# Patient Record
Sex: Female | Born: 1935 | Race: Black or African American | Hispanic: No | Smoking: Former smoker
Health system: Southern US, Community
[De-identification: ages and names within clinical notes are randomized; demographics above are authoritative.]

## PROBLEM LIST (undated history)

## (undated) DIAGNOSIS — I10 Essential (primary) hypertension: Secondary | ICD-10-CM

## (undated) DIAGNOSIS — J449 Chronic obstructive pulmonary disease, unspecified: Secondary | ICD-10-CM

## (undated) DIAGNOSIS — I4891 Unspecified atrial fibrillation: Secondary | ICD-10-CM

## (undated) DIAGNOSIS — I509 Heart failure, unspecified: Secondary | ICD-10-CM

## (undated) DIAGNOSIS — I639 Cerebral infarction, unspecified: Secondary | ICD-10-CM

## (undated) DIAGNOSIS — I219 Acute myocardial infarction, unspecified: Secondary | ICD-10-CM

## (undated) DIAGNOSIS — K219 Gastro-esophageal reflux disease without esophagitis: Secondary | ICD-10-CM

## (undated) DIAGNOSIS — N3090 Cystitis, unspecified without hematuria: Secondary | ICD-10-CM

## (undated) DIAGNOSIS — I999 Unspecified disorder of circulatory system: Secondary | ICD-10-CM

## (undated) DIAGNOSIS — K5792 Diverticulitis of intestine, part unspecified, without perforation or abscess without bleeding: Secondary | ICD-10-CM

## (undated) DIAGNOSIS — R0602 Shortness of breath: Secondary | ICD-10-CM

## (undated) DIAGNOSIS — I82409 Acute embolism and thrombosis of unspecified deep veins of unspecified lower extremity: Secondary | ICD-10-CM

## (undated) HISTORY — PX: OTHER SURGICAL HISTORY: SHX169

## (undated) HISTORY — PX: ABDOMINAL HYSTERECTOMY: SHX81

## (undated) HISTORY — PX: FEMORAL ARTERY STENT: SHX1583

## (undated) HISTORY — PX: ABDOMINAL SURGERY: SHX537

## (undated) HISTORY — PX: EYE SURGERY: SHX253

## (undated) HISTORY — PX: COLOSTOMY: SHX63

## (undated) HISTORY — PX: CATARACT EXTRACTION, BILATERAL: SHX1313

---

## 1997-09-06 ENCOUNTER — Other Ambulatory Visit: Admission: RE | Admit: 1997-09-06 | Discharge: 1997-09-06 | Payer: Self-pay | Admitting: Nephrology

## 1997-09-13 ENCOUNTER — Other Ambulatory Visit: Admission: RE | Admit: 1997-09-13 | Discharge: 1997-09-13 | Payer: Self-pay | Admitting: Nephrology

## 1997-10-02 ENCOUNTER — Other Ambulatory Visit: Admission: RE | Admit: 1997-10-02 | Discharge: 1997-10-02 | Payer: Self-pay | Admitting: Nephrology

## 1997-11-08 ENCOUNTER — Other Ambulatory Visit: Admission: RE | Admit: 1997-11-08 | Discharge: 1997-11-08 | Payer: Self-pay | Admitting: Internal Medicine

## 1998-11-26 ENCOUNTER — Other Ambulatory Visit: Admission: RE | Admit: 1998-11-26 | Discharge: 1998-11-26 | Payer: Self-pay | Admitting: Internal Medicine

## 1999-11-28 ENCOUNTER — Emergency Department (HOSPITAL_COMMUNITY): Admission: EM | Admit: 1999-11-28 | Discharge: 1999-11-28 | Payer: Self-pay | Admitting: Emergency Medicine

## 1999-11-28 ENCOUNTER — Encounter: Payer: Self-pay | Admitting: Emergency Medicine

## 1999-12-05 ENCOUNTER — Emergency Department (HOSPITAL_COMMUNITY): Admission: EM | Admit: 1999-12-05 | Discharge: 1999-12-05 | Payer: Self-pay | Admitting: Emergency Medicine

## 1999-12-05 ENCOUNTER — Encounter: Payer: Self-pay | Admitting: Emergency Medicine

## 1999-12-15 ENCOUNTER — Encounter: Payer: Self-pay | Admitting: Cardiovascular Disease

## 1999-12-15 ENCOUNTER — Ambulatory Visit (HOSPITAL_COMMUNITY): Admission: RE | Admit: 1999-12-15 | Discharge: 1999-12-15 | Payer: Self-pay | Admitting: Cardiovascular Disease

## 2000-02-25 ENCOUNTER — Encounter: Payer: Self-pay | Admitting: Internal Medicine

## 2000-02-25 ENCOUNTER — Encounter: Admission: RE | Admit: 2000-02-25 | Discharge: 2000-02-25 | Payer: Self-pay | Admitting: Internal Medicine

## 2000-03-31 ENCOUNTER — Other Ambulatory Visit: Admission: RE | Admit: 2000-03-31 | Discharge: 2000-03-31 | Payer: Self-pay | Admitting: Internal Medicine

## 2000-04-24 ENCOUNTER — Encounter: Payer: Self-pay | Admitting: Emergency Medicine

## 2000-04-24 ENCOUNTER — Inpatient Hospital Stay (HOSPITAL_COMMUNITY): Admission: EM | Admit: 2000-04-24 | Discharge: 2000-04-29 | Payer: Self-pay | Admitting: Emergency Medicine

## 2000-04-26 ENCOUNTER — Encounter: Payer: Self-pay | Admitting: Nephrology

## 2000-04-27 ENCOUNTER — Encounter: Payer: Self-pay | Admitting: Internal Medicine

## 2000-12-07 ENCOUNTER — Ambulatory Visit (HOSPITAL_COMMUNITY): Admission: RE | Admit: 2000-12-07 | Discharge: 2000-12-07 | Payer: Self-pay | Admitting: Ophthalmology

## 2001-03-01 ENCOUNTER — Encounter: Payer: Self-pay | Admitting: Internal Medicine

## 2001-03-01 ENCOUNTER — Encounter: Admission: RE | Admit: 2001-03-01 | Discharge: 2001-03-01 | Payer: Self-pay | Admitting: Internal Medicine

## 2001-05-03 ENCOUNTER — Other Ambulatory Visit: Admission: RE | Admit: 2001-05-03 | Discharge: 2001-05-03 | Payer: Self-pay | Admitting: Internal Medicine

## 2001-10-26 ENCOUNTER — Ambulatory Visit (HOSPITAL_COMMUNITY): Admission: RE | Admit: 2001-10-26 | Discharge: 2001-10-26 | Payer: Self-pay | Admitting: Cardiovascular Disease

## 2002-02-14 ENCOUNTER — Other Ambulatory Visit: Admission: RE | Admit: 2002-02-14 | Discharge: 2002-02-14 | Payer: Self-pay | Admitting: Internal Medicine

## 2002-03-06 ENCOUNTER — Encounter: Admission: RE | Admit: 2002-03-06 | Discharge: 2002-03-06 | Payer: Self-pay | Admitting: Internal Medicine

## 2002-03-06 ENCOUNTER — Encounter: Payer: Self-pay | Admitting: Internal Medicine

## 2003-03-21 ENCOUNTER — Other Ambulatory Visit: Admission: RE | Admit: 2003-03-21 | Discharge: 2003-03-21 | Payer: Self-pay | Admitting: Internal Medicine

## 2003-03-26 ENCOUNTER — Encounter: Admission: RE | Admit: 2003-03-26 | Discharge: 2003-03-26 | Payer: Self-pay | Admitting: Internal Medicine

## 2003-07-08 ENCOUNTER — Emergency Department (HOSPITAL_COMMUNITY): Admission: EM | Admit: 2003-07-08 | Discharge: 2003-07-08 | Payer: Self-pay | Admitting: *Deleted

## 2004-03-05 ENCOUNTER — Emergency Department (HOSPITAL_COMMUNITY): Admission: EM | Admit: 2004-03-05 | Discharge: 2004-03-05 | Payer: Self-pay | Admitting: Emergency Medicine

## 2004-03-26 ENCOUNTER — Encounter: Admission: RE | Admit: 2004-03-26 | Discharge: 2004-03-26 | Payer: Self-pay | Admitting: Internal Medicine

## 2004-04-04 ENCOUNTER — Encounter: Admission: RE | Admit: 2004-04-04 | Discharge: 2004-04-04 | Payer: Self-pay | Admitting: Internal Medicine

## 2004-08-13 ENCOUNTER — Inpatient Hospital Stay (HOSPITAL_COMMUNITY): Admission: EM | Admit: 2004-08-13 | Discharge: 2004-08-15 | Payer: Self-pay | Admitting: *Deleted

## 2004-09-26 ENCOUNTER — Encounter: Admission: RE | Admit: 2004-09-26 | Discharge: 2004-09-26 | Payer: Self-pay | Admitting: Internal Medicine

## 2005-04-10 ENCOUNTER — Encounter: Admission: RE | Admit: 2005-04-10 | Discharge: 2005-04-10 | Payer: Self-pay | Admitting: Internal Medicine

## 2005-06-15 ENCOUNTER — Encounter: Admission: RE | Admit: 2005-06-15 | Discharge: 2005-06-15 | Payer: Self-pay | Admitting: Internal Medicine

## 2006-04-16 ENCOUNTER — Encounter: Admission: RE | Admit: 2006-04-16 | Discharge: 2006-04-16 | Payer: Self-pay | Admitting: Internal Medicine

## 2006-11-22 ENCOUNTER — Ambulatory Visit (HOSPITAL_COMMUNITY): Admission: RE | Admit: 2006-11-22 | Discharge: 2006-11-22 | Payer: Self-pay | Admitting: Internal Medicine

## 2007-05-31 ENCOUNTER — Encounter: Admission: RE | Admit: 2007-05-31 | Discharge: 2007-05-31 | Payer: Self-pay | Admitting: Internal Medicine

## 2007-06-09 ENCOUNTER — Inpatient Hospital Stay (HOSPITAL_COMMUNITY): Admission: EM | Admit: 2007-06-09 | Discharge: 2007-06-20 | Payer: Self-pay | Admitting: Emergency Medicine

## 2007-06-10 ENCOUNTER — Ambulatory Visit: Payer: Self-pay | Admitting: Vascular Surgery

## 2007-06-10 ENCOUNTER — Encounter: Payer: Self-pay | Admitting: Gynecology

## 2007-06-10 ENCOUNTER — Encounter: Payer: Self-pay | Admitting: Internal Medicine

## 2007-06-30 ENCOUNTER — Inpatient Hospital Stay (HOSPITAL_COMMUNITY): Admission: EM | Admit: 2007-06-30 | Discharge: 2007-07-11 | Payer: Self-pay | Admitting: Emergency Medicine

## 2007-06-30 ENCOUNTER — Ambulatory Visit: Payer: Self-pay | Admitting: Oncology

## 2007-07-05 ENCOUNTER — Encounter: Payer: Self-pay | Admitting: Internal Medicine

## 2007-07-22 ENCOUNTER — Ambulatory Visit: Payer: Self-pay | Admitting: Oncology

## 2007-08-19 LAB — CBC WITH DIFFERENTIAL/PLATELET
Basophils Absolute: 0 10*3/uL (ref 0.0–0.1)
Eosinophils Absolute: 0.1 10*3/uL (ref 0.0–0.5)
HCT: 36.2 % (ref 34.8–46.6)
HGB: 12.1 g/dL (ref 11.6–15.9)
LYMPH%: 24.5 % (ref 14.0–48.0)
MCH: 28.5 pg (ref 26.0–34.0)
MCV: 85.7 fL (ref 81.0–101.0)
MONO%: 6.8 % (ref 0.0–13.0)
NEUT#: 4.5 10*3/uL (ref 1.5–6.5)
NEUT%: 66.7 % (ref 39.6–76.8)
Platelets: 222 10*3/uL (ref 145–400)
RDW: 15.3 % — ABNORMAL HIGH (ref 11.3–14.5)

## 2007-08-19 LAB — PROTHROMBIN TIME: INR: 0.9 (ref 0.0–1.5)

## 2007-08-26 LAB — PROTIME-INR: INR: 1 — ABNORMAL LOW (ref 2.00–3.50)

## 2007-09-02 ENCOUNTER — Ambulatory Visit: Payer: Self-pay | Admitting: Oncology

## 2007-09-02 LAB — CBC WITH DIFFERENTIAL/PLATELET
BASO%: 1 % (ref 0.0–2.0)
Basophils Absolute: 0.1 10*3/uL (ref 0.0–0.1)
EOS%: 3 % (ref 0.0–7.0)
HGB: 12.9 g/dL (ref 11.6–15.9)
MCH: 29.1 pg (ref 26.0–34.0)
MONO#: 0.7 10*3/uL (ref 0.1–0.9)
RDW: 14.5 % (ref 11.3–14.5)
WBC: 8.2 10*3/uL (ref 3.9–10.0)
lymph#: 2.4 10*3/uL (ref 0.9–3.3)

## 2007-09-02 LAB — PROTIME-INR

## 2007-09-12 LAB — PROTIME-INR: Protime: 19.2 Seconds — ABNORMAL HIGH (ref 10.6–13.4)

## 2007-09-16 LAB — PROTIME-INR
INR: 2.3 (ref 2.00–3.50)
Protime: 27.6 Seconds — ABNORMAL HIGH (ref 10.6–13.4)

## 2007-09-20 LAB — PROTIME-INR
INR: 3.2 (ref 2.00–3.50)
Protime: 38.4 Seconds — ABNORMAL HIGH (ref 10.6–13.4)

## 2007-10-14 ENCOUNTER — Ambulatory Visit: Payer: Self-pay | Admitting: Oncology

## 2007-11-21 LAB — CBC WITH DIFFERENTIAL/PLATELET
BASO%: 1.4 % (ref 0.0–2.0)
EOS%: 2.6 % (ref 0.0–7.0)
HCT: 38.1 % (ref 34.8–46.6)
LYMPH%: 28 % (ref 14.0–48.0)
MCH: 27.8 pg (ref 26.0–34.0)
MCHC: 33.4 g/dL (ref 32.0–36.0)
MCV: 83.2 fL (ref 81.0–101.0)
MONO%: 7.2 % (ref 0.0–13.0)
NEUT%: 60.8 % (ref 39.6–76.8)
Platelets: 184 10*3/uL (ref 145–400)

## 2007-12-20 ENCOUNTER — Ambulatory Visit: Payer: Self-pay | Admitting: Oncology

## 2007-12-22 LAB — PROTIME-INR: INR: 2.1 (ref 2.00–3.50)

## 2008-01-24 LAB — PROTIME-INR: Protime: 16.8 Seconds — ABNORMAL HIGH (ref 10.6–13.4)

## 2008-02-07 ENCOUNTER — Ambulatory Visit: Payer: Self-pay | Admitting: Oncology

## 2008-02-07 LAB — PROTIME-INR
INR: 1.6 — ABNORMAL LOW (ref 2.00–3.50)
Protime: 19.2 Seconds — ABNORMAL HIGH (ref 10.6–13.4)

## 2008-02-14 LAB — PROTIME-INR
INR: 1.7 — ABNORMAL LOW (ref 2.00–3.50)
Protime: 20.4 s — ABNORMAL HIGH (ref 10.6–13.4)

## 2008-02-28 LAB — CBC WITH DIFFERENTIAL/PLATELET
Basophils Absolute: 0.1 10*3/uL (ref 0.0–0.1)
Eosinophils Absolute: 0.2 10*3/uL (ref 0.0–0.5)
HCT: 36.5 % (ref 34.8–46.6)
HGB: 12.1 g/dL (ref 11.6–15.9)
LYMPH%: 29 % (ref 14.0–48.0)
MCH: 26.9 pg (ref 26.0–34.0)
MCV: 80.8 fL — ABNORMAL LOW (ref 81.0–101.0)
MONO%: 8.6 % (ref 0.0–13.0)
NEUT#: 4.3 10*3/uL (ref 1.5–6.5)
NEUT%: 58.4 % (ref 39.6–76.8)
Platelets: 167 10*3/uL (ref 145–400)
RDW: 14.1 % (ref 11.3–14.5)

## 2008-02-28 LAB — PROTIME-INR: INR: 2.8 (ref 2.00–3.50)

## 2008-03-13 LAB — PROTIME-INR: INR: 3.4 (ref 2.00–3.50)

## 2008-04-06 ENCOUNTER — Ambulatory Visit: Payer: Self-pay | Admitting: Oncology

## 2008-04-10 LAB — CBC WITH DIFFERENTIAL/PLATELET
Basophils Absolute: 0.1 10*3/uL (ref 0.0–0.1)
EOS%: 2.6 % (ref 0.0–7.0)
Eosinophils Absolute: 0.2 10*3/uL (ref 0.0–0.5)
HGB: 12 g/dL (ref 11.6–15.9)
LYMPH%: 26.1 % (ref 14.0–48.0)
MCH: 26.8 pg (ref 26.0–34.0)
MCV: 80.8 fL — ABNORMAL LOW (ref 81.0–101.0)
MONO%: 7.3 % (ref 0.0–13.0)
NEUT#: 4.8 10*3/uL (ref 1.5–6.5)
Platelets: 189 10*3/uL (ref 145–400)

## 2008-05-17 ENCOUNTER — Ambulatory Visit: Payer: Self-pay | Admitting: Oncology

## 2008-05-22 LAB — CBC WITH DIFFERENTIAL/PLATELET
Basophils Absolute: 0 10*3/uL (ref 0.0–0.1)
EOS%: 2.7 % (ref 0.0–7.0)
Eosinophils Absolute: 0.2 10*3/uL (ref 0.0–0.5)
HCT: 35 % (ref 34.8–46.6)
MCH: 27.3 pg (ref 26.0–34.0)
MCV: 83.3 fL (ref 81.0–101.0)
MONO%: 7.2 % (ref 0.0–13.0)
NEUT%: 59.1 % (ref 39.6–76.8)
RDW: 16.4 % — ABNORMAL HIGH (ref 11.3–14.5)
WBC: 6.7 10*3/uL (ref 3.9–10.0)
lymph#: 2 10*3/uL (ref 0.9–3.3)

## 2008-05-22 LAB — PROTIME-INR: Protime: 44.4 Seconds — ABNORMAL HIGH (ref 10.6–13.4)

## 2008-06-28 ENCOUNTER — Encounter: Admission: RE | Admit: 2008-06-28 | Discharge: 2008-06-28 | Payer: Self-pay | Admitting: Internal Medicine

## 2008-07-06 ENCOUNTER — Ambulatory Visit: Payer: Self-pay | Admitting: Oncology

## 2008-07-10 LAB — CBC WITH DIFFERENTIAL/PLATELET
BASO%: 0.6 % (ref 0.0–2.0)
LYMPH%: 27.7 % (ref 14.0–49.7)
MCHC: 33.1 g/dL (ref 31.5–36.0)
MONO#: 0.4 10*3/uL (ref 0.1–0.9)
RBC: 4.49 10*6/uL (ref 3.70–5.45)
RDW: 16.1 % — ABNORMAL HIGH (ref 11.2–14.5)
WBC: 6.8 10*3/uL (ref 3.9–10.3)
lymph#: 1.9 10*3/uL (ref 0.9–3.3)

## 2008-07-10 LAB — PROTIME-INR
INR: 2 (ref 2.00–3.50)
Protime: 24 Seconds — ABNORMAL HIGH (ref 10.6–13.4)

## 2008-08-13 ENCOUNTER — Ambulatory Visit (HOSPITAL_COMMUNITY): Admission: RE | Admit: 2008-08-13 | Discharge: 2008-08-13 | Payer: Self-pay | Admitting: Internal Medicine

## 2008-08-15 LAB — PROTIME-INR

## 2008-08-31 ENCOUNTER — Ambulatory Visit: Payer: Self-pay | Admitting: Oncology

## 2008-09-04 LAB — CBC WITH DIFFERENTIAL/PLATELET
EOS%: 2.3 % (ref 0.0–7.0)
MCH: 27.5 pg (ref 25.1–34.0)
MCV: 84.1 fL (ref 79.5–101.0)
MONO%: 7.6 % (ref 0.0–14.0)
NEUT#: 4.7 10*3/uL (ref 1.5–6.5)
RBC: 4.66 10*6/uL (ref 3.70–5.45)
RDW: 15.3 % — ABNORMAL HIGH (ref 11.2–14.5)

## 2008-09-04 LAB — PROTIME-INR
INR: 2.5 (ref 2.00–3.50)
Protime: 30 Seconds — ABNORMAL HIGH (ref 10.6–13.4)

## 2008-09-12 IMAGING — CR DG CHEST 2V
1 series · 1 of 1 positions shown · non-contrast
Comparison: 06/09/07.

CLINICAL DATA: Dyspnea.  Weakness.  
 CHEST - 2 VIEW:

[view not recorded]
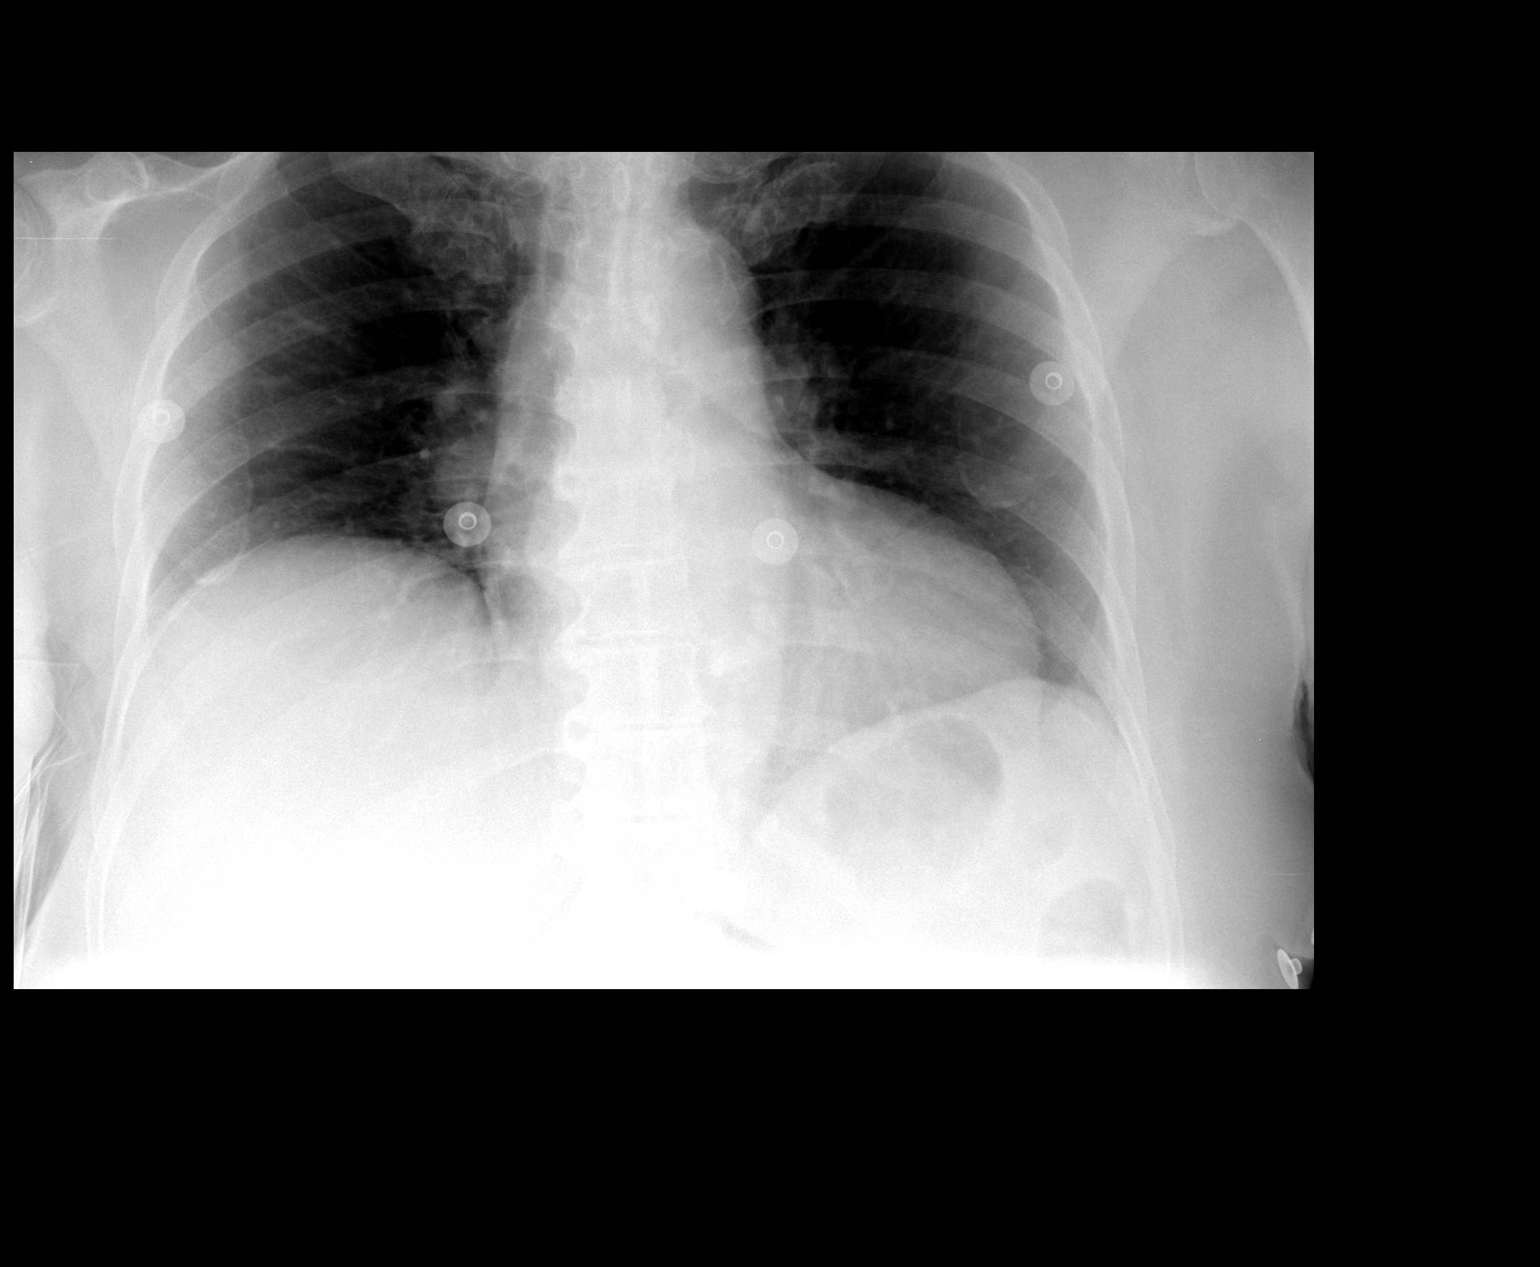

[1 of 1 positions shown; findings below may reference images not displayed]

FINDINGS: Mild cardiomegaly is again identified.  Elevation of the right hemidiaphragm and mild peribronchial thickening are stable.  There is no evidence of focal air space disease, pleural effusions, or pneumothorax.  Mild thoracic spondylosis is again identified.
IMPRESSION: No evidence of acute cardiopulmonary disease.

## 2008-11-07 ENCOUNTER — Other Ambulatory Visit: Admission: RE | Admit: 2008-11-07 | Discharge: 2008-11-07 | Payer: Self-pay | Admitting: Internal Medicine

## 2009-01-31 ENCOUNTER — Inpatient Hospital Stay (HOSPITAL_COMMUNITY): Admission: EM | Admit: 2009-01-31 | Discharge: 2009-02-01 | Payer: Self-pay | Admitting: Cardiovascular Disease

## 2009-04-12 ENCOUNTER — Ambulatory Visit: Payer: Self-pay | Admitting: Cardiovascular Disease

## 2009-04-12 ENCOUNTER — Inpatient Hospital Stay (HOSPITAL_COMMUNITY): Admission: EM | Admit: 2009-04-12 | Discharge: 2009-04-19 | Payer: Self-pay | Admitting: Emergency Medicine

## 2009-04-12 ENCOUNTER — Encounter (INDEPENDENT_AMBULATORY_CARE_PROVIDER_SITE_OTHER): Payer: Self-pay | Admitting: Internal Medicine

## 2009-04-30 ENCOUNTER — Inpatient Hospital Stay (HOSPITAL_COMMUNITY): Admission: EM | Admit: 2009-04-30 | Discharge: 2009-05-08 | Payer: Self-pay | Admitting: Emergency Medicine

## 2009-05-03 ENCOUNTER — Ambulatory Visit: Payer: Self-pay | Admitting: Thoracic Surgery (Cardiothoracic Vascular Surgery)

## 2009-06-04 ENCOUNTER — Encounter: Admission: RE | Admit: 2009-06-04 | Discharge: 2009-06-04 | Payer: Self-pay | Admitting: Cardiovascular Disease

## 2009-07-02 ENCOUNTER — Encounter (INDEPENDENT_AMBULATORY_CARE_PROVIDER_SITE_OTHER): Payer: Self-pay | Admitting: Cardiovascular Disease

## 2009-07-02 ENCOUNTER — Inpatient Hospital Stay (HOSPITAL_COMMUNITY): Admission: RE | Admit: 2009-07-02 | Discharge: 2009-07-10 | Payer: Self-pay | Admitting: Cardiovascular Disease

## 2009-07-02 ENCOUNTER — Ambulatory Visit: Payer: Self-pay | Admitting: Vascular Surgery

## 2009-07-13 ENCOUNTER — Inpatient Hospital Stay (HOSPITAL_COMMUNITY): Admission: EM | Admit: 2009-07-13 | Discharge: 2009-07-20 | Payer: Self-pay | Admitting: Emergency Medicine

## 2009-07-16 ENCOUNTER — Ambulatory Visit: Payer: Self-pay | Admitting: Surgery

## 2009-08-09 ENCOUNTER — Ambulatory Visit: Payer: Self-pay | Admitting: Oncology

## 2009-08-23 LAB — LACTATE DEHYDROGENASE: LDH: 160 U/L (ref 94–250)

## 2009-08-23 LAB — CBC WITH DIFFERENTIAL/PLATELET
BASO%: 0.4 % (ref 0.0–2.0)
EOS%: 3.5 % (ref 0.0–7.0)
HCT: 38.4 % (ref 34.8–46.6)
MCH: 28.4 pg (ref 25.1–34.0)
MCHC: 32.7 g/dL (ref 31.5–36.0)
MONO#: 0.7 10*3/uL (ref 0.1–0.9)
NEUT%: 73.1 % (ref 38.4–76.8)
RBC: 4.42 10*6/uL (ref 3.70–5.45)
WBC: 9.1 10*3/uL (ref 3.9–10.3)
lymph#: 1.4 10*3/uL (ref 0.9–3.3)

## 2009-08-23 LAB — COMPREHENSIVE METABOLIC PANEL
ALT: 22 U/L (ref 0–35)
Alkaline Phosphatase: 57 U/L (ref 39–117)
CO2: 24 mEq/L (ref 19–32)
Creatinine, Ser: 1.7 mg/dL — ABNORMAL HIGH (ref 0.40–1.20)
Glucose, Bld: 338 mg/dL — ABNORMAL HIGH (ref 70–99)
Total Bilirubin: 0.5 mg/dL (ref 0.3–1.2)

## 2009-08-23 LAB — PROTIME-INR: Protime: 12 Seconds (ref 10.6–13.4)

## 2009-10-06 ENCOUNTER — Inpatient Hospital Stay (HOSPITAL_COMMUNITY): Admission: EM | Admit: 2009-10-06 | Discharge: 2009-10-10 | Payer: Self-pay | Admitting: Emergency Medicine

## 2010-01-16 ENCOUNTER — Ambulatory Visit (HOSPITAL_COMMUNITY): Admission: RE | Admit: 2010-01-16 | Discharge: 2010-01-16 | Payer: Self-pay | Admitting: Internal Medicine

## 2010-02-03 ENCOUNTER — Inpatient Hospital Stay (HOSPITAL_COMMUNITY): Admission: EM | Admit: 2010-02-03 | Discharge: 2010-02-07 | Payer: Self-pay | Admitting: Emergency Medicine

## 2010-02-20 ENCOUNTER — Ambulatory Visit: Payer: Self-pay | Admitting: Oncology

## 2010-05-06 ENCOUNTER — Inpatient Hospital Stay (HOSPITAL_COMMUNITY)
Admission: EM | Admit: 2010-05-06 | Discharge: 2010-05-10 | Payer: Self-pay | Source: Home / Self Care | Attending: Internal Medicine | Admitting: Internal Medicine

## 2010-05-07 ENCOUNTER — Encounter (INDEPENDENT_AMBULATORY_CARE_PROVIDER_SITE_OTHER): Payer: Self-pay | Admitting: Internal Medicine

## 2010-06-10 ENCOUNTER — Emergency Department (HOSPITAL_COMMUNITY)
Admission: EM | Admit: 2010-06-10 | Discharge: 2010-06-10 | Payer: Self-pay | Source: Home / Self Care | Admitting: Emergency Medicine

## 2010-06-11 LAB — CBC
HCT: 26.7 % — ABNORMAL LOW (ref 36.0–46.0)
MCH: 26 pg (ref 26.0–34.0)
MCV: 85.9 fL (ref 78.0–100.0)
RBC: 3.11 MIL/uL — ABNORMAL LOW (ref 3.87–5.11)
RDW: 17.8 % — ABNORMAL HIGH (ref 11.5–15.5)
WBC: 8 10*3/uL (ref 4.0–10.5)

## 2010-06-11 LAB — URINALYSIS, ROUTINE W REFLEX MICROSCOPIC
Bilirubin Urine: NEGATIVE
Hgb urine dipstick: NEGATIVE
Ketones, ur: NEGATIVE mg/dL
Protein, ur: NEGATIVE mg/dL
Urine Glucose, Fasting: NEGATIVE mg/dL
Urobilinogen, UA: 0.2 mg/dL (ref 0.0–1.0)

## 2010-06-11 LAB — POCT CARDIAC MARKERS: Troponin i, poc: <0.05 ng/mL (ref 0.00–0.09)

## 2010-06-11 LAB — POCT I-STAT, CHEM 8
BUN: 43 mg/dL — ABNORMAL HIGH (ref 6–23)
Calcium, Ion: 1.06 mmol/L — ABNORMAL LOW (ref 1.12–1.32)
Chloride: 105 mEq/L (ref 96–112)
Glucose, Bld: 225 mg/dL — ABNORMAL HIGH (ref 70–99)
HCT: 27 % — ABNORMAL LOW (ref 36.0–46.0)
Potassium: 3.8 mEq/L (ref 3.5–5.1)

## 2010-06-11 LAB — URINE MICROSCOPIC-ADD ON

## 2010-06-11 LAB — DIFFERENTIAL
Eosinophils Relative: 6 % — ABNORMAL HIGH (ref 0–5)
Lymphocytes Relative: 10 % — ABNORMAL LOW (ref 12–46)
Lymphs Abs: 0.8 10*3/uL (ref 0.7–4.0)
Monocytes Relative: 7 % (ref 3–12)

## 2010-06-14 NOTE — H&P (Signed)
[name], [name]               ACCOUNT NO.:  [id number]  MEDICAL RECORD NO.:  [id number]          PATIENT TYPE:  INP  LOCATION:  1823                         FACILITY:  [facility]  PHYSICIAN:  Michiel Cowboy, MDDATE OF BIRTH:  12/08/1925  DATE OF ADMISSION:  05/06/2010 DATE OF DISCHARGE:                             HISTORY & PHYSICAL   PRIMARY CARE PHYSICIAN:  Dr. Vonita Moss.  CHIEF COMPLAINT:  Shortness of breath.  The patient is an 77 year old female with past medical history significant for recent hospitalization for heart failure and community- acquired pneumonia.  In September, she was discharged to home and was doing fairly well up until a couple of weeks ago when she started to have progressive worsening shortness of breath.  Her family admits that since Thanksgiving, she has been having a lot of dietary indiscretions and has been basically eating salt fairly freely.  She denies any chest pain.  She did have an occasional episode of neck pain, but she attributed to holding her head crooked to watch TV.  Her shortness of breath has become progressively worse to the point where she had a hard time communicating and finishing sentences, at which point she was brought into the emergency department, put on BiPAP, Nitro drip, and given IV Lasix, after which her breathing has improved.  She is currently more comfortable when she is resting; but when she is speaking, she is still somewhat short of breath.  REVIEW OF SYSTEMS:  No fevers, no chills.  No nausea, no vomiting.  No diarrhea.  No constipation.  Her lower extremity has been swelling a little bit but not severely.  Otherwise review of systems is negative.  PAST MEDICAL HISTORY: 1. Significant for poorly controlled diabetes. 2. Hypertension. 3. Heart failure with ejection fraction of 35% to 40%. 4. History of coronary artery disease. 5. History of DVT, now status post IVC filter. 6. History of gout. 7. Chronic  kidney insufficiency, baseline creatinine around 0.3.  She     had not been taking ACE inhibitor because of this.  SOCIAL HISTORY:  The patient does not smoke or drink and does not abuse drugs.  Quit smoking when she was 77 years old.  FAMILY HISTORY:  Noncontributory.  ALLERGIES:  PROTON PUMP INHIBITORS.  MEDICATIONS: 1. Allopurinol 100 mg daily. 2. Calcium, magnesium, zinc over-the-counter daily. 3. __________  mg daily. 4. Co-enzyme Q10 100 mg daily. 5. Combivent inhaler 2 puffs every 6 hours as needed. 6. Digoxin 0.125 mg every other day. 7. Famotidine 20 mg twice a day. 8. Geritol, which is iron and vitamin B complex 1 tablet daily. 9. Humulin 50/50.  She takes about 50 units at night and sometimes a     high dose in the morning, depending on what her sugars are like.     She takes anywhere between 50 to 70 units in the morning. 10.I caps over-the-counter 1 tablet twice daily. 11.Iron over-the-counter. 12.Lasix 40 mg twice a day.  She had been taking it regularly. 13.Lipitor 20 mg a day. 14.Metoprolol 25 mg daily. 15.__________ Over-the-counter at bedtime. 16.Omega-3 acid 1 capsule daily. 17.Tylenol as needed for  pain. 18.Aspirin 81 mg daily.  PHYSICAL EXAMINATION:  VITALS:  Temperature 98.6, blood pressure initially 165/70, now down to 135/80.  Pulse initially 114, now down to 87, respirations 18, saturating 89% on room air, and now 100% on 40% FIO2 by BiPAP. GENERAL:  The patient appears to be in no acute distress, resting comfortably. HEENT:  Head is nontraumatic.  Moist mucous membranes. LUNGS:  Crackles but mild at the bases bilaterally. HEART:  Regular rate and rhythm.  No murmurs appreciated. ABDOMEN:  Slightly distended but nontender, slightly obese. LOWER EXTREMITIES:  1+ edema bilaterally. NEUROLOGICALLY:  Grossly intact but not fully cooperative to exam. SKIN:  Clean, dry, intact.  LABS:  White blood cell count 9.1, hemoglobin 8.7, sodium 133,  potassium 4.4, creatinine 1.42, albumin 2.6.  Cardiac enzymes, myoglobin slightly low at [id number] but troponin within normal limits.  BNP elevated 793. Digoxin level 0.5.  UA unremarkable.  Chest x-ray showing early edema. ABG 7.446/37.3/48.  EKG showing sinus tachycardia, left axis deviation, but otherwise no significant changes from prior EKGs.  ASSESSMENT/PLAN:  This is an 77 year old female with likely congestive heart failure exacerbation progressive, secondary to dietary indiscretion. 1. Congestive heart failure.  Will admit, give IV Lasix.  Her chest x-     ray does not seem to show any severe congestive heart failure at     this point.  Will follow her clinically.  Since she did not have an     echocardiogram for a couple of years, will repeat to see if there     is worsening heart function or worsening mitral valve disease, as     she has a history of mitral valve stenosis.  Will cycle cardiac     enzymes and repeat serial EKGs.  For right now, rest on the BiPAP     until morning when can wean her off but will titrate off     nitroglycerin as her blood pressure has been going down. 2. Chronic kidney disease, stable.  In the past, the patient was not     taking ACE inhibitor because of chronic kidney disease. 3. Anemia.  Will follow.  At the time of discharge in September, her     hemoglobin was at the same level of 8.6; but back at the beginning     of her admission, it was 10.2, so she is probably somewhere around     her baseline currently. 4. Diabetes.  Will put her on Lantus while in house.  Will write for     diabetic coordinator for sliding scale insulin. 5. Prophylaxis, famotidine and Lovenox. 6. Shortness of breath likely explained by congestive heart failure.     The patient has a history of IVC filter, although this does not     completely exclude a pulmonary embolus, but she does not have a one-     sided swelling.  Her symptoms also were not sudden in onset but      rather gradual which makes pulmonary embolus somewhat less likely     as the cause of her shortness of breath.  We will also evaluate for     unstable angina.  CODE STATUS:  Do not resuscitate, do not intubate, according to patient's wishes, confirmed with her family.     Michiel Cowboy, MD     AVD/MEDQ  D:  05/06/2010  T:  05/07/2010  Job:  [id number]  cc:   Dr. Vonita Moss  Electronically Signed by Therisa Doyne MD on  06/14/2010 06:11:35 AM

## 2010-07-03 ENCOUNTER — Inpatient Hospital Stay (HOSPITAL_COMMUNITY): Payer: Medicare Other

## 2010-07-03 ENCOUNTER — Emergency Department (HOSPITAL_COMMUNITY): Payer: Medicare Other

## 2010-07-03 ENCOUNTER — Inpatient Hospital Stay (HOSPITAL_COMMUNITY)
Admission: EM | Admit: 2010-07-03 | Discharge: 2010-07-11 | DRG: 280 | Disposition: A | Discharge status: Home Health | Payer: Medicare Other | Source: Ambulatory Visit | Attending: Cardiovascular Disease | Admitting: Cardiovascular Disease

## 2010-07-03 DIAGNOSIS — Z87891 Personal history of nicotine dependence: Secondary | ICD-10-CM

## 2010-07-03 DIAGNOSIS — K5900 Constipation, unspecified: Secondary | ICD-10-CM | POA: Diagnosis not present

## 2010-07-03 DIAGNOSIS — Z8673 Personal history of transient ischemic attack (TIA), and cerebral infarction without residual deficits: Secondary | ICD-10-CM

## 2010-07-03 DIAGNOSIS — Z66 Do not resuscitate: Secondary | ICD-10-CM | POA: Diagnosis present

## 2010-07-03 DIAGNOSIS — N183 Chronic kidney disease, stage 3 unspecified: Secondary | ICD-10-CM | POA: Diagnosis present

## 2010-07-03 DIAGNOSIS — I059 Rheumatic mitral valve disease, unspecified: Secondary | ICD-10-CM | POA: Diagnosis present

## 2010-07-03 DIAGNOSIS — J069 Acute upper respiratory infection, unspecified: Secondary | ICD-10-CM | POA: Diagnosis present

## 2010-07-03 DIAGNOSIS — E875 Hyperkalemia: Secondary | ICD-10-CM | POA: Diagnosis present

## 2010-07-03 DIAGNOSIS — J4489 Other specified chronic obstructive pulmonary disease: Secondary | ICD-10-CM | POA: Diagnosis present

## 2010-07-03 DIAGNOSIS — J9600 Acute respiratory failure, unspecified whether with hypoxia or hypercapnia: Secondary | ICD-10-CM | POA: Diagnosis present

## 2010-07-03 DIAGNOSIS — J449 Chronic obstructive pulmonary disease, unspecified: Secondary | ICD-10-CM | POA: Diagnosis present

## 2010-07-03 DIAGNOSIS — D638 Anemia in other chronic diseases classified elsewhere: Secondary | ICD-10-CM | POA: Diagnosis present

## 2010-07-03 DIAGNOSIS — I509 Heart failure, unspecified: Secondary | ICD-10-CM | POA: Diagnosis present

## 2010-07-03 DIAGNOSIS — I129 Hypertensive chronic kidney disease with stage 1 through stage 4 chronic kidney disease, or unspecified chronic kidney disease: Secondary | ICD-10-CM | POA: Diagnosis present

## 2010-07-03 DIAGNOSIS — I5023 Acute on chronic systolic (congestive) heart failure: Principal | ICD-10-CM | POA: Diagnosis present

## 2010-07-03 DIAGNOSIS — E119 Type 2 diabetes mellitus without complications: Secondary | ICD-10-CM | POA: Diagnosis present

## 2010-07-03 DIAGNOSIS — I214 Non-ST elevation (NSTEMI) myocardial infarction: Secondary | ICD-10-CM | POA: Diagnosis present

## 2010-07-03 LAB — DIFFERENTIAL
Basophils Absolute: 0.1 10*3/uL (ref 0.0–0.1)
Basophils Absolute: 0 10*3/uL (ref 0.0–0.1)
Basophils Relative: 0 % (ref 0–1)
Lymphocytes Relative: 11 % — ABNORMAL LOW (ref 12–46)
Lymphocytes Relative: 8 % — ABNORMAL LOW (ref 12–46)
Monocytes Absolute: 0.5 10*3/uL (ref 0.1–1.0)
Monocytes Absolute: 0.9 10*3/uL (ref 0.1–1.0)
Neutro Abs: 8.3 10*3/uL — ABNORMAL HIGH (ref 1.7–7.7)
Neutro Abs: 9 10*3/uL — ABNORMAL HIGH (ref 1.7–7.7)
Neutrophils Relative %: 83 % — ABNORMAL HIGH (ref 43–77)
Neutrophils Relative %: 83 % — ABNORMAL HIGH (ref 43–77)

## 2010-07-03 LAB — BRAIN NATRIURETIC PEPTIDE: Pro B Natriuretic peptide (BNP): 131 pg/mL — ABNORMAL HIGH (ref 0.0–100.0)

## 2010-07-03 LAB — DIGOXIN LEVEL: Digoxin Level: <0.2 ng/mL — ABNORMAL LOW (ref 0.8–2.0)

## 2010-07-03 LAB — URINALYSIS, ROUTINE W REFLEX MICROSCOPIC
Bilirubin Urine: NEGATIVE
Bilirubin Urine: NEGATIVE
Hgb urine dipstick: NEGATIVE
Ketones, ur: NEGATIVE mg/dL
Nitrite: NEGATIVE
Specific Gravity, Urine: 1.011 (ref 1.005–1.030)
Specific Gravity, Urine: 1.011 (ref 1.005–1.030)
Urine Glucose, Fasting: NEGATIVE mg/dL
Urobilinogen, UA: 0.2 mg/dL (ref 0.0–1.0)
pH: 5 (ref 5.0–8.0)
pH: 5 (ref 5.0–8.0)

## 2010-07-03 LAB — CBC
HCT: 28.3 % — ABNORMAL LOW (ref 36.0–46.0)
HCT: 26.2 % — ABNORMAL LOW (ref 36.0–46.0)
Hemoglobin: 8.7 g/dL — ABNORMAL LOW (ref 12.0–15.0)
Hemoglobin: 8 g/dL — ABNORMAL LOW (ref 12.0–15.0)
MCHC: 30.7 g/dL (ref 30.0–36.0)
RBC: 3.4 MIL/uL — ABNORMAL LOW (ref 3.87–5.11)
RBC: 3.14 MIL/uL — ABNORMAL LOW (ref 3.87–5.11)
WBC: 9.9 10*3/uL (ref 4.0–10.5)
WBC: 10.8 10*3/uL — ABNORMAL HIGH (ref 4.0–10.5)

## 2010-07-03 LAB — BASIC METABOLIC PANEL
CO2: 19 mEq/L (ref 19–32)
Calcium: 8.7 mg/dL (ref 8.4–10.5)
Chloride: 94 mEq/L — ABNORMAL LOW (ref 96–112)
GFR calc Af Amer: 30 mL/min — ABNORMAL LOW (ref >60)
Glucose, Bld: 103 mg/dL — ABNORMAL HIGH (ref 70–99)
Potassium: 5 mEq/L (ref 3.5–5.1)
Sodium: 130 mEq/L — ABNORMAL LOW (ref 135–145)

## 2010-07-03 LAB — COMPREHENSIVE METABOLIC PANEL
ALT: 42 U/L — ABNORMAL HIGH (ref 0–35)
AST: 46 U/L — ABNORMAL HIGH (ref 0–37)
Alkaline Phosphatase: 86 U/L (ref 39–117)
CO2: 21 mEq/L (ref 19–32)
Chloride: 90 mEq/L — ABNORMAL LOW (ref 96–112)
GFR calc non Af Amer: 38 mL/min — ABNORMAL LOW (ref >60)
Glucose, Bld: 118 mg/dL — ABNORMAL HIGH (ref 70–99)
Potassium: 3.8 mEq/L (ref 3.5–5.1)
Sodium: 130 mEq/L — ABNORMAL LOW (ref 135–145)
Total Bilirubin: 0.8 mg/dL (ref 0.3–1.2)

## 2010-07-03 LAB — MRSA PCR SCREENING: MRSA by PCR: NEGATIVE

## 2010-07-03 LAB — CK TOTAL AND CKMB (NOT AT ARMC)
CK, MB: 4.7 ng/mL — ABNORMAL HIGH (ref 0.3–4.0)
Relative Index: 3.9 — ABNORMAL HIGH (ref 0.0–2.5)
Total CK: 119 U/L (ref 7–177)

## 2010-07-03 LAB — POCT CARDIAC MARKERS: CKMB, poc: 1.9 ng/mL (ref 1.0–8.0)

## 2010-07-03 LAB — CARDIAC PANEL(CRET KIN+CKTOT+MB+TROPI)
CK, MB: 4.9 ng/mL — ABNORMAL HIGH (ref 0.3–4.0)
Total CK: 113 U/L (ref 7–177)

## 2010-07-04 ENCOUNTER — Inpatient Hospital Stay (HOSPITAL_COMMUNITY): Payer: Medicare Other

## 2010-07-04 LAB — GLUCOSE, CAPILLARY
Glucose-Capillary: 112 mg/dL — ABNORMAL HIGH (ref 70–99)
Glucose-Capillary: 151 mg/dL — ABNORMAL HIGH (ref 70–99)
Glucose-Capillary: 284 mg/dL — ABNORMAL HIGH (ref 70–99)

## 2010-07-04 LAB — BASIC METABOLIC PANEL
BUN: 46 mg/dL — ABNORMAL HIGH (ref 6–23)
Calcium: 8.5 mg/dL (ref 8.4–10.5)
Creatinine, Ser: 1.89 mg/dL — ABNORMAL HIGH (ref 0.4–1.2)
GFR calc non Af Amer: 25 mL/min — ABNORMAL LOW (ref >60)
Glucose, Bld: 130 mg/dL — ABNORMAL HIGH (ref 70–99)

## 2010-07-04 LAB — CARDIAC PANEL(CRET KIN+CKTOT+MB+TROPI)
CK, MB: 5.1 ng/mL — ABNORMAL HIGH (ref 0.3–4.0)
CK, MB: 6.4 ng/mL (ref 0.3–4.0)
Relative Index: INVALID (ref 0.0–2.5)
Relative Index: 5.4 — ABNORMAL HIGH (ref 0.0–2.5)
Relative Index: 5.2 — ABNORMAL HIGH (ref 0.0–2.5)
Total CK: 48 U/L (ref 7–177)
Troponin I: 0.13 ng/mL — ABNORMAL HIGH (ref 0.00–0.06)
Troponin I: 0.31 ng/mL — ABNORMAL HIGH (ref 0.00–0.06)

## 2010-07-04 LAB — CBC
MCHC: 31.5 g/dL (ref 30.0–36.0)
Platelets: 209 10*3/uL (ref 150–400)
RDW: 19.2 % — ABNORMAL HIGH (ref 11.5–15.5)
WBC: 9.4 10*3/uL (ref 4.0–10.5)

## 2010-07-04 LAB — BRAIN NATRIURETIC PEPTIDE: Pro B Natriuretic peptide (BNP): 1372 pg/mL — ABNORMAL HIGH (ref 0.0–100.0)

## 2010-07-05 LAB — GLUCOSE, CAPILLARY
Glucose-Capillary: 279 mg/dL — ABNORMAL HIGH (ref 70–99)
Glucose-Capillary: 338 mg/dL — ABNORMAL HIGH (ref 70–99)
Glucose-Capillary: 360 mg/dL — ABNORMAL HIGH (ref 70–99)

## 2010-07-05 LAB — BASIC METABOLIC PANEL
CO2: 24 mEq/L (ref 19–32)
Calcium: 8.4 mg/dL (ref 8.4–10.5)
Creatinine, Ser: 1.9 mg/dL — ABNORMAL HIGH (ref 0.4–1.2)
GFR calc Af Amer: 31 mL/min — ABNORMAL LOW (ref >60)
GFR calc non Af Amer: 25 mL/min — ABNORMAL LOW (ref >60)

## 2010-07-06 LAB — GLUCOSE, CAPILLARY
Glucose-Capillary: 313 mg/dL — ABNORMAL HIGH (ref 70–99)
Glucose-Capillary: 309 mg/dL — ABNORMAL HIGH (ref 70–99)
Glucose-Capillary: 352 mg/dL — ABNORMAL HIGH (ref 70–99)
Glucose-Capillary: 376 mg/dL — ABNORMAL HIGH (ref 70–99)

## 2010-07-07 LAB — GLUCOSE, CAPILLARY: Glucose-Capillary: 252 mg/dL — ABNORMAL HIGH (ref 70–99)

## 2010-07-07 LAB — BASIC METABOLIC PANEL
BUN: 51 mg/dL — ABNORMAL HIGH (ref 6–23)
CO2: 28 mEq/L (ref 19–32)
Calcium: 8.2 mg/dL — ABNORMAL LOW (ref 8.4–10.5)
Creatinine, Ser: 1.51 mg/dL — ABNORMAL HIGH (ref 0.4–1.2)
GFR calc Af Amer: 40 mL/min — ABNORMAL LOW (ref >60)
Glucose, Bld: 218 mg/dL — ABNORMAL HIGH (ref 70–99)

## 2010-07-07 NOTE — H&P (Signed)
[name], [name] NO.:  [id number]  MEDICAL RECORD NO.:  [id number]           PATIENT TYPE:  E  LOCATION:  [facility]                         FACILITY:  [facility]  PHYSICIAN:  Jeoffrey Massed, MD    DATE OF BIRTH:  04/02/1926  DATE OF ADMISSION:  07/03/2010 DATE OF DISCHARGE:                             HISTORY & PHYSICAL   PRIMARY CARE PRACTITIONER:  Margaretmary Bayley, MD  CARDIOLOGIST:  Ricki Rodriguez, MD  CHIEF COMPLAINT:  Shortness of breath and ankle swelling.  HISTORY OF PRESENT ILLNESS:  The patient is an 77 year old black female with a past medical history of chronic systolic heart failure, COPD, chronic kidney disease stage III, diabetes, hypertension brought into the ED earlier this morning for shortness of breath.  Please note that this patient currently is on BiPAP and most of the history is obtained from her daughter who is currently at bedside.  The patient is able to say a few words and she shake the head yes and no as well.  Per daughter, the patient was having bilateral ankle swelling and lower leg edema over the past few days and had gone to her primary care practitioner yesterday and was told to double the dose of Lasix.  She usually takes 40 mg twice daily.  The daughter gave her 80 mg twice daily yesterday and also 80 mg dose earlier this morning before coming to the hospital.  Her primary care practitioner also did tell the daughter that if the oral Lasix does not work, she might need to be hospitalized for further diuresis with intravenous Lasix as well.  In any event early this morning, the patient woke up short of breath.  Per daughter, she had wheezing.  She was also feeling feverish and a cough with mostly whitish yellowish phlegm.  The daughter also felt that the patient did have some mild shortness of breath yesterday and was mostly exertional.  Last night she was mostly sitting in the chair and did require around 2-3 pillows to sleep  and lie down.  Since she developed shortness of breath this morning, she was brought here to the emergency room for further evaluation and during the course of evaluating in the emergency room, she has been placed on BiPAP.  Per her nurse, a Foley catheter was inserted less than an hour ago and already has 1 liter of output already.  The patient denies any headaches, blurry vision, slurry speech.  The patient denies any chest pain or palpitations.  The patient denies any abdominal pain or diarrhea.  She is nauseous but has not vomited.  There is no history of rash.  There is no history of any urinary incontinence or dysuria.  ALLERGIES:  The patient is apparently allergic to PROTON PUMP INHIBITORS.  PAST MEDICAL HISTORY: 1. Significant for CHF with systolic heart failure with an EF of     around 35-40% per echo done on May 07, 2010. 2. The patient has moderate mitral valve stenosis. 3. Chronic kidney disease stage III. 4. Hypertension. 5. Diabetes. 6. Anemia of chronic disease. 7. History of coronary artery disease. 8. History  of DVT status post IVC filter. 9. History of GI bleed in February 2011. 10.COPD. 11.Dyslipidemia. 12.Prior history of CVA.  MEDICATIONS AT HOME:  Currently unknown but the patient apparently is taking insulin and beta-blocker.  The daughter will bring [place] the list of medication as soon as she is able to leave the hospital.  PAST SURGICAL HISTORY:  None.  FAMILY HISTORY:  Noncontributory.  SOCIAL HISTORY:  The patient lives with her daughter.  She quit smoking when she was in her early 30s.  There is no history of any toxic habits.  REVIEW OF SYSTEMS:  A detailed review of 12 systems were done and these are negative except for the ones mentioned in the HPI.  PHYSICAL EXAMINATION:  VITAL SIGNS:  Last set of vital signs shows a heart rate of 83, blood pressure 146/79, a pulse ox of 100%. GENERAL:  The patient lying in bed, looks comfortable,  currently has BiPAP on. HEENT:  Atraumatic, normocephalic.  Pupils are equally react to light and accommodation. NECK:  Supple.  No JVD was seen. CHEST:  Bibasilar rales with good air entry.  There is some scattered wheezing as well. CARDIOVASCULAR:  Heart sounds are regular around 2/6 systolic murmur, best heard in the apical area. ABDOMEN:  Soft, nontender, nondistended.  There is good bowel sounds. EXTREMITIES:  There is around 2 to 3+ pitting edema in the bilateral lower extremities.  There is no evidence of clubbing or cyanosis. NEUROLOGIC:  The patient is alert and awake, however, is on the BiPAP. Speech to me looks clear.  She has a nonfocal exam.  LABORATORY DATA: 1. CBC shows a WBC of 9.9, hemoglobin of 8.7, hematocrit of 28.3, and     a platelet count of 268,000. 2. Fecal occult blood is negative. 3. Point-of-care cardiac markers are negative. 4. Chemistry shows a sodium of 130, potassium of 3.8, chloride of 90,     bicarb of 21, glucose of 118, BUN of 29, creatinine of 1.33. 5. Liver function test shows a total bilirubin of 0.8, alkaline     phosphatase of 86, AST of 46 ALT of 42, total protein of 7.6,     albumin of 3.3, and a calcium of 8.9. 6. BNP is 131. 7. INR is 1.15.  RADIOLOGICAL STUDIES:  A portable chest x-ray shows cardiomegaly and pulmonary edema.  ASSESSMENT: 1. Acute respiratory failure with requirement of BiPAP. 2. Congestive heart failure, systolic decompensation. 3. Bilateral leg swelling likely secondary to congestive heart     failure. 4. Questionable upper respiratory tract infection.  PLAN: 1. Since the patient still is on BiPAP, she will be admitted to the     step-down unit for now.  Revaluation will be done since the patient     has already put out 1 liter of urine approximately and if no     further requirements of BiPAP are needed, then she will probably be     downgraded to telemetry unit. 2. She will be put on IV Lasix, Lopressor.  We  will get a digoxin     level before resuming that. 3. She will also be getting empiric Avelox for now. 4. She will also be started on scheduled bronchodilators for now. 5. Given the history of chronic kidney disease, we will avoid using     ACE inhibitor. 6. We will resume her home medications once we have verified that with     the patient's daughter and when she brings [place] the list of  medications and also once it has been verified by the pharmacy. 7. In regards to her diabetes, we will put on a more sensitive sliding     scale for now and once her insulin regimen is verified, we will     resume that. 8. Strict Is and Os and daily weights will be checked. 9. Further plan will depend as the patient's clinical course evolves. 10.A detailed discussion was done with the patient's daughter at     bedside.  She wishes the patient to be a do not resuscitate.  We     will honor that request as well. 11.DVT prophylaxis with Lovenox.  Total time spent equals 60 minutes.     Jeoffrey Massed, MD     SG/MEDQ  D:  07/03/2010  T:  07/03/2010  Job:  [id number]  cc:   Margaretmary Bayley, M.D. Ricki Rodriguez, M.D.  Electronically Signed by Jeoffrey Massed  on 07/07/2010 04:34:33 PM

## 2010-07-08 LAB — GLUCOSE, CAPILLARY
Glucose-Capillary: 172 mg/dL — ABNORMAL HIGH (ref 70–99)
Glucose-Capillary: 221 mg/dL — ABNORMAL HIGH (ref 70–99)
Glucose-Capillary: 123 mg/dL — ABNORMAL HIGH (ref 70–99)

## 2010-07-09 ENCOUNTER — Inpatient Hospital Stay (HOSPITAL_COMMUNITY): Payer: Medicare Other

## 2010-07-09 LAB — BASIC METABOLIC PANEL
Chloride: 99 mEq/L (ref 96–112)
GFR calc non Af Amer: 34 mL/min — ABNORMAL LOW (ref >60)
Potassium: 4 mEq/L (ref 3.5–5.1)
Sodium: 137 mEq/L (ref 135–145)

## 2010-07-09 LAB — GLUCOSE, CAPILLARY
Glucose-Capillary: 86 mg/dL (ref 70–99)
Glucose-Capillary: 233 mg/dL — ABNORMAL HIGH (ref 70–99)
Glucose-Capillary: 254 mg/dL — ABNORMAL HIGH (ref 70–99)
Glucose-Capillary: 164 mg/dL — ABNORMAL HIGH (ref 70–99)

## 2010-07-10 LAB — GLUCOSE, CAPILLARY: Glucose-Capillary: 154 mg/dL — ABNORMAL HIGH (ref 70–99)

## 2010-07-11 LAB — GLUCOSE, CAPILLARY
Glucose-Capillary: 135 mg/dL — ABNORMAL HIGH (ref 70–99)
Glucose-Capillary: 202 mg/dL — ABNORMAL HIGH (ref 70–99)
Glucose-Capillary: 276 mg/dL — ABNORMAL HIGH (ref 70–99)

## 2010-07-13 ENCOUNTER — Emergency Department (HOSPITAL_COMMUNITY)
Admission: EM | Admit: 2010-07-13 | Discharge: 2010-07-13 | Disposition: A | Discharge status: Home / Self Care | Payer: Medicare Other | Attending: Emergency Medicine | Admitting: Emergency Medicine

## 2010-07-13 DIAGNOSIS — Z794 Long term (current) use of insulin: Secondary | ICD-10-CM | POA: Insufficient documentation

## 2010-07-13 DIAGNOSIS — E119 Type 2 diabetes mellitus without complications: Secondary | ICD-10-CM | POA: Insufficient documentation

## 2010-07-13 DIAGNOSIS — Z86718 Personal history of other venous thrombosis and embolism: Secondary | ICD-10-CM | POA: Insufficient documentation

## 2010-07-13 DIAGNOSIS — I10 Essential (primary) hypertension: Secondary | ICD-10-CM | POA: Insufficient documentation

## 2010-07-13 DIAGNOSIS — M549 Dorsalgia, unspecified: Secondary | ICD-10-CM | POA: Insufficient documentation

## 2010-07-13 DIAGNOSIS — I2510 Atherosclerotic heart disease of native coronary artery without angina pectoris: Secondary | ICD-10-CM | POA: Insufficient documentation

## 2010-07-13 LAB — GLUCOSE, CAPILLARY: Glucose-Capillary: 224 mg/dL — ABNORMAL HIGH (ref 70–99)

## 2010-07-15 ENCOUNTER — Emergency Department (HOSPITAL_COMMUNITY)
Admission: EM | Admit: 2010-07-15 | Discharge: 2010-07-15 | Disposition: A | Discharge status: Home / Self Care | Payer: Medicare Other | Attending: Emergency Medicine | Admitting: Emergency Medicine

## 2010-07-15 DIAGNOSIS — Z86718 Personal history of other venous thrombosis and embolism: Secondary | ICD-10-CM | POA: Insufficient documentation

## 2010-07-15 DIAGNOSIS — R040 Epistaxis: Secondary | ICD-10-CM | POA: Insufficient documentation

## 2010-07-15 DIAGNOSIS — I2510 Atherosclerotic heart disease of native coronary artery without angina pectoris: Secondary | ICD-10-CM | POA: Insufficient documentation

## 2010-07-15 DIAGNOSIS — E119 Type 2 diabetes mellitus without complications: Secondary | ICD-10-CM | POA: Insufficient documentation

## 2010-07-15 DIAGNOSIS — I10 Essential (primary) hypertension: Secondary | ICD-10-CM | POA: Insufficient documentation

## 2010-07-15 DIAGNOSIS — N390 Urinary tract infection, site not specified: Secondary | ICD-10-CM | POA: Insufficient documentation

## 2010-07-15 DIAGNOSIS — Z794 Long term (current) use of insulin: Secondary | ICD-10-CM | POA: Insufficient documentation

## 2010-07-15 LAB — URINALYSIS, ROUTINE W REFLEX MICROSCOPIC
Bilirubin Urine: NEGATIVE
Hgb urine dipstick: NEGATIVE
Ketones, ur: NEGATIVE mg/dL
Protein, ur: 30 mg/dL — AB
Urine Glucose, Fasting: NEGATIVE mg/dL
pH: 7 (ref 5.0–8.0)

## 2010-07-15 LAB — DIFFERENTIAL
Basophils Absolute: 0.1 10*3/uL (ref 0.0–0.1)
Eosinophils Relative: 5 % (ref 0–5)
Lymphs Abs: 1.2 10*3/uL (ref 0.7–4.0)
Monocytes Relative: 8 % (ref 3–12)
Neutrophils Relative %: 70 % (ref 43–77)

## 2010-07-15 LAB — CBC
HCT: 34.4 % — ABNORMAL LOW (ref 36.0–46.0)
MCV: 83.5 fL (ref 78.0–100.0)
Platelets: ADEQUATE 10*3/uL (ref 150–400)
RBC: 4.12 MIL/uL (ref 3.87–5.11)
RDW: 18.6 % — ABNORMAL HIGH (ref 11.5–15.5)
WBC: 7.7 10*3/uL (ref 4.0–10.5)

## 2010-07-15 LAB — COMPREHENSIVE METABOLIC PANEL
ALT: 184 U/L — ABNORMAL HIGH (ref 0–35)
AST: 69 U/L — ABNORMAL HIGH (ref 0–37)
Alkaline Phosphatase: 103 U/L (ref 39–117)
CO2: 22 mEq/L (ref 19–32)
Calcium: 9.4 mg/dL (ref 8.4–10.5)
GFR calc Af Amer: 50 mL/min — ABNORMAL LOW (ref >60)
Potassium: 4.9 mEq/L (ref 3.5–5.1)
Sodium: 137 mEq/L (ref 135–145)
Total Protein: 7.9 g/dL (ref 6.0–8.3)

## 2010-07-15 LAB — URINE MICROSCOPIC-ADD ON

## 2010-07-17 ENCOUNTER — Emergency Department (HOSPITAL_COMMUNITY)
Admission: EM | Admit: 2010-07-17 | Discharge: 2010-07-17 | Disposition: A | Discharge status: Home / Self Care | Payer: Medicare Other | Attending: Emergency Medicine | Admitting: Emergency Medicine

## 2010-07-17 DIAGNOSIS — Z8673 Personal history of transient ischemic attack (TIA), and cerebral infarction without residual deficits: Secondary | ICD-10-CM | POA: Insufficient documentation

## 2010-07-17 DIAGNOSIS — E119 Type 2 diabetes mellitus without complications: Secondary | ICD-10-CM | POA: Insufficient documentation

## 2010-07-17 DIAGNOSIS — I10 Essential (primary) hypertension: Secondary | ICD-10-CM | POA: Insufficient documentation

## 2010-07-17 DIAGNOSIS — I2510 Atherosclerotic heart disease of native coronary artery without angina pectoris: Secondary | ICD-10-CM | POA: Insufficient documentation

## 2010-07-17 DIAGNOSIS — H548 Legal blindness, as defined in USA: Secondary | ICD-10-CM | POA: Insufficient documentation

## 2010-07-17 DIAGNOSIS — M545 Low back pain, unspecified: Secondary | ICD-10-CM | POA: Insufficient documentation

## 2010-07-17 DIAGNOSIS — Z794 Long term (current) use of insulin: Secondary | ICD-10-CM | POA: Insufficient documentation

## 2010-07-17 DIAGNOSIS — Z7982 Long term (current) use of aspirin: Secondary | ICD-10-CM | POA: Insufficient documentation

## 2010-07-17 DIAGNOSIS — G8929 Other chronic pain: Secondary | ICD-10-CM | POA: Insufficient documentation

## 2010-07-17 DIAGNOSIS — Z86718 Personal history of other venous thrombosis and embolism: Secondary | ICD-10-CM | POA: Insufficient documentation

## 2010-07-17 DIAGNOSIS — J4489 Other specified chronic obstructive pulmonary disease: Secondary | ICD-10-CM | POA: Insufficient documentation

## 2010-07-17 DIAGNOSIS — J449 Chronic obstructive pulmonary disease, unspecified: Secondary | ICD-10-CM | POA: Insufficient documentation

## 2010-07-17 DIAGNOSIS — I509 Heart failure, unspecified: Secondary | ICD-10-CM | POA: Insufficient documentation

## 2010-07-17 DIAGNOSIS — Z79899 Other long term (current) drug therapy: Secondary | ICD-10-CM | POA: Insufficient documentation

## 2010-07-17 LAB — DIFFERENTIAL
Basophils Relative: 1 % (ref 0–1)
Eosinophils Absolute: 0.3 10*3/uL (ref 0.0–0.7)
Eosinophils Relative: 4 % (ref 0–5)
Monocytes Absolute: 0.4 10*3/uL (ref 0.1–1.0)
Monocytes Relative: 6 % (ref 3–12)

## 2010-07-17 LAB — URINALYSIS, ROUTINE W REFLEX MICROSCOPIC
Ketones, ur: NEGATIVE mg/dL
Protein, ur: 30 mg/dL — AB
Urine Glucose, Fasting: 250 mg/dL — AB
Urobilinogen, UA: 0.2 mg/dL (ref 0.0–1.0)

## 2010-07-17 LAB — CBC
Hemoglobin: 9.8 g/dL — ABNORMAL LOW (ref 12.0–15.0)
MCH: 24.9 pg — ABNORMAL LOW (ref 26.0–34.0)
MCHC: 30 g/dL (ref 30.0–36.0)
Platelets: 286 10*3/uL (ref 150–400)
RDW: 18.3 % — ABNORMAL HIGH (ref 11.5–15.5)

## 2010-07-17 LAB — URINE MICROSCOPIC-ADD ON

## 2010-07-17 LAB — POCT I-STAT, CHEM 8
BUN: 30 mg/dL — ABNORMAL HIGH (ref 6–23)
Calcium, Ion: 1.14 mmol/L (ref 1.12–1.32)
Creatinine, Ser: 1.4 mg/dL — ABNORMAL HIGH (ref 0.4–1.2)
Hemoglobin: 11.6 g/dL — ABNORMAL LOW (ref 12.0–15.0)
TCO2: 27 mmol/L (ref 0–100)

## 2010-07-18 LAB — URINE CULTURE
Colony Count: NO GROWTH
Culture  Setup Time: 201203012009

## 2010-07-23 ENCOUNTER — Emergency Department (HOSPITAL_COMMUNITY): Payer: Medicare Other

## 2010-07-23 ENCOUNTER — Emergency Department (HOSPITAL_COMMUNITY)
Admission: EM | Admit: 2010-07-23 | Discharge: 2010-07-23 | Disposition: A | Discharge status: Home / Self Care | Payer: Medicare Other | Attending: Emergency Medicine | Admitting: Emergency Medicine

## 2010-07-23 DIAGNOSIS — R071 Chest pain on breathing: Secondary | ICD-10-CM | POA: Insufficient documentation

## 2010-07-23 DIAGNOSIS — E119 Type 2 diabetes mellitus without complications: Secondary | ICD-10-CM | POA: Insufficient documentation

## 2010-07-23 DIAGNOSIS — R079 Chest pain, unspecified: Secondary | ICD-10-CM | POA: Insufficient documentation

## 2010-07-23 DIAGNOSIS — M549 Dorsalgia, unspecified: Secondary | ICD-10-CM | POA: Insufficient documentation

## 2010-07-23 DIAGNOSIS — I10 Essential (primary) hypertension: Secondary | ICD-10-CM | POA: Insufficient documentation

## 2010-07-23 DIAGNOSIS — Z8673 Personal history of transient ischemic attack (TIA), and cerebral infarction without residual deficits: Secondary | ICD-10-CM | POA: Insufficient documentation

## 2010-07-23 DIAGNOSIS — R0602 Shortness of breath: Secondary | ICD-10-CM | POA: Insufficient documentation

## 2010-07-23 DIAGNOSIS — I2510 Atherosclerotic heart disease of native coronary artery without angina pectoris: Secondary | ICD-10-CM | POA: Insufficient documentation

## 2010-07-23 LAB — POCT I-STAT, CHEM 8
Creatinine, Ser: 1.8 mg/dL — ABNORMAL HIGH (ref 0.4–1.2)
Glucose, Bld: 133 mg/dL — ABNORMAL HIGH (ref 70–99)
Hemoglobin: 10.9 g/dL — ABNORMAL LOW (ref 12.0–15.0)
Sodium: 142 mEq/L (ref 135–145)
TCO2: 27 mmol/L (ref 0–100)

## 2010-07-28 LAB — DIGOXIN LEVEL: Digoxin Level: 0.5 ng/mL — ABNORMAL LOW (ref 0.8–2.0)

## 2010-07-28 LAB — CBC
HCT: 28 % — ABNORMAL LOW (ref 36.0–46.0)
HCT: 29 % — ABNORMAL LOW (ref 36.0–46.0)
HCT: 29.9 % — ABNORMAL LOW (ref 36.0–46.0)
Hemoglobin: 8.7 g/dL — ABNORMAL LOW (ref 12.0–15.0)
Hemoglobin: 8.9 g/dL — ABNORMAL LOW (ref 12.0–15.0)
MCH: 26.3 pg (ref 26.0–34.0)
MCH: 26.4 pg (ref 26.0–34.0)
MCHC: 31.1 g/dL (ref 30.0–36.0)
MCHC: 31.2 g/dL (ref 30.0–36.0)
MCV: 84.8 fL (ref 78.0–100.0)
MCV: 84.8 fL (ref 78.0–100.0)
Platelets: 221 K/uL (ref 150–400)
Platelets: 281 10*3/uL (ref 150–400)
RBC: 3.3 MIL/uL — ABNORMAL LOW (ref 3.87–5.11)
RBC: 3.42 MIL/uL — ABNORMAL LOW (ref 3.87–5.11)
RDW: 17 % — ABNORMAL HIGH (ref 11.5–15.5)
RDW: 17.5 % — ABNORMAL HIGH (ref 11.5–15.5)
RDW: 17.6 % — ABNORMAL HIGH (ref 11.5–15.5)
WBC: 7.2 10*3/uL (ref 4.0–10.5)
WBC: 9.1 10*3/uL (ref 4.0–10.5)
WBC: 9.4 10*3/uL (ref 4.0–10.5)

## 2010-07-28 LAB — DIFFERENTIAL
Basophils Absolute: 0 10*3/uL (ref 0.0–0.1)
Basophils Relative: 0 % (ref 0–1)
Eosinophils Absolute: 0.1 K/uL (ref 0.0–0.7)
Eosinophils Relative: 1 % (ref 0–5)
Lymphocytes Relative: 7 % — ABNORMAL LOW (ref 12–46)
Lymphs Abs: 0.7 K/uL (ref 0.7–4.0)
Monocytes Absolute: 0.5 10*3/uL (ref 0.1–1.0)
Monocytes Relative: 6 % (ref 3–12)
Neutro Abs: 7.8 10*3/uL — ABNORMAL HIGH (ref 1.7–7.7)
Neutrophils Relative %: 85 % — ABNORMAL HIGH (ref 43–77)

## 2010-07-28 LAB — URINALYSIS, ROUTINE W REFLEX MICROSCOPIC
Bilirubin Urine: NEGATIVE
Glucose, UA: 250 mg/dL — AB
Hgb urine dipstick: NEGATIVE
Ketones, ur: NEGATIVE mg/dL
Leukocytes, UA: NEGATIVE
Nitrite: NEGATIVE
Protein, ur: 100 mg/dL — AB
Protein, ur: 30 mg/dL — AB
Specific Gravity, Urine: 1.018 (ref 1.005–1.030)
Urobilinogen, UA: 0.2 mg/dL (ref 0.0–1.0)
Urobilinogen, UA: 0.2 mg/dL (ref 0.0–1.0)
pH: 5.5 (ref 5.0–8.0)

## 2010-07-28 LAB — COMPREHENSIVE METABOLIC PANEL WITH GFR
ALT: 24 U/L (ref 0–35)
AST: 35 U/L (ref 0–37)
Albumin: 2.6 g/dL — ABNORMAL LOW (ref 3.5–5.2)
CO2: 22 meq/L (ref 19–32)
Calcium: 8.4 mg/dL (ref 8.4–10.5)
Creatinine, Ser: 1.42 mg/dL — ABNORMAL HIGH (ref 0.4–1.2)
GFR calc Af Amer: 43 mL/min — ABNORMAL LOW (ref >60)
GFR calc non Af Amer: 35 mL/min — ABNORMAL LOW (ref >60)
Sodium: 133 meq/L — ABNORMAL LOW (ref 135–145)
Total Protein: 6.9 g/dL (ref 6.0–8.3)

## 2010-07-28 LAB — BASIC METABOLIC PANEL
BUN: 28 mg/dL — ABNORMAL HIGH (ref 6–23)
CO2: 30 mEq/L (ref 19–32)
Calcium: 9.1 mg/dL (ref 8.4–10.5)
Creatinine, Ser: 1.32 mg/dL — ABNORMAL HIGH (ref 0.4–1.2)
GFR calc Af Amer: 47 mL/min — ABNORMAL LOW (ref >60)
GFR calc non Af Amer: 41 mL/min — ABNORMAL LOW (ref >60)
Glucose, Bld: 347 mg/dL — ABNORMAL HIGH (ref 70–99)
Potassium: 3.4 mEq/L — ABNORMAL LOW (ref 3.5–5.1)
Sodium: 142 mEq/L (ref 135–145)

## 2010-07-28 LAB — CARDIAC PANEL(CRET KIN+CKTOT+MB+TROPI)
CK, MB: 3.4 ng/mL (ref 0.3–4.0)
Relative Index: 2.3 (ref 0.0–2.5)
Total CK: 150 U/L (ref 7–177)
Total CK: 166 U/L (ref 7–177)
Troponin I: 0.45 ng/mL — ABNORMAL HIGH (ref 0.00–0.06)

## 2010-07-28 LAB — GLUCOSE, CAPILLARY
Glucose-Capillary: 121 mg/dL — ABNORMAL HIGH (ref 70–99)
Glucose-Capillary: 152 mg/dL — ABNORMAL HIGH (ref 70–99)
Glucose-Capillary: 185 mg/dL — ABNORMAL HIGH (ref 70–99)
Glucose-Capillary: 198 mg/dL — ABNORMAL HIGH (ref 70–99)
Glucose-Capillary: 204 mg/dL — ABNORMAL HIGH (ref 70–99)
Glucose-Capillary: 256 mg/dL — ABNORMAL HIGH (ref 70–99)
Glucose-Capillary: 265 mg/dL — ABNORMAL HIGH (ref 70–99)
Glucose-Capillary: 266 mg/dL — ABNORMAL HIGH (ref 70–99)
Glucose-Capillary: 268 mg/dL — ABNORMAL HIGH (ref 70–99)
Glucose-Capillary: 269 mg/dL — ABNORMAL HIGH (ref 70–99)
Glucose-Capillary: 278 mg/dL — ABNORMAL HIGH (ref 70–99)
Glucose-Capillary: 282 mg/dL — ABNORMAL HIGH (ref 70–99)
Glucose-Capillary: 283 mg/dL — ABNORMAL HIGH (ref 70–99)
Glucose-Capillary: 293 mg/dL — ABNORMAL HIGH (ref 70–99)
Glucose-Capillary: 314 mg/dL — ABNORMAL HIGH (ref 70–99)
Glucose-Capillary: 338 mg/dL — ABNORMAL HIGH (ref 70–99)
Glucose-Capillary: 348 mg/dL — ABNORMAL HIGH (ref 70–99)

## 2010-07-28 LAB — COMPREHENSIVE METABOLIC PANEL
ALT: 24 U/L (ref 0–35)
Alkaline Phosphatase: 57 U/L (ref 39–117)
Alkaline Phosphatase: 63 U/L (ref 39–117)
BUN: 38 mg/dL — ABNORMAL HIGH (ref 6–23)
CO2: 27 mEq/L (ref 19–32)
Chloride: 97 mEq/L (ref 96–112)
Chloride: 99 mEq/L (ref 96–112)
Glucose, Bld: 269 mg/dL — ABNORMAL HIGH (ref 70–99)
Glucose, Bld: 309 mg/dL — ABNORMAL HIGH (ref 70–99)
Potassium: 4.4 mEq/L (ref 3.5–5.1)
Potassium: 4.5 mEq/L (ref 3.5–5.1)
Sodium: 134 mEq/L — ABNORMAL LOW (ref 135–145)
Total Bilirubin: 0.5 mg/dL (ref 0.3–1.2)
Total Bilirubin: 0.6 mg/dL (ref 0.3–1.2)
Total Protein: 6.5 g/dL (ref 6.0–8.3)

## 2010-07-28 LAB — LIPID PANEL: VLDL: 17 mg/dL (ref 0–40)

## 2010-07-28 LAB — POCT CARDIAC MARKERS
CKMB, poc: 3.8 ng/mL (ref 1.0–8.0)
Myoglobin, poc: 203 ng/mL (ref 12–200)
Troponin i, poc: <0.05 ng/mL (ref 0.00–0.09)

## 2010-07-28 LAB — CK TOTAL AND CKMB (NOT AT ARMC)
CK, MB: 3.3 ng/mL (ref 0.3–4.0)
Relative Index: 2 (ref 0.0–2.5)
Total CK: 164 U/L (ref 7–177)

## 2010-07-28 LAB — POCT I-STAT 3, VENOUS BLOOD GAS (G3P V)
Acid-Base Excess: 2 mmol/L (ref 0.0–2.0)
Bicarbonate: 25.7 meq/L — ABNORMAL HIGH (ref 20.0–24.0)
O2 Saturation: 85 %
TCO2: 27 mmol/L (ref 0–100)
pCO2, Ven: 37.3 mmHg — ABNORMAL LOW (ref 45.0–50.0)
pH, Ven: 7.446 — ABNORMAL HIGH (ref 7.250–7.300)
pO2, Ven: 48 mmHg — ABNORMAL HIGH (ref 30.0–45.0)

## 2010-07-28 LAB — URINE CULTURE
Colony Count: NO GROWTH
Culture: NO GROWTH

## 2010-07-28 LAB — HEMOGLOBIN A1C
Hgb A1c MFr Bld: 6.7 % — ABNORMAL HIGH (ref <5.7)
Mean Plasma Glucose: 146 mg/dL — ABNORMAL HIGH (ref <117)

## 2010-07-28 LAB — URINE MICROSCOPIC-ADD ON

## 2010-07-28 LAB — MAGNESIUM: Magnesium: 2.2 mg/dL (ref 1.5–2.5)

## 2010-07-28 LAB — TROPONIN I: Troponin I: 0.06 ng/mL (ref 0.00–0.06)

## 2010-07-28 LAB — BRAIN NATRIURETIC PEPTIDE: Pro B Natriuretic peptide (BNP): 793 pg/mL — ABNORMAL HIGH (ref 0.0–100.0)

## 2010-07-28 LAB — PHOSPHORUS: Phosphorus: 2.9 mg/dL (ref 2.3–4.6)

## 2010-07-28 LAB — IRON AND TIBC
Iron: 27 ug/dL — ABNORMAL LOW (ref 42–135)
Saturation Ratios: 8 % — ABNORMAL LOW (ref 20–55)
TIBC: 319 ug/dL (ref 250–470)

## 2010-07-28 LAB — VITAMIN B12: Vitamin B-12: 708 pg/mL (ref 211–911)

## 2010-07-28 LAB — TSH: TSH: 2.553 u[IU]/mL (ref 0.350–4.500)

## 2010-07-28 LAB — PROTIME-INR: Prothrombin Time: 13.9 seconds (ref 11.6–15.2)

## 2010-07-28 LAB — FOLATE: Folate: >20 ng/mL

## 2010-07-31 LAB — GLUCOSE, CAPILLARY
Glucose-Capillary: 170 mg/dL — ABNORMAL HIGH (ref 70–99)
Glucose-Capillary: 190 mg/dL — ABNORMAL HIGH (ref 70–99)
Glucose-Capillary: 210 mg/dL — ABNORMAL HIGH (ref 70–99)
Glucose-Capillary: 210 mg/dL — ABNORMAL HIGH (ref 70–99)
Glucose-Capillary: 211 mg/dL — ABNORMAL HIGH (ref 70–99)
Glucose-Capillary: 215 mg/dL — ABNORMAL HIGH (ref 70–99)
Glucose-Capillary: 241 mg/dL — ABNORMAL HIGH (ref 70–99)
Glucose-Capillary: 273 mg/dL — ABNORMAL HIGH (ref 70–99)
Glucose-Capillary: 273 mg/dL — ABNORMAL HIGH (ref 70–99)
Glucose-Capillary: 274 mg/dL — ABNORMAL HIGH (ref 70–99)
Glucose-Capillary: 274 mg/dL — ABNORMAL HIGH (ref 70–99)
Glucose-Capillary: 282 mg/dL — ABNORMAL HIGH (ref 70–99)

## 2010-07-31 LAB — BASIC METABOLIC PANEL
BUN: 37 mg/dL — ABNORMAL HIGH (ref 6–23)
BUN: 44 mg/dL — ABNORMAL HIGH (ref 6–23)
CO2: 25 mEq/L (ref 19–32)
CO2: 26 mEq/L (ref 19–32)
Calcium: 8.4 mg/dL (ref 8.4–10.5)
Calcium: 8.7 mg/dL (ref 8.4–10.5)
Chloride: 101 mEq/L (ref 96–112)
Chloride: 104 mEq/L (ref 96–112)
Creatinine, Ser: 1.26 mg/dL — ABNORMAL HIGH (ref 0.4–1.2)
Creatinine, Ser: 1.43 mg/dL — ABNORMAL HIGH (ref 0.4–1.2)
GFR calc Af Amer: 43 mL/min — ABNORMAL LOW (ref >60)
GFR calc Af Amer: 49 mL/min — ABNORMAL LOW (ref >60)
Glucose, Bld: 250 mg/dL — ABNORMAL HIGH (ref 70–99)

## 2010-07-31 LAB — CBC
HCT: 29.6 % — ABNORMAL LOW (ref 36.0–46.0)
MCH: 25.7 pg — ABNORMAL LOW (ref 26.0–34.0)
MCH: 26.4 pg (ref 26.0–34.0)
MCHC: 31.6 g/dL (ref 30.0–36.0)
MCV: 83.1 fL (ref 78.0–100.0)
MCV: 83.6 fL (ref 78.0–100.0)
MCV: 83.9 fL (ref 78.0–100.0)
Platelets: 260 10*3/uL (ref 150–400)
Platelets: 267 10*3/uL (ref 150–400)
RBC: 3.35 MIL/uL — ABNORMAL LOW (ref 3.87–5.11)
RBC: 3.56 MIL/uL — ABNORMAL LOW (ref 3.87–5.11)
RDW: 15.7 % — ABNORMAL HIGH (ref 11.5–15.5)
RDW: 15.7 % — ABNORMAL HIGH (ref 11.5–15.5)
WBC: 7 10*3/uL (ref 4.0–10.5)
WBC: 9.8 10*3/uL (ref 4.0–10.5)

## 2010-07-31 LAB — POCT I-STAT, CHEM 8
Calcium, Ion: 0.97 mmol/L — ABNORMAL LOW (ref 1.12–1.32)
Chloride: 111 mEq/L (ref 96–112)
HCT: 30 % — ABNORMAL LOW (ref 36.0–46.0)
Hemoglobin: 10.2 g/dL — ABNORMAL LOW (ref 12.0–15.0)
Potassium: 3.5 mEq/L (ref 3.5–5.1)

## 2010-07-31 LAB — DIFFERENTIAL
Basophils Relative: 1 % (ref 0–1)
Eosinophils Absolute: 0.1 10*3/uL (ref 0.0–0.7)
Eosinophils Relative: 1 % (ref 0–5)
Eosinophils Relative: 2 % (ref 0–5)
Lymphocytes Relative: 7 % — ABNORMAL LOW (ref 12–46)
Lymphs Abs: 0.7 10*3/uL (ref 0.7–4.0)
Monocytes Absolute: 0.6 10*3/uL (ref 0.1–1.0)
Neutro Abs: 8.4 10*3/uL — ABNORMAL HIGH (ref 1.7–7.7)
Neutrophils Relative %: 72 % (ref 43–77)

## 2010-07-31 LAB — CULTURE, BLOOD (ROUTINE X 2)
Culture  Setup Time: 201109190827
Culture  Setup Time: 201109190828
Culture: NO GROWTH

## 2010-07-31 LAB — URINALYSIS, ROUTINE W REFLEX MICROSCOPIC
Bilirubin Urine: NEGATIVE
Hgb urine dipstick: NEGATIVE
Ketones, ur: NEGATIVE mg/dL
Protein, ur: 30 mg/dL — AB
Urobilinogen, UA: 0.2 mg/dL (ref 0.0–1.0)

## 2010-07-31 LAB — POCT CARDIAC MARKERS
CKMB, poc: 3.4 ng/mL (ref 1.0–8.0)
Troponin i, poc: 0.17 ng/mL — ABNORMAL HIGH (ref 0.00–0.09)

## 2010-07-31 LAB — DIGOXIN LEVEL: Digoxin Level: 0.3 ng/mL — ABNORMAL LOW (ref 0.8–2.0)

## 2010-07-31 LAB — BRAIN NATRIURETIC PEPTIDE
Pro B Natriuretic peptide (BNP): 526 pg/mL — ABNORMAL HIGH (ref 0.0–100.0)
Pro B Natriuretic peptide (BNP): 719 pg/mL — ABNORMAL HIGH (ref 0.0–100.0)

## 2010-08-04 LAB — BASIC METABOLIC PANEL
BUN: 37 mg/dL — ABNORMAL HIGH (ref 6–23)
BUN: 44 mg/dL — ABNORMAL HIGH (ref 6–23)
BUN: 55 mg/dL — ABNORMAL HIGH (ref 6–23)
BUN: 58 mg/dL — ABNORMAL HIGH (ref 6–23)
BUN: 66 mg/dL — ABNORMAL HIGH (ref 6–23)
BUN: 81 mg/dL — ABNORMAL HIGH (ref 6–23)
CO2: 21 mEq/L (ref 19–32)
CO2: 23 mEq/L (ref 19–32)
Calcium: 8.5 mg/dL (ref 8.4–10.5)
Calcium: 8.9 mg/dL (ref 8.4–10.5)
Calcium: 9.1 mg/dL (ref 8.4–10.5)
Calcium: 9.3 mg/dL (ref 8.4–10.5)
Chloride: 104 mEq/L (ref 96–112)
Chloride: 105 mEq/L (ref 96–112)
Chloride: 105 mEq/L (ref 96–112)
Chloride: 106 mEq/L (ref 96–112)
Creatinine, Ser: 1.8 mg/dL — ABNORMAL HIGH (ref 0.4–1.2)
Creatinine, Ser: 1.89 mg/dL — ABNORMAL HIGH (ref 0.4–1.2)
GFR calc Af Amer: 30 mL/min — ABNORMAL LOW (ref >60)
GFR calc Af Amer: 33 mL/min — ABNORMAL LOW (ref >60)
GFR calc Af Amer: 35 mL/min — ABNORMAL LOW (ref >60)
GFR calc non Af Amer: 24 mL/min — ABNORMAL LOW (ref >60)
GFR calc non Af Amer: 26 mL/min — ABNORMAL LOW (ref >60)
GFR calc non Af Amer: 28 mL/min — ABNORMAL LOW (ref >60)
GFR calc non Af Amer: 29 mL/min — ABNORMAL LOW (ref >60)
Glucose, Bld: 229 mg/dL — ABNORMAL HIGH (ref 70–99)
Glucose, Bld: 263 mg/dL — ABNORMAL HIGH (ref 70–99)
Glucose, Bld: 317 mg/dL — ABNORMAL HIGH (ref 70–99)
Glucose, Bld: 85 mg/dL (ref 70–99)
Potassium: 4 mEq/L (ref 3.5–5.1)
Potassium: 4.3 mEq/L (ref 3.5–5.1)
Potassium: 4.4 mEq/L (ref 3.5–5.1)
Potassium: 4.4 mEq/L (ref 3.5–5.1)
Potassium: 5.2 mEq/L — ABNORMAL HIGH (ref 3.5–5.1)
Sodium: 133 mEq/L — ABNORMAL LOW (ref 135–145)

## 2010-08-04 LAB — CBC
HCT: 29 % — ABNORMAL LOW (ref 36.0–46.0)
HCT: 29.9 % — ABNORMAL LOW (ref 36.0–46.0)
HCT: 31.9 % — ABNORMAL LOW (ref 36.0–46.0)
HCT: 40.2 % (ref 36.0–46.0)
Hemoglobin: 9.7 g/dL — ABNORMAL LOW (ref 12.0–15.0)
MCHC: 33.6 g/dL (ref 30.0–36.0)
MCHC: 33.9 g/dL (ref 30.0–36.0)
MCV: 86.7 fL (ref 78.0–100.0)
MCV: 87.5 fL (ref 78.0–100.0)
Platelets: 155 10*3/uL (ref 150–400)
Platelets: 180 10*3/uL (ref 150–400)
Platelets: 187 10*3/uL (ref 150–400)
Platelets: 206 10*3/uL (ref 150–400)
RBC: 3.79 MIL/uL — ABNORMAL LOW (ref 3.87–5.11)
RBC: 4.62 MIL/uL (ref 3.87–5.11)
RDW: 16 % — ABNORMAL HIGH (ref 11.5–15.5)
RDW: 16.6 % — ABNORMAL HIGH (ref 11.5–15.5)
WBC: 11.7 10*3/uL — ABNORMAL HIGH (ref 4.0–10.5)
WBC: 13.8 10*3/uL — ABNORMAL HIGH (ref 4.0–10.5)
WBC: 8 10*3/uL (ref 4.0–10.5)

## 2010-08-04 LAB — GLUCOSE, CAPILLARY
Glucose-Capillary: 108 mg/dL — ABNORMAL HIGH (ref 70–99)
Glucose-Capillary: 132 mg/dL — ABNORMAL HIGH (ref 70–99)
Glucose-Capillary: 152 mg/dL — ABNORMAL HIGH (ref 70–99)
Glucose-Capillary: 166 mg/dL — ABNORMAL HIGH (ref 70–99)
Glucose-Capillary: 169 mg/dL — ABNORMAL HIGH (ref 70–99)
Glucose-Capillary: 188 mg/dL — ABNORMAL HIGH (ref 70–99)
Glucose-Capillary: 213 mg/dL — ABNORMAL HIGH (ref 70–99)
Glucose-Capillary: 234 mg/dL — ABNORMAL HIGH (ref 70–99)
Glucose-Capillary: 281 mg/dL — ABNORMAL HIGH (ref 70–99)
Glucose-Capillary: 333 mg/dL — ABNORMAL HIGH (ref 70–99)
Glucose-Capillary: 339 mg/dL — ABNORMAL HIGH (ref 70–99)
Glucose-Capillary: 472 mg/dL — ABNORMAL HIGH (ref 70–99)
Glucose-Capillary: 88 mg/dL (ref 70–99)
Glucose-Capillary: 99 mg/dL (ref 70–99)

## 2010-08-04 LAB — WOUND CULTURE

## 2010-08-04 LAB — URINE MICROSCOPIC-ADD ON

## 2010-08-04 LAB — URINALYSIS, ROUTINE W REFLEX MICROSCOPIC
Bilirubin Urine: NEGATIVE
Glucose, UA: >1000 mg/dL — AB
Hgb urine dipstick: NEGATIVE
Ketones, ur: NEGATIVE mg/dL
Leukocytes, UA: NEGATIVE
pH: 5 (ref 5.0–8.0)

## 2010-08-04 LAB — DIFFERENTIAL
Eosinophils Relative: 0 % (ref 0–5)
Lymphocytes Relative: 6 % — ABNORMAL LOW (ref 12–46)
Lymphs Abs: 0.8 10*3/uL (ref 0.7–4.0)
Neutrophils Relative %: 86 % — ABNORMAL HIGH (ref 43–77)

## 2010-08-04 LAB — HEMOGLOBIN A1C: Hgb A1c MFr Bld: 10.8 % — ABNORMAL HIGH (ref <5.7)

## 2010-08-04 LAB — DIGOXIN LEVEL: Digoxin Level: 0.2 ng/mL — ABNORMAL LOW (ref 0.8–2.0)

## 2010-08-04 LAB — TROPONIN I: Troponin I: 0.03 ng/mL (ref 0.00–0.06)

## 2010-08-06 LAB — CBC
HCT: 29.4 % — ABNORMAL LOW (ref 36.0–46.0)
HCT: 30 % — ABNORMAL LOW (ref 36.0–46.0)
HCT: 30.3 % — ABNORMAL LOW (ref 36.0–46.0)
HCT: 31 % — ABNORMAL LOW (ref 36.0–46.0)
HCT: 31.7 % — ABNORMAL LOW (ref 36.0–46.0)
HCT: 34.7 % — ABNORMAL LOW (ref 36.0–46.0)
Hemoglobin: 10.1 g/dL — ABNORMAL LOW (ref 12.0–15.0)
Hemoglobin: 10.5 g/dL — ABNORMAL LOW (ref 12.0–15.0)
Hemoglobin: 10.5 g/dL — ABNORMAL LOW (ref 12.0–15.0)
Hemoglobin: 10.8 g/dL — ABNORMAL LOW (ref 12.0–15.0)
Hemoglobin: 11.8 g/dL — ABNORMAL LOW (ref 12.0–15.0)
Hemoglobin: 9.9 g/dL — ABNORMAL LOW (ref 12.0–15.0)
MCHC: 33.4 g/dL (ref 30.0–36.0)
MCHC: 33.4 g/dL (ref 30.0–36.0)
MCHC: 33.5 g/dL (ref 30.0–36.0)
MCHC: 33.5 g/dL (ref 30.0–36.0)
MCHC: 34 g/dL (ref 30.0–36.0)
MCHC: 34 g/dL (ref 30.0–36.0)
MCV: 86.3 fL (ref 78.0–100.0)
MCV: 86.6 fL (ref 78.0–100.0)
MCV: 87 fL (ref 78.0–100.0)
MCV: 87.1 fL (ref 78.0–100.0)
MCV: 87.7 fL (ref 78.0–100.0)
Platelets: 138 10*3/uL — ABNORMAL LOW (ref 150–400)
Platelets: 141 10*3/uL — ABNORMAL LOW (ref 150–400)
Platelets: 143 10*3/uL — ABNORMAL LOW (ref 150–400)
Platelets: 154 10*3/uL (ref 150–400)
Platelets: 196 10*3/uL (ref 150–400)
Platelets: 224 10*3/uL (ref 150–400)
RBC: 3.38 MIL/uL — ABNORMAL LOW (ref 3.87–5.11)
RBC: 3.41 MIL/uL — ABNORMAL LOW (ref 3.87–5.11)
RBC: 3.57 MIL/uL — ABNORMAL LOW (ref 3.87–5.11)
RBC: 3.65 MIL/uL — ABNORMAL LOW (ref 3.87–5.11)
RBC: 4.03 MIL/uL (ref 3.87–5.11)
RDW: 15.2 % (ref 11.5–15.5)
RDW: 15.2 % (ref 11.5–15.5)
RDW: 15.3 % (ref 11.5–15.5)
RDW: 15.3 % (ref 11.5–15.5)
RDW: 15.4 % (ref 11.5–15.5)
RDW: 15.5 % (ref 11.5–15.5)
RDW: 15.6 % — ABNORMAL HIGH (ref 11.5–15.5)
WBC: 5.8 10*3/uL (ref 4.0–10.5)
WBC: 7.7 10*3/uL (ref 4.0–10.5)
WBC: 9.2 10*3/uL (ref 4.0–10.5)

## 2010-08-06 LAB — TYPE AND SCREEN
ABO/RH(D): A POS
Antibody Screen: NEGATIVE

## 2010-08-06 LAB — PROTIME-INR
INR: 1.07 (ref 0.00–1.49)
INR: 1.25 (ref 0.00–1.49)
INR: 1.61 — ABNORMAL HIGH (ref 0.00–1.49)
INR: 1.72 — ABNORMAL HIGH (ref 0.00–1.49)
INR: 2.22 — ABNORMAL HIGH (ref 0.00–1.49)
INR: 3.04 — ABNORMAL HIGH (ref 0.00–1.49)
Prothrombin Time: 15.6 seconds — ABNORMAL HIGH (ref 11.6–15.2)
Prothrombin Time: 20 seconds — ABNORMAL HIGH (ref 11.6–15.2)
Prothrombin Time: 24.4 seconds — ABNORMAL HIGH (ref 11.6–15.2)
Prothrombin Time: 26.9 seconds — ABNORMAL HIGH (ref 11.6–15.2)
Prothrombin Time: 31.2 seconds — ABNORMAL HIGH (ref 11.6–15.2)

## 2010-08-06 LAB — DIFFERENTIAL
Lymphs Abs: 0.6 10*3/uL — ABNORMAL LOW (ref 0.7–4.0)
Monocytes Absolute: 0.7 10*3/uL (ref 0.1–1.0)
Monocytes Relative: 9 % (ref 3–12)
Neutro Abs: 6.1 10*3/uL (ref 1.7–7.7)
Neutrophils Relative %: 82 % — ABNORMAL HIGH (ref 43–77)

## 2010-08-06 LAB — GLUCOSE, CAPILLARY
Glucose-Capillary: 107 mg/dL — ABNORMAL HIGH (ref 70–99)
Glucose-Capillary: 157 mg/dL — ABNORMAL HIGH (ref 70–99)
Glucose-Capillary: 160 mg/dL — ABNORMAL HIGH (ref 70–99)
Glucose-Capillary: 161 mg/dL — ABNORMAL HIGH (ref 70–99)
Glucose-Capillary: 167 mg/dL — ABNORMAL HIGH (ref 70–99)
Glucose-Capillary: 189 mg/dL — ABNORMAL HIGH (ref 70–99)
Glucose-Capillary: 196 mg/dL — ABNORMAL HIGH (ref 70–99)
Glucose-Capillary: 200 mg/dL — ABNORMAL HIGH (ref 70–99)
Glucose-Capillary: 207 mg/dL — ABNORMAL HIGH (ref 70–99)
Glucose-Capillary: 218 mg/dL — ABNORMAL HIGH (ref 70–99)
Glucose-Capillary: 226 mg/dL — ABNORMAL HIGH (ref 70–99)
Glucose-Capillary: 227 mg/dL — ABNORMAL HIGH (ref 70–99)
Glucose-Capillary: 230 mg/dL — ABNORMAL HIGH (ref 70–99)
Glucose-Capillary: 232 mg/dL — ABNORMAL HIGH (ref 70–99)
Glucose-Capillary: 232 mg/dL — ABNORMAL HIGH (ref 70–99)
Glucose-Capillary: 247 mg/dL — ABNORMAL HIGH (ref 70–99)
Glucose-Capillary: 247 mg/dL — ABNORMAL HIGH (ref 70–99)
Glucose-Capillary: 254 mg/dL — ABNORMAL HIGH (ref 70–99)
Glucose-Capillary: 259 mg/dL — ABNORMAL HIGH (ref 70–99)
Glucose-Capillary: 295 mg/dL — ABNORMAL HIGH (ref 70–99)
Glucose-Capillary: 296 mg/dL — ABNORMAL HIGH (ref 70–99)
Glucose-Capillary: 336 mg/dL — ABNORMAL HIGH (ref 70–99)

## 2010-08-06 LAB — COMPREHENSIVE METABOLIC PANEL
ALT: 17 U/L (ref 0–35)
ALT: 24 U/L (ref 0–35)
Albumin: 3 g/dL — ABNORMAL LOW (ref 3.5–5.2)
Alkaline Phosphatase: 51 U/L (ref 39–117)
BUN: 46 mg/dL — ABNORMAL HIGH (ref 6–23)
BUN: 50 mg/dL — ABNORMAL HIGH (ref 6–23)
CO2: 29 mEq/L (ref 19–32)
Calcium: 9.3 mg/dL (ref 8.4–10.5)
Chloride: 98 mEq/L (ref 96–112)
GFR calc non Af Amer: 31 mL/min — ABNORMAL LOW (ref >60)
Glucose, Bld: 122 mg/dL — ABNORMAL HIGH (ref 70–99)
Glucose, Bld: 230 mg/dL — ABNORMAL HIGH (ref 70–99)
Potassium: 4.5 mEq/L (ref 3.5–5.1)
Potassium: 5.8 mEq/L — ABNORMAL HIGH (ref 3.5–5.1)
Sodium: 135 mEq/L (ref 135–145)
Sodium: 136 mEq/L (ref 135–145)
Total Bilirubin: 0.5 mg/dL (ref 0.3–1.2)
Total Protein: 6.8 g/dL (ref 6.0–8.3)

## 2010-08-06 LAB — HEMOGLOBIN A1C
Hgb A1c MFr Bld: 9.4 % — ABNORMAL HIGH (ref 4.6–6.1)
Hgb A1c MFr Bld: 9.6 % — ABNORMAL HIGH (ref 4.6–6.1)
Mean Plasma Glucose: 223 mg/dL

## 2010-08-06 LAB — HEPARIN LEVEL (UNFRACTIONATED)
Heparin Unfractionated: 0.2 IU/mL — ABNORMAL LOW (ref 0.30–0.70)
Heparin Unfractionated: 0.38 IU/mL (ref 0.30–0.70)

## 2010-08-06 LAB — DIGOXIN LEVEL: Digoxin Level: <0.2 ng/mL — ABNORMAL LOW (ref 0.8–2.0)

## 2010-08-06 LAB — HEMOGLOBIN AND HEMATOCRIT, BLOOD
HCT: 28 % — ABNORMAL LOW (ref 36.0–46.0)
Hemoglobin: 9.4 g/dL — ABNORMAL LOW (ref 12.0–15.0)

## 2010-08-06 LAB — LIPID PANEL
Cholesterol: 81 mg/dL (ref 0–200)
Total CHOL/HDL Ratio: 2.2 RATIO

## 2010-08-06 LAB — HEMOCCULT GUIAC POC 1CARD (OFFICE): Fecal Occult Bld: POSITIVE

## 2010-08-08 LAB — BASIC METABOLIC PANEL
BUN: 13 mg/dL (ref 6–23)
CO2: 23 mEq/L (ref 19–32)
CO2: 24 mEq/L (ref 19–32)
Calcium: 7.7 mg/dL — ABNORMAL LOW (ref 8.4–10.5)
Chloride: 102 mEq/L (ref 96–112)
Creatinine, Ser: 1.14 mg/dL (ref 0.4–1.2)
GFR calc Af Amer: 48 mL/min — ABNORMAL LOW (ref >60)
GFR calc non Af Amer: 46 mL/min — ABNORMAL LOW (ref >60)
Glucose, Bld: 324 mg/dL — ABNORMAL HIGH (ref 70–99)
Sodium: 131 mEq/L — ABNORMAL LOW (ref 135–145)

## 2010-08-08 LAB — CBC
HCT: 31.5 % — ABNORMAL LOW (ref 36.0–46.0)
Hemoglobin: 11 g/dL — ABNORMAL LOW (ref 12.0–15.0)
MCHC: 33.6 g/dL (ref 30.0–36.0)
MCHC: 34 g/dL (ref 30.0–36.0)
MCV: 85.8 fL (ref 78.0–100.0)
MCV: 86.5 fL (ref 78.0–100.0)
Platelets: 261 10*3/uL (ref 150–400)
RBC: 3.65 MIL/uL — ABNORMAL LOW (ref 3.87–5.11)
RBC: 3.81 MIL/uL — ABNORMAL LOW (ref 3.87–5.11)
RDW: 15.1 % (ref 11.5–15.5)
WBC: 9.1 10*3/uL (ref 4.0–10.5)

## 2010-08-08 LAB — GLUCOSE, CAPILLARY
Glucose-Capillary: 164 mg/dL — ABNORMAL HIGH (ref 70–99)
Glucose-Capillary: 174 mg/dL — ABNORMAL HIGH (ref 70–99)
Glucose-Capillary: 203 mg/dL — ABNORMAL HIGH (ref 70–99)
Glucose-Capillary: 248 mg/dL — ABNORMAL HIGH (ref 70–99)
Glucose-Capillary: 280 mg/dL — ABNORMAL HIGH (ref 70–99)
Glucose-Capillary: 287 mg/dL — ABNORMAL HIGH (ref 70–99)
Glucose-Capillary: 288 mg/dL — ABNORMAL HIGH (ref 70–99)
Glucose-Capillary: 328 mg/dL — ABNORMAL HIGH (ref 70–99)

## 2010-08-08 LAB — PROTIME-INR
INR: 1.51 — ABNORMAL HIGH (ref 0.00–1.49)
INR: 2.43 — ABNORMAL HIGH (ref 0.00–1.49)
Prothrombin Time: 24.2 seconds — ABNORMAL HIGH (ref 11.6–15.2)
Prothrombin Time: 26.2 seconds — ABNORMAL HIGH (ref 11.6–15.2)

## 2010-08-09 NOTE — Discharge Summary (Signed)
[name], [name]               ACCOUNT NO.:  [id number]  MEDICAL RECORD NO.:  [id number]           PATIENT TYPE:  I  LOCATION:  3711                         FACILITY:  [facility]  PHYSICIAN:  Ricki Rodriguez, M.D.  DATE OF BIRTH:  [birth year]  DATE OF ADMISSION:  07/03/2010 DATE OF DISCHARGE:  07/11/2010                              DISCHARGE SUMMARY   The patient was admitted by Dr. Orpah Cobb.  FINAL DIAGNOSES: 1. Acute non-Q-wave myocardial infarction. 2. Acute on chronic left heart systolic failure. 3. Chronic constipation. 4. Diabetes mellitus type 2.  DISCHARGE MEDICATIONS: 1. Xanax 0.25 mg one daily at bedtime as needed. 2. Isosorbide mononitrate 15 mg daily. 3. Humulin 50/50 30 units every evening and sliding scale in the     morning. 4. Metoprolol tartrate 25 mg half a tablet twice daily. 5. Aspirin 81 mg at bedtime. 6. Allopurinol 100 mg at bedtime. 7. Calcium, magnesium, zinc over-the-counter one daily. 8. Cinnamon 1000 mg daily. 9. Coenzyme Q10 100 mg daily. 10.Combivent inhaler two puffs twice daily. 11.[name] 0.125 mg one every other day. 12.Famotidine 20 mg one at bedtime. 13.Geritol over-the-counter one daily. 14.I-Cap eye vitamin over-the-counter twice daily. 15.Liquid iron over-the-counter one daily. 16.Lasix 40 mg twice daily. 17.Lipitor 20 mg at bedtime. 18.Mineral oil over-the-counter two tablespoons daily at bedtime. 19.Omega-3 1 g two capsules daily. 20.The patient to discontinue taking metformin 500 mg twice daily.  DISCHARGE DIET:  Low-sodium, heart-healthy diet and 1200 mL fluid restriction per day.  DISCHARGE ACTIVITY:  The patient to increase activity slowly as tolerated. The patient to discontinue any activity that causes chest pain, shortness of breath, dizziness, sweating, or excessive weakness. Follow up by Dr. Orpah Cobb in 1 week.  The patient or family to call [phone] for appointment.  HISTORY:  This 77 year old black female  presented with shortness of breath of 1 week duration in spite of taking Lasix and increasing the dose.  The patient has a past medical history of diabetes, left heart systolic failure, chronic obstructive lung disease, deep venous thrombosis, pneumonia and chronic kidney disease.  PHYSICAL EXAMINATION:  VITAL SIGNS:  Temperature 98, pulse 83, respirations 18, blood pressure 146/79. GENERAL:  The patient is averagely built and nourished female in mild respiratory distress. HEENT:  The patient is normocephalic, atraumatic with brown eyes. Conjunctivae pink.  Sclerae nonicteric. NECK:  No JVD. LUNGS:  Reveal positive bibasilar crackles. HEART:  Normal S1-S2 with grade 2/6 systolic murmur. ABDOMEN:  Soft and nontender. EXTREMITIES:  Reveal 1+ edema of lower leg with some discoloration of toenail. SKIN:  Warm and dry. NEUROLOGIC:  The patient moves all four extremities.  Cranial nerves grossly intact.  LABORATORY DATA:  Hemoglobin low at 8.7, hematocrit 20.3, normal WBC count and platelet count.  INR was 1.15.  Sodium low at 120, potassium 3.0, chloride low at 90, CO2 21, BUN 29, creatinine 1.32.  Subsequent sodium was 137, potassium 4.0, BUN 50, creatinine 1.47, albumin low at 3.2, AST and ALT slightly elevated at 46 and 42 respectively.  CK 119, but MB elevated at 4.7 and 5.1, troponin-I elevated at 0.08 and 0.13.  B- natriuretic peptide was markedly elevated at 1848.  CK-MB peaked at 6.6 and troponin-I peaked at 0.42.  Urinalysis was unremarkable.  Dig level was low normal.   X-ray of the chest revealed stable cardiac enlargement and pulmonary edema,  low lung volume.   EKG showed junctional rhythm with anterolateral ischemia.  Subsequent EKG  showed sinus rhythm with nonspecific intraventricular conduction block and  lateral ischemia.  Echocardiogram showed severe LV systolic dysfunction with a mild aortic insufficiency, moderately calcified mitral valve annulus with mild    regurgitation, moderate tricuspid regurgitation, and moderately elevated  pulmonary artery systolic pressure.  HOSPITAL COURSE:  The patient was admitted to telemetry unit.  She ruled in for non-Q-wave myocardial infarction.  She received IV Lasix for her acute on chronic congestive heart failure.  Her condition gradually improved. Due to inactivity and Lasix use, the patient developed severe constipation, requiring several laxative modalities.  Her condition gradually improved with increase in ambulation over the next 3 days and on July 11, 2010, she was discharged home in satisfactory condition with a follow up by me in 1 week.     Ricki Rodriguez, M.D.     ASK/MEDQ  D:  08/06/2010  T:  08/07/2010  Job:  [id number]  Electronically Signed by Orpah Cobb M.D. on 08/09/2010 09:12:20 PM

## 2010-08-15 ENCOUNTER — Other Ambulatory Visit: Payer: Self-pay | Admitting: Internal Medicine

## 2010-08-15 DIAGNOSIS — Z1231 Encounter for screening mammogram for malignant neoplasm of breast: Secondary | ICD-10-CM

## 2010-08-18 LAB — GLUCOSE, CAPILLARY
Glucose-Capillary: 223 mg/dL — ABNORMAL HIGH (ref 70–99)
Glucose-Capillary: 237 mg/dL — ABNORMAL HIGH (ref 70–99)
Glucose-Capillary: 238 mg/dL — ABNORMAL HIGH (ref 70–99)
Glucose-Capillary: 255 mg/dL — ABNORMAL HIGH (ref 70–99)
Glucose-Capillary: 273 mg/dL — ABNORMAL HIGH (ref 70–99)
Glucose-Capillary: 280 mg/dL — ABNORMAL HIGH (ref 70–99)
Glucose-Capillary: 292 mg/dL — ABNORMAL HIGH (ref 70–99)
Glucose-Capillary: 292 mg/dL — ABNORMAL HIGH (ref 70–99)
Glucose-Capillary: 309 mg/dL — ABNORMAL HIGH (ref 70–99)
Glucose-Capillary: 329 mg/dL — ABNORMAL HIGH (ref 70–99)
Glucose-Capillary: 330 mg/dL — ABNORMAL HIGH (ref 70–99)
Glucose-Capillary: 339 mg/dL — ABNORMAL HIGH (ref 70–99)
Glucose-Capillary: 362 mg/dL — ABNORMAL HIGH (ref 70–99)
Glucose-Capillary: 400 mg/dL — ABNORMAL HIGH (ref 70–99)
Glucose-Capillary: 416 mg/dL — ABNORMAL HIGH (ref 70–99)

## 2010-08-18 LAB — CBC
Hemoglobin: 10.9 g/dL — ABNORMAL LOW (ref 12.0–15.0)
Hemoglobin: 12.8 g/dL (ref 12.0–15.0)
MCHC: 33.5 g/dL (ref 30.0–36.0)
MCHC: 33.6 g/dL (ref 30.0–36.0)
RBC: 3.76 MIL/uL — ABNORMAL LOW (ref 3.87–5.11)
RBC: 4.42 MIL/uL (ref 3.87–5.11)
WBC: 8.1 10*3/uL (ref 4.0–10.5)

## 2010-08-18 LAB — BASIC METABOLIC PANEL
BUN: 63 mg/dL — ABNORMAL HIGH (ref 6–23)
BUN: 64 mg/dL — ABNORMAL HIGH (ref 6–23)
CO2: 31 mEq/L (ref 19–32)
CO2: 32 mEq/L (ref 19–32)
CO2: 33 mEq/L — ABNORMAL HIGH (ref 19–32)
Calcium: 10 mg/dL (ref 8.4–10.5)
Calcium: 10.4 mg/dL (ref 8.4–10.5)
Chloride: 100 mEq/L (ref 96–112)
Chloride: 94 mEq/L — ABNORMAL LOW (ref 96–112)
Chloride: 97 mEq/L (ref 96–112)
Creatinine, Ser: 1.96 mg/dL — ABNORMAL HIGH (ref 0.4–1.2)
Creatinine, Ser: 2.22 mg/dL — ABNORMAL HIGH (ref 0.4–1.2)
GFR calc Af Amer: 26 mL/min — ABNORMAL LOW (ref >60)
GFR calc Af Amer: 29 mL/min — ABNORMAL LOW (ref >60)
GFR calc Af Amer: 30 mL/min — ABNORMAL LOW (ref >60)
GFR calc non Af Amer: 24 mL/min — ABNORMAL LOW (ref >60)
Glucose, Bld: 212 mg/dL — ABNORMAL HIGH (ref 70–99)
Glucose, Bld: 219 mg/dL — ABNORMAL HIGH (ref 70–99)
Potassium: 4.6 mEq/L (ref 3.5–5.1)
Sodium: 133 mEq/L — ABNORMAL LOW (ref 135–145)
Sodium: 139 mEq/L (ref 135–145)

## 2010-08-18 LAB — BRAIN NATRIURETIC PEPTIDE: Pro B Natriuretic peptide (BNP): 1918 pg/mL — ABNORMAL HIGH (ref 0.0–100.0)

## 2010-08-19 LAB — URINALYSIS, ROUTINE W REFLEX MICROSCOPIC
Bilirubin Urine: NEGATIVE
Glucose, UA: 100 mg/dL — AB
Hgb urine dipstick: NEGATIVE
Protein, ur: NEGATIVE mg/dL
Urobilinogen, UA: 0.2 mg/dL (ref 0.0–1.0)

## 2010-08-19 LAB — POCT I-STAT 3, ART BLOOD GAS (G3+)
O2 Saturation: 97 %
pCO2 arterial: 30.1 mmHg — ABNORMAL LOW (ref 35.0–45.0)
pH, Arterial: 7.447 — ABNORMAL HIGH (ref 7.350–7.400)
pO2, Arterial: 78 mmHg — ABNORMAL LOW (ref 80.0–100.0)

## 2010-08-19 LAB — COMPREHENSIVE METABOLIC PANEL
ALT: 28 U/L (ref 0–35)
BUN: 67 mg/dL — ABNORMAL HIGH (ref 6–23)
CO2: 22 mEq/L (ref 19–32)
Calcium: 10.1 mg/dL (ref 8.4–10.5)
Creatinine, Ser: 2.3 mg/dL — ABNORMAL HIGH (ref 0.4–1.2)
GFR calc non Af Amer: 20 mL/min — ABNORMAL LOW (ref >60)
Glucose, Bld: 344 mg/dL — ABNORMAL HIGH (ref 70–99)
Sodium: 136 mEq/L (ref 135–145)
Total Protein: 7.3 g/dL (ref 6.0–8.3)

## 2010-08-19 LAB — POCT CARDIAC MARKERS
CKMB, poc: 3.1 ng/mL (ref 1.0–8.0)
CKMB, poc: 3.7 ng/mL (ref 1.0–8.0)
Myoglobin, poc: 138 ng/mL (ref 12–200)
Myoglobin, poc: 172 ng/mL (ref 12–200)
Troponin i, poc: <0.05 ng/mL (ref 0.00–0.09)
Troponin i, poc: <0.05 ng/mL (ref 0.00–0.09)

## 2010-08-19 LAB — GLUCOSE, CAPILLARY
Glucose-Capillary: 109 mg/dL — ABNORMAL HIGH (ref 70–99)
Glucose-Capillary: 123 mg/dL — ABNORMAL HIGH (ref 70–99)
Glucose-Capillary: 145 mg/dL — ABNORMAL HIGH (ref 70–99)
Glucose-Capillary: 164 mg/dL — ABNORMAL HIGH (ref 70–99)
Glucose-Capillary: 167 mg/dL — ABNORMAL HIGH (ref 70–99)
Glucose-Capillary: 205 mg/dL — ABNORMAL HIGH (ref 70–99)
Glucose-Capillary: 214 mg/dL — ABNORMAL HIGH (ref 70–99)
Glucose-Capillary: 218 mg/dL — ABNORMAL HIGH (ref 70–99)
Glucose-Capillary: 243 mg/dL — ABNORMAL HIGH (ref 70–99)
Glucose-Capillary: 264 mg/dL — ABNORMAL HIGH (ref 70–99)
Glucose-Capillary: 268 mg/dL — ABNORMAL HIGH (ref 70–99)
Glucose-Capillary: 494 mg/dL — ABNORMAL HIGH (ref 70–99)

## 2010-08-19 LAB — BASIC METABOLIC PANEL
BUN: 32 mg/dL — ABNORMAL HIGH (ref 6–23)
BUN: 62 mg/dL — ABNORMAL HIGH (ref 6–23)
BUN: 65 mg/dL — ABNORMAL HIGH (ref 6–23)
CO2: 22 mEq/L (ref 19–32)
CO2: 23 mEq/L (ref 19–32)
CO2: 28 mEq/L (ref 19–32)
Calcium: 8.9 mg/dL (ref 8.4–10.5)
Calcium: 9 mg/dL (ref 8.4–10.5)
Chloride: 104 mEq/L (ref 96–112)
Chloride: 104 mEq/L (ref 96–112)
Chloride: 109 mEq/L (ref 96–112)
Chloride: 96 mEq/L (ref 96–112)
Chloride: 99 mEq/L (ref 96–112)
Creatinine, Ser: 1.39 mg/dL — ABNORMAL HIGH (ref 0.4–1.2)
Creatinine, Ser: 2.29 mg/dL — ABNORMAL HIGH (ref 0.4–1.2)
Creatinine, Ser: 2.35 mg/dL — ABNORMAL HIGH (ref 0.4–1.2)
GFR calc Af Amer: 25 mL/min — ABNORMAL LOW (ref >60)
GFR calc Af Amer: 38 mL/min — ABNORMAL LOW (ref >60)
GFR calc Af Amer: 44 mL/min — ABNORMAL LOW (ref >60)
GFR calc non Af Amer: 23 mL/min — ABNORMAL LOW (ref >60)
GFR calc non Af Amer: 32 mL/min — ABNORMAL LOW (ref >60)
GFR calc non Af Amer: 36 mL/min — ABNORMAL LOW (ref >60)
Glucose, Bld: 158 mg/dL — ABNORMAL HIGH (ref 70–99)
Glucose, Bld: 235 mg/dL — ABNORMAL HIGH (ref 70–99)
Glucose, Bld: 238 mg/dL — ABNORMAL HIGH (ref 70–99)
Glucose, Bld: 296 mg/dL — ABNORMAL HIGH (ref 70–99)
Potassium: 4 mEq/L (ref 3.5–5.1)
Potassium: 4 mEq/L (ref 3.5–5.1)
Potassium: 4.3 mEq/L (ref 3.5–5.1)
Potassium: 4.5 mEq/L (ref 3.5–5.1)
Potassium: 5 mEq/L (ref 3.5–5.1)
Sodium: 130 mEq/L — ABNORMAL LOW (ref 135–145)
Sodium: 138 mEq/L (ref 135–145)
Sodium: 140 mEq/L (ref 135–145)
Sodium: 144 mEq/L (ref 135–145)

## 2010-08-19 LAB — PROTIME-INR
INR: 1.19 (ref 0.00–1.49)
Prothrombin Time: 15.1 seconds (ref 11.6–15.2)

## 2010-08-19 LAB — BRAIN NATRIURETIC PEPTIDE
Pro B Natriuretic peptide (BNP): 1346 pg/mL — ABNORMAL HIGH (ref 0.0–100.0)
Pro B Natriuretic peptide (BNP): 149 pg/mL — ABNORMAL HIGH (ref 0.0–100.0)

## 2010-08-19 LAB — URINE CULTURE
Colony Count: NO GROWTH
Culture: NO GROWTH

## 2010-08-19 LAB — DIFFERENTIAL
Lymphs Abs: 0.8 10*3/uL (ref 0.7–4.0)
Monocytes Absolute: 0.4 10*3/uL (ref 0.1–1.0)
Monocytes Relative: 4 % (ref 3–12)
Neutro Abs: 8.4 10*3/uL — ABNORMAL HIGH (ref 1.7–7.7)
Neutrophils Relative %: 87 % — ABNORMAL HIGH (ref 43–77)

## 2010-08-19 LAB — APTT: aPTT: 26 seconds (ref 24–37)

## 2010-08-19 LAB — CBC
HCT: 33.3 % — ABNORMAL LOW (ref 36.0–46.0)
Hemoglobin: 12.1 g/dL (ref 12.0–15.0)
MCHC: 33.4 g/dL (ref 30.0–36.0)
MCHC: 33.9 g/dL (ref 30.0–36.0)
MCV: 86.3 fL (ref 78.0–100.0)
Platelets: 153 10*3/uL (ref 150–400)
RBC: 4.21 MIL/uL (ref 3.87–5.11)
RDW: 16.5 % — ABNORMAL HIGH (ref 11.5–15.5)
WBC: 9.6 10*3/uL (ref 4.0–10.5)

## 2010-08-19 LAB — URINE MICROSCOPIC-ADD ON

## 2010-08-19 LAB — CK TOTAL AND CKMB (NOT AT ARMC): CK, MB: 4 ng/mL (ref 0.3–4.0)

## 2010-08-19 LAB — CARDIAC PANEL(CRET KIN+CKTOT+MB+TROPI)
CK, MB: 3.8 ng/mL (ref 0.3–4.0)
Relative Index: INVALID (ref 0.0–2.5)
Total CK: 77 U/L (ref 7–177)
Troponin I: 0.64 ng/mL (ref 0.00–0.06)

## 2010-08-19 LAB — TROPONIN I: Troponin I: 0.33 ng/mL — ABNORMAL HIGH (ref 0.00–0.06)

## 2010-08-20 LAB — CK TOTAL AND CKMB (NOT AT ARMC): CK, MB: 3.8 ng/mL (ref 0.3–4.0)

## 2010-08-20 LAB — BASIC METABOLIC PANEL
BUN: 28 mg/dL — ABNORMAL HIGH (ref 6–23)
CO2: 26 mEq/L (ref 19–32)
Calcium: 8.5 mg/dL (ref 8.4–10.5)
Chloride: 105 mEq/L (ref 96–112)
Chloride: 99 mEq/L (ref 96–112)
Creatinine, Ser: 1.44 mg/dL — ABNORMAL HIGH (ref 0.4–1.2)
Creatinine, Ser: 1.54 mg/dL — ABNORMAL HIGH (ref 0.4–1.2)
GFR calc Af Amer: 39 mL/min — ABNORMAL LOW (ref >60)
GFR calc Af Amer: 42 mL/min — ABNORMAL LOW (ref >60)
GFR calc Af Amer: 42 mL/min — ABNORMAL LOW (ref >60)
GFR calc non Af Amer: 35 mL/min — ABNORMAL LOW (ref >60)
Potassium: 4.1 mEq/L (ref 3.5–5.1)
Potassium: 4.4 mEq/L (ref 3.5–5.1)
Sodium: 137 mEq/L (ref 135–145)

## 2010-08-20 LAB — GLUCOSE, CAPILLARY
Glucose-Capillary: 153 mg/dL — ABNORMAL HIGH (ref 70–99)
Glucose-Capillary: 193 mg/dL — ABNORMAL HIGH (ref 70–99)
Glucose-Capillary: 208 mg/dL — ABNORMAL HIGH (ref 70–99)
Glucose-Capillary: 212 mg/dL — ABNORMAL HIGH (ref 70–99)
Glucose-Capillary: 215 mg/dL — ABNORMAL HIGH (ref 70–99)
Glucose-Capillary: 244 mg/dL — ABNORMAL HIGH (ref 70–99)
Glucose-Capillary: 353 mg/dL — ABNORMAL HIGH (ref 70–99)
Glucose-Capillary: 402 mg/dL — ABNORMAL HIGH (ref 70–99)
Glucose-Capillary: 465 mg/dL — ABNORMAL HIGH (ref 70–99)

## 2010-08-20 LAB — CBC
HCT: 31.4 % — ABNORMAL LOW (ref 36.0–46.0)
HCT: 37.4 % (ref 36.0–46.0)
Hemoglobin: 10 g/dL — ABNORMAL LOW (ref 12.0–15.0)
Hemoglobin: 12.5 g/dL (ref 12.0–15.0)
Hemoglobin: 9.6 g/dL — ABNORMAL LOW (ref 12.0–15.0)
MCHC: 33.7 g/dL (ref 30.0–36.0)
MCV: 86.9 fL (ref 78.0–100.0)
Platelets: 157 10*3/uL (ref 150–400)
Platelets: 165 10*3/uL (ref 150–400)
Platelets: 198 10*3/uL (ref 150–400)
RBC: 3.45 MIL/uL — ABNORMAL LOW (ref 3.87–5.11)
RBC: 3.46 MIL/uL — ABNORMAL LOW (ref 3.87–5.11)
RBC: 3.61 MIL/uL — ABNORMAL LOW (ref 3.87–5.11)
RBC: 4.35 MIL/uL (ref 3.87–5.11)
RDW: 16.4 % — ABNORMAL HIGH (ref 11.5–15.5)
RDW: 16.7 % — ABNORMAL HIGH (ref 11.5–15.5)
WBC: 10.1 10*3/uL (ref 4.0–10.5)
WBC: 6.6 10*3/uL (ref 4.0–10.5)
WBC: 8.9 10*3/uL (ref 4.0–10.5)
WBC: 9.9 10*3/uL (ref 4.0–10.5)

## 2010-08-20 LAB — CULTURE, BLOOD (ROUTINE X 2)
Culture: NO GROWTH
Culture: NO GROWTH
Culture: NO GROWTH

## 2010-08-20 LAB — CARDIAC PANEL(CRET KIN+CKTOT+MB+TROPI)
Relative Index: 1.8 (ref 0.0–2.5)
Total CK: 148 U/L (ref 7–177)
Troponin I: 0.02 ng/mL (ref 0.00–0.06)
Troponin I: 0.02 ng/mL (ref 0.00–0.06)

## 2010-08-20 LAB — HEPARIN LEVEL (UNFRACTIONATED)
Heparin Unfractionated: 0.15 IU/mL — ABNORMAL LOW (ref 0.30–0.70)
Heparin Unfractionated: 1.12 IU/mL — ABNORMAL HIGH (ref 0.30–0.70)

## 2010-08-20 LAB — APTT: aPTT: 28 seconds (ref 24–37)

## 2010-08-20 LAB — URINALYSIS, ROUTINE W REFLEX MICROSCOPIC
Bilirubin Urine: NEGATIVE
Glucose, UA: >1000 mg/dL — AB
Ketones, ur: NEGATIVE mg/dL
Protein, ur: NEGATIVE mg/dL

## 2010-08-20 LAB — POCT CARDIAC MARKERS
CKMB, poc: 2.5 ng/mL (ref 1.0–8.0)
Myoglobin, poc: 114 ng/mL (ref 12–200)
Troponin i, poc: <0.05 ng/mL (ref 0.00–0.09)

## 2010-08-20 LAB — DIFFERENTIAL
Eosinophils Relative: 2 % (ref 0–5)
Lymphocytes Relative: 18 % (ref 12–46)
Lymphs Abs: 1.8 10*3/uL (ref 0.7–4.0)
Monocytes Relative: 6 % (ref 3–12)

## 2010-08-20 LAB — URINE MICROSCOPIC-ADD ON

## 2010-08-20 LAB — BRAIN NATRIURETIC PEPTIDE: Pro B Natriuretic peptide (BNP): 211 pg/mL — ABNORMAL HIGH (ref 0.0–100.0)

## 2010-08-20 LAB — TROPONIN I: Troponin I: 0.03 ng/mL (ref 0.00–0.06)

## 2010-08-20 LAB — D-DIMER, QUANTITATIVE: D-Dimer, Quant: 0.72 ug/mL-FEU — ABNORMAL HIGH (ref 0.00–0.48)

## 2010-08-22 LAB — COMPREHENSIVE METABOLIC PANEL
ALT: 20 U/L (ref 0–35)
AST: 24 U/L (ref 0–37)
Albumin: 3.5 g/dL (ref 3.5–5.2)
Alkaline Phosphatase: 44 U/L (ref 39–117)
CO2: 29 mEq/L (ref 19–32)
Chloride: 102 mEq/L (ref 96–112)
GFR calc Af Amer: 41 mL/min — ABNORMAL LOW (ref >60)
GFR calc non Af Amer: 34 mL/min — ABNORMAL LOW (ref >60)
Potassium: 4.3 mEq/L (ref 3.5–5.1)
Sodium: 142 mEq/L (ref 135–145)
Total Bilirubin: 0.4 mg/dL (ref 0.3–1.2)

## 2010-08-22 LAB — CBC
MCV: 86 fL (ref 78.0–100.0)
RBC: 4.02 MIL/uL (ref 3.87–5.11)
WBC: 7.2 10*3/uL (ref 4.0–10.5)

## 2010-08-22 LAB — LIPID PANEL
Cholesterol: 122 mg/dL (ref 0–200)
LDL Cholesterol: 50 mg/dL (ref 0–99)
Total CHOL/HDL Ratio: 2.3 RATIO
Triglycerides: 97 mg/dL (ref <150)
VLDL: 19 mg/dL (ref 0–40)

## 2010-08-22 LAB — GLUCOSE, CAPILLARY: Glucose-Capillary: 275 mg/dL — ABNORMAL HIGH (ref 70–99)

## 2010-08-29 ENCOUNTER — Ambulatory Visit
Admission: RE | Admit: 2010-08-29 | Discharge: 2010-08-29 | Disposition: A | Discharge status: Home / Self Care | Payer: Medicare Other | Source: Ambulatory Visit | Attending: Internal Medicine | Admitting: Internal Medicine

## 2010-08-29 DIAGNOSIS — Z1231 Encounter for screening mammogram for malignant neoplasm of breast: Secondary | ICD-10-CM

## 2010-09-30 NOTE — Consult Note (Signed)
NAMEPIERRA, SKORA               ACCOUNT NO.:  [id number]   MEDICAL RECORD NO.:  [id number]          PATIENT TYPE:  INP   LOCATION:  4729                         FACILITY:  [facility]   PHYSICIAN:  John C. Madilyn Fireman, M.D.    DATE OF BIRTH:  06/22/1925   DATE OF CONSULTATION:  07/01/2007  DATE OF DISCHARGE:                                 CONSULTATION   REASON FOR CONSULTATION:  We were asked to see Ms. Ellerman today in  consultation for GI bleed by Dr. Margaretmary Bayley.   HISTORY OF PRESENT ILLNESS:  This is a 77 year old, African American  female on Coumadin for recurrent pulmonary embolism.  She has had black  stools for the past 2-3 days.  She reports that she had 4 black stools  on Wednesday and 4 black stools on Thursday.  She went to her primary  care physician's office for a followup appointment yesterday on February  12, and had multiple black stools and also vomited red blood twice in  his office.  The patient denies NSAID use or any upper tract symptoms.  She tells me that she has had diffuse abdominal pain, but no particular  epigastric pain, no postprandial bloating, nausea or pain.  Her appetite  has been good.  She has been eating well, even a large solid lunch  today.  She has had no vomiting or black stools since yesterday.  Her  INR yesterday was 10.8.   PAST MEDICAL HISTORY:  1. Pulmonary embolus on June 10, 2007.  2. History of cerebrovascular accident.  3. Insulin-dependent diabetic.  4. Myocardial infarction.  5. Coronary artery disease.  6. Lower GI bleed in March 2006, that Dr. Evette Cristal believed was secondary      to diverticulosis.  7. The patient tells me she is blind.  8. She had a colonoscopy done in 1993, that showed internal      hemorrhoids and diverticulosis.  She had another colonoscopy done      in 2001, that showed internal and external hemorrhoids with      significant left diverticulosis with fresh blood in the descending      colon.   ALLERGIES:  No  known drug allergies.   MEDICATIONS:  1. Aspirin 81 per day.  2. Coumadin.  3. Humulin.  4. Caduet.  5. Geritol.  6. Coreg.  7. Diovan HCT.  8. Ferrous sulfate.   FAMILY HISTORY:  Negative for ulcer disease, negative for colon cancer.   SOCIAL HISTORY:  The patient has two daughters that her at bedside.  She  does not smoke tobacco or drink alcohol.   PHYSICAL EXAMINATION:  GENERAL:  She is alert and oriented in no  apparent distress.  Her two daughters are present.  VITAL SIGNS:  Temperature is 97.6, pulse is 103, respirations 20, blood  pressure is 126/54,  HEART:  Regular rate and rhythm with no appreciable murmurs, rubs or  gallops.  LUNGS:  Clear to auscultation.  ABDOMEN:  Soft, nontender, nondistended with good bowel sounds.   LABORATORY DATA AND X-RAY FINDINGS:  Hemoglobin on June 20, 2007, was  10.2.  On June 30, 2007, was 7.5.  Today, it was 6.7.  She has since  received 4 units of packed red blood cells.  Hematocrit is 19.9, white  count 12.4, platelets 129,000.  INR today is 3.  It was 10.8 yesterday.  PT today is 31.8.  BMET significant for a BUN of 78, creatinine 1.31,  troponin 0.71.   ASSESSMENT:  1. Dr. Dorena Cookey has seen and examined the patient and collected a      history.  His impression is that this is a 77 year old female with      coagulopathy and supratherapeutic INR.  2. Acute blood loss anemia secondary to gastrointestinal bleed.  3. Elevated cardiac enzymes.  4. Multiple chronic medical problems.  The patient's INR is now      reversed.   RECOMMENDATIONS:  Agree with transfusions.  The patient receiving fourth  unit of packed red blood cells now.  We will monitor stool as well as  hemoglobin for signs of continued bleeding.  The patient is currently on  Protonix IV q. 12.  Will consider an upper endoscopy pending the  patient's progress.   Thanks very much for this consultation.  We will follow this patient  with you.       Stephani Police, PA    ______________________________  Everardo All Madilyn Fireman, M.D.    MLY/MEDQ  D:  07/01/2007  T:  07/04/2007  Job:  [id number]

## 2010-09-30 NOTE — Op Note (Signed)
[name], [name] NO.:  [id number]   MEDICAL RECORD NO.:  [id number]          PATIENT TYPE:  INP   LOCATION:  4729                         FACILITY:  [facility]   PHYSICIAN:  Graylin Shiver, M.D.   DATE OF BIRTH:  [birth year]   DATE OF PROCEDURE:  07/05/2007  DATE OF DISCHARGE:                               OPERATIVE REPORT   PROCEDURE:  Esophagogastroduodenoscopy.   INDICATIONS FOR PROCEDURE:  Melena in the face of Coumadin toxicity,  rule out upper gastrointestinal lesion.   Informed consent was obtained after explanation of the risks of  bleeding, infection and perforation.   PREMEDICATION:  Fentanyl 20 mcg IV, Versed 0.5 mg IV.   PROCEDURE IN DETAIL:  With the patient in the left lateral decubitus  position, the Pentax pediatric gastroscope was inserted into the  oropharynx and passed into the esophagus.  It was advanced down the  esophagus then into the stomach and into the duodenum.  The second  portion and bulb of the duodenum looked normal.  The stomach looked  normal.  The esophagus looked normal.  She tolerated the procedure well  without complications.   IMPRESSION:  Normal esophagogastroduodenoscopy.   There is no evidence of active bleeding seen on this examination and no  lesions seen.  The patient had a colonoscopy in 2001 which showed  diverticulosis.  Given her overall general poor medical condition I do  not recommend another colonoscopy.           ______________________________  Graylin Shiver, M.D.     SFG/MEDQ  D:  07/05/2007  T:  07/06/2007  Job:  [id number]   cc:   Margaretmary Bayley, M.D.  Ricki Rodriguez, M.D.

## 2010-09-30 NOTE — Consult Note (Signed)
NAMEBERTICE, RISSE               ACCOUNT NO.:  [id number]   MEDICAL RECORD NO.:  [id number]          PATIENT TYPE:  INP   LOCATION:  4729                         FACILITY:  [facility]   PHYSICIAN:  Genene Churn. Granfortuna, M.D.DATE OF BIRTH:  May 04, 1926   DATE OF CONSULTATION:  07/04/2007  DATE OF DISCHARGE:                                 CONSULTATION   REASON FOR CONSULTATION:  Coagulopathy.   HISTORY OF PRESENT ILLNESS:  [name] is a pleasant 77 year old  African American female we are asked to see for evaluation of  coagulopathy.  The patient has other significant medical history  including CAD, insulin-dependent diabetes and hyperlipidemia.  She also  has a history of diverticulosis with diverticula-related rectal bleeding  in 1993, 2001 and 2006.  She had a recent acute pulmonary embolism on  June 09, 2007, requiring heparin and then Coumadin with a total of 28  mg before discharge on June 17, 2007.  Baseline PT and PTT prior to  starting any anticoagulation were normal with PT 13.6 and PTT 28 seconds  on June 09, 2007.  PT at discharge was 32.7 with an INR of 3.1 and  H&H of 10 and 31, respectively on June 19, 2007.  She is now  readmitted on June 30, 2007, with coffee-ground emesis, melena, and  a PT of greater than 90 with an INR of greater than 10.  To date, she  received a total of 3 units of fresh frozen plasma, 17 mg of vitamin K,  as well as 6 units of red blood cells.  Platelet count  noted to be  normal on June 20, 2007, at a value of 276,000.  Now these are  falling as of July 01, 2007, with a value of 126,000, and today  July 04, 2007, at 97,000.  The PT and INR this morning before  addition of 5 mg of vitamin K was 43 seconds, with an INR of 4.6 and H&H  is 7.1 and 20.6, respectively.  She is not having any overt bleeding at this time.  An upper endoscopy  is planned.   CURRENT MEDICATIONS:  Include Protonix, insulin, Lasix, Lopressor  and  nitroglycerin.   PAST MEDICAL HISTORY:  1. Status post PE in June 09, 2007, requiring heparinization, and      then discharged on Coumadin.  2. Prior history of CVA x3 with no residual.  3. IDDM.  4. History of MI, CAD.  5. History of lower GI bleed in March 2006 secondary to      diverticulosis, followed by Dr. Evette Cristal.  6. Diabetic retinopathy.   PAST SURGICAL HISTORY:  1. Status post bladder suspension.  2. Status post bilateral cataract surgery.   ALLERGIES:  NKDA.   MEDICATIONS:  1. Ventolin nebulizer as directed.  2. Fentanyl as directed.  3. Lasix 20 mg daily.  4. NovoLog as directed.  5. Lantus as directed.  6. Lopressor 12.5 mg b.i.d.  7. Versed p.r.n.  8. Nitroglycerin drip.  9. Protonix 40 mg q.12h.  10.Vitamin K 5 mg IV daily or as directed.  11.Altace 1.25 mg  p.o. daily.  12.Senokot 1 tablet nightly.  13.[name] 40 mg nightly.  14.Tylenol p.r.n.  15.Xanax p.r.n.  16.Zofran p.r.n.  17.Nitroglycerin p.r.n.   REVIEW OF SYSTEMS:  Remarkable for no acute bleeding.  No confusion or  headaches.  She has diabetic retinopathy, worse on the right.  No  dysphagia or GERD symptoms.  She has dyspnea on exertion and shortness  of breath, worse on admission.  She had chest pain on admission, now  resolved.  She had no nausea, but she vomited blood twice before  admission.  No diarrhea.  Chronic constipation.  Last bowel movement on  July 03, 2007, showing no microscopic blood.  No NSAIDs.  She was  taking aspirin and Coumadin as an outpatient prior to admission.  No  abdominal pain.  No hematuria or dysuria.  No back pain, numbness or  edema.  No fever, chills or night sweats.   FAMILY HISTORY:  Mother died when she was 28 years old, father is  unknown.  She has four sisters, all deceased, and she had nine brothers,  eight died, one alive and well.  No bleeding disorders in the family.  No colon cancer history.   SOCIAL HISTORY:  The patient is widowed, she  has three sons and two  daughters in good health.  No tobacco.  No alcohol.   HEALTH MAINTENANCE:  She had two colonoscopies in 1993 and 2001,  remarkable for left diverticulosis.  A planned colonoscopy is in the  works in the next few days.  Last mammogram May 31, 2007, was  negative.   PHYSICAL EXAMINATION:  GENERAL APPEARANCE:  This is an 77 year old  African American female in no acute distress, alert and oriented x3.  VITAL SIGNS:  Blood pressure 86/45, the maximum today was 95/55, pulse  74, respirations 20, temperature 97.4, pulse oximetry 100% on 2 liters,  weight 75.3 kg, height 63 inches.  HEENT:  Normocephalic, atraumatic.  PERRLA.  Oral mucosa without thrush  or lesions.  NECK:  Supple.  No cervical or supraclavicular masses.  LUNGS:  Remarkable for bibasilar rales, more pronounced on the left, no  wheezing or rhonchi.  No axillary masses.  CARDIOVASCULAR:  Regular rate and rhythm without murmurs, rubs or  gallops.  ABDOMEN:  Soft, nontender.  Bowel sounds x4.  No hepatosplenomegaly.  EXTREMITIES:  No clubbing or cyanosis.  No edema.  No inguinal masses.  SKIN:  Without bruising or petechial rash.  She is pale.  The nail beds  are pale as well.  BREASTS:  Not examined.  MUSCULOSKELETAL:  Without spinal tenderness.  NEUROLOGIC:  No nonfocal except for decreased reflexes bilaterally.   LABORATORY DATA:  Hemoglobin 7.1, hematocrit 20.6, white count 7.3,  platelets 97, MCV 90.2, ANC 9.9, monocytes 0.5, lymphocytes 1.4, retic  count 70.9.  Folic acid greater than 20, B12 1440, iron 27, TIBC 319,  percentage saturation 8, ferritin 52.  PTT 61.  PT 43.2, INR of 4.3, sed  rate 32, hemoccult positive.  Sodium 138, potassium 3.9, BUN 41,  creatinine 1.15, glucose 134.  CRP June 14, 2007, was 4.1, troponin I  0.8, CK-MB 2.8.   RADIOLOGY:  Acute abdominal series on July 01, 2007, shows moderate  stool, no free intraperitoneal air or pulmonary edema.  Cardiomegaly.   Chest x-ray on June 30, 2007, shows no acute pulmonary disease.  MRI  of the brain on July 2008, shows subacute ischemic changes in the  bilateral corona radiata.   ASSESSMENT/PLAN:  Dr.  Granfortuna has seen and evaluated the patient and  the chart has been reviewed. Impression:  1. Hypersensitivity to Coumadin:  Likely slow metabolizer on genetic      basis.  Since normal PT and PTT at baseline before any      anticoagulation started at time of her recent pulmonary embolism,      there is no suspicion that she has an underlying coagulopathy.  2. Fall in platelet count:  Reasons unclear.  She is not infected, and      the only possible offending medication is Protonix.  Will rule out      disseminated intravascular coagulation and HAT. Although recent      heparin exposure, she was not rechallenged with heparin during      current admission.  3. Recent pulmonary embolism, with ongoing need for anticoagulation      now complicated by acute bleeding and fall in platelet count.   RECOMMENDATIONS:  1. Agree with reversal of Coumadin.  2. Will check genetic studies for Coumadin sensitivity, but clinically      we know this already.  3. If the hemoglobin is stable for 3-4 days after Coumadin is      reversed, would start her on low molecular weight heparin in      therapeutic doses and continue these for at least a month before      trying to resume Coumadin, assuming her platelet count normalizes.      Would then start coumadin at 1 mg daily and allow 1 week in between      dose changes to slowly titrate to therapeutic range. If recurrent      bleeding despite reversing Coumadin, I would  have radiology place      a vena cava filter.  4. Stop Protonix on general principle as possibly causing      thrombocytopenia.  5. Continue to monitor counts.  There is a low clinical suspicion for heparin associated  thrombocytopenia at this point but would check HAT screen if persistent   unexplained thrombocytopenia.   Thank you very much for allowing [place] the opportunity to participate in  the care of this nice patient.  Will follow with you.      [name], P.A.      Genene Churn. Cyndie Chime, M.D.  Electronically Signed    SW/MEDQ  D:  07/05/2007  T:  07/05/2007  Job:  [id number]   cc:   Graylin Shiver, M.D.

## 2010-09-30 NOTE — Discharge Summary (Signed)
NAMENEVAYAH, FAUST NO.:  [id number]   MEDICAL RECORD NO.:  [id number]          PATIENT TYPE:  INP   LOCATION:  4729                         FACILITY:  [facility]   PHYSICIAN:  Margaretmary Bayley, M.D.    DATE OF BIRTH:  [birth year]   DATE OF ADMISSION:  06/30/2007  DATE OF DISCHARGE:  07/11/2007                               DISCHARGE SUMMARY   DISCHARGE DIAGNOSES:  1. Diverticulosis of the colon with hemorrhage.  2. Acute subendocardial myocardial infarction.  3. Anemia secondary to gastrointestinal bleeding.  4. Acute systolic heart failure.  5. Insulin-requiring diabetes mellitus.  6. Arteriosclerotic cardiovascular disease.  7. Previous cerebrovascular accident.  8. History of a transient ischemic attack.  9. Mitral valve insufficiency.  10.History of systemic hypertension.  11.History of pulmonary embolization.   REASON FOR ADMISSION:  Mrs. Weider is a 77 year old African-American  woman with a previous history of arteriosclerotic cardiovascular  disease, which included previous CVA, and suspected coronary artery  disease, who comes into the emergency room with a complaint of shortness  of breath and progressive weakness.  The patient had been recently  discharged from [facility] after a spiral CT scan confirmed a  diagnosis of acute pulmonary embolization.  The patient had been started  on continuous infusion of heparin along with coumadinization and was  subsequently discharged to home on Coumadin.  Two days prior to her  readmission to [facility], the patient had noted several stools with  dark, coffee ground-looking material, but because she has been  discharged home on iron, she anticipated and thought that it was her  iron that was causing her stools to become black.  She was brought into  the emergency room, where she was evaluated, and found to have a  significant anemia with stools that were grossly positive for blood and  subsequently  admitted into the hospital.   PERTINENT PHYSICAL FINDINGS:  GENERAL:  She is an elderly, African-  American woman appearing younger than stated age of 26.  She did appear  quite anxious and somewhat dyspneic, but there was no evidence of acute  air hunger.  VITAL SIGNS:  Her blood pressure was 105/68 in a lying position, pulse  rate of 88, respiratory rate 20, and temperature of 97.8.  On sitting,  her blood pressure dropped to 100/56 with a pulse rate of 106.  HEENT:  There was no scleral icterus with positive conjunctival pallor.  EOMs full.  No nystagmus.  LUNGS:  Clear to percussion and auscultation.  There was no focal  dullness, no focal decreased breath sounds, and no consolidation.  CARDIAC:  Her precordium was 3+ hyperdynamic.  There was a grade 1-2/6  systolic murmur, heard best at the apex.  No gallops or rubs noted.  ABDOMEN:  Protuberant, but soft without any focal tenderness.  EXTREMITIES:  No clubbing, no cyanosis, and no edema.   PERTINENT LAB AND X-RAY DATA:  Included an EKG that revealed a sinus  tachycardia with changes consistent with left ventricle hypertrophy.  She had nonspecific repolarization abnormalities, but no acute ST-T wave  changes.  Her chest x-ray revealed mild cardiomegaly.  She had elevation  of the right hemidiaphragm and mild peribronchial thickening.  There was  no evidence of any focal parenchymal or airspace disease.  There was  mild thoracic spondylosis.  Her abdominal series revealed a moderate  amount of stool throughout the colon, but no free intraperitoneal air.  Her admission CBC, white count is 11,900 with a hemoglobin of 6.2 and a  hematocrit of 18.4.  Her admission INR was elevated at greater than  10.8.  Her protime was greater than 90 and PTT was 661.  Comprehensive  metabolic panel was normal with the exception of a glucose elevated at  249, her BUN was 77, estimated GFR was 59, CPK 85 with a CPK-MB of 2.3,  and troponin level of  0.04, both of which were normal.  On the second  hospital day, a repeat of her enzymes revealed a CK of 119 with a CK-MB  elevated at 12.2, a relative index of 10.3, and a troponin of 0.71,  markedly elevated over her admission numbers.   HOSPITAL COURSE:  The patient was admitted with a working diagnosis of  marked blood loss anemia and hyperanticoagulopathy secondary to Coumadin  therapy.  The patient was without chest pain; however, as noted above,  she did have cardiac enzyme changes consistent with an acute myocardial  infarction.  She was aggressively transfused to maintain a hematocrit of  25 and above.  She was given multiple injections of IV vitamin K as well  as at least a minimum 5 units of fresh frozen plasma in an attempt to  correct her anticoagulated state.  However, despite very prompt  transfusions and injections of vitamin K, her INR reached at level 3 and  persisted and did not appear to be responding to vitamin K therapy or  fresh frozen plasma.  Fortunately, she spontaneously stopped bleeding  despite the persistent anticoagulated state and her hemoglobin and  hematocrit were maintained on average of 23 and 8 after 6 units of red  blood cells were transfused.   The patient was seen in consultation by [facility], and the patient have  been evaluated previously with known diverticulosis.  A subsequent upper  endoscopy did not reveal any obvious bleeding site in the upper GI  tract.  During her hospitalization, it was noted that the patient's  platelet count did fall to an unexpectedly low range of about 50,000.  She was also seen by Dr. Cyndie Chime, who suggested that we continue to  attempt to reverse the Coumadin effect and that the patient be  considered for Lovenox therapy or a vena cava filter to treat her recent  pulmonary embolus.  Fortunately, the patient, despite her very slow  normalization of her INR, did stabilize and with transfusions.  Her  hematocrit was  maintained at the level of 31 after her 8 units of blood  was transfused with the increased volume with her myocardial infarction.  The patient did a bout of congestive heart failure, which was managed by  Dr. Algie Coffer using vigorous diuresis along with pre and afterload  reduction.  The patient's family was educated and taught to give her  subcu Lovenox, and it was decided not to move her out to insertion of a  vena cava filter.  With stabilization of her GI bleeding along with a  healing subendocardial myocardial infarction, the patient's strength  returned, and she was started on a program of rehabilitation directed by  the  physical therapy division here in the hospital.  Without any further  acute problems being identified, she was subsequently discharged home on  Lovenox therapy at a dose of 100 mg subcu once a day.  She was scheduled  to be seen back in Dr. Patsy Lager office in 1 week, scheduled to be  seen in my office in 2 weeks, and Dr. Algie Coffer in 3 weeks.   DISCHARGE CONDITION:  Significantly improved.  Her prognosis is  considered to be fair.  She is discharged home on a 3-g sodium, 15,000-  calorie, carb-modified, and fat-restricted diet.      Margaretmary Bayley, M.D.  Electronically Signed     PC/MEDQ  D:  08/18/2007  T:  08/18/2007  Job:  [id number]

## 2010-10-03 NOTE — Consult Note (Signed)
[name], SABEL NO.:  [id number]   MEDICAL RECORD NO.:  [id number]          PATIENT TYPE:  INP   LOCATION:  0380                         FACILITY[facility]   PHYSICIAN:  Graylin Shiver, M.D.   DATE OF BIRTH:  1926/05/18   DATE OF CONSULTATION:  08/14/2004  DATE OF DISCHARGE:                                   CONSULTATION   REASON FOR CONSULTATION:  This patient is a 77 year old African-American  female with a history of diverticulosis and prior history of diverticular  bleeding in the past.  She was admitted to the hospital yesterday with  rectal bleeding.  The bleeding was red, maroonish, and had clots in it.  The  patient states that 6 days ago she had an episode of bleeding which was  similar.  It stopped spontaneously, but then yesterday she had another  episode of bleeding and came for admission.  She has not had any further  bleeding today.   The patient underwent a colonoscopy in 1993 with findings of internal  hemorrhoids and diverticulosis.  She had another colonoscopy in 2001 with  findings of internal and external hemorrhoids and significant left-sided  diverticula again, with fresh blood in the descending colon.  The patient  has no complaints of hematemesis.  She has been on Plavix.   MEDICAL PROBLEMS:  1.  History of a CVA.  2.  Insulin-dependent diabetes mellitus.  3.  History of myocardial infarction.   ALLERGIES:  None known.   MEDICATIONS:  1.  Diovan.  2.  Plavix.  3.  Insulin.   SOCIAL HISTORY:  She does not smoke or drink alcohol.   SYSTEMS REVIEW:  She is not complaining of any anginal chest pains,  shortness of breath, cough, sputum production.   Hemoglobin and hematocrit on admission were 8 and 24.  Today, they are 8.9  and 26.8.   PHYSICAL EXAMINATION:  VITAL SIGNS:  Stable.  GENERAL:  She does not appear in any acute distress.  She is nonicteric.  HEART:  Regular rhythm.  No murmurs are heard.  LUNGS:  Clear.  ABDOMEN:  Soft, nontender.  No hepatosplenomegaly.   IMPRESSION:  Lower gastrointestinal bleeding which is most probably of  diverticular origin.   PLAN:  The patient appears stable at this time.  I would recommend continued  medical treatment.  I would not proceed with another colonoscopy at this  time, unless marked rectal bleeding occurred and therapeutic intervention  was felt necessary.      SFG/MEDQ  D:  08/14/2004  T:  08/14/2004  Job:  [id number]   cc:   Margaretmary Bayley, M.D.  [address], [address]  [place]  [place] [address]  Fax: [phone]   Llana Aliment. Malon Kindle., M.D.  [address]  [place]  [place] [address]  Fax: [phone]

## 2010-10-03 NOTE — Consult Note (Signed)
[facility]  Patient:    [name], [name]                      MRN: [id number] Adm. Date:  [id number] Attending:  Virgia Land CC:         Lindell Spar. Chestine Spore, M.D.   Consultation Report  HISTORY OF PRESENT ILLNESS:  Patient with a black bowel movement at home and persistent guaiac-positive here.  She was admitted for atypical chest pain. She has a known hiatal hernia, and an upper GI confirmed that but had no other sources of bleeding.  Currently she denies any GI complaints.  Since her mild CVA she has had some minimal swallowing problems but denies any abdominal pain, significant constipation other than occasionally taking prune juice, and has not seen any blood since her last bleed in 1995.  She had two episodes of diverticular bleeding, based on our old office chart, in 1995 and 1993, with two colonoscopies and previous endoscopies at that juncture, only revealing a hiatal hernia and diverticula.  We are consulted for further workup and plans.  PAST MEDICAL HISTORY: 1. Coronary artery disease. 2. High blood pressure. 3. Diabetes. 4. Some CVAs.  PAST SURGICAL HISTORY:  She says her only surgery has been a bladder tack.  CURRENT MEDICATIONS:  Protonix, Lasix, Procardia, [name], vitamins, digoxin, clonidine, nitroglycerin, Toprol, Lotensin, and insulin.  ALLERGIES:  No known allergies.  FAMILY HISTORY:  Pertinent for a daughter with ulcers, but no colon cancer or colon polyps.  SOCIAL HISTORY:  Other than her one aspirin a day she was on at home, denies any other aspirin or nonsteroidals.  Does take Tylenol.  Does not smoke or drink.  REVIEW OF SYSTEMS:  Pertinent for her not seeing any bleed here in the hospital.  She has not lost any weight.  PHYSICAL EXAMINATION:  No acute distress.  Lying comfortably in the chair. Not able to examine her since she was in radiology.  Will proceed prior to the procedure.  LABORATORY DATA:  Pertinent  for normal BUN and creatinine.  Stable hemoglobin at 9.7 with an MCV of 82, white count 9.5, platelet count 256.  Her initial hemoglobin on April 24, 2000, was 7, requiring two units of blood. Coagulations were normal on admission.  Albumin 3.2.  Normal liver tests on admission.  Again, BUN normal on admission.  CPK, MBs very minimally positive, as was her troponin.  GI negative except for hiatal hernia.  ASSESSMENT:  GI bleeding in a patient on one aspirin a day and history of diverticular bleeding in the past.  PLAN:  The risks and benefits of colonoscopy and, if negative and will tolerate, endoscopy, were discussed.  Will proceed at 3:30 tomorrow as per Dr. Chestine Spore.  Further workup and plans pending those findings.DD:  04/27/00 TD:  04/27/00 Job: [id number]

## 2010-10-03 NOTE — Op Note (Signed)
[place]  Patient:    [name], [name]                     MRN: [id number] Proc. Date: 12/07/00 Attending:  Guadelupe Sabin, M.D.                           Operative Report  Same job number as H&P.  PREOPERATIVE DIAGNOSES: 1. Retained cataract fragments, right eye, following cataract surgery and    posterior capsulotomy surgery. 2. Diabetes mellitus, insulin dependent type.  OPERATION:  Posterior vitrectomy through pars plana using vitreous infusion suction cutter with membrane peeling and excision.  SURGEON:  Guadelupe Sabin, M.D.  ASSISTANT:  Nurse.  ANESTHESIA:  Local, 4% Xylocaine, 0.7 Marcaine, retrobulbar block, approximately 4 cc in the retrobulbar space, topical Tetracaine.  Anesthesia standby required in this diabetic patient.  The patient was given various medications during the procedure for pain and comfort.  DESCRIPTION OF PROCEDURE:  After the patient was prepped and draped, a lid speculum was inserted in the right eye.  A peritomy was performed adjacent to the limbus from the 4 to 8 oclock position superiorly.  Three sclerotomy sites were prepared 3 mm from the limbus, using the MVR blade.  The 6 mm vitreous infusion terminal was secured in place at the 8 oclock position with a 5-0 green Mersilene suture.  The tip of the infusion terminal could be seen projecting into the vitreous cavity.  The fiberoptic light pipe was inserted at the 2 oclock position and the hand piece of the vitreous infusion suction cutter at the 10 oclock position.  Slow vitreous infusion and suction cutting were begun from an anterior location, posterior to the implant toward the retina.  The posterior capsule was extremely hazy with a small aperture.  The capsular opening was then gradually enlarged to approximately 5 mm from the probable 2.5 mm to 3 mm to 5 or 6 mm.  Attention was then paid to the extremely hazy vitreous.  The vitreous appeared  to be extremely fibrinous and hazy and posterior visualization was extremely poor.  Continued vitreous and infusion suction cutting were begun from the anterior to posterior direction toward the retina.  As the vitreous partially cleared, there appeared to be extensive proliferation by fibrovascular membranes on the surface of the retina, covering the optic nerve and extending along the vascular arcades. This was felt to represent proliferative changes which had not previously been seen and related to her diabetes.  It was then elected to try to remove and peel and cut of these membranes.  The Barth Kirks pick was introduced and the membrane approached from a location where a small hole was noted adjacent to the optic nerve.  The tissue was gradually peeled from the retinal surface and came forward into the mid vitreous cavity.  The vitreous infusion suction cutter was then reintroduced and the membrane continued to be cut toward the equator or anterior to the equator which it seemed to be attached to.  The membrane was then severed, but there appeared to be several residual attachments inferiorly.  The vitreous instruments were then reintroduced after indirect ophthalmoscopy and attempt made to sever the band which was noted at the 6 oclock position from the more thickened inferior membrane.  This was felt to be successful, although there appeared to be residual thickened membrane and/or possible retinal tear inferiorly.  Due to some surface  bleeding, it was difficult to control the visibility.  The bottle infusion pressure was elevated to approximately 60 temporarily, but again the vitreous still remained turbid with blood.  It was then felt that further manipulation would be extremely difficult due to the poor visualization and it was elected to withdraw the vitreous instruments.  The sclerotomy sites were then closed with interrupted 7-0 Vicryl sutures.  The conjunctiva was pulled forward  and closed with a running 7-0 Vicryl suture.  Depogaramycin and Celestone were injected in the sub Tenons space inferiorly.  Maxitrol and Atropine ointment were instilled in the conjunctival cul-de-sac.  A light patch and protective shield were applied to the operated right eye.  Duration of procedure was 1 to 1-1/2 hours.  The patient tolerated the procedure well in general, left the operating room for the main recovery room, and subsequently home discharge. DD:  12/07/00 TD:  12/07/00 Job: [id number]

## 2010-10-03 NOTE — H&P (Signed)
[place]  Patient:    [name], [name]                     MRN: [id number] Adm. Date:  12/07/00 Attending:  Guadelupe Sabin, M.D.                         History and Physical  This was a planned outpatient surgical admission of this patient, birthdate January 11, 1926, admitted for vitreous surgery for diabetic retinopathy and retained cataract fragments of the right eye.  HISTORY OF PRESENT ILLNESS:  This patient has a history of insulin dependent diabetes mellitus with secondary complications of diabetic retinopathy and cataract formation.  The patient underwent cataract implant surgery by Dr. Jethro Bolus in February 2002.  The patient stated that her vision has remained poor following this surgery with variable vision floaters.  The patient was referred to my office on Oct 14, 2000 by Dr. Margaretmary Bayley, her endocrinologist, for further evaluation.  Initial examination revealed a visual acuity without correction of 20/200, right eye, 20/60, left eye.  With correction 20/400, right eye, 20/30, left eye.  Applanation tonometry 10 mm right eye, 14 left eye.  Detailed fundus examination with indirect ophthalmoscopy and dilation revealed in the right eye a very hazy posterior capsule.  Details of the fundus examination were extremely difficult to see, but it was felt that the vitreous was relatively clear behind the hazy posterior capsule and that the retina was attached with only limited viewing of the optic nerve.  The left eye revealed a clear vitreous attached retina. It was felt that the patient had an after cataract or hazy capsule in the right eye, pseudophakia, right eye, nuclear cataract, left eye, and insulin dependent diabetes mellitus with limited or no diabetic retinopathy.  The patient was sent back to Dr. Nile Riggs who did a YAG laser capsulotomy on the patient.  The patient was referred back to the office by Dr. Nile Riggs and  was seen again on November 26, 2000, at which time her vision remained at less than 20/400, right eye.  A small posterior capsule opening was seen, but there was hazy debris and cataract capsule remnants in the hazy vitreous behind the implant.  Once again detailed fundus examination was impossible due to the hazy vitreous.  It was felt that since YAG laser capsulotomy had been unsuccessful in clearing the posterior capsule and small opening achieved, that posterior vitrectomy would be necessary to clear the inflamed vitreous and cataract remnant debris.  The patient signed an informed consent and arrangements were made for outpatient admission at this time.  PAST MEDICAL HISTORY:  The patient continues under the care of Dr. Margaretmary Bayley for her diabetes.  She is taking multiple medications under his direction, including Catapres, Lipitor, furosemide, _________ , Plavix, insulin.  REVIEW OF SYSTEMS:  No cardiorespiratory complaints.  PHYSICAL EXAMINATION:  GENERAL:  The patient is a pleasant 77 year old black female complaining of decreased vision in the right eye.  HEENT:  Ocular exam as noted above.  CHEST:  Lungs clear to percussion and auscultation.  HEART:  Normal sinus rhythm.  No cardiomegaly, no murmurs.  ABDOMEN:  Negative.  EXTREMITIES:  Negative.  ADMISSION DIAGNOSES: 1. Retained cataract fragments, right eye. 2. Pseudophakia, right eye. 3. Posterior capsulotomy, right eye. 4. Diabetes mellitus, insulin dependent type.  SURGICAL PLAN:  Posterior vitrectomy with removal of retained cataract capsule fragments, and clearing of  the vitreous for further detailed fundus evaluation. DD:  12/07/00 TD:  12/07/00 Job: [id number]

## 2010-10-03 NOTE — Discharge Summary (Signed)
[name], [name] NO.:  [id number]   MEDICAL RECORD NO.:  [id number]          PATIENT TYPE:  INP   LOCATION:  1437                         FACILITY:  [facility]   PHYSICIAN:  Margaretmary Bayley, M.D.    DATE OF BIRTH:  25-Dec-1925   DATE OF ADMISSION:  06/09/2007  DATE OF DISCHARGE:  06/20/2007                               DISCHARGE SUMMARY   DISCHARGE DIAGNOSES:  1. Acute pulmonary embolization.  2. Systemic anticoagulation.  3. Insulin-requiring type 2 diabetes mellitus.  4. Systemic hypertension.  5. Arteriosclerotic cerebrovascular disease and previous CVA.   REASON FOR ADMISSION:  This is one of several [place]  hospitalizations for [name], a 77 year old [place] American woman  who presented to the emergency room with chief complaint of shortness of  breath which actually started several days prior to admission.  The  patient states it was of gradual onset, but quite progressive over the  last day, and on the day of admission.  The patient states that she had  noted in addition some increasing swelling of her lower extremities, but  denies any significant fever, chills.  There was some anterior chest  discomfort, but she stated that this was minor problems for her compared  with a shortness of breath.  She denied any cough, no hemoptysis.   PERTINENT PHYSICAL FINDINGS:  GENERAL:  She is a well-developed, well-  nourished woman, appearing younger than stated age of 77.  HEENT:  Unremarkable.  She had no scleral icterus.  No conjunctival  pallor.  No head trauma.  NECK: Supple.  No JVD.  No Thyroid enlargement or tenderness.  CHEST:  No splinting, tenderness.  LUNGS:  Clear.  No focal dullness or decreasing breath sounds.  CARDIAC:  Precordium was 2+ hyperdynamic.  There was a grade 2/6  systolic murmur heard best at the apex.  No gallops or rubs noted.  ABDOMEN:  Soft and nontender.  EXTREMITIES:  No clubbing, cyanosis or edema.  NEUROLOGIC:  The  patient WAS alert, oriented and there were no focal  sensory, motor or reflex deficit.   STUDIES:  EKG revealed a sinus tachycardia, left axis deviation.  She  had QS complexes in V1-V2 consistent with a possible old anteroseptal  MI.  No acute ST-T wave changes noted.   PERTINENT LAB AND X-RAY DATA:  Her sodium 137, potassium 3.8, chloride  105, CO2 23, glucose 271, BUN of 32, creatinine 1.56, calcium 9.5, PTT  28, INR 1, white count of 11,100, hematocrit of 35, hemoglobin of 11.4.  Chest x-ray revealed cardiomegaly with an increase in bronchovascular  markings.  No interstitial edema, and no other acute process.   PERTINENT HOSPITAL CHANGES:  The patient, because of her shortness of  breath, had a spiral CT scan of the chest which was impressively  positive for bilateral pulmonary embolization.  She was started on a  continuous infusion of heparin based on pharmacy protocol the patient's  shortness of breath was felt to be related to her pulmonary embolization  and not pulmonary vascular congestion. She was given  IV Lasix in the  ER, however and did achieve a very prompt diuresis.  She was also  started on Xopenex for bronchodilatation and an attempt to obtain a  source for embolus and pulmonary emboli.  She underwent a 2-D  echocardiogram which revealed a decrease in her ejection fraction to 40%  for several areas of ventricular wall hypokinesis.  She had marked  mitral valve calcification with a decrease in mitral excursions.  The  patient was seen in consultation by Dr. Algie Coffer who felt that a  transesophageal echo was indicated for further delineation of her  pathology.   The patient was continued on heparin and started on Coumadin on hospital  day #3.  Her diabetes was controlled in the usual fashion using a  combination of a basal insulin in the form of Lantus and a fractional  schedule of NovoLog to cover her preprandial blood sugars.  She was  noted to have some  hypokalemia and this was treated with parenteral  n.p.o. potassium chloride.  Because of her mitral valve disease, blood  cultures were obtained and subsequently returned negative.  The patient  had a three day overlap period between her heparin and Coumadin and once  her INR was therapeutic, the heparin was discontinued.  The patient did  have a slight diminution in her hematocrit/hemoglobin during her  hospitalization with the hematocrit dropping to 27.  No documentation of  any GI bleeding was noted and she was continued on systemic  anticoagulation.  Because of a low iron saturation of 7%, the patient  was started on ferrous gluconate supplements with the intentions to  continue to monitor her for occult GI bleeding on discharge.  No other  significant complications or problems occurred during her  hospitalization, and she was subsequently discharged on a condition that  was significantly improved.  The patient was subsequently discharged  home on warfarin for Coumadin 3 mg once a day.  She is to stop her  Caduet replaced by Lipitor 20 mg once a day. Other medications include  Diovan 160/12.5 once a day, ferrous gluconate 325 mg twice a day with  meals, 50 units of Novolin 70/30 every night, and she is to use a  sliding scale regimen of 70/30 each morning based on her fasting blood  sugars.  She is to continue Metformin at 500 mg twice a day along with  her calcium and vitamin D.  The patient was scheduled to be seen back in  my office in 3 days for a follow-up protime, and she is scheduled to be  seen by Dr. Algie Coffer in 10-14 days.  She is discharged home on 3 gram  sodium, 1500 calories carb-modified, fat-restricted diet.   DISCHARGE CONDITION:  Significantly improved.      Margaretmary Bayley, M.D.  Electronically Signed     PC/MEDQ  D:  07/21/2007  T:  07/21/2007  Job:  [id number]

## 2010-10-03 NOTE — Discharge Summary (Signed)
[facility]  Patient:    [name], [name]                      MRN: [id number] Adm. Date:  [id number] Disc. Date: [id number] Attending:  Virgia Land                           Discharge Summary  DISCHARGE DIAGNOSES:  1. Chronic diverticulosis with gastrointestinal bleeding.  2. Arteriosclerotic cerebrovascular disease and Sara previous history of Sara     cerebrovascular accident.  3. Insulin requiring diabetes mellitus.  4. Dilated cardiomyopathy with Sara previous history of Sara non-Q wave myocardial     infarction.  5. Hypercholesterolemia.  6. Systemic hypertension.  REASON FOR ADMISSION:  This is one of several [place] hospitalizations for [name], Sara 77 year old African-American Mata, who is brought into the hospital with Sara chief complaint of injury to chest and epigastric pain of less than 24 hours duration. The patient states that subsequent to the development of her pain, she noted the passing of several black tarry stools but noted at no time any orthostatic faintness, syncope, or loss of consciousness of any type. She states that she did feel weak and was somewhat cool and clammy but had not nausea and vomiting. She was brought into the emergency room where she was noted to have stools which were dark brown in color and positive for blood.  PERTINENT PHYSICAL FINDINGS:  GENERAL:  [name] well-developed, well-nourished Mata appearing quite anxious but without any acute respiratory distress.  VITAL SIGNS:  Her temperature was 98.2 with Sara pulse rate of 120 and blood pressure of 180/90, respiratory rate of 24.  HEENT:  She had no scleral icterus and conjunctival pallor. Funduscopic exam revealed changes of background or diabetic retinopathy.  NECK:  Supply, no adenopathy or thyromegaly.  CHEST:  There was no splinting or tenderness.  LUNGS:  Clear to auscultation and percussion.  CARDIAC:  [name] dynamic without any  visible pulsations. [name] grade 2/6 systolic murmur at the left sternal border and no gallops or rubs.  ABDOMEN:  There was mild to moderate epigastric as well as lower abdominal tenderness without any rebound, no obvious masses or organomegaly appreciated.  RECTAL:  She had good sphincter tone, stool was dark brown and grossly positive for blood.  NEUROLOGIC:  She was alert, oriented and cooperative There was no gross focal sensory, motor or reflex deficit.  LAB AND X-RAY DATA:  Arterial blood gas on room air, her pH was 7.47 with Sara pCO2 of 36, PO2 was 45. CBC, hematocrit is 21 with Sara white blood count of 8800. Her stools are positive for blood on four checks. Her protime and PTT were unremarkable. Her complete metabolic panel was normal with the exception of an elevation and Sara glucose to 213, albumin is slightly low at 3.2. Her CK-MB is normal at 1.9, relative index of 1.1, troponin level of 0.04. Sara subsequent CK-MB is elevated at 5.5 with Sara relative index of 3.4 and troponin level of 0.70. Her lipid panel was normal with Sara cholesterol of 134, triglycerides of 130 and her total cholesterol to HDL ratio was 3.8. TSH is 4.13, CEA of 1.2. He Helicobacter pylori antibody is grossly positive at 3.42.  Her admission EKG revealed Sara sinus tachycardia with occasional supraventricular complexes. She had an incomplete left bundle branch block, nonspecific repolarization abnormalities but  no acute changes. Sara follow-up EKG revealed similar findings except Sara slightly prolonged Q/T. [name] normal sinus rhythm with Sara rate now at 96.  Her admission chest x-ray revealed cardiomegaly with mild pulmonary vascular congestion. Sara follow-up chest x-ray revealed similarly borderline increase in heart size with improvement in resolution of her vascular congestion. Sara gastric emptying study revealed Sara delay at 60 minutes 54% of the original activity it was still remaining in the stomach. At 120 minutes  34% of the activity was still remaining.  HOSPITAL COURSE:  The patient was admitted to Sara telemetry bed where cardiac enzymes as noted above were obtained and she did show some slight elevations in her CPK-MB and troponin levels after six hours. There were no significant changes on her EKG. However, Dr. Algie Coffer felt that the patient probably did have Sara mild non Q-wave MI possibly related to the sudden drop in her hematocrit down to 21. She was transfused two units of red blood cells with the hematocrit increasing to 28.5. She had no further episodes of chest pain. She subsequently underwent colonoscopy and upper endoscopy by Dr. Ewing Schlein of the GI service. She did have evidence of diverticula of the colon in the left colon but there was no evidence of any active or fresh bleeding noted. There was no evidence of Sara malignancy. In addition, she did have evidence of internal and external hemorrhoids.  The remainder of her hospital course was uneventful. Ace inhibitor was added to her antihypertensive regimen and she was continued on [name], [name], Imdur, Toprol and Lasix. Her diabetic management in the hospital was quite good with no significant elevation. She did have slight reduction in potassium and this was treated with Kay-Ciel supplements. [name] [name], [name], [name], Imdur, Toprol and Lasix. She is scheduled to be seen by Dr. Algie Coffer in 2-4 weeks. She is to be seen back in my office in 1 week when we will recheck her stools for blood and repeat her CBC. She is discharged home on Sara no salt added 1500 calorie ADA modified fat restricted diet. DD:  05/26/00 TD:  05/27/00 Job: [id number]

## 2010-10-03 NOTE — Cardiovascular Report (Signed)
[place]. [facility]  Patient:    [name], [name] Visit Number: [id number] MRN: [id number]          Service Type: CAT Location: [facility] 2853 [id number] Attending Physician:  Ricki Rodriguez Dictated by:   Ricki Rodriguez, M.D. Proc. Date: 10/26/01 Admit Date:  10/26/2001 Discharge Date: 10/26/2001                          Cardiac Catheterization  HOSPITAL LOCATION: Outpatient.  PROCEDURES: Left heart catheterization, selective coronary angiography and left ventricular function study, and left internal mammary artery injection.  INDICATIONS: This 77 year old black female had angina with risk factors of diabetes, hypertension, and had abnormal stress test.  APPROACH: Right femoral artery using 6 French diagnostic catheters.  Duett AngioSeal device was used successfully postprocedure.  COMPLICATIONS: None.  HEMODYNAMIC DATA: The left ventricular pressure was 165/27 and aortic pressure was 165/76.  LEFT VENTRICULOGRAM: The left ventriculogram showed inferior and apical akinesia and anterior hypokinesis with ejection fraction of 25-30% plus moderate MR.  CORONARY ANATOMY: Left main: The left main coronary artery was unremarkable.  Left left anterior descending: Left anterior descending coronary artery had a mild proximal narrowing and additional 30-40% eccentric stenosis.  Left circumflex: The left circumflex coronary artery had a mild proximal disease, a small ramus and obtuse marginal branch #1 artery. The obtuse marginal branch #2 had a total occlusion at its origin and had a faint filling of the rest of the vessel, which appeared diffusely narrowed.  Right coronary artery: The right coronary artery was smaller vessel with a total occlusion at the mid vessel area with a long segment missing in between. The distal right coronary artery filled by collaterals from LAD and septal perforators, and was about 1.5 mm in diameter.  IMPRESSION: 1. Multivessel  coronary artery disease. 2. Marked left ventricular systolic dysfunction with moderate mitral    regurgitation. 3. Patent left internal mammary artery.  RECOMMENDATIONS: This patient is high risk for PTCA and may benefit partially with a coronary artery bypass graft surgery. Will discuss medical options and interventions with the patient and the family. Dictated by:   Ricki Rodriguez, M.D. Attending Physician:  Ricki Rodriguez DD:  10/26/01 TD:  10/28/01 Job: [id number]

## 2010-10-03 NOTE — Procedures (Signed)
[facility]  Patient:    [name], [name]                      MRN: [id number] Proc. Date: 04/28/00 Adm. Date:  [id number] Attending:  Virgia Land CC:         Lindell Spar. Chestine Spore, M.D.   Procedure Report  PROCEDURE:  Colonoscopy.  INDICATIONS FOR PROCEDURE:  A patient with guaiac positivity, history of black stools on 1 aspirin a day, nondiagnostic upper GI.  Consent was signed after risks, benefits, methods, and options were thoroughly discussed yesterday.  MEDICINES USED:  Demerol 50, versed 5.  DESCRIPTION OF PROCEDURE:  Rectal inspection was pertinent for small external hemorrhoids. Digital exam was negative. The pediatric video colonoscope was inserted and with moderate difficulty due to a left side that filled with diverticula and some looping, we were able to advance to the cecum. This did require some abdominal pressure and rolling her on her back. The cecum was identified by the appendiceal orifice and the ileocecal valve. There was some barium throughout the colon mostly on the right side. On insertion in the left side around a few diverticula was some more fresh blood, but it was not seen in the transverse or the right side. It was not seen in the sigmoid or rectum. It was more in the descending but no active bleeding was seen. On slow withdrawal through the colon lots of washing and suctioning was done. We could not get all the barium from the cecum but adequate visualization was had at most areas although some barium was adhered to the wall. No polypoid lesions, masses or other abnormalities were seen. With washing and suctioning, we did not see any blood on slow withdrawal, and again no other abnormalities but left sided diverticula were seen. Once back in the rectum, the scope was retroflexed revealing some tiny internal hemorrhoids. The scope was reinserted a short ways up the left side of the colon, air was suctioned, scope  removed. The patient tolerated the procedure adequately. There was no obvious or immediate complication.  ENDOSCOPIC DIAGNOSIS: 1. Internal/external hemorrhoids. 2. Significant left sided diverticula with a little fresh blood in the    descending without any active bleeding seen after washing and suctioning. 3. Otherwise within normal limits to the cecum except for increased barium,    right greater than left.  PLAN:  Care with aspirin, possibly consider using Plavix which might be safer for her colon or stomach. Hold endoscopy for now secondary to the fairly fresh blood being seen on the left side without any on the right side. Happy to see back p.r.n. Consider endoscopy if signs of active bleeding or continued guaiac positive. Would see back in 6 weeks to recheck guaiacs, CBC and make sure no further workup plans are needed and could leave that to Dr. Chestine Spore. DD:  04/28/00 TD:  04/29/00 Job: [id number]

## 2010-12-01 ENCOUNTER — Inpatient Hospital Stay (HOSPITAL_COMMUNITY): Payer: Medicare Other

## 2010-12-01 ENCOUNTER — Inpatient Hospital Stay (HOSPITAL_COMMUNITY)
Admission: AD | Admit: 2010-12-01 | Discharge: 2010-12-04 | DRG: 292 | Disposition: A | Discharge status: Home Health | Payer: Medicare Other | Source: Ambulatory Visit | Attending: Cardiovascular Disease | Admitting: Cardiovascular Disease

## 2010-12-01 DIAGNOSIS — I501 Left ventricular failure: Secondary | ICD-10-CM | POA: Diagnosis present

## 2010-12-01 DIAGNOSIS — I129 Hypertensive chronic kidney disease with stage 1 through stage 4 chronic kidney disease, or unspecified chronic kidney disease: Secondary | ICD-10-CM | POA: Diagnosis present

## 2010-12-01 DIAGNOSIS — D509 Iron deficiency anemia, unspecified: Secondary | ICD-10-CM | POA: Diagnosis present

## 2010-12-01 DIAGNOSIS — Z79899 Other long term (current) drug therapy: Secondary | ICD-10-CM

## 2010-12-01 DIAGNOSIS — K5909 Other constipation: Secondary | ICD-10-CM | POA: Diagnosis present

## 2010-12-01 DIAGNOSIS — I2510 Atherosclerotic heart disease of native coronary artery without angina pectoris: Secondary | ICD-10-CM | POA: Diagnosis present

## 2010-12-01 DIAGNOSIS — R29898 Other symptoms and signs involving the musculoskeletal system: Secondary | ICD-10-CM | POA: Diagnosis present

## 2010-12-01 DIAGNOSIS — I5022 Chronic systolic (congestive) heart failure: Principal | ICD-10-CM | POA: Diagnosis present

## 2010-12-01 DIAGNOSIS — M129 Arthropathy, unspecified: Secondary | ICD-10-CM | POA: Diagnosis present

## 2010-12-01 DIAGNOSIS — I69998 Other sequelae following unspecified cerebrovascular disease: Secondary | ICD-10-CM

## 2010-12-01 DIAGNOSIS — N390 Urinary tract infection, site not specified: Secondary | ICD-10-CM | POA: Diagnosis not present

## 2010-12-01 DIAGNOSIS — Z7982 Long term (current) use of aspirin: Secondary | ICD-10-CM

## 2010-12-01 DIAGNOSIS — Z794 Long term (current) use of insulin: Secondary | ICD-10-CM

## 2010-12-01 DIAGNOSIS — N182 Chronic kidney disease, stage 2 (mild): Secondary | ICD-10-CM | POA: Diagnosis present

## 2010-12-01 DIAGNOSIS — E119 Type 2 diabetes mellitus without complications: Secondary | ICD-10-CM | POA: Diagnosis present

## 2010-12-01 LAB — COMPREHENSIVE METABOLIC PANEL
Alkaline Phosphatase: 97 U/L (ref 39–117)
BUN: 43 mg/dL — ABNORMAL HIGH (ref 6–23)
CO2: 26 mEq/L (ref 19–32)
Chloride: 97 mEq/L (ref 96–112)
Creatinine, Ser: 1.68 mg/dL — ABNORMAL HIGH (ref 0.50–1.10)
GFR calc non Af Amer: 29 mL/min — ABNORMAL LOW (ref >60)
Glucose, Bld: 127 mg/dL — ABNORMAL HIGH (ref 70–99)
Potassium: 4.6 mEq/L (ref 3.5–5.1)
Total Bilirubin: 0.6 mg/dL (ref 0.3–1.2)

## 2010-12-01 LAB — CBC
HCT: 33 % — ABNORMAL LOW (ref 36.0–46.0)
Hemoglobin: 10.6 g/dL — ABNORMAL LOW (ref 12.0–15.0)
MCHC: 32.1 g/dL (ref 30.0–36.0)
MCV: 84.8 fL (ref 78.0–100.0)
RDW: 16 % — ABNORMAL HIGH (ref 11.5–15.5)
WBC: 7.3 10*3/uL (ref 4.0–10.5)

## 2010-12-01 LAB — CARDIAC PANEL(CRET KIN+CKTOT+MB+TROPI)
CK, MB: 3.8 ng/mL (ref 0.3–4.0)
Relative Index: 3.5 — ABNORMAL HIGH (ref 0.0–2.5)
Total CK: 127 U/L (ref 7–177)
Troponin I: <0.3 ng/mL (ref <0.30)

## 2010-12-01 LAB — DIFFERENTIAL
Basophils Absolute: 0.1 10*3/uL (ref 0.0–0.1)
Eosinophils Relative: 2 % (ref 0–5)
Lymphocytes Relative: 14 % (ref 12–46)
Lymphs Abs: 1 10*3/uL (ref 0.7–4.0)
Monocytes Absolute: 0.6 10*3/uL (ref 0.1–1.0)
Neutro Abs: 5.5 10*3/uL (ref 1.7–7.7)

## 2010-12-01 LAB — GLUCOSE, CAPILLARY: Glucose-Capillary: 172 mg/dL — ABNORMAL HIGH (ref 70–99)

## 2010-12-01 LAB — MAGNESIUM: Magnesium: 2.2 mg/dL (ref 1.5–2.5)

## 2010-12-02 LAB — BASIC METABOLIC PANEL
BUN: 44 mg/dL — ABNORMAL HIGH (ref 6–23)
Chloride: 98 mEq/L (ref 96–112)
Creatinine, Ser: 1.5 mg/dL — ABNORMAL HIGH (ref 0.50–1.10)
GFR calc Af Amer: 40 mL/min — ABNORMAL LOW (ref >60)
Glucose, Bld: 233 mg/dL — ABNORMAL HIGH (ref 70–99)
Potassium: 4.5 mEq/L (ref 3.5–5.1)

## 2010-12-02 LAB — GLUCOSE, CAPILLARY
Glucose-Capillary: 356 mg/dL — ABNORMAL HIGH (ref 70–99)
Glucose-Capillary: 443 mg/dL — ABNORMAL HIGH (ref 70–99)
Glucose-Capillary: 427 mg/dL — ABNORMAL HIGH (ref 70–99)

## 2010-12-02 LAB — CBC
MCHC: 32 g/dL (ref 30.0–36.0)
MCV: 84.3 fL (ref 78.0–100.0)
Platelets: 192 10*3/uL (ref 150–400)
RDW: 16 % — ABNORMAL HIGH (ref 11.5–15.5)
WBC: 7.3 10*3/uL (ref 4.0–10.5)

## 2010-12-02 LAB — CARDIAC PANEL(CRET KIN+CKTOT+MB+TROPI): Troponin I: <0.3 ng/mL (ref <0.30)

## 2010-12-03 LAB — CBC
HCT: 29.3 % — ABNORMAL LOW (ref 36.0–46.0)
MCHC: 32.4 g/dL (ref 30.0–36.0)
Platelets: 186 10*3/uL (ref 150–400)
RDW: 16.1 % — ABNORMAL HIGH (ref 11.5–15.5)
WBC: 6.5 10*3/uL (ref 4.0–10.5)

## 2010-12-03 LAB — GLUCOSE, CAPILLARY
Glucose-Capillary: 237 mg/dL — ABNORMAL HIGH (ref 70–99)
Glucose-Capillary: 335 mg/dL — ABNORMAL HIGH (ref 70–99)
Glucose-Capillary: 377 mg/dL — ABNORMAL HIGH (ref 70–99)
Glucose-Capillary: 326 mg/dL — ABNORMAL HIGH (ref 70–99)
Glucose-Capillary: 390 mg/dL — ABNORMAL HIGH (ref 70–99)
Glucose-Capillary: 232 mg/dL — ABNORMAL HIGH (ref 70–99)

## 2010-12-03 LAB — HEMOGLOBIN A1C
Hgb A1c MFr Bld: 8.4 % — ABNORMAL HIGH (ref <5.7)
Mean Plasma Glucose: 194 mg/dL — ABNORMAL HIGH (ref <117)

## 2010-12-03 LAB — URINALYSIS, MICROSCOPIC ONLY
Ketones, ur: NEGATIVE mg/dL
Nitrite: NEGATIVE
Specific Gravity, Urine: 1.019 (ref 1.005–1.030)
pH: 5 (ref 5.0–8.0)

## 2010-12-03 LAB — BASIC METABOLIC PANEL
BUN: 43 mg/dL — ABNORMAL HIGH (ref 6–23)
Chloride: 99 mEq/L (ref 96–112)
GFR calc Af Amer: 39 mL/min — ABNORMAL LOW (ref >60)
GFR calc non Af Amer: 32 mL/min — ABNORMAL LOW (ref >60)
Potassium: 4.2 mEq/L (ref 3.5–5.1)
Sodium: 135 mEq/L (ref 135–145)

## 2010-12-04 LAB — GLUCOSE, CAPILLARY
Glucose-Capillary: 147 mg/dL — ABNORMAL HIGH (ref 70–99)
Glucose-Capillary: 182 mg/dL — ABNORMAL HIGH (ref 70–99)

## 2010-12-04 NOTE — Discharge Summary (Signed)
[name], RENZULLI NO.:  [id number]  MEDICAL RECORD NO.:  [id number]  LOCATION:  4735                         FACILITY:  [facility]  PHYSICIAN:  Ricki Rodriguez, M.D.  DATE OF BIRTH:  04/23/1926  DATE OF ADMISSION:  12/01/2010 DATE OF DISCHARGE:  12/04/2010                              DISCHARGE SUMMARY   FINAL DIAGNOSES: 1. Acute on chronic left heart systolic failure. 2. Urinary tract infection. 3. Diabetes mellitus type 2. 4. Chronic constipation. 5. Chronic iron-deficiency anemia. 6. Chronic arthritis of multiple joints.  DISCHARGE MEDICATIONS: 1. Cipro 250 mg 1 twice daily for 5 days. 2. Potassium chloride 10 mEq 1 daily. 3. Humulin 50/50 35 units in the evening and 70 units in the morning.     Family member to adjust dose as directed for the patient. 4. Aspirin 81 mg 1 at bedtime. 5. Allopurinol 100 mg 1 at bedtime. 6. Xanax 0.25 mg 1 at bedtime. 7. Celebrex 50 mg 1 daily as needed. 8. Calcium, magnesium, zinc over-the-counter 1 daily. 9. Cinnamon 1000 mg 1 daily. 10.Combivent inhaler 2 puffs twice daily. 11.Digoxin 0.125 mg 1 every other day. 12.Famotidine 20 mg 1 at bedtime. 13.Geritol 1 tablet daily. 14.I-caps eye vitamin 1 twice daily. 15.Imdur 30 mg half a tablet daily. 16.Lasix 40 mg twice daily. 17.Lipitor 20 mg at bedtime. 18.Mineral oil 2 tablespoons at bedtime. 19.Metoprolol tartrate 25 mg half a tablet twice daily. 20.Nasonex nasal spray 2 sprays nasally at bedtime. 21.Omega-3 1 g daily. 22.Vitamin B complex with C 1 daily.  DISCHARGE DIET:  Low-sodium, heart-healthy diet with 1500 mL fluid restriction per day and carbohydrate-modified, medium-calorie diet.  DISCHARGE ACTIVITY:  The patient to increase activity as tolerated and use walker as needed.  FOLLOWUP:  By Ricki Rodriguez, MD in 1 week.  CONDITION ON DISCHARGE:  Improved.  HISTORY:  This 77 year old black female had shortness of breath for 2 days.  The patient was  off Lasix for 2 days followed by increasing shortness of breath.  The patient denied chest pain, fever, or cough.  PHYSICAL EXAMINATION:  VITAL SIGNS:  Temperature 97, pulse 80, respirations 18, blood pressure 146/74. GENERAL:  The patient is averagely-built and nourished black female in mild respiratory distress. HEENT:  The patient is normocephalic, atraumatic with brown eyes. Conjunctivae pale pink.  Sclerae nonicteric.  Wears glasses. NECK:  No JVD.  No carotid bruits. LUNGS:  Revealed crackles bilaterally half way up the lungs. HEART:  Normal S1 and S2 with grade 3/6 systolic murmur. ABDOMEN:  Soft and nontender. EXTREMITIES:  No edema, cyanosis, or clubbing. SKIN:  Warm and dry. NEUROLOGIC:  The patient moves all 4 extremities and with some right- sided weakness.  LABORATORY DATA:  Normal WBC count, platelet count, hemoglobin low at 10.6, hematocrit 33.  Pro BNP elevated at 7313.  Electrolytes normal. BUN 43, creatinine 1.68, magnesium 2.2.  CK-MB, troponin I negative x3. Hemoglobin A1c of 8.4.  Urinalysis showed moderate leukocytosis with 21- 50 wbc's, rare bacteria.  Chest x-ray revealed a low lung volume with diffuse bilateral airspace disease and suspect bilateral pleural effusion.  EKG revealed sinus rhythm with possible left atrial enlargement and left bundle-branch block.  HOSPITAL COURSE:  The patient was admitted to telemetry unit. Myocardial infarction was ruled out.  She was given IV Lasix with good diuresis.  Her blood sugars control initially was not as good.  Her insulin dosage was adjusted.  She was started on Cipro 250 mg 1 twice daily for her urinary tract infection.  The patient and family desired early discharge with good support at home.  The patient was discharged home in satisfactory condition with followup by me in 1 week with Dr. Orpah Cobb.     Ricki Rodriguez, M.D.     ASK/MEDQ  D:  12/04/2010  T:  12/04/2010  Job:   [id number]  Electronically Signed by Orpah Cobb M.D. on 12/04/2010 08:30:40 PM

## 2011-02-05 LAB — HEPARIN LEVEL (UNFRACTIONATED)
Heparin Unfractionated: 0.33
Heparin Unfractionated: 0.45
Heparin Unfractionated: 0.56
Heparin Unfractionated: 0.57
Heparin Unfractionated: 0.99 — ABNORMAL HIGH
Heparin Unfractionated: <0.1 — ABNORMAL LOW

## 2011-02-05 LAB — CBC
HCT: 28.4 — ABNORMAL LOW
HCT: 28.9 — ABNORMAL LOW
HCT: 29.4 — ABNORMAL LOW
HCT: 29.7 — ABNORMAL LOW
HCT: 30.1 — ABNORMAL LOW
HCT: 30.7 — ABNORMAL LOW
HCT: 34.5 — ABNORMAL LOW
Hemoglobin: 10 — ABNORMAL LOW
Hemoglobin: 10.1 — ABNORMAL LOW
Hemoglobin: 10.2 — ABNORMAL LOW
Hemoglobin: 10.5 — ABNORMAL LOW
Hemoglobin: 9.2 — ABNORMAL LOW
Hemoglobin: 9.5 — ABNORMAL LOW
Hemoglobin: 9.6 — ABNORMAL LOW
Hemoglobin: 9.8 — ABNORMAL LOW
MCHC: 33.3
MCHC: 33.4
MCHC: 33.4
MCHC: 34
MCHC: 34.1
MCHC: 34.2
MCV: 79.5
MCV: 80.1
MCV: 80.3
MCV: 80.4
MCV: 80.8
MCV: 81.2
Platelets: 199
Platelets: 210
Platelets: 226
Platelets: 234
Platelets: 241
RBC: 3.37 — ABNORMAL LOW
RBC: 3.53 — ABNORMAL LOW
RBC: 3.58 — ABNORMAL LOW
RBC: 3.7 — ABNORMAL LOW
RBC: 3.71 — ABNORMAL LOW
RBC: 3.73 — ABNORMAL LOW
RBC: 3.87
RDW: 15.9 — ABNORMAL HIGH
RDW: 16 — ABNORMAL HIGH
RDW: 16.1 — ABNORMAL HIGH
RDW: 16.3 — ABNORMAL HIGH
RDW: 16.4 — ABNORMAL HIGH
RDW: 16.5 — ABNORMAL HIGH
WBC: 11.1 — ABNORMAL HIGH
WBC: 7.2
WBC: 7.6
WBC: 8.8

## 2011-02-05 LAB — URINALYSIS, ROUTINE W REFLEX MICROSCOPIC
Glucose, UA: NEGATIVE
Hgb urine dipstick: NEGATIVE
Protein, ur: NEGATIVE

## 2011-02-05 LAB — PROTIME-INR
INR: 1
INR: 1.4
INR: 1.9 — ABNORMAL HIGH
INR: 2.8 — ABNORMAL HIGH
Prothrombin Time: 13.6
Prothrombin Time: 14.5
Prothrombin Time: 14.5
Prothrombin Time: 17.2 — ABNORMAL HIGH
Prothrombin Time: 22.6 — ABNORMAL HIGH
Prothrombin Time: 30.2 — ABNORMAL HIGH

## 2011-02-05 LAB — CULTURE, BLOOD (ROUTINE X 2)
Culture: NO GROWTH
Culture: NO GROWTH
Culture: NO GROWTH

## 2011-02-05 LAB — B-NATRIURETIC PEPTIDE (CONVERTED LAB): Pro B Natriuretic peptide (BNP): 293 — ABNORMAL HIGH

## 2011-02-05 LAB — CARDIAC PANEL(CRET KIN+CKTOT+MB+TROPI)
CK, MB: 1.9
Relative Index: 1.7
Relative Index: 1.7
Total CK: 112

## 2011-02-05 LAB — BASIC METABOLIC PANEL
BUN: 27 — ABNORMAL HIGH
BUN: 32 — ABNORMAL HIGH
CO2: 22
CO2: 26
Calcium: 9.5
Chloride: 103
Chloride: 106
Creatinine, Ser: 1.56 — ABNORMAL HIGH
GFR calc Af Amer: 49 — ABNORMAL LOW
GFR calc non Af Amer: 32 — ABNORMAL LOW
GFR calc non Af Amer: 42 — ABNORMAL LOW
Glucose, Bld: 222 — ABNORMAL HIGH
Glucose, Bld: 271 — ABNORMAL HIGH
Potassium: 3.4 — ABNORMAL LOW
Sodium: 138
Sodium: 138

## 2011-02-05 LAB — BLOOD GAS, ARTERIAL
Acid-base deficit: 0.1
Drawn by: 229971
O2 Content: 2.5
O2 Saturation: 91.8
TCO2: 21.8

## 2011-02-05 LAB — D-DIMER, QUANTITATIVE: D-Dimer, Quant: 1.9 — ABNORMAL HIGH

## 2011-02-05 LAB — FERRITIN: Ferritin: 31 (ref 10–291)

## 2011-02-05 LAB — IRON AND TIBC
Iron: 21 — ABNORMAL LOW
Saturation Ratios: 7 — ABNORMAL LOW
TIBC: 292
UIBC: 271

## 2011-02-05 LAB — DIFFERENTIAL
Basophils Absolute: 0
Lymphocytes Relative: 18
Lymphs Abs: 2
Neutro Abs: 8.1 — ABNORMAL HIGH
Neutrophils Relative %: 73

## 2011-02-05 LAB — CK TOTAL AND CKMB (NOT AT ARMC)
CK, MB: 2.7
Relative Index: 2.2
Relative Index: 2.3
Total CK: 124
Total CK: 140

## 2011-02-05 LAB — LUPUS ANTICOAGULANT PANEL
DRVVT: 45.6 (ref 36.1–47.0)
PTT Lupus Anticoagulant: 135.5 — ABNORMAL HIGH (ref 36.3–48.8)
PTTLA 4:1 Mix: 110.4 — ABNORMAL HIGH (ref 36.3–48.8)

## 2011-02-05 LAB — LIPID PANEL
Cholesterol: 99
LDL Cholesterol: 45
Total CHOL/HDL Ratio: 2.7
Triglycerides: 84

## 2011-02-05 LAB — URINE CULTURE: Colony Count: NO GROWTH

## 2011-02-05 LAB — TROPONIN I
Troponin I: 0.08 — ABNORMAL HIGH
Troponin I: 0.14 — ABNORMAL HIGH

## 2011-02-05 LAB — SEDIMENTATION RATE: Sed Rate: 32 — ABNORMAL HIGH

## 2011-02-05 LAB — HEMOGLOBIN A1C
Hgb A1c MFr Bld: 9.1 — ABNORMAL HIGH
Mean Plasma Glucose: 247

## 2011-02-05 LAB — VITAMIN B12: Vitamin B-12: 1440 — ABNORMAL HIGH (ref 211–911)

## 2011-02-05 LAB — RETICULOCYTES
RBC.: 3.94
Retic Count, Absolute: 70.9
Retic Ct Pct: 1.8

## 2011-02-05 LAB — OCCULT BLOOD X 1 CARD TO LAB, STOOL: Fecal Occult Bld: NEGATIVE

## 2011-02-05 LAB — FOLATE: Folate: >20

## 2011-02-05 LAB — APTT: aPTT: 28

## 2011-02-06 LAB — BASIC METABOLIC PANEL
BUN: 18
BUN: 18
BUN: 53 — ABNORMAL HIGH
BUN: 68 — ABNORMAL HIGH
CO2: 20
CO2: 23
CO2: 24
CO2: 27
CO2: 30
CO2: 31
Calcium: 8 — ABNORMAL LOW
Calcium: 7.5 — ABNORMAL LOW
Calcium: 7.6 — ABNORMAL LOW
Calcium: 8.3 — ABNORMAL LOW
Calcium: 8.4
Chloride: 103
Chloride: 104
Chloride: 106
Chloride: 109
Chloride: 111
Creatinine, Ser: 1.01
Creatinine, Ser: 1.06
Creatinine, Ser: 1.08
Creatinine, Ser: 1.17
Creatinine, Ser: 1.19
Creatinine, Ser: 1.3 — ABNORMAL HIGH
GFR calc Af Amer: 47 — ABNORMAL LOW
GFR calc Af Amer: 48 — ABNORMAL LOW
GFR calc Af Amer: 52 — ABNORMAL LOW
GFR calc Af Amer: 54 — ABNORMAL LOW
GFR calc Af Amer: 54 — ABNORMAL LOW
GFR calc Af Amer: >60
GFR calc Af Amer: >60
GFR calc non Af Amer: 39 — ABNORMAL LOW
GFR calc non Af Amer: 43 — ABNORMAL LOW
GFR calc non Af Amer: 43 — ABNORMAL LOW
GFR calc non Af Amer: 46 — ABNORMAL LOW
GFR calc non Af Amer: 49 — ABNORMAL LOW
Glucose, Bld: 134 — ABNORMAL HIGH
Glucose, Bld: 181 — ABNORMAL HIGH
Glucose, Bld: 182 — ABNORMAL HIGH
Glucose, Bld: 192 — ABNORMAL HIGH
Glucose, Bld: 193 — ABNORMAL HIGH
Glucose, Bld: 205 — ABNORMAL HIGH
Glucose, Bld: 219 — ABNORMAL HIGH
Potassium: 3.5
Potassium: 3.8
Potassium: 3.9
Potassium: 4.2
Potassium: 4.3
Sodium: 138
Sodium: 135
Sodium: 138
Sodium: 139
Sodium: 140
Sodium: 140
Sodium: 142

## 2011-02-06 LAB — TROPONIN I
Troponin I: 0.8
Troponin I: 0.94

## 2011-02-06 LAB — PREPARE FRESH FROZEN PLASMA

## 2011-02-06 LAB — CROSSMATCH
ABO/RH(D): A POS
ABO/RH(D): A POS
Antibody Screen: NEGATIVE

## 2011-02-06 LAB — CARDIAC PANEL(CRET KIN+CKTOT+MB+TROPI)
CK, MB: 12.2 — ABNORMAL HIGH
CK, MB: 2
Relative Index: 10.3 — ABNORMAL HIGH
Relative Index: 4.1 — ABNORMAL HIGH
Total CK: 119
Total CK: 183 — ABNORMAL HIGH
Total CK: 146
Total CK: 71
Troponin I: 0.71
Troponin I: 2.36
Troponin I: 1.38

## 2011-02-06 LAB — HEMOGLOBIN AND HEMATOCRIT, BLOOD
HCT: 27.9 — ABNORMAL LOW
Hemoglobin: 9.4 — ABNORMAL LOW

## 2011-02-06 LAB — CBC
HCT: 18.4 — ABNORMAL LOW
HCT: 19.9 — ABNORMAL LOW
HCT: 19.9 — ABNORMAL LOW
HCT: 23.8 — ABNORMAL LOW
HCT: 30.6 — ABNORMAL LOW
HCT: 31.3 — ABNORMAL LOW
HCT: 23.3 — ABNORMAL LOW
HCT: 28.5 — ABNORMAL LOW
HCT: 30.6 — ABNORMAL LOW
HCT: 31.9 — ABNORMAL LOW
Hemoglobin: 6.7 — CL
Hemoglobin: 6.8 — CL
Hemoglobin: 10.2 — ABNORMAL LOW
Hemoglobin: 10.2 — ABNORMAL LOW
Hemoglobin: 10.8 — ABNORMAL LOW
Hemoglobin: 7.1 — CL
Hemoglobin: 8.1 — ABNORMAL LOW
Hemoglobin: 9.7 — ABNORMAL LOW
Hemoglobin: 9.9 — ABNORMAL LOW
MCHC: 33.5
MCHC: 33.8
MCHC: 34.3
MCHC: 34.3
MCHC: 33.3
MCHC: 33.8
MCHC: 33.9
MCHC: 34
MCHC: 34
MCHC: 34.1
MCV: 80
MCV: 80.4
MCV: 81.1
MCV: 87.3
MCV: 88.1
MCV: 88.8
MCV: 87.9
MCV: 88.2
MCV: 88.3
MCV: 88.3
MCV: 88.4
MCV: 89.3
Platelets: 108 — ABNORMAL LOW
Platelets: 126 — ABNORMAL LOW
Platelets: 129 — ABNORMAL LOW
Platelets: 266
Platelets: 276
Platelets: 103 — ABNORMAL LOW
Platelets: 212
Platelets: 60 — ABNORMAL LOW
RBC: 2.25 — ABNORMAL LOW
RBC: 2.26 — ABNORMAL LOW
RBC: 2.27 — ABNORMAL LOW
RBC: 2.73 — ABNORMAL LOW
RBC: 3.13 — ABNORMAL LOW
RBC: 3.23 — ABNORMAL LOW
RBC: 3.32 — ABNORMAL LOW
RBC: 3.42 — ABNORMAL LOW
RBC: 3.61 — ABNORMAL LOW
RDW: 16 — ABNORMAL HIGH
RDW: 16.2 — ABNORMAL HIGH
RDW: 18.3 — ABNORMAL HIGH
RDW: 18.9 — ABNORMAL HIGH
RDW: 16 — ABNORMAL HIGH
RDW: 16.4 — ABNORMAL HIGH
RDW: 16.5 — ABNORMAL HIGH
RDW: 16.5 — ABNORMAL HIGH
RDW: 16.7 — ABNORMAL HIGH
RDW: 17.3 — ABNORMAL HIGH
RDW: 18.3 — ABNORMAL HIGH
WBC: 12.1 — ABNORMAL HIGH
WBC: 12.4 — ABNORMAL HIGH
WBC: 6.3
WBC: 7.2
WBC: 11 — ABNORMAL HIGH
WBC: 6.8
WBC: 7.3
WBC: 7.9
WBC: 7.9

## 2011-02-06 LAB — CK TOTAL AND CKMB (NOT AT ARMC)
CK, MB: 2.3
CK, MB: 2.8
Relative Index: INVALID
Total CK: 85
Total CK: 90

## 2011-02-06 LAB — URINALYSIS, ROUTINE W REFLEX MICROSCOPIC
Nitrite: NEGATIVE
Protein, ur: NEGATIVE
Specific Gravity, Urine: 1.009
Urobilinogen, UA: 0.2

## 2011-02-06 LAB — BASIC METABOLIC PANEL WITH GFR
BUN: 78 — ABNORMAL HIGH
CO2: 21
Chloride: 110
Creatinine, Ser: 1.31 — ABNORMAL HIGH
Glucose, Bld: 151 — ABNORMAL HIGH
Potassium: 4

## 2011-02-06 LAB — MISCELLANEOUS TEST

## 2011-02-06 LAB — DIFFERENTIAL
Basophils Relative: 0
Eosinophils Absolute: 0
Eosinophils Relative: 0
Lymphs Abs: 1.4
Monocytes Relative: 4
Neutrophils Relative %: 84 — ABNORMAL HIGH

## 2011-02-06 LAB — DIC (DISSEMINATED INTRAVASCULAR COAGULATION)PANEL
INR: 1.4
Prothrombin Time: 17.9 — ABNORMAL HIGH
Smear Review: NONE SEEN

## 2011-02-06 LAB — PROTIME-INR
INR: 3 — ABNORMAL HIGH
INR: 3 — ABNORMAL HIGH
INR: >10.8
INR: 1.4
INR: 3.2 — ABNORMAL HIGH
INR: 4.3 — ABNORMAL HIGH
Prothrombin Time: 31.8 — ABNORMAL HIGH
Prothrombin Time: 32.3 — ABNORMAL HIGH
Prothrombin Time: 17.6 — ABNORMAL HIGH
Prothrombin Time: 18.6 — ABNORMAL HIGH
Prothrombin Time: 21.1 — ABNORMAL HIGH
Prothrombin Time: 34.4 — ABNORMAL HIGH
Prothrombin Time: 43.2 — ABNORMAL HIGH

## 2011-02-06 LAB — URINE MICROSCOPIC-ADD ON

## 2011-02-06 LAB — I-STAT 8, (EC8 V) (CONVERTED LAB)
Acid-base deficit: 7 — ABNORMAL HIGH
BUN: 77 — ABNORMAL HIGH
Bicarbonate: 18.9 — ABNORMAL LOW
Chloride: 106
Glucose, Bld: 249 — ABNORMAL HIGH
HCT: 22 — ABNORMAL LOW
Hemoglobin: 7.5 — CL
Operator id: 222501
Potassium: 4.6
Sodium: 137
TCO2: 20
pCO2, Ven: 41.2 — ABNORMAL LOW
pH, Ven: 7.27

## 2011-02-06 LAB — ABO/RH: ABO/RH(D): A POS

## 2011-02-06 LAB — POCT I-STAT CREATININE
Creatinine, Ser: 1.4 — ABNORMAL HIGH
Operator id: 222501

## 2011-02-06 LAB — HEPARIN LEVEL (UNFRACTIONATED)
Heparin Unfractionated: <0.1 — ABNORMAL LOW
Heparin Unfractionated: <0.1 — ABNORMAL LOW

## 2011-02-06 LAB — PREPARE RBC (CROSSMATCH)

## 2011-02-06 LAB — OCCULT BLOOD X 1 CARD TO LAB, STOOL: Fecal Occult Bld: POSITIVE

## 2011-02-06 LAB — URINE CULTURE: Colony Count: NO GROWTH

## 2011-06-20 ENCOUNTER — Other Ambulatory Visit: Payer: Self-pay

## 2011-06-20 ENCOUNTER — Encounter (HOSPITAL_COMMUNITY): Payer: Self-pay | Admitting: *Deleted

## 2011-06-20 ENCOUNTER — Emergency Department (HOSPITAL_COMMUNITY): Payer: 59

## 2011-06-20 ENCOUNTER — Inpatient Hospital Stay (HOSPITAL_COMMUNITY)
Admission: EM | Admit: 2011-06-20 | Discharge: 2011-06-30 | DRG: 689 | Disposition: A | Discharge status: Home / Self Care | Payer: 59 | Attending: Internal Medicine | Admitting: Internal Medicine

## 2011-06-20 DIAGNOSIS — Z9981 Dependence on supplemental oxygen: Secondary | ICD-10-CM

## 2011-06-20 DIAGNOSIS — R799 Abnormal finding of blood chemistry, unspecified: Secondary | ICD-10-CM | POA: Diagnosis present

## 2011-06-20 DIAGNOSIS — B3749 Other urogenital candidiasis: Secondary | ICD-10-CM | POA: Diagnosis present

## 2011-06-20 DIAGNOSIS — E236 Other disorders of pituitary gland: Secondary | ICD-10-CM | POA: Diagnosis present

## 2011-06-20 DIAGNOSIS — D638 Anemia in other chronic diseases classified elsewhere: Secondary | ICD-10-CM | POA: Diagnosis present

## 2011-06-20 DIAGNOSIS — I2510 Atherosclerotic heart disease of native coronary artery without angina pectoris: Secondary | ICD-10-CM | POA: Diagnosis present

## 2011-06-20 DIAGNOSIS — J9620 Acute and chronic respiratory failure, unspecified whether with hypoxia or hypercapnia: Secondary | ICD-10-CM | POA: Diagnosis present

## 2011-06-20 DIAGNOSIS — B373 Candidiasis of vulva and vagina: Secondary | ICD-10-CM

## 2011-06-20 DIAGNOSIS — J441 Chronic obstructive pulmonary disease with (acute) exacerbation: Secondary | ICD-10-CM | POA: Diagnosis present

## 2011-06-20 DIAGNOSIS — I10 Essential (primary) hypertension: Secondary | ICD-10-CM | POA: Diagnosis present

## 2011-06-20 DIAGNOSIS — N183 Chronic kidney disease, stage 3 unspecified: Secondary | ICD-10-CM | POA: Diagnosis present

## 2011-06-20 DIAGNOSIS — E875 Hyperkalemia: Secondary | ICD-10-CM | POA: Diagnosis not present

## 2011-06-20 DIAGNOSIS — D509 Iron deficiency anemia, unspecified: Secondary | ICD-10-CM | POA: Diagnosis present

## 2011-06-20 DIAGNOSIS — R197 Diarrhea, unspecified: Secondary | ICD-10-CM | POA: Diagnosis not present

## 2011-06-20 DIAGNOSIS — I129 Hypertensive chronic kidney disease with stage 1 through stage 4 chronic kidney disease, or unspecified chronic kidney disease: Secondary | ICD-10-CM | POA: Diagnosis present

## 2011-06-20 DIAGNOSIS — I498 Other specified cardiac arrhythmias: Secondary | ICD-10-CM | POA: Diagnosis present

## 2011-06-20 DIAGNOSIS — I38 Endocarditis, valve unspecified: Secondary | ICD-10-CM | POA: Diagnosis present

## 2011-06-20 DIAGNOSIS — I959 Hypotension, unspecified: Secondary | ICD-10-CM | POA: Diagnosis not present

## 2011-06-20 DIAGNOSIS — N12 Tubulo-interstitial nephritis, not specified as acute or chronic: Principal | ICD-10-CM | POA: Diagnosis present

## 2011-06-20 DIAGNOSIS — R5381 Other malaise: Secondary | ICD-10-CM | POA: Diagnosis not present

## 2011-06-20 DIAGNOSIS — I5041 Acute combined systolic (congestive) and diastolic (congestive) heart failure: Secondary | ICD-10-CM

## 2011-06-20 DIAGNOSIS — F068 Other specified mental disorders due to known physiological condition: Secondary | ICD-10-CM | POA: Diagnosis present

## 2011-06-20 DIAGNOSIS — I509 Heart failure, unspecified: Secondary | ICD-10-CM | POA: Diagnosis present

## 2011-06-20 DIAGNOSIS — R739 Hyperglycemia, unspecified: Secondary | ICD-10-CM

## 2011-06-20 DIAGNOSIS — J811 Chronic pulmonary edema: Secondary | ICD-10-CM | POA: Diagnosis present

## 2011-06-20 DIAGNOSIS — I5043 Acute on chronic combined systolic (congestive) and diastolic (congestive) heart failure: Secondary | ICD-10-CM | POA: Diagnosis present

## 2011-06-20 DIAGNOSIS — N179 Acute kidney failure, unspecified: Secondary | ICD-10-CM | POA: Diagnosis present

## 2011-06-20 DIAGNOSIS — Z794 Long term (current) use of insulin: Secondary | ICD-10-CM

## 2011-06-20 DIAGNOSIS — Z8673 Personal history of transient ischemic attack (TIA), and cerebral infarction without residual deficits: Secondary | ICD-10-CM

## 2011-06-20 DIAGNOSIS — D696 Thrombocytopenia, unspecified: Secondary | ICD-10-CM | POA: Diagnosis present

## 2011-06-20 DIAGNOSIS — E119 Type 2 diabetes mellitus without complications: Secondary | ICD-10-CM | POA: Diagnosis present

## 2011-06-20 DIAGNOSIS — N170 Acute kidney failure with tubular necrosis: Secondary | ICD-10-CM | POA: Diagnosis present

## 2011-06-20 DIAGNOSIS — I252 Old myocardial infarction: Secondary | ICD-10-CM

## 2011-06-20 DIAGNOSIS — E871 Hypo-osmolality and hyponatremia: Secondary | ICD-10-CM | POA: Diagnosis present

## 2011-06-20 DIAGNOSIS — Z86711 Personal history of pulmonary embolism: Secondary | ICD-10-CM

## 2011-06-20 DIAGNOSIS — J189 Pneumonia, unspecified organism: Secondary | ICD-10-CM | POA: Diagnosis present

## 2011-06-20 HISTORY — DX: Essential (primary) hypertension: I10

## 2011-06-20 HISTORY — DX: Unspecified disorder of circulatory system: I99.9

## 2011-06-20 HISTORY — DX: Shortness of breath: R06.02

## 2011-06-20 LAB — PRO B NATRIURETIC PEPTIDE: Pro B Natriuretic peptide (BNP): 6684 pg/mL — ABNORMAL HIGH (ref 0–450)

## 2011-06-20 LAB — WET PREP, GENITAL

## 2011-06-20 LAB — CBC
MCH: 26.9 pg (ref 26.0–34.0)
MCV: 83.2 fL (ref 78.0–100.0)
Platelets: ADEQUATE 10*3/uL (ref 150–400)
RDW: 16.4 % — ABNORMAL HIGH (ref 11.5–15.5)
WBC: 13.1 10*3/uL — ABNORMAL HIGH (ref 4.0–10.5)

## 2011-06-20 LAB — CARDIAC PANEL(CRET KIN+CKTOT+MB+TROPI)
Relative Index: INVALID (ref 0.0–2.5)
Relative Index: INVALID (ref 0.0–2.5)
Total CK: 77 U/L (ref 7–177)
Total CK: 88 U/L (ref 7–177)

## 2011-06-20 LAB — COMPREHENSIVE METABOLIC PANEL
ALT: 19 U/L (ref 0–35)
ALT: 22 U/L (ref 0–35)
AST: 19 U/L (ref 0–37)
AST: 20 U/L (ref 0–37)
Albumin: 2.9 g/dL — ABNORMAL LOW (ref 3.5–5.2)
Alkaline Phosphatase: 73 U/L (ref 39–117)
CO2: 20 mEq/L (ref 19–32)
Calcium: 9.1 mg/dL (ref 8.4–10.5)
Chloride: 92 mEq/L — ABNORMAL LOW (ref 96–112)
GFR calc non Af Amer: 25 mL/min — ABNORMAL LOW (ref >90)
Sodium: 130 mEq/L — ABNORMAL LOW (ref 135–145)
Sodium: 125 mEq/L — ABNORMAL LOW (ref 135–145)
Total Bilirubin: 0.6 mg/dL (ref 0.3–1.2)
Total Protein: 7.2 g/dL (ref 6.0–8.3)

## 2011-06-20 LAB — BLOOD GAS, ARTERIAL
Acid-base deficit: 0.2 mmol/L (ref 0.0–2.0)
Bicarbonate: 23.2 mEq/L (ref 20.0–24.0)
O2 Saturation: 88.7 %
Patient temperature: 98.6
TCO2: 21.5 mmol/L (ref 0–100)

## 2011-06-20 LAB — DIFFERENTIAL
Basophils Absolute: 0 10*3/uL (ref 0.0–0.1)
Eosinophils Absolute: 0.3 10*3/uL (ref 0.0–0.7)
Lymphs Abs: 0.8 10*3/uL (ref 0.7–4.0)
Monocytes Absolute: 1 10*3/uL (ref 0.1–1.0)
Monocytes Relative: 8 % (ref 3–12)

## 2011-06-20 LAB — GLUCOSE, CAPILLARY

## 2011-06-20 LAB — URINALYSIS, MICROSCOPIC ONLY
Specific Gravity, Urine: 1.023 (ref 1.005–1.030)
Urobilinogen, UA: 0.2 mg/dL (ref 0.0–1.0)

## 2011-06-20 LAB — LIPASE, BLOOD: Lipase: 67 U/L — ABNORMAL HIGH (ref 11–59)

## 2011-06-20 MED ORDER — DEXTROSE 5 % IV SOLN: 1.0000 g | INTRAVENOUS | Status: DC

## 2011-06-20 MED ORDER — FLUCONAZOLE 150 MG PO TABS
150.0000 mg | ORAL_TABLET | Freq: Once | ORAL | Status: AC
  Administered 2011-06-20: 150 mg via ORAL
  Filled 2011-06-20: qty 1

## 2011-06-20 MED ORDER — XENON XE 133 GAS: 8.6000 | GAS_FOR_INHALATION | Freq: Once | RESPIRATORY_TRACT | Status: AC | PRN

## 2011-06-20 MED ORDER — METOPROLOL TARTRATE 12.5 MG HALF TABLET
12.5000 mg | ORAL_TABLET | Freq: Two times a day (BID) | ORAL | Status: DC
  Administered 2011-06-20 – 2011-06-26 (×12): 12.5 mg via ORAL
  Filled 2011-06-20 (×13): qty 1

## 2011-06-20 MED ORDER — ALBUTEROL SULFATE (5 MG/ML) 0.5% IN NEBU
2.5000 mg | INHALATION_SOLUTION | RESPIRATORY_TRACT | Status: DC | PRN
  Administered 2011-06-20 – 2011-06-28 (×4): 2.5 mg via RESPIRATORY_TRACT
  Filled 2011-06-20 (×3): qty 0.5

## 2011-06-20 MED ORDER — ASPIRIN EC 81 MG PO TBEC
81.0000 mg | DELAYED_RELEASE_TABLET | Freq: Every evening | ORAL | Status: DC
  Administered 2011-06-20 – 2011-06-29 (×10): 81 mg via ORAL
  Filled 2011-06-20 (×12): qty 1

## 2011-06-20 MED ORDER — FUROSEMIDE 10 MG/ML IJ SOLN
60.0000 mg | Freq: Once | INTRAMUSCULAR | Status: AC
  Administered 2011-06-20: 60 mg via INTRAVENOUS
  Filled 2011-06-20: qty 6

## 2011-06-20 MED ORDER — TECHNETIUM TO 99M ALBUMIN AGGREGATED: 4.3000 | Freq: Once | INTRAVENOUS | Status: AC | PRN

## 2011-06-20 MED ORDER — VANCOMYCIN HCL 1000 MG IV SOLR
750.0000 mg | INTRAVENOUS | Status: DC
  Administered 2011-06-20: 750 mg via INTRAVENOUS
  Filled 2011-06-20 (×2): qty 750

## 2011-06-20 MED ORDER — IPRATROPIUM-ALBUTEROL 18-103 MCG/ACT IN AERO
2.0000 | INHALATION_SPRAY | RESPIRATORY_TRACT | Status: DC | PRN
  Filled 2011-06-20: qty 14.7

## 2011-06-20 MED ORDER — ONDANSETRON HCL 4 MG/2ML IJ SOLN
4.0000 mg | Freq: Four times a day (QID) | INTRAMUSCULAR | Status: DC | PRN
  Administered 2011-06-22 – 2011-06-28 (×9): 4 mg via INTRAVENOUS
  Filled 2011-06-20 (×9): qty 2

## 2011-06-20 MED ORDER — ACETAMINOPHEN 650 MG RE SUPP: 650.0000 mg | Freq: Four times a day (QID) | RECTAL | Status: DC | PRN

## 2011-06-20 MED ORDER — IOHEXOL 300 MG/ML  SOLN
100.0000 mL | Freq: Once | INTRAMUSCULAR | Status: AC | PRN
  Administered 2011-06-20: 100 mL via INTRAVENOUS

## 2011-06-20 MED ORDER — ALPRAZOLAM 0.25 MG PO TABS
0.2500 mg | ORAL_TABLET | Freq: Every day | ORAL | Status: DC
  Administered 2011-06-20 – 2011-06-28 (×8): 0.25 mg via ORAL
  Filled 2011-06-20 (×8): qty 1

## 2011-06-20 MED ORDER — PIPERACILLIN-TAZOBACTAM 3.375 G IVPB
3.3750 g | Freq: Three times a day (TID) | INTRAVENOUS | Status: DC
  Administered 2011-06-20 – 2011-06-22 (×5): 3.375 g via INTRAVENOUS
  Filled 2011-06-20 (×6): qty 50

## 2011-06-20 MED ORDER — TRAMADOL HCL 50 MG PO TABS
50.0000 mg | ORAL_TABLET | Freq: Three times a day (TID) | ORAL | Status: DC | PRN
  Administered 2011-06-20 – 2011-06-30 (×3): 50 mg via ORAL
  Filled 2011-06-20 (×4): qty 1

## 2011-06-20 MED ORDER — ENOXAPARIN SODIUM 40 MG/0.4ML ~~LOC~~ SOLN
40.0000 mg | SUBCUTANEOUS | Status: DC
  Administered 2011-06-20: 40 mg via SUBCUTANEOUS
  Filled 2011-06-20 (×2): qty 0.4

## 2011-06-20 MED ORDER — DEXTROSE 5 % IV SOLN
1.0000 g | Freq: Once | INTRAVENOUS | Status: AC
  Administered 2011-06-20: 1 g via INTRAVENOUS
  Filled 2011-06-20: qty 10

## 2011-06-20 MED ORDER — DIGOXIN 125 MCG PO TABS
125.0000 ug | ORAL_TABLET | ORAL | Status: DC
  Administered 2011-06-20: 125 ug via ORAL
  Filled 2011-06-20 (×2): qty 1

## 2011-06-20 MED ORDER — ACETAMINOPHEN 325 MG PO TABS
650.0000 mg | ORAL_TABLET | Freq: Four times a day (QID) | ORAL | Status: DC | PRN
  Administered 2011-06-20 – 2011-06-28 (×9): 650 mg via ORAL
  Filled 2011-06-20 (×10): qty 2

## 2011-06-20 MED ORDER — ONDANSETRON HCL 4 MG PO TABS
4.0000 mg | ORAL_TABLET | Freq: Four times a day (QID) | ORAL | Status: DC | PRN
  Filled 2011-06-20: qty 1

## 2011-06-20 MED ORDER — TAMSULOSIN HCL 0.4 MG PO CAPS
0.4000 mg | ORAL_CAPSULE | Freq: Every day | ORAL | Status: DC
  Administered 2011-06-21 – 2011-06-24 (×4): 0.4 mg via ORAL
  Filled 2011-06-20 (×4): qty 1

## 2011-06-20 MED ORDER — INSULIN GLARGINE 100 UNIT/ML ~~LOC~~ SOLN
15.0000 [IU] | Freq: Once | SUBCUTANEOUS | Status: AC
  Administered 2011-06-20: 15 [IU] via SUBCUTANEOUS
  Filled 2011-06-20: qty 3

## 2011-06-20 MED ORDER — FLUTICASONE PROPIONATE 50 MCG/ACT NA SUSP
1.0000 | Freq: Every day | NASAL | Status: DC
  Administered 2011-06-21 – 2011-06-29 (×9): 1 via NASAL
  Filled 2011-06-20: qty 16

## 2011-06-20 MED ORDER — INSULIN ASPART 100 UNIT/ML ~~LOC~~ SOLN
20.0000 [IU] | Freq: Once | SUBCUTANEOUS | Status: AC
  Administered 2011-06-20: 20 [IU] via SUBCUTANEOUS

## 2011-06-20 MED ORDER — SODIUM CHLORIDE 0.9 % IV SOLN
Freq: Once | INTRAVENOUS | Status: AC
Start: 2011-06-20 — End: 2011-06-20
  Administered 2011-06-20: 11:00:00 via INTRAVENOUS

## 2011-06-20 MED ORDER — INSULIN ASPART 100 UNIT/ML ~~LOC~~ SOLN
0.0000 [IU] | SUBCUTANEOUS | Status: DC
  Administered 2011-06-21: 15 [IU] via SUBCUTANEOUS
  Filled 2011-06-20: qty 3

## 2011-06-20 MED ORDER — PIPERACILLIN-TAZOBACTAM 3.375 G IVPB 30 MIN
3.3750 g | Freq: Once | INTRAVENOUS | Status: AC
  Administered 2011-06-20: 3.375 g via INTRAVENOUS
  Filled 2011-06-20: qty 50

## 2011-06-20 MED ORDER — INSULIN ISOPHANE & REGULAR (50-50) 100 UNIT/ML ~~LOC~~ SUSP: 50.0000 [IU] | Freq: Two times a day (BID) | SUBCUTANEOUS | Status: DC

## 2011-06-20 MED ORDER — ALUM & MAG HYDROXIDE-SIMETH 200-200-20 MG/5ML PO SUSP: 30.0000 mL | Freq: Four times a day (QID) | ORAL | Status: DC | PRN

## 2011-06-20 MED ORDER — FLUCONAZOLE 100 MG PO TABS
100.0000 mg | ORAL_TABLET | Freq: Every day | ORAL | Status: DC
  Administered 2011-06-21: 100 mg via ORAL
  Filled 2011-06-20 (×2): qty 1

## 2011-06-20 NOTE — ED Provider Notes (Signed)
10:30 AM Pelvic exam performed. Atrophic labia. Cystocele visible, non-tender. Vaginal walls pink and moist without lesion, small amt yellow discharge lining vaginal walls. Cervix absent (pt is s/p hysterectomy). No adnexal mass or tenderness palpated. EDP notified of examination findings.  Shaaron Adler, [place] 06/20/11 1135

## 2011-06-20 NOTE — H&P (Signed)
[name] MRN: [id number] DOB/AGE: 76-Mar-1927 76 y.o. Primary Care Physician:CLARK,PRESTON S, MD, MD Admit date: 06/20/2011 Chief Complaint: suprapubic pain  HPI: 78 YR  Old with  Hx of  CHF on home oxygen presenting with 2 weeks of lower abdominal pain and pressure with associated vaginal pain and itching. Seen by PCP and treated for UTI and improved with pyridium. Vaginal symptoms improved then returned 3 days ago. Was told that her symtoms were due to vaginal dryness . +Dysuria, no hematuria. +suprapubic pain and flank pain. No nausea, vomiting, chest pain, she has had some SOB at rest releived by receiving iv lasix in the ER , she has a hx of copd . No cough or fever. Generalized weakness with decreased PO intake for the past two days and inability to ambulate .  Past Medical History  Diagnosis Date  . Diabetes mellitus   . Hypertension   . Vascular disease   CHF EF OF 35-40%,LAST ECHO IN 2011 1. Pulmonary embolus on June 10, 2007.now has an IVC filter in place   2. History of cerebrovascular accident.  3. Insulin-dependent diabetic.  4. Myocardial infarction.  5. Coronary artery disease.  6. Lower GI bleed in March 2006, that Dr. Evette Cristal believed was secondary  to diverticulosis.  7. The patient tells me she is blind.  8. She had a colonoscopy done in 1993, that showed internal  hemorrhoids and diverticulosis. She had another colonoscopy done  in 2001, that showed internal and external hemorrhoids with  significant left diverticulosis with fresh blood in the descending  colon.  Past Surgical History  Procedure Date  . Bladder tack   . Femoral artery stent   hysterectomy  Prior to Admission medications   Medication Sig Start Date End Date Taking? Authorizing Provider  albuterol-ipratropium (COMBIVENT) 18-103 MCG/ACT inhaler Inhale 2 puffs into the lungs every 6 (six) hours as needed. For shortness of breath   Yes Historical Provider, MD  allopurinol (ZYLOPRIM) 100 MG  tablet Take 100 mg by mouth at bedtime.   Yes Historical Provider, MD  ALPRAZolam (XANAX) 0.25 MG tablet Take 0.25 mg by mouth at bedtime.   Yes Historical Provider, MD  Ascorbic Acid (VITAMIN C) 100 MG tablet Take 100 mg by mouth daily.   Yes Historical Provider, MD  aspirin EC 81 MG tablet Take 81 mg by mouth every evening.   Yes Historical Provider, MD  atorvastatin (LIPITOR) 20 MG tablet Take 20 mg by mouth at bedtime.   Yes Historical Provider, MD  B Complex-C (B-COMPLEX WITH VITAMIN C) tablet Take 1 tablet by mouth daily.   Yes Historical Provider, MD  Calcium-Magnesium-Zinc 1000-400-15 MG TABS Take 1 tablet by mouth daily.   Yes Historical Provider, MD  Chlorphen-Pseudoephed-APAP (CORICIDIN D) 2-30-325 MG TABS Take 1 tablet by mouth daily as needed. For cough and chest congestion   Yes Historical Provider, MD  Cinnamon 500 MG capsule Take 1,000 mg by mouth daily.   Yes Historical Provider, MD  digoxin ([name]) 0.125 MG tablet Take 125 mcg by mouth every other day.   Yes Historical Provider, MD  famotidine (PEPCID) 20 MG tablet Take 20 mg by mouth at bedtime.   Yes Historical Provider, MD  furosemide (LASIX) 40 MG tablet Take 40 mg by mouth 2 (two) times daily.   Yes Historical Provider, MD  insulin NPH-insulin regular (NOVOLIN 50/50) (50-50) 100 UNIT/ML injection Inject 55-80 Units into the skin 2 (two) times daily before a meal. 80 units in the am and  55 units in the pm   Yes Historical Provider, MD  Iron-Vitamins (GERITOL COMPLETE PO) Take 2 tablets by mouth daily.   Yes Historical Provider, MD  isosorbide mononitrate (IMDUR) 30 MG 24 hr tablet Take 15 mg by mouth daily.   Yes Historical Provider, MD  metoprolol tartrate (LOPRESSOR) 25 MG tablet Take 12.5 mg by mouth 2 (two) times daily.   Yes Historical Provider, MD  mineral oil liquid Take 5 mLs by mouth daily as needed. For constipation   Yes Historical Provider, MD  mometasone (NASONEX) 50 MCG/ACT nasal spray Place 2 sprays into the  nose at bedtime as needed. For congestion   Yes Historical Provider, MD  Multiple Vitamins-Minerals (ICAPS) CAPS Take 1 capsule by mouth 2 (two) times daily.   Yes Historical Provider, MD  omega-3 acid ethyl esters (LOVAZA) 1 G capsule Take 2 g by mouth daily.   Yes Historical Provider, MD  phenazopyridine (PYRIDIUM) 200 MG tablet Take 200 mg by mouth every 12 (twelve) hours.   Yes Historical Provider, MD  potassium chloride (K-DUR) 10 MEQ tablet Take 10 mEq by mouth daily.   Yes Historical Provider, MD  sodium chloride (OCEAN) 0.65 % nasal spray Place 1 spray into the nose at bedtime as needed. For congestion   Yes Historical Provider, MD    Allergies:  Allergies  Allergen Reactions  . Avelox (Moxifloxacin Hcl In Nacl)   . Coumadin Other (See Comments)    Causes severe bleeding  . Proton Pump Inhibitors     No family history on file. FAMILY HISTORY: Negative for ulcer disease, negative for colon cancer.  SOCIAL HISTORY: The patient has two daughters that her at bedside. She  does not smoke tobacco or drink alcohol.          ROS: A COMPLETE 14 POINT REVIEW OF SYSTEMS WAS DONE AS IN HPI  PHYSICAL EXAM: Blood pressure 127/55, pulse 103, temperature 97.6 F (36.4 C), temperature source Oral, resp. rate 18, SpO2 97.00%. .Physical Exam  Constitutional: She is oriented to person, place, and time. She appears well-developed and well-nourished. No distress.  HENT:  Head: Normocephalic and atraumatic.  Mouth/Throat: Oropharynx is clear and moist. No oropharyngeal exudate.  Eyes: Conjunctivae and EOM are normal. Pupils are equal, round, and reactive to light.  Neck: Normal range of motion. Neck supple.  Cardiovascular: Normal rate, regular rhythm and normal heart sounds.  Pulmonary/Chest: Effort normal and breath sounds normal. No respiratory distress. She has no wheezes.  Abdominal: Soft. There is tenderness. There is no rebound and no guarding.  Mild suprapubic tenderness    Musculoskeletal: Normal range of motion. She exhibits no edema and no tenderness.  Neurological: She is alert and oriented to person, place, and time. No cranial nerve deficit.  5/5 strength throughout, no focal deficits.  Skin: Skin is warm.    Recent Results (from the past 240 hour(s))  WET PREP, GENITAL     Status: Abnormal   Collection Time   06/20/11 10:30 AM      Component Value Range Status Comment   Yeast Wet Prep HPF POC FEW (*) NONE SEEN  Final    Trich, Wet Prep NONE SEEN  NONE SEEN  Final    Clue Cells Wet Prep HPF POC FEW (*) NONE SEEN  Final    WBC, Wet Prep HPF POC MODERATE (*) NONE SEEN  Final      Results for orders placed during the hospital encounter of 06/20/11 (from the past 48 hour(s))  URINALYSIS, WITH MICROSCOPIC     Status: Abnormal   Collection Time   06/20/11  9:26 AM      Component Value Range Comment   Color, Urine ORANGE (*) YELLOW  BIOCHEMICALS MAY BE AFFECTED BY COLOR   APPearance TURBID (*) CLEAR     Specific Gravity, Urine 1.023  1.005 - 1.030     pH 5.5  5.0 - 8.0     Glucose, UA >1000 (*) NEGATIVE (mg/dL)    Hgb urine dipstick SMALL (*) NEGATIVE     Bilirubin Urine NEGATIVE  NEGATIVE     Ketones, ur NEGATIVE  NEGATIVE (mg/dL)    Protein, ur 161 (*) NEGATIVE (mg/dL)    Urobilinogen, UA 0.2  0.0 - 1.0 (mg/dL)    Nitrite POSITIVE (*) NEGATIVE     Leukocytes, UA LARGE (*) NEGATIVE     WBC, UA TOO NUMEROUS TO COUNT  <3 (WBC/hpf)    RBC / HPF 0-2  <3 (RBC/hpf)    Bacteria, UA FEW (*) RARE     Urine-Other MANY YEAST     CBC     Status: Abnormal   Collection Time   06/20/11 10:00 AM      Component Value Range Comment   WBC 13.1 (*) 4.0 - 10.5 (K/uL)    RBC 3.46 (*) 3.87 - 5.11 (MIL/uL)    Hemoglobin 9.3 (*) 12.0 - 15.0 (g/dL)    HCT 09.6 (*) 04.5 - 46.0 (%)    MCV 83.2  78.0 - 100.0 (fL)    MCH 26.9  26.0 - 34.0 (pg)    MCHC 32.3  30.0 - 36.0 (g/dL)    RDW 40.9 (*) 81.1 - 15.5 (%)    Platelets    150 - 400 (K/uL) SPECIMEN CHECKED FOR CLOTS    Value: PLATELET CLUMPS NOTED ON SMEAR, COUNT APPEARS ADEQUATE  DIFFERENTIAL     Status: Abnormal   Collection Time   06/20/11 10:00 AM      Component Value Range Comment   Neutrophils Relative 84 (*) 43 - 77 (%)    Lymphocytes Relative 6 (*) 12 - 46 (%)    Monocytes Relative 8  3 - 12 (%)    Eosinophils Relative 2  0 - 5 (%)    Basophils Relative 0  0 - 1 (%)    Neutro Abs 11.0 (*) 1.7 - 7.7 (K/uL)    Lymphs Abs 0.8  0.7 - 4.0 (K/uL)    Monocytes Absolute 1.0  0.1 - 1.0 (K/uL)    Eosinophils Absolute 0.3  0.0 - 0.7 (K/uL)    Basophils Absolute 0.0  0.0 - 0.1 (K/uL)   COMPREHENSIVE METABOLIC PANEL     Status: Abnormal   Collection Time   06/20/11 10:00 AM      Component Value Range Comment   Sodium 130 (*) 135 - 145 (mEq/L)    Potassium 4.9  3.5 - 5.1 (mEq/L)    Chloride 95 (*) 96 - 112 (mEq/L)    CO2 22  19 - 32 (mEq/L)    Glucose, Bld 381 (*) 70 - 99 (mg/dL)    BUN 41 (*) 6 - 23 (mg/dL)    Creatinine, Ser 9.14 (*) 0.50 - 1.10 (mg/dL)    Calcium 9.1  8.4 - 10.5 (mg/dL)    Total Protein 7.2  6.0 - 8.3 (g/dL)    Albumin 2.9 (*) 3.5 - 5.2 (g/dL)    AST 19  0 - 37 (U/L)    ALT 19  0 -  35 (U/L)    Alkaline Phosphatase 71  39 - 117 (U/L)    Total Bilirubin 0.7  0.3 - 1.2 (mg/dL)    GFR calc non Af Amer 34 (*) >90 (mL/min)    GFR calc Af Amer 40 (*) >90 (mL/min)   LIPASE, BLOOD     Status: Abnormal   Collection Time   06/20/11 10:00 AM      Component Value Range Comment   Lipase 88 (*) 11 - 59 (U/L)   PRO B NATRIURETIC PEPTIDE     Status: Abnormal   Collection Time   06/20/11 10:00 AM      Component Value Range Comment   Pro B Natriuretic peptide (BNP) 6684.0 (*) 0 - 450 (pg/mL)   CARDIAC PANEL(CRET KIN+CKTOT+MB+TROPI)     Status: Normal   Collection Time   06/20/11 10:00 AM      Component Value Range Comment   Total CK 77  7 - 177 (U/L)    CK, MB 3.0  0.3 - 4.0 (ng/mL)    Troponin I <0.30  <0.30 (ng/mL)    Relative Index RELATIVE INDEX IS INVALID  0.0 - 2.5    WET PREP, GENITAL      Status: Abnormal   Collection Time   06/20/11 10:30 AM      Component Value Range Comment   Yeast Wet Prep HPF POC FEW (*) NONE SEEN     Trich, Wet Prep NONE SEEN  NONE SEEN     Clue Cells Wet Prep HPF POC FEW (*) NONE SEEN     WBC, Wet Prep HPF POC MODERATE (*) NONE SEEN    GLUCOSE, CAPILLARY     Status: Abnormal   Collection Time   06/20/11 10:37 AM      Component Value Range Comment   Glucose-Capillary 344 (*) 70 - 99 (mg/dL)   LACTIC ACID, PLASMA     Status: Normal   Collection Time   06/20/11 11:15 AM      Component Value Range Comment   Lactic Acid, Venous 1.7  0.5 - 2.2 (mmol/L)   BLOOD GAS, ARTERIAL     Status: Abnormal   Collection Time   06/20/11  1:20 PM      Component Value Range Comment   O2 Content 3.0      Delivery systems NASAL CANNULA      pH, Arterial 7.438 (*) 7.350 - 7.400     pCO2 arterial 34.9 (*) 35.0 - 45.0 (mmHg)    pO2, Arterial 55.6 (*) 80.0 - 100.0 (mmHg)    Bicarbonate 23.2  20.0 - 24.0 (mEq/L)    TCO2 21.5  0 - 100 (mmol/L)    Acid-base deficit 0.2  0.0 - 2.0 (mmol/L)    O2 Saturation 88.7      Patient temperature 98.6      Collection site RIGHT RADIAL      Drawn by [id number]      Sample type ARTERIAL DRAW      Allens test (pass/fail) PASS  PASS      Dg Abd Acute W/chest  06/20/2011  *RADIOLOGY REPORT*  Clinical Data: Left mid abdominal pain, discomfort  ACUTE ABDOMEN SERIES (ABDOMEN 2 VIEW & CHEST 1 VIEW)  Comparison: 06/10/2010.  Findings:Cardiac leads overlie the chest.  Mitral annular calcification accounts for coarse radiodensities over the cardiac silhouettes. Lung volumes are low with crowding of the bronchovascular markings.  Allowing for this, no focal pulmonary opacity is seen.  IVC filter in place.  No  free air beneath the diaphragms or on the decubitus projection.  Splenic arterial calcification noted without definite calcified aneurysm.  Normal bowel gas pattern.  No acute osseous abnormality.  IMPRESSION: Normal bowel gas pattern.  Low lung  volumes. If the patient's symptoms continue, consider PA and lateral chest radiographs obtained at full inspiration when the patient is clinically able.  Original Report Authenticated By: Harrel Lemon, M.D.    Impression:  Active Problems:  Pyelonephritis  Diabetes mellitus  Hypertension SOB SECONDARY TO PE VS CHF EXACERBATION? SINUS TACHYCARDIA HYPONATREMIA SECONDRY TO PSEUDOHYPONATREMIA  AKI    Plan: ADMIT to tele Broad spectrum antibiotics namely vanc and zosyn for pressumed pna and pyelonephritis panculture  CT abdomen and pelvis to r/o infectious process, sichemia Iv lasix for hx of chf , No ACE due to AKI Repeat 2 d echo R/o acs  D dimer , if elevated VQ to r/o PE  Full code      [facility] 06/20/2011, 2:34 PM

## 2011-06-20 NOTE — ED Provider Notes (Signed)
Medical screening examination/treatment/procedure(s) were performed by non-physician practitioner and as supervising physician I was immediately available for consultation/collaboration.   Glynn Octave, MD 06/20/11 [phone]

## 2011-06-20 NOTE — ED Notes (Signed)
Patient transported to X-ray 

## 2011-06-20 NOTE — ED Notes (Signed)
Notified Md because patients O2 levels  were decreasing to 85-88% despite increase oxygen to 4 liters. VS  BP 150/70  HR 107 [id number]  Temp. 98.3. Patient denies any pain at this time.

## 2011-06-20 NOTE — ED Provider Notes (Signed)
History     CSN: [id number]  Arrival date & time 06/20/11  0901   First MD Initiated Contact with Patient 06/20/11 [phone]      Chief Complaint  Patient presents with  . Weakness    Pt comes from home where she is cared for by family.  Pt's family states pt has had sensation of bladder pressure and vaginal itching x2 weeks and is being treated by PCP. Pt has had urinary incontinence at home and that is not her baseline. Family also states pt has been weak x2 days. Pt c/o low abd and vaginal pain. Pt denies hematuria. Pt's daughter states pt's CBG's have been elevated.  . Vaginal Itching    (Consider location/radiation/quality/duration/timing/severity/associated sxs/prior treatment) HPI Comments: Hx CHF on home oxygen presenting with 2 weeks of lower abdominal pain and pressure with associated vaginal pain and itching.  Seen by PCP and treated for UTI and improved with pyridium.  Vaginal symptoms improved then returned 3 days ago.  Was told may be hormone problem.  +Dysuria, no hematuria.  +suprapubic pain and flank pain.  No nausea, vomiting, chest pain, SOB.  No cough or fever.  Generalized weakness with decreased PO intake for the past two days.  The history is provided by the patient and a relative.    Past Medical History  Diagnosis Date  . Diabetes mellitus   . Hypertension   . Vascular disease     Past Surgical History  Procedure Date  . Bladder tack   . Femoral artery stent     No family history on file.  History  Substance Use Topics  . Smoking status: Never Smoker   . Smokeless tobacco: Not on file  . Alcohol Use: No    OB History    Grav Para Term Preterm Abortions TAB SAB Ect Mult Living                  Review of Systems  Constitutional: Positive for activity change, appetite change and fatigue.  HENT: Negative for congestion and rhinorrhea.   Respiratory: Negative for cough, chest tightness and shortness of breath.   Cardiovascular: Negative for chest  pain.  Gastrointestinal: Positive for abdominal pain. Negative for nausea and vomiting.  Genitourinary: Positive for dysuria, flank pain, vaginal pain and pelvic pain. Negative for vaginal bleeding.  Musculoskeletal: Positive for back pain.  Skin: Negative for rash.  Neurological: Positive for weakness. Negative for dizziness, light-headedness and headaches.    Allergies  Avelox; Coumadin; and Proton pump inhibitors  Home Medications   Current Outpatient Rx  Name Route Sig Dispense Refill  . IPRATROPIUM-ALBUTEROL 18-103 MCG/ACT IN AERO Inhalation Inhale 2 puffs into the lungs every 6 (six) hours as needed. For shortness of breath    . ALLOPURINOL 100 MG PO TABS Oral Take 100 mg by mouth at bedtime.    . ALPRAZOLAM 0.25 MG PO TABS Oral Take 0.25 mg by mouth at bedtime.    Marland Kitchen VITAMIN C 100 MG PO TABS Oral Take 100 mg by mouth daily.    . ASPIRIN EC 81 MG PO TBEC Oral Take 81 mg by mouth every evening.    . ATORVASTATIN CALCIUM 20 MG PO TABS Oral Take 20 mg by mouth at bedtime.    . B COMPLEX-C PO TABS Oral Take 1 tablet by mouth daily.    Marland Kitchen CALCIUM-MAGNESIUM-ZINC 1000-400-15 MG PO TABS Oral Take 1 tablet by mouth daily.    . CHLORPHEN-PSEUDOEPHED-APAP 2-30-325 MG PO TABS Oral Take  1 tablet by mouth daily as needed. For cough and chest congestion    . CINNAMON 500 MG PO CAPS Oral Take 1,000 mg by mouth daily.    Marland Kitchen DIGOXIN 0.125 MG PO TABS Oral Take 125 mcg by mouth every other day.    Marland Kitchen FAMOTIDINE 20 MG PO TABS Oral Take 20 mg by mouth at bedtime.    . FUROSEMIDE 40 MG PO TABS Oral Take 40 mg by mouth 2 (two) times daily.    . INSULIN ISOPHANE & REGULAR (50-50) 100 UNIT/ML [place] SUSP Subcutaneous Inject 55-80 Units into the skin 2 (two) times daily before a meal. 80 units in the am and 55 units in the pm    . GERITOL COMPLETE PO Oral Take 2 tablets by mouth daily.    . ISOSORBIDE MONONITRATE ER 30 MG PO TB24 Oral Take 15 mg by mouth daily.    Marland Kitchen METOPROLOL TARTRATE 25 MG PO TABS Oral Take 12.5  mg by mouth 2 (two) times daily.    Marland Kitchen MINERAL OIL PO OIL Oral Take 5 mLs by mouth daily as needed. For constipation    . MOMETASONE FUROATE 50 MCG/ACT NA SUSP Nasal Place 2 sprays into the nose at bedtime as needed. For congestion    . ICAPS PO CAPS Oral Take 1 capsule by mouth 2 (two) times daily.    . OMEGA-3-ACID ETHYL ESTERS 1 G PO CAPS Oral Take 2 g by mouth daily.    Marland Kitchen PHENAZOPYRIDINE HCL 200 MG PO TABS Oral Take 200 mg by mouth every 12 (twelve) hours.    Marland Kitchen POTASSIUM CHLORIDE ER 10 MEQ PO TBCR Oral Take 10 mEq by mouth daily.    Marland Kitchen SALINE NASAL SPRAY 0.65 % NA SOLN Nasal Place 1 spray into the nose at bedtime as needed. For congestion      BP 150/62  Pulse 117  Temp(Src) 98 F (36.7 C) (Oral)  Resp 29  Ht 5\' 3"  (1.6 m)  Wt 140 lb (63.504 kg)  BMI 24.80 kg/m2  SpO2 90%  Physical Exam  Constitutional: She is oriented to person, place, and time. She appears well-developed and well-nourished. No distress.  HENT:  Head: Normocephalic and atraumatic.  Mouth/Throat: Oropharynx is clear and moist. No oropharyngeal exudate.  Eyes: Conjunctivae and EOM are normal. Pupils are equal, round, and reactive to light.  Neck: Normal range of motion. Neck supple.  Cardiovascular: Normal rate, regular rhythm and normal heart sounds.   Pulmonary/Chest: Effort normal and breath sounds normal. No respiratory distress. She has no wheezes.  Abdominal: Soft. There is tenderness. There is no rebound and no guarding.       Mild suprapubic tenderness  Musculoskeletal: Normal range of motion. She exhibits no edema and no tenderness.  Neurological: She is alert and oriented to person, place, and time. No cranial nerve deficit.       5/5 strength throughout, no focal deficits.  Skin: Skin is warm.    ED Course  Procedures (including critical care time)  Labs Reviewed  URINALYSIS, WITH MICROSCOPIC - Abnormal; Notable for the following:    Color, Urine ORANGE (*) BIOCHEMICALS MAY BE AFFECTED BY COLOR     APPearance TURBID (*)    Glucose, UA >1000 (*)    Hgb urine dipstick SMALL (*)    Protein, ur 100 (*)    Nitrite POSITIVE (*)    Leukocytes, UA LARGE (*)    Bacteria, UA FEW (*)    All other components within normal limits  CBC - Abnormal; Notable for the following:    WBC 13.1 (*)    RBC 3.46 (*)    Hemoglobin 9.3 (*)    HCT 28.8 (*)    RDW 16.4 (*)    All other components within normal limits  DIFFERENTIAL - Abnormal; Notable for the following:    Neutrophils Relative 84 (*)    Lymphocytes Relative 6 (*)    Neutro Abs 11.0 (*)    All other components within normal limits  COMPREHENSIVE METABOLIC PANEL - Abnormal; Notable for the following:    Sodium 130 (*)    Chloride 95 (*)    Glucose, Bld 381 (*)    BUN 41 (*)    Creatinine, Ser 1.38 (*)    Albumin 2.9 (*)    GFR calc non Af Amer 34 (*)    GFR calc Af Amer 40 (*)    All other components within normal limits  WET PREP, GENITAL - Abnormal; Notable for the following:    Yeast Wet Prep HPF POC FEW (*)    Clue Cells Wet Prep HPF POC FEW (*)    WBC, Wet Prep HPF POC MODERATE (*)    All other components within normal limits  LIPASE, BLOOD - Abnormal; Notable for the following:    Lipase 88 (*)    All other components within normal limits  PRO B NATRIURETIC PEPTIDE - Abnormal; Notable for the following:    Pro B Natriuretic peptide (BNP) 6684.0 (*)    All other components within normal limits  GLUCOSE, CAPILLARY - Abnormal; Notable for the following:    Glucose-Capillary 344 (*)    All other components within normal limits  BLOOD GAS, ARTERIAL - Abnormal; Notable for the following:    pH, Arterial 7.438 (*)    pCO2 arterial 34.9 (*)    pO2, Arterial 55.6 (*)    All other components within normal limits  D-DIMER, QUANTITATIVE - Abnormal; Notable for the following:    D-Dimer, Quant 1.06 (*)    All other components within normal limits  CARDIAC PANEL(CRET KIN+CKTOT+MB+TROPI)  LACTIC ACID, PLASMA  CARDIAC PANEL(CRET  KIN+CKTOT+MB+TROPI)  LACTIC ACID, PLASMA  CULTURE, BLOOD (ROUTINE X 2)  CULTURE, BLOOD (ROUTINE X 2)  URINE CULTURE  CARDIAC PANEL(CRET KIN+CKTOT+MB+TROPI)   Ct Abdomen Pelvis W Contrast  06/20/2011  *RADIOLOGY REPORT*  Clinical Data: Lower abdominal and pelvic pain and pressure. Leukocytosis.  CT ABDOMEN AND PELVIS WITH CONTRAST  Technique:  Multidetector CT imaging of the abdomen and pelvis was performed following the standard protocol during bolus administration of intravenous contrast.  Contrast: OMNIPAQUE IOHEXOL 300 MG/ML IV SOLN  Comparison: 08/13/2004  Findings: Increased cardiomegaly is seen since previous study. Diffuse bibasilar air space opacity is seen as well as a tiny right pleural effusion.  These findings are consistent with congestive heart failure.  The liver, gallbladder, pancreas, spleen, and adrenal glands are normal in appearance.  IVC filter is seen in place.  Renal cysts and parenchymal scarring again seen bilaterally.  Right extrarenal pelvis noted, without evidence of caliectasis or ureterectasis. Mild diffuse bladder wall thickening and mucosal enhancement is again noted, which may be seen with neurogenic bladder, cystitis, or chronic bladder outlet obstruction.  Previous hysterectomy noted.  Severe diverticulosis seen involving the descending and sigmoid colon, however there is no evidence of diverticulitis.  No other inflammatory process or abnormal fluid collections are identified. No soft tissue masses or lymphadenopathy identified within the abdomen or pelvis.  IMPRESSION:  1.  Increased cardiomegaly, bibasilar airspace disease, and tiny right pleural effusion, consistent with congestive heart failure. 2.  No acute findings within the abdomen or pelvis. 3.  Mild diffuse bladder wall thickening and enhancement. Differential diagnosis includes neurogenic bladder, cystitis, or chronic bladder outlet obstruction. 3.  Severe sigmoid diverticulosis.  No radiographic evidence  of diverticulitis.  Original Report Authenticated By: Danae Orleans, M.D.   Dg Abd Acute W/chest  06/20/2011  *RADIOLOGY REPORT*  Clinical Data: Left mid abdominal pain, discomfort  ACUTE ABDOMEN SERIES (ABDOMEN 2 VIEW & CHEST 1 VIEW)  Comparison: 06/10/2010.  Findings:Cardiac leads overlie the chest.  Mitral annular calcification accounts for coarse radiodensities over the cardiac silhouettes. Lung volumes are low with crowding of the bronchovascular markings.  Allowing for this, no focal pulmonary opacity is seen.  IVC filter in place.  No free air beneath the diaphragms or on the decubitus projection.  Splenic arterial calcification noted without definite calcified aneurysm.  Normal bowel gas pattern.  No acute osseous abnormality.  IMPRESSION: Normal bowel gas pattern.  Low lung volumes. If the patient's symptoms continue, consider PA and lateral chest radiographs obtained at full inspiration when the patient is clinically able.  Original Report Authenticated By: Harrel Lemon, M.D.     1. Pyelonephritis   2. Vaginal candidiasis   3. Hyperglycemia       MDM  Lower abdominal pain with vaginal itching and pain with urinary symptoms.  Decreased PO intake and generalized weakness.   Infected urinalysis with yeast.  Pelvic exam performed by [facility].  See her separate note.  Patient be admitted with generalized weakness, pyelonephritis, hyperglycemia.  Episode of hypoxia and tachypnea in ED, likely 2/2 CHF.  Crackles on exam.  IVF stopped, lasix given with improvement.    Date: 06/20/2011  Rate: 106  Rhythm: sinus tachycardia  QRS Axis: normal  Intervals: normal  ST/T Wave abnormalities: ST depressions inferiorly and ST depressions laterally  Conduction Disutrbances:none  Narrative Interpretation: Inferior lateral ST depressions likely rate related  Old EKG Reviewed: changes noted       Glynn Octave, MD 06/20/11 [phone]

## 2011-06-20 NOTE — H&P (Signed)
Report called to Annie Dulong , RN. 

## 2011-06-20 NOTE — Progress Notes (Signed)
ANTIBIOTIC CONSULT NOTE - INITIAL  Pharmacy Consult for: Vancomycin/Zosyn/Fluconazole Indication: presumed PNA and pyelonephritis   Allergies  Allergen Reactions  . Avelox (Moxifloxacin Hcl In Nacl)   . Coumadin Other (See Comments)    Causes severe bleeding  . Proton Pump Inhibitors     Patient Measurements: Height: 5\' 3"  (160 cm) Weight: 140 lb (63.504 kg) IBW/kg (Calculated) : 52.4    Vital Signs: Temp: 97.6 F (36.4 C) (02/02 1118) Temp src: Oral (02/02 1118) BP: 150/62 mmHg (02/02 1504) Pulse Rate: 103  (02/02 1120) Intake/Output from previous day:   Intake/Output from this shift:    Labs:  Basename 06/20/11 1000  WBC 13.1*  HGB 9.3*  PLT PLATELET CLUMPS NOTED ON SMEAR, COUNT APPEARS ADEQUATE  LABCREA --  CREATININE 1.38*   Estimated Creatinine Clearance: 27.7 ml/min (by C-G formula based on Cr of 1.38). No results found for this basename: VANCOTROUGH:2,VANCOPEAK:2,VANCORANDOM:2,GENTTROUGH:2,GENTPEAK:2,GENTRANDOM:2,TOBRATROUGH:2,TOBRAPEAK:2,TOBRARND:2,AMIKACINPEAK:2,AMIKACINTROU:2,AMIKACIN:2, in the last 72 hours   Microbiology: Recent Results (from the past 720 hour(s))  WET PREP, GENITAL     Status: Abnormal   Collection Time   06/20/11 10:30 AM      Component Value Range Status Comment   Yeast Wet Prep HPF POC FEW (*) NONE SEEN  Final    Trich, Wet Prep NONE SEEN  NONE SEEN  Final    Clue Cells Wet Prep HPF POC FEW (*) NONE SEEN  Final    WBC, Wet Prep HPF POC MODERATE (*) NONE SEEN  Final     Medical History: Past Medical History  Diagnosis Date  . Diabetes mellitus   . Hypertension   . Vascular disease     Medications:  Scheduled:    . sodium chloride   Intravenous Once  . cefTRIAXone (ROCEPHIN)  IV  1 g Intravenous Once  . fluconazole  100 mg Oral Daily  . fluconazole  150 mg Oral Once  . furosemide  60 mg Intravenous Once  . piperacillin-tazobactam  3.375 g Intravenous Once  . piperacillin-tazobactam (ZOSYN)  IV  3.375 g Intravenous Q8H   . Tamsulosin HCl  0.4 mg Oral QPC breakfast  . vancomycin  750 mg Intravenous Q24H  . DISCONTD: cefTRIAXone (ROCEPHIN)  IV  1 g Intravenous Q24H   Infusions:   PRN: iohexol Assessment:  78 yo F with presumed PNA and pyelonephritis to start empiric vancomycin and zosyn, and fluconazole for urinary yeast infection  WBC elevated 13.1  Scr elevated 1.38 (CrCl 27 ml/min)  Blood and Urine cultures pending  Goal of Therapy:  Vancomycin trough 15-20  Zosyn and fluconazole per renal function  Plan:  1.) Vancomycin 750 mg IV q24h  2.) Zosyn 3.375 gm IV q8h 3.) Fluconazole 100 mg PO q24h  4.) Monitor renal function 5.) Vancomycin troughs when appropriate 6.) Follow blood and urine cultures  Brandii Lakey, Loma Messing PharmD 3:20 PM 06/20/2011

## 2011-06-20 NOTE — Progress Notes (Signed)
Pt dgt called out that pt is having tremors/chills. Pt was given tylenol one hour ago. Pt's vitals assessed.  Temp 97.9.CBG 461. Awaiting Insulin verification from pharmacist ( spoke with her now and conveyed blood sugar and need for insulin). Covered with warm blanket chills subsided. Will call MD.

## 2011-06-20 NOTE — ED Notes (Signed)
Patient @ [facility]

## 2011-06-20 NOTE — ED Notes (Signed)
Pelvic cart at bedside. Room and patient ready for exam

## 2011-06-21 LAB — CBC
HCT: 28.6 % — ABNORMAL LOW (ref 36.0–46.0)
Hemoglobin: 9 g/dL — ABNORMAL LOW (ref 12.0–15.0)
MCH: 26.9 pg (ref 26.0–34.0)
MCHC: 31.5 g/dL (ref 30.0–36.0)

## 2011-06-21 LAB — CARDIAC PANEL(CRET KIN+CKTOT+MB+TROPI)
Relative Index: INVALID (ref 0.0–2.5)
Troponin I: 0.8 ng/mL (ref <0.30)

## 2011-06-21 LAB — GLUCOSE, CAPILLARY
Glucose-Capillary: 461 mg/dL — ABNORMAL HIGH (ref 70–99)
Glucose-Capillary: 318 mg/dL — ABNORMAL HIGH (ref 70–99)
Glucose-Capillary: 281 mg/dL — ABNORMAL HIGH (ref 70–99)
Glucose-Capillary: 342 mg/dL — ABNORMAL HIGH (ref 70–99)
Glucose-Capillary: 332 mg/dL — ABNORMAL HIGH (ref 70–99)

## 2011-06-21 LAB — URINE CULTURE: Culture  Setup Time: 201302021824

## 2011-06-21 LAB — TSH: TSH: 2.417 u[IU]/mL (ref 0.350–4.500)

## 2011-06-21 MED ORDER — INSULIN ASPART 100 UNIT/ML ~~LOC~~ SOLN
0.0000 [IU] | SUBCUTANEOUS | Status: DC
  Administered 2011-06-21: 15 [IU] via SUBCUTANEOUS
  Administered 2011-06-21: 20 [IU] via SUBCUTANEOUS
  Administered 2011-06-21: 15 [IU] via SUBCUTANEOUS
  Administered 2011-06-21: 7 [IU] via SUBCUTANEOUS
  Administered 2011-06-21: 18 [IU] via SUBCUTANEOUS
  Administered 2011-06-22: 4 [IU] via SUBCUTANEOUS
  Administered 2011-06-22: 3 [IU] via SUBCUTANEOUS
  Administered 2011-06-22 (×2): 4 [IU] via SUBCUTANEOUS
  Administered 2011-06-22: 11 [IU] via SUBCUTANEOUS
  Administered 2011-06-23: 7 [IU] via SUBCUTANEOUS
  Administered 2011-06-23: 3 [IU] via SUBCUTANEOUS
  Administered 2011-06-23: 7 [IU] via SUBCUTANEOUS
  Administered 2011-06-23 – 2011-06-24 (×2): 4 [IU] via SUBCUTANEOUS
  Administered 2011-06-24: 7 [IU] via SUBCUTANEOUS
  Administered 2011-06-24: 11 [IU] via SUBCUTANEOUS
  Administered 2011-06-24: 7 [IU] via SUBCUTANEOUS
  Administered 2011-06-24: 4 [IU] via SUBCUTANEOUS
  Administered 2011-06-24: 7 [IU] via SUBCUTANEOUS
  Administered 2011-06-25: 3 [IU] via SUBCUTANEOUS
  Administered 2011-06-25 (×2): 20 [IU] via SUBCUTANEOUS
  Administered 2011-06-25: 4 [IU] via SUBCUTANEOUS
  Administered 2011-06-25: 3 [IU] via SUBCUTANEOUS
  Administered 2011-06-26 (×2): 7 [IU] via SUBCUTANEOUS
  Administered 2011-06-26: 3 [IU] via SUBCUTANEOUS
  Administered 2011-06-26: 7 [IU] via SUBCUTANEOUS
  Administered 2011-06-27 (×4): 4 [IU] via SUBCUTANEOUS
  Administered 2011-06-27: 7 [IU] via SUBCUTANEOUS
  Administered 2011-06-27: 4 [IU] via SUBCUTANEOUS
  Administered 2011-06-28: 3 [IU] via SUBCUTANEOUS
  Administered 2011-06-28: 7 [IU] via SUBCUTANEOUS
  Administered 2011-06-28: 4 [IU] via SUBCUTANEOUS
  Administered 2011-06-28: 7 [IU] via SUBCUTANEOUS
  Administered 2011-06-28 – 2011-06-29 (×2): 3 [IU] via SUBCUTANEOUS
  Administered 2011-06-29 (×2): 7 [IU] via SUBCUTANEOUS
  Filled 2011-06-21 (×2): qty 3

## 2011-06-21 MED ORDER — SODIUM CHLORIDE 0.9 % IV SOLN
INTRAVENOUS | Status: DC
  Administered 2011-06-22: 01:00:00 via INTRAVENOUS

## 2011-06-21 MED ORDER — ENOXAPARIN SODIUM 30 MG/0.3ML ~~LOC~~ SOLN
30.0000 mg | SUBCUTANEOUS | Status: DC
  Administered 2011-06-21 – 2011-06-24 (×4): 30 mg via SUBCUTANEOUS
  Filled 2011-06-21 (×5): qty 0.3

## 2011-06-21 MED ORDER — SODIUM POLYSTYRENE SULFONATE 15 GM/60ML PO SUSP
30.0000 g | Freq: Once | ORAL | Status: DC
  Filled 2011-06-21: qty 120

## 2011-06-21 MED ORDER — INSULIN GLARGINE 100 UNIT/ML ~~LOC~~ SOLN
25.0000 [IU] | Freq: Once | SUBCUTANEOUS | Status: AC
  Administered 2011-06-21: 25 [IU] via SUBCUTANEOUS

## 2011-06-21 MED ORDER — ALBUMIN HUMAN 5 % IV SOLN
12.5000 g | Freq: Once | INTRAVENOUS | Status: AC
  Administered 2011-06-22: 12.5 g via INTRAVENOUS
  Filled 2011-06-21: qty 250

## 2011-06-21 MED ORDER — SODIUM POLYSTYRENE SULFONATE 15 GM/60ML PO SUSP
30.0000 g | Freq: Once | ORAL | Status: AC
  Administered 2011-06-21: 30 g via ORAL
  Filled 2011-06-21: qty 120

## 2011-06-21 MED ORDER — FUROSEMIDE 10 MG/ML IJ SOLN
40.0000 mg | Freq: Once | INTRAMUSCULAR | Status: AC
  Administered 2011-06-22: 40 mg via INTRAVENOUS
  Filled 2011-06-21: qty 4

## 2011-06-21 MED ORDER — INSULIN ISOPHANE & REGULAR (50-50) 100 UNIT/ML ~~LOC~~ SUSP: 50.0000 [IU] | Freq: Two times a day (BID) | SUBCUTANEOUS | Status: DC

## 2011-06-21 MED ORDER — INSULIN ISOPHANE & REGULAR (50-50) 100 UNIT/ML ~~LOC~~ SUSP
50.0000 [IU] | Freq: Two times a day (BID) | SUBCUTANEOUS | Status: DC
  Administered 2011-06-21: 50 [IU] via SUBCUTANEOUS

## 2011-06-21 MED ORDER — SODIUM CHLORIDE 0.9 % IV SOLN
1.0000 g | Freq: Once | INTRAVENOUS | Status: AC
  Administered 2011-06-21: 1 g via INTRAVENOUS
  Filled 2011-06-21: qty 10

## 2011-06-21 NOTE — Progress Notes (Signed)
[facility] hospitalist progress note. Chief complaint. Mildly elevated troponin. This 78 year old female admitted with complaints of abdominal pain and pressure and associated anginal pain. Also complained of dyspnea on admission. She has a significant history of congestive heart failure with an ejection fraction of 35-40% per echocardiogram in 2011. She also has a prior history of myocardial infarction and thus coronary artery disease. I reviewed the old hospital records and see that she is followed by cardiology Dr. Algie Coffer. There was concern or dyspnea might be related to PE but a nuclear medicine perfusion scan was negative. She had a CT scan of the abdomen and pelvis which indicated increased cardiomegaly with bibasilar airspace disease and tiny right pleural effusion consistent with congestive heart failure. Her latest set of cardiac enzymes shows CK 80, MB 3.6, troponin elevated mildly 0.55. Her pro BNP on admission noted elevated 6684. I saw the patient at the bedside. A repeat EKG was done and does not look significantly changed from her baseline. Patient has no complaints of chest pain or dyspnea currently. She also had a metabolic panel that has resulted in indicates potassium elevated at 5.7. Vital signs. Temperature 98.4, pulse 88, respirations 20, blood pressure 116/71. O2 sats 94%. General appearance. Frail elderly female in no distress. Alert and cooperative. Cardiac. Rhythm regular in rate mildly tachycardic at 105 apically. Mild jugular venous distention. No peripheral edema. Lungs. Some scattered crackles in the bases particularly on the right side. No distress. Stable O2 sats. Abdomen. Soft with positive bowel sounds. Mild diffuse pain with palpation but no guarding or rebound tenderness. Impression/plan Problem #1 mildly elevated troponin. I did discuss the case with Dr. Algie Coffer cardiologist. He feels the mild elevation of troponin is likely due to congestive heart failure. He agrees with  continuing cycle cardiac enzymes and continuing her on medical management which includes aspirin, beta blocker, digoxin. He will see the patient in the a.m. I will repeat a 12-lead EKG in the a.m. as well. Problem #2 hyperkalemia. Latest serum potassium resulted at 5.7. I will give the patient 30 g of Kayexalate by mouth for one dose. Also one amp of calcium gluconate IV. Will repeat a serum potassium level at 04:00 and staff to call this result. Problem #3 diabetes. Patient ordered her home dosing of 50/50 NPH/regular insulin. Unfortunately the pharmacy does not stock this type of insulin. Nursing discuss this with the family who will bring in the patient's home insulin for administration. In the short run I've given her a one-time dose of Lantus 15 units and placed her on moderate sliding scale NovoLog coverage with every 4 hour glucose checks. The home insulin can be started when felt appropriate by the rounding physician and when the family brings this in for use.

## 2011-06-21 NOTE — Consult Note (Signed)
Subjective:     [name] is a 78 y.o. female who has abnormal cardiac enzymes, shortness of breath, elevated pro-BNP of 6684 and elevated blood sugars in 400-500 mg. Range, Hgb A1C of 9.3 and pyelonephritis.  This consultation was requested by [facility] hospitalist.   She describes her symptoms worsening over 2 weeks. Previous treatments/evaluations include: ASA, beta blocker and HMG-CoA reductase inhibitor. Cardiac risk factors: advanced age (older than 69 for men, 30 for women), diabetes mellitus, dyslipidemia and sedentary lifestyle.  The following portions of the patient's history were reviewed and updated as appropriate: allergies, current medications, past family history, past medical history, past social history, past surgical history and problem list.  Review of Systems .  Diabetes mellitus    .  Hypertension    .  Vascular disease    CHF EF OF 35-40%,LAST ECHO IN 2011  1. Pulmonary embolus on June 10, 2007.now has an IVC filter in place  2. History of cerebrovascular accident.  3. Insulin-dependent diabetic.  4. Myocardial infarction.  5. Coronary artery disease.  6. Lower GI bleed in March 2006, that Dr. Evette Cristal believed was secondary  to diverticulosis.  7. The patient tells me she is blind.  8. She had a colonoscopy done in 1993, that showed internal  hemorrhoids and diverticulosis. She had another colonoscopy done  in 2001, that showed internal and external hemorrhoids with  significant left diverticulosis with fresh blood in the descending  colon.   Objective:     Physical Exam Constitutional: She is oriented to person and place. She appears well-developed and averagely-nourished. No distress.  Head: Normocephalic and atraumatic.  Mouth/Throat: Oropharynx is clear and moist. No oropharyngeal exudate.  Eyes: Brown eyes, Conjunctivae pale pink. EOM are normal. Pupils are equal, round, and reactive to light.  Neck: Normal range of motion. Neck supple.  Cardiovascular:  Normal rate, regular rhythm and normal heart sounds.  Pulmonary/Chest: No respiratory distress. She has no wheezes. Fine crackles both lower lobes. Abdominal: Soft. There is mild suprapubic tenderness. There is no rebound and no guarding.  Musculoskeletal: Normal range of motion. She exhibits no edema and no tenderness.  Neurological: No cranial nerve deficit. Moves all 4 extremities.  Skin: Skin is warm.   Cardiographics ECG: Not available . Echocardiogram: not done  Imaging Chest x-ray: pending CT abdomen and pelvis 1. Increased cardiomegaly, bibasilar airspace disease, and tiny  right pleural effusion, consistent with congestive heart failure.  2. No acute findings within the abdomen or pelvis.  3. Mild diffuse bladder wall thickening and enhancement.  Differential diagnosis includes neurogenic bladder, cystitis, or  chronic bladder outlet obstruction.  3. Severe sigmoid diverticulosis. No radiographic evidence of  diverticulitis.  VQ scan negative for pulmonary embolism.  Lab Review  Lab Results  Component Value Date   NA 125* 06/20/2011   K 4.4 06/21/2011   CL 92* 06/20/2011   CO2 20 06/20/2011   BUN 44* 06/20/2011   CREATININE 1.81* 06/20/2011   GLUCOSE 478* 06/20/2011   CALCIUM 8.5 06/20/2011   Lab Results  Component Value Date   CKTOTAL 79 06/21/2011   CKMB 4.0 06/21/2011   TROPONINI 0.80* 06/21/2011       Assessment:     CHF , Acute on chronic, left heart systolic failure Pyelonephritis Diabetes Mellitus Hypertension Pseudohyponatremia Renal insufficiency  Plan:    Agree with antibiotic, holding ACE, avoid heparin for h/o gastrointestinal bleed

## 2011-06-21 NOTE — Progress Notes (Signed)
Dgt at bedside assisting pt to eat breakfast. Nurse and NT unaware that pt already eating. Last CBG at 3:47am >400. Pt also needs carb modified diet. Diabetes and when to check bloodsugars is an area where pt and dgt need edcuations. CBG now 318.

## 2011-06-21 NOTE — Progress Notes (Addendum)
Subjective: According to the daughter the patient is much better She still has elevated CBGs Cardiology Dr. Algie Coffer came in to see the patient briefly this morning  Objective: Vital signs in last 24 hours: Filed Vitals:   06/20/11 1725 06/20/11 1900 06/20/11 2058 06/21/11 0612  BP: 134/79 151/82 116/71 100/64  Pulse: 114 109 88 82  Temp: 99.3 F (37.4 C) 97.9 F (36.6 C) 98.4 F (36.9 C) 98.1 F (36.7 C)  TempSrc: Oral Oral Axillary Oral  Resp: 36 32 46 22  Height: 5\' 3"  (1.6 m)     Weight: 65.318 kg (144 lb)     SpO2: 94% 89% 94% 99%    Intake/Output Summary (Last 24 hours) at 06/21/11 1230 Last data filed at 06/21/11 0754  Gross per 24 hour  Intake    940 ml  Output      0 ml  Net    940 ml    Weight change:   Constitutional: She is oriented to person, place, and time. She appears well-developed and well-nourished. No distress.  HENT:  Head: Normocephalic and atraumatic.  Mouth/Throat: Oropharynx is clear and moist. No oropharyngeal exudate.  Eyes: Conjunctivae and EOM are normal. Pupils are equal, round, and reactive to light.  Neck: Normal range of motion. Neck supple.  Cardiovascular: Normal rate, regular rhythm and normal heart sounds.  Pulmonary/Chest: Effort normal and breath sounds normal. No respiratory distress. She has no wheezes.  Abdominal: Soft. There is tenderness. There is no rebound and no guarding.  Mild suprapubic tenderness  Musculoskeletal: Normal range of motion. She exhibits no edema and no tenderness.  Neurological: She is alert and oriented to person, place, and time. No cranial nerve deficit.  5/5 strength throughout, no focal deficits.  Skin: Skin is warm.    Lab Results: Results for orders placed during the hospital encounter of 06/20/11 (from the past 24 hour(s))  BLOOD GAS, ARTERIAL     Status: Abnormal   Collection Time   06/20/11  1:20 PM      Component Value Range   O2 Content 3.0     Delivery systems NASAL CANNULA     pH,  Arterial 7.438 (*) 7.350 - 7.400    pCO2 arterial 34.9 (*) 35.0 - 45.0 (mmHg)   pO2, Arterial 55.6 (*) 80.0 - 100.0 (mmHg)   Bicarbonate 23.2  20.0 - 24.0 (mEq/L)   TCO2 21.5  0 - 100 (mmol/L)   Acid-base deficit 0.2  0.0 - 2.0 (mmol/L)   O2 Saturation 88.7     Patient temperature 98.6     Collection site RIGHT RADIAL     Drawn by [phone]     Sample type ARTERIAL DRAW     Allens test (pass/fail) PASS  PASS   D-DIMER, QUANTITATIVE     Status: Abnormal   Collection Time   06/20/11  2:30 PM      Component Value Range   D-Dimer, Quant 1.06 (*) 0.00 - 0.48 (ug/mL-FEU)  CARDIAC PANEL(CRET KIN+CKTOT+MB+TROPI)     Status: Normal   Collection Time   06/20/11  3:10 PM      Component Value Range   Total CK 88  7 - 177 (U/L)   CK, MB 3.1  0.3 - 4.0 (ng/mL)   Troponin I <0.30  <0.30 (ng/mL)   Relative Index RELATIVE INDEX IS INVALID  0.0 - 2.5   GLUCOSE, CAPILLARY     Status: Abnormal   Collection Time   06/20/11  7:00 PM  Component Value Range   Glucose-Capillary 461 (*) 70 - 99 (mg/dL)   Comment 1 Notify RN     Comment 2 Documented in Chart    GLUCOSE, CAPILLARY     Status: Abnormal   Collection Time   06/20/11  8:05 PM      Component Value Range   Glucose-Capillary 536 (*) 70 - 99 (mg/dL)  CARDIAC PANEL(CRET KIN+CKTOT+MB+TROPI)     Status: Abnormal   Collection Time   06/20/11 10:50 PM      Component Value Range   Total CK 80  7 - 177 (U/L)   CK, MB 3.6  0.3 - 4.0 (ng/mL)   Troponin I 0.55 (*) <0.30 (ng/mL)   Relative Index RELATIVE INDEX IS INVALID  0.0 - 2.5   COMPREHENSIVE METABOLIC PANEL     Status: Abnormal   Collection Time   06/20/11 10:50 PM      Component Value Range   Sodium 125 (*) 135 - 145 (mEq/L)   Potassium 5.7 (*) 3.5 - 5.1 (mEq/L)   Chloride 92 (*) 96 - 112 (mEq/L)   CO2 20  19 - 32 (mEq/L)   Glucose, Bld 478 (*) 70 - 99 (mg/dL)   BUN 44 (*) 6 - 23 (mg/dL)   Creatinine, Ser 9.14 (*) 0.50 - 1.10 (mg/dL)   Calcium 8.5  8.4 - 78.2 (mg/dL)   Total Protein 6.8  6.0 -  8.3 (g/dL)   Albumin 2.5 (*) 3.5 - 5.2 (g/dL)   AST 20  0 - 37 (U/L)   ALT 22  0 - 35 (U/L)   Alkaline Phosphatase 73  39 - 117 (U/L)   Total Bilirubin 0.6  0.3 - 1.2 (mg/dL)   GFR calc non Af Amer 25 (*) >90 (mL/min)   GFR calc Af Amer 29 (*) >90 (mL/min)  MAGNESIUM     Status: Normal   Collection Time   06/20/11 10:50 PM      Component Value Range   Magnesium 2.2  1.5 - 2.5 (mg/dL)  HEMOGLOBIN N5A     Status: Abnormal   Collection Time   06/20/11 10:50 PM      Component Value Range   Hemoglobin A1C 9.3 (*) <5.7 (%)   Mean Plasma Glucose 220 (*) <117 (mg/dL)  LACTIC ACID, PLASMA     Status: Abnormal   Collection Time   06/20/11 10:50 PM      Component Value Range   Lactic Acid, Venous 3.6 (*) 0.5 - 2.2 (mmol/L)  LIPASE, BLOOD     Status: Abnormal   Collection Time   06/20/11 10:50 PM      Component Value Range   Lipase 67 (*) 11 - 59 (U/L)  GLUCOSE, CAPILLARY     Status: Abnormal   Collection Time   06/21/11 12:21 AM      Component Value Range   Glucose-Capillary 454 (*) 70 - 99 (mg/dL)  GLUCOSE, CAPILLARY     Status: Abnormal   Collection Time   06/21/11  3:47 AM      Component Value Range   Glucose-Capillary 461 (*) 70 - 99 (mg/dL)  POTASSIUM     Status: Normal   Collection Time   06/21/11  7:10 AM      Component Value Range   Potassium 4.4  3.5 - 5.1 (mEq/L)  CBC     Status: Abnormal   Collection Time   06/21/11  7:11 AM      Component Value Range   WBC 8.6  4.0 - 10.5 (K/uL)   RBC 3.34 (*) 3.87 - 5.11 (MIL/uL)   Hemoglobin 9.0 (*) 12.0 - 15.0 (g/dL)   HCT 16.1 (*) 09.6 - 46.0 (%)   MCV 85.6  78.0 - 100.0 (fL)   MCH 26.9  26.0 - 34.0 (pg)   MCHC 31.5  30.0 - 36.0 (g/dL)   RDW 04.5 (*) 40.9 - 15.5 (%)   Platelets 121 (*) 150 - 400 (K/uL)  CARDIAC PANEL(CRET KIN+CKTOT+MB+TROPI)     Status: Abnormal   Collection Time   06/21/11  7:11 AM      Component Value Range   Total CK 79  7 - 177 (U/L)   CK, MB 4.0  0.3 - 4.0 (ng/mL)   Troponin I 0.80 (*) <0.30 (ng/mL)   Relative  Index RELATIVE INDEX IS INVALID  0.0 - 2.5      Micro: Recent Results (from the past 240 hour(s))  WET PREP, GENITAL     Status: Abnormal   Collection Time   06/20/11 10:30 AM      Component Value Range Status Comment   Yeast Wet Prep HPF POC FEW (*) NONE SEEN  Final    Trich, Wet Prep NONE SEEN  NONE SEEN  Final    Clue Cells Wet Prep HPF POC FEW (*) NONE SEEN  Final    WBC, Wet Prep HPF POC MODERATE (*) NONE SEEN  Final   CULTURE, BLOOD (ROUTINE X 2)     Status: Normal (Preliminary result)   Collection Time   06/20/11 12:20 PM      Component Value Range Status Comment   Specimen Description BLOOD LEFT HAND   Final    Special Requests BOTTLES DRAWN AEROBIC AND ANAEROBIC [facility] EACH   Final    Culture  Setup Time [id number]   Final    Culture     Final    Value:        BLOOD CULTURE RECEIVED NO GROWTH TO DATE CULTURE WILL BE HELD FOR 5 DAYS BEFORE ISSUING A FINAL NEGATIVE REPORT   Report Status PENDING   Incomplete   CULTURE, BLOOD (ROUTINE X 2)     Status: Normal (Preliminary result)   Collection Time   06/20/11 12:30 PM      Component Value Range Status Comment   Specimen Description BLOOD LEFT ARM   Final    Special Requests BOTTLES DRAWN AEROBIC AND ANAEROBIC [facility] EACH   Final    Culture  Setup Time [id number]   Final    Culture     Final    Value:        BLOOD CULTURE RECEIVED NO GROWTH TO DATE CULTURE WILL BE HELD FOR 5 DAYS BEFORE ISSUING A FINAL NEGATIVE REPORT   Report Status PENDING   Incomplete     Studies/Results: Ct Abdomen Pelvis W Contrast  06/20/2011  *RADIOLOGY REPORT*  Clinical Data: Lower abdominal and pelvic pain and pressure. Leukocytosis.  CT ABDOMEN AND PELVIS WITH CONTRAST  Technique:  Multidetector CT imaging of the abdomen and pelvis was performed following the standard protocol during bolus administration of intravenous contrast.  Contrast: OMNIPAQUE IOHEXOL 300 MG/ML IV SOLN  Comparison: 08/13/2004  Findings: Increased cardiomegaly is seen since  previous study. Diffuse bibasilar air space opacity is seen as well as a tiny right pleural effusion.  These findings are consistent with congestive heart failure.  The liver, gallbladder, pancreas, spleen, and adrenal glands are normal in appearance.  IVC filter is seen in place.  Renal cysts and parenchymal scarring again seen bilaterally.  Right extrarenal pelvis noted, without evidence of caliectasis or ureterectasis. Mild diffuse bladder wall thickening and mucosal enhancement is again noted, which may be seen with neurogenic bladder, cystitis, or chronic bladder outlet obstruction.  Previous hysterectomy noted.  Severe diverticulosis seen involving the descending and sigmoid colon, however there is no evidence of diverticulitis.  No other inflammatory process or abnormal fluid collections are identified. No soft tissue masses or lymphadenopathy identified within the abdomen or pelvis.  IMPRESSION:  1.  Increased cardiomegaly, bibasilar airspace disease, and tiny right pleural effusion, consistent with congestive heart failure. 2.  No acute findings within the abdomen or pelvis. 3.  Mild diffuse bladder wall thickening and enhancement. Differential diagnosis includes neurogenic bladder, cystitis, or chronic bladder outlet obstruction. 3.  Severe sigmoid diverticulosis.  No radiographic evidence of diverticulitis.  Original Report Authenticated By: Danae Orleans, M.D.   Nm Pulmonary Per & Vent  06/20/2011  *RADIOLOGY REPORT*  Clinical Data: Congestive heart failure.  Pulmonary embolus and January 2009.  IVC filter in place.  Shortness of breath.  Elevated D-dimer.  NM PULMONARY VENTILATION AND PERFUSION SCAN  Radiopharmaceutical: 8.6 mCi xenon 133 inhaled.  4.3 mCi technetium 44m MAA IV.  Comparison: Chest x-ray dated 06/20/2011  Findings: The patient has chronic elevation of the right hemidiaphragm.  The ventilation study is otherwise normal.  The perfusion study is normal.  IMPRESSION: Normal ventilation  perfusion lung scan.  No evidence of pulmonary embolism.  Original Report Authenticated By: Gwynn Burly, M.D.   Dg Abd Acute W/chest  06/20/2011  *RADIOLOGY REPORT*  Clinical Data: Left mid abdominal pain, discomfort  ACUTE ABDOMEN SERIES (ABDOMEN 2 VIEW & CHEST 1 VIEW)  Comparison: 06/10/2010.  Findings:Cardiac leads overlie the chest.  Mitral annular calcification accounts for coarse radiodensities over the cardiac silhouettes. Lung volumes are low with crowding of the bronchovascular markings.  Allowing for this, no focal pulmonary opacity is seen.  IVC filter in place.  No free air beneath the diaphragms or on the decubitus projection.  Splenic arterial calcification noted without definite calcified aneurysm.  Normal bowel gas pattern.  No acute osseous abnormality.  IMPRESSION: Normal bowel gas pattern.  Low lung volumes. If the patient's symptoms continue, consider PA and lateral chest radiographs obtained at full inspiration when the patient is clinically able.  Original Report Authenticated By: Harrel Lemon, M.D.    Medications: Scheduled Meds:   . ALPRAZolam  0.25 mg Oral QHS  . aspirin EC  81 mg Oral QPM  . calcium gluconate  1 g Intravenous Once  . digoxin  125 mcg Oral QODAY  . enoxaparin (LOVENOX) injection  30 mg Subcutaneous Q24H  . fluconazole  100 mg Oral Daily  . fluticasone  1 spray Each Nare Daily  . furosemide  60 mg Intravenous Once  . insulin aspart  0-20 Units Subcutaneous Q4H  . insulin aspart  20 Units Subcutaneous Once  . insulin glargine  15 Units Subcutaneous Once  . insulin glargine  25 Units Subcutaneous Once  . insulin NPH-insulin regular  50 Units Subcutaneous BID AC  . metoprolol tartrate  12.5 mg Oral BID  . piperacillin-tazobactam  3.375 g Intravenous Once  . piperacillin-tazobactam (ZOSYN)  IV  3.375 g Intravenous Q8H  . sodium polystyrene  30 g Oral Once  . Tamsulosin HCl  0.4 mg Oral QPC breakfast  . vancomycin  750 mg Intravenous Q24H  .  DISCONTD: cefTRIAXone (ROCEPHIN)  IV  1 g Intravenous Q24H  . DISCONTD: enoxaparin  40 mg Subcutaneous Q24H  . DISCONTD: insulin aspart  0-15 Units Subcutaneous Q4H  . DISCONTD: insulin NPH-insulin regular  50 Units Subcutaneous BID AC  . DISCONTD: insulin NPH-insulin regular  50 Units Subcutaneous BID AC  . DISCONTD: sodium polystyrene  30 g Oral Once   Continuous Infusions:  PRN Meds:.acetaminophen, acetaminophen, albuterol, albuterol-ipratropium, alum & mag hydroxide-simeth, iohexol, ondansetron (ZOFRAN) IV, ondansetron, technetium albumin aggregated, traMADol, xenon xe 133   Assessment: Active Problems:  Pyelonephritis, also has a pneumonia on CT abdomen and pelvis, initiated on vancomycin plus Zosyn yesterday, on fluconazole for candida in the urine, DC vancomycin, continue Zosyn and fluconazole   Diabetes mellitus started on Lantus, need to switch to insulin 50-50 and continue sliding scale insulin, hemoglobin A1c of 9.3   Hypertension continue metoprolol   Myocardial infarction. With abnormal troponin, cardiology has been consulted, 2-D echo is pending, this is likely secondary to ischemic cardiomyopathy in the setting of renal insufficiency,   Coronary artery disease continue aspirin, beta blocker  History of cerebrovascular accident. Continue aspirin  CHF exacerbation with an EF of 35-40%, repeat 2-D echo is pending, continue Lasix for diuresis cautious monitoring of her creatinine   Hyponatremia/hyperkalemia Patient received a dose of her insulin this morning repeat potassium is 4.4 Hyponatremia likely secondary to SIADH, initiated on fluid restriction   LOS: 1 day   [facility] 06/21/2011, 12:30 PM

## 2011-06-22 ENCOUNTER — Inpatient Hospital Stay (HOSPITAL_COMMUNITY): Payer: 59

## 2011-06-22 LAB — CLOSTRIDIUM DIFFICILE BY PCR: Toxigenic C. Difficile by PCR: NEGATIVE

## 2011-06-22 LAB — BASIC METABOLIC PANEL
Chloride: 85 mEq/L — ABNORMAL LOW (ref 96–112)
GFR calc Af Amer: 9 mL/min — ABNORMAL LOW (ref >90)
GFR calc non Af Amer: 8 mL/min — ABNORMAL LOW (ref >90)
Potassium: 4.8 mEq/L (ref 3.5–5.1)
Sodium: 120 mEq/L — ABNORMAL LOW (ref 135–145)

## 2011-06-22 LAB — GLUCOSE, CAPILLARY
Glucose-Capillary: 187 mg/dL — ABNORMAL HIGH (ref 70–99)
Glucose-Capillary: 194 mg/dL — ABNORMAL HIGH (ref 70–99)
Glucose-Capillary: 189 mg/dL — ABNORMAL HIGH (ref 70–99)
Glucose-Capillary: 137 mg/dL — ABNORMAL HIGH (ref 70–99)

## 2011-06-22 MED ORDER — SODIUM CHLORIDE 0.9 % IV SOLN: INTRAVENOUS | Status: DC

## 2011-06-22 MED ORDER — HYDROCODONE-ACETAMINOPHEN 5-325 MG PO TABS
1.0000 | ORAL_TABLET | Freq: Four times a day (QID) | ORAL | Status: DC | PRN
  Administered 2011-06-22 – 2011-06-30 (×3): 1 via ORAL
  Filled 2011-06-22 (×3): qty 1

## 2011-06-22 MED ORDER — FLUCONAZOLE 100 MG PO TABS
100.0000 mg | ORAL_TABLET | ORAL | Status: DC
  Administered 2011-06-23 – 2011-06-27 (×3): 100 mg via ORAL
  Filled 2011-06-22 (×3): qty 1

## 2011-06-22 MED ORDER — INSULIN LISPRO PROT & LISPRO (50-50 MIX) 100 UNIT/ML ~~LOC~~ SUSP
50.0000 [IU] | Freq: Two times a day (BID) | SUBCUTANEOUS | Status: DC
  Administered 2011-06-22: 50 [IU] via SUBCUTANEOUS

## 2011-06-22 MED ORDER — INSULIN LISPRO PROT & LISPRO (50-50 MIX) 100 UNIT/ML ~~LOC~~ SUSP
30.0000 [IU] | Freq: Two times a day (BID) | SUBCUTANEOUS | Status: DC
  Administered 2011-06-22: 30 [IU] via SUBCUTANEOUS

## 2011-06-22 MED ORDER — NITROGLYCERIN IN D5W 200-5 MCG/ML-% IV SOLN
10.0000 ug/min | INTRAVENOUS | Status: DC
  Administered 2011-06-22: 10 ug/min via INTRAVENOUS
  Filled 2011-06-22: qty 250

## 2011-06-22 MED ORDER — PIPERACILLIN-TAZOBACTAM IN DEX 2-0.25 GM/50ML IV SOLN
2.2500 g | Freq: Three times a day (TID) | INTRAVENOUS | Status: DC
  Administered 2011-06-22 – 2011-06-25 (×9): 2.25 g via INTRAVENOUS
  Filled 2011-06-22 (×12): qty 50

## 2011-06-22 MED ORDER — FUROSEMIDE 10 MG/ML IJ SOLN
80.0000 mg | Freq: Once | INTRAMUSCULAR | Status: AC
  Administered 2011-06-22: 80 mg via INTRAVENOUS
  Filled 2011-06-22: qty 8

## 2011-06-22 MED ORDER — SACCHAROMYCES BOULARDII 250 MG PO CAPS
250.0000 mg | ORAL_CAPSULE | Freq: Two times a day (BID) | ORAL | Status: DC
  Administered 2011-06-22 – 2011-06-30 (×17): 250 mg via ORAL
  Filled 2011-06-22 (×20): qty 1

## 2011-06-22 MED ORDER — SODIUM CHLORIDE 0.9 % IV BOLUS (SEPSIS): 250.0000 mL | Freq: Once | INTRAVENOUS | Status: DC

## 2011-06-22 MED ORDER — IPRATROPIUM-ALBUTEROL 18-103 MCG/ACT IN AERO
2.0000 | INHALATION_SPRAY | Freq: Four times a day (QID) | RESPIRATORY_TRACT | Status: DC
  Administered 2011-06-22 – 2011-06-24 (×6): 2 via RESPIRATORY_TRACT
  Filled 2011-06-22: qty 14.7

## 2011-06-22 NOTE — Progress Notes (Addendum)
Subjective: Complaining of sob Anuric, <50 cc in foley bag Denies CP   Objective: Vital signs in last 24 hours: Filed Vitals:   06/21/11 2122 06/22/11 0324 06/22/11 0532 06/22/11 1241  BP: 100/63 116/72 119/79   Pulse: 89 90 89   Temp: 98.8 F (37.1 C) 98.1 F (36.7 C) 97.7 F (36.5 C)   TempSrc: Oral Oral Oral   Resp: 22 20 22    Height:      Weight:      SpO2: 95% 98% 100% 95%    Intake/Output Summary (Last 24 hours) at 06/22/11 1421 Last data filed at 06/22/11 0900  Gross per 24 hour  Intake 1116.67 ml  Output     50 ml  Net 1066.67 ml    Weight change:   Constitutional: She is oriented to person, place, and time. She appears well-developed and well-nourished. No distress.  HENT:  Head: Normocephalic and atraumatic.  Mouth/Throat: Oropharynx is clear and moist. No oropharyngeal exudate.  Eyes: Conjunctivae and EOM are normal. Pupils are equal, round, and reactive to light.  Neck: Normal range of motion. Neck supple.  Cardiovascular: Normal rate, regular rhythm and normal heart sounds.  Pulmonary/Chest: Effort normal and breath sounds normal. No respiratory distress. Bibasilar crackles  Abdominal: Soft. There is tenderness. There is no rebound and no guarding.  Mild suprapubic tenderness  Musculoskeletal: Normal range of motion. She exhibits no edema and no tenderness.  Neurological: She is alert and oriented to person, place, and time. No cranial nerve deficit.  5/5 strength throughout, no focal deficits.  Skin: Skin is warm.      Lab Results: Results for orders placed during the hospital encounter of 06/20/11 (from the past 24 hour(s))  GLUCOSE, CAPILLARY     Status: Abnormal   Collection Time   06/21/11  4:42 PM      Component Value Range   Glucose-Capillary 342 (*) 70 - 99 (mg/dL)   Comment 1 Notify RN     Comment 2 Documented in Chart    GLUCOSE, CAPILLARY     Status: Abnormal   Collection Time   06/21/11  8:08 PM      Component Value Range   Glucose-Capillary 332 (*) 70 - 99 (mg/dL)  GLUCOSE, CAPILLARY     Status: Abnormal   Collection Time   06/22/11  1:00 AM      Component Value Range   Glucose-Capillary 187 (*) 70 - 99 (mg/dL)  GLUCOSE, CAPILLARY     Status: Abnormal   Collection Time   06/22/11  4:28 AM      Component Value Range   Glucose-Capillary 194 (*) 70 - 99 (mg/dL)  BASIC METABOLIC PANEL     Status: Abnormal   Collection Time   06/22/11  5:02 AM      Component Value Range   Sodium 120 (*) 135 - 145 (mEq/L)   Potassium 4.8  3.5 - 5.1 (mEq/L)   Chloride 85 (*) 96 - 112 (mEq/L)   CO2 20  19 - 32 (mEq/L)   Glucose, Bld 205 (*) 70 - 99 (mg/dL)   BUN 63 (*) 6 - 23 (mg/dL)   Creatinine, Ser 1.30 (*) 0.50 - 1.10 (mg/dL)   Calcium 7.8 (*) 8.4 - 10.5 (mg/dL)   GFR calc non Af Amer 8 (*) >90 (mL/min)   GFR calc Af Amer 9 (*) >90 (mL/min)  GLUCOSE, CAPILLARY     Status: Abnormal   Collection Time   06/22/11  7:46 AM  Component Value Range   Glucose-Capillary 189 (*) 70 - 99 (mg/dL)   Comment 1 Notify RN    GLUCOSE, CAPILLARY     Status: Abnormal   Collection Time   06/22/11 11:55 AM      Component Value Range   Glucose-Capillary 261 (*) 70 - 99 (mg/dL)     Micro: Recent Results (from the past 240 hour(s))  WET PREP, GENITAL     Status: Abnormal   Collection Time   06/20/11 10:30 AM      Component Value Range Status Comment   Yeast Wet Prep HPF POC FEW (*) NONE SEEN  Final    Trich, Wet Prep NONE SEEN  NONE SEEN  Final    Clue Cells Wet Prep HPF POC FEW (*) NONE SEEN  Final    WBC, Wet Prep HPF POC MODERATE (*) NONE SEEN  Final   URINE CULTURE     Status: Normal   Collection Time   06/20/11 12:06 PM      Component Value Range Status Comment   Specimen Description URINE, CLEAN CATCH   Final    Special Requests NONE   Final    Culture  Setup Time [id number]   Final    Colony Count >=100,000 COLONIES/ML   Final    Culture YEAST   Final    Report Status 06/21/2011 FINAL   Final   CULTURE, BLOOD (ROUTINE X 2)      Status: Normal (Preliminary result)   Collection Time   06/20/11 12:20 PM      Component Value Range Status Comment   Specimen Description BLOOD LEFT HAND   Final    Special Requests BOTTLES DRAWN AEROBIC AND ANAEROBIC [facility] EACH   Final    Culture  Setup Time [id number]   Final    Culture     Final    Value:        BLOOD CULTURE RECEIVED NO GROWTH TO DATE CULTURE WILL BE HELD FOR 5 DAYS BEFORE ISSUING A FINAL NEGATIVE REPORT   Report Status PENDING   Incomplete   CULTURE, BLOOD (ROUTINE X 2)     Status: Normal (Preliminary result)   Collection Time   06/20/11 12:30 PM      Component Value Range Status Comment   Specimen Description BLOOD LEFT ARM   Final    Special Requests BOTTLES DRAWN AEROBIC AND ANAEROBIC [facility] EACH   Final    Culture  Setup Time [id number]   Final    Culture     Final    Value:        BLOOD CULTURE RECEIVED NO GROWTH TO DATE CULTURE WILL BE HELD FOR 5 DAYS BEFORE ISSUING A FINAL NEGATIVE REPORT   Report Status PENDING   Incomplete     Studies/Results: Ct Abdomen Pelvis W Contrast  06/20/2011  *RADIOLOGY REPORT*  Clinical Data: Lower abdominal and pelvic pain and pressure. Leukocytosis.  CT ABDOMEN AND PELVIS WITH CONTRAST  Technique:  Multidetector CT imaging of the abdomen and pelvis was performed following the standard protocol during bolus administration of intravenous contrast.  Contrast: OMNIPAQUE IOHEXOL 300 MG/ML IV SOLN  Comparison: 08/13/2004  Findings: Increased cardiomegaly is seen since previous study. Diffuse bibasilar air space opacity is seen as well as a tiny right pleural effusion.  These findings are consistent with congestive heart failure.  The liver, gallbladder, pancreas, spleen, and adrenal glands are normal in appearance.  IVC filter is seen in place.  Renal cysts and  parenchymal scarring again seen bilaterally.  Right extrarenal pelvis noted, without evidence of caliectasis or ureterectasis. Mild diffuse bladder wall thickening and mucosal  enhancement is again noted, which may be seen with neurogenic bladder, cystitis, or chronic bladder outlet obstruction.  Previous hysterectomy noted.  Severe diverticulosis seen involving the descending and sigmoid colon, however there is no evidence of diverticulitis.  No other inflammatory process or abnormal fluid collections are identified. No soft tissue masses or lymphadenopathy identified within the abdomen or pelvis.  IMPRESSION:  1.  Increased cardiomegaly, bibasilar airspace disease, and tiny right pleural effusion, consistent with congestive heart failure. 2.  No acute findings within the abdomen or pelvis. 3.  Mild diffuse bladder wall thickening and enhancement. Differential diagnosis includes neurogenic bladder, cystitis, or chronic bladder outlet obstruction. 3.  Severe sigmoid diverticulosis.  No radiographic evidence of diverticulitis.  Original Report Authenticated By: Danae Orleans, M.D.   Nm Pulmonary Per & Vent  06/20/2011  *RADIOLOGY REPORT*  Clinical Data: Congestive heart failure.  Pulmonary embolus and January 2009.  IVC filter in place.  Shortness of breath.  Elevated D-dimer.  NM PULMONARY VENTILATION AND PERFUSION SCAN  Radiopharmaceutical: 8.6 mCi xenon 133 inhaled.  4.3 mCi technetium 55m MAA IV.  Comparison: Chest x-ray dated 06/20/2011  Findings: The patient has chronic elevation of the right hemidiaphragm.  The ventilation study is otherwise normal.  The perfusion study is normal.  IMPRESSION: Normal ventilation perfusion lung scan.  No evidence of pulmonary embolism.  Original Report Authenticated By: Gwynn Burly, M.D.    Medications:  Scheduled Meds:   . albumin human  12.5 g Intravenous Once  . albuterol-ipratropium  2 puff Inhalation Q6H  . ALPRAZolam  0.25 mg Oral QHS  . aspirin EC  81 mg Oral QPM  . enoxaparin (LOVENOX) injection  30 mg Subcutaneous Q24H  . fluconazole  100 mg Oral Q48H  . fluticasone  1 spray Each Nare Daily  . furosemide  40 mg Intravenous  Once  . insulin aspart  0-20 Units Subcutaneous Q4H  . insulin lispro protamine-insulin lispro  50 Units Subcutaneous BID AC  . metoprolol tartrate  12.5 mg Oral BID  . piperacillin-tazobactam (ZOSYN)  IV  2.25 g Intravenous Q8H  . saccharomyces boulardii  250 mg Oral BID  . Tamsulosin HCl  0.4 mg Oral QPC breakfast  . DISCONTD: digoxin  125 mcg Oral QODAY  . DISCONTD: fluconazole  100 mg Oral Daily  . DISCONTD: insulin NPH-insulin regular  50 Units Subcutaneous BID AC  . DISCONTD: piperacillin-tazobactam (ZOSYN)  IV  3.375 g Intravenous Q8H  . DISCONTD: sodium chloride  250 mL Intravenous Once   Continuous Infusions:   . sodium chloride    . DISCONTD: sodium chloride 75 mL/hr at 06/22/11 0754   PRN Meds:.acetaminophen, acetaminophen, albuterol, alum & mag hydroxide-simeth, HYDROcodone-acetaminophen, ondansetron (ZOFRAN) IV, ondansetron, traMADol, DISCONTD: albuterol-ipratropium   Assessment:   Active Problems:   AKI with anuria, due to ATN , vs contrast nephropathy Ron Agee hydration, Nephrology dr patel consulted   Pyelonephritis, also has a pneumonia on CT abdomen and pelvis, initiated on vancomycin plus Zosyn yesterday, on fluconazole for candida in the urine,continue for total of 5 days   DC vancomycin, continue Zosyn and fluconazole   Diabetes mellitus started on Lantus, need to switch to insulin 50-50 and continue sliding scale insulin, hemoglobin A1c of 9.3 , decrease dose due to renal insuff   Hypertension continue metoprolol   Myocardial infarction. With abnormal troponin, cardiology has been  consulted, 2-D echo is pending, this is likely secondary to ischemic cardiomyopathy in the setting of renal insufficiency,   Coronary artery disease continue aspirin, beta blocker   History of cerebrovascular accident. Continue aspirin   CHF exacerbation with an EF of 35-40%, repeat 2-D echo is pending, LASIX HELD DUE TO JUMP IN CREATININE  Hyponatremia/hyperkalemia    Patient received a dose of her insulin this morning repeat potassium is 4.8  Hyponatremia likely secondary to SIADH, initiated on fluid     LOS: 2 days   [facility] 06/22/2011, 2:21 PM

## 2011-06-22 NOTE — Progress Notes (Signed)
UR complete 

## 2011-06-22 NOTE — Progress Notes (Signed)
Contact precautions d/c. Pt. C.diff PCR was negative.  Family and patient informed.  Thad Osoria, Joslyn Devon

## 2011-06-22 NOTE — Progress Notes (Signed)
2D Echo completed.  Emmagrace Runkel Johnson, RDCS 

## 2011-06-22 NOTE — Progress Notes (Signed)
Subjective:  No new complaints. Shortness of breath continues. Significant oliguria with acute renal failure.  Objective:  Vital Signs in the last 24 hours: Temp:  [97.7 F (36.5 C)-98.8 F (37.1 C)] 97.7 F (36.5 C) (02/04 1438) Pulse Rate:  [83-90] 83  (02/04 1438) Cardiac Rhythm:  [-] Normal sinus rhythm (02/04 0745) Resp:  [20-22] 20  (02/04 1438) BP: (100-119)/(63-79) 104/63 mmHg (02/04 1438) SpO2:  [95 %-100 %] 98 % (02/04 1438)  Physical Exam: BP Readings from Last 1 Encounters:  06/22/11 104/63    Wt Readings from Last 1 Encounters:  06/20/11 65.318 kg (144 lb)    Weight change:   HEENT: [place]/AT, Eyes-Brown, PERL, EOMI, Conjunctiva-Pink, Sclera-Non-icteric Neck: No JVD, No bruit, Trachea midline. Lungs:  Crackles lower lobe area, Bilateral. Cardiac:  Regular rhythm, normal S1 and S2, no S3.  Abdomen:  Soft, non-tender. Extremities:  No edema present. No cyanosis. No clubbing. CNS: AxOx3, Cranial nerves grossly intact, moves all 4 extremities. Right handed. Skin: Warm and dry.   Intake/Output from previous day: 02/03 0701 - 02/04 0700 In: 1596.7 [P.O.:1200; I.V.:296.7; IV Piggyback:100] Out: 700 [Urine:700]    Lab Results: BMET    Component Value Date/Time   NA 120* 06/22/2011 0502   K 4.8 06/22/2011 0502   CL 85* 06/22/2011 0502   CO2 20 06/22/2011 0502   GLUCOSE 205* 06/22/2011 0502   BUN 63* 06/22/2011 0502   CREATININE 4.61* 06/22/2011 0502   CALCIUM 7.8* 06/22/2011 0502   GFRNONAA 8* 06/22/2011 0502   GFRAA 9* 06/22/2011 0502   CBC    Component Value Date/Time   WBC 8.6 06/21/2011 0711   WBC 9.1 08/23/2009 0819   RBC 3.34* 06/21/2011 0711   RBC 4.42 08/23/2009 0819   HGB 9.0* 06/21/2011 0711   HGB 12.6 08/23/2009 0819   HCT 28.6* 06/21/2011 0711   HCT 38.4 08/23/2009 0819   PLT 121* 06/21/2011 0711   PLT 243 08/23/2009 0819   MCV 85.6 06/21/2011 0711   MCV 86.8 08/23/2009 0819   MCH 26.9 06/21/2011 0711   MCH 28.4 08/23/2009 0819   MCHC 31.5 06/21/2011 0711   MCHC 32.7 08/23/2009 0819   RDW 15.8* 06/21/2011 0711   RDW 16.1* 08/23/2009 0819   LYMPHSABS 0.8 06/20/2011 1000   LYMPHSABS 1.4 08/23/2009 0819   MONOABS 1.0 06/20/2011 1000   MONOABS 0.7 08/23/2009 0819   EOSABS 0.3 06/20/2011 1000   EOSABS 0.3 08/23/2009 0819   BASOSABS 0.0 06/20/2011 1000   BASOSABS 0.0 08/23/2009 0819   CARDIAC ENZYMES Lab Results  Component Value Date   CKTOTAL 79 06/21/2011   CKMB 4.0 06/21/2011   TROPONINI 0.80* 06/21/2011    Assessment/Plan:  CHF , Acute on chronic, left heart systolic failure  Pyelonephritis  Diabetes Mellitus  Hypertension  Pseudohyponatremia  Acute Renal Failure-ATN. Appreciate renal consult. Discussed with patient and daughter to accept DNR and comfort care. Use 10 mcg/min NTG drip for congestive heart failure/vasodilatation Decrease IV fluids.    LOS: 2 days    Orpah Cobb  MD  06/22/2011, 3:32 PM

## 2011-06-22 NOTE — Progress Notes (Signed)
ANTIBIOTIC CONSULT NOTE - FOLLOW UP  Pharmacy Consult for Zosyn/Fluconazole Indication: PNA/Candida UTI  Allergies  Allergen Reactions  . Avelox (Moxifloxacin Hcl In Nacl)   . Coumadin Other (See Comments)    Causes severe bleeding  . Proton Pump Inhibitors     Patient Measurements: Height: 5\' 3"  (160 cm) Weight: 144 lb (65.318 kg) IBW/kg (Calculated) : 52.4    Vital Signs: Temp: 97.7 F (36.5 C) (02/04 0532) Temp src: Oral (02/04 0532) BP: 119/79 mmHg (02/04 0532) Pulse Rate: 89  (02/04 0532) Intake/Output from previous day: 02/03 0701 - 02/04 0700 In: 1596.7 [P.O.:1200; I.V.:296.7; IV Piggyback:100] Out: 700 [Urine:700] Intake/Output from this shift:    Labs:  Basename 06/22/11 0502 06/21/11 0711 06/20/11 2250 06/20/11 1000  WBC -- 8.6 -- 13.1*  HGB -- 9.0* -- 9.3*  PLT -- 121* -- PLATELET CLUMPS NOTED ON SMEAR, COUNT APPEARS ADEQUATE  LABCREA -- -- -- --  CREATININE 4.61* -- 1.81* 1.38*   Estimated Creatinine Clearance: 8.4 ml/min (by C-G formula based on Cr of 4.61).   Microbiology: Recent Results (from the past 720 hour(s))  WET PREP, GENITAL     Status: Abnormal   Collection Time   06/20/11 10:30 AM      Component Value Range Status Comment   Yeast Wet Prep HPF POC FEW (*) NONE SEEN  Final    Trich, Wet Prep NONE SEEN  NONE SEEN  Final    Clue Cells Wet Prep HPF POC FEW (*) NONE SEEN  Final    WBC, Wet Prep HPF POC MODERATE (*) NONE SEEN  Final   URINE CULTURE     Status: Normal   Collection Time   06/20/11 12:06 PM      Component Value Range Status Comment   Specimen Description URINE, CLEAN CATCH   Final    Special Requests NONE   Final    Culture  Setup Time [id number]   Final    Colony Count >=100,000 COLONIES/ML   Final    Culture YEAST   Final    Report Status 06/21/2011 FINAL   Final   CULTURE, BLOOD (ROUTINE X 2)     Status: Normal (Preliminary result)   Collection Time   06/20/11 12:20 PM      Component Value Range Status Comment   Specimen Description BLOOD LEFT HAND   Final    Special Requests BOTTLES DRAWN AEROBIC AND ANAEROBIC [facility] EACH   Final    Culture  Setup Time [id number]   Final    Culture     Final    Value:        BLOOD CULTURE RECEIVED NO GROWTH TO DATE CULTURE WILL BE HELD FOR 5 DAYS BEFORE ISSUING A FINAL NEGATIVE REPORT   Report Status PENDING   Incomplete   CULTURE, BLOOD (ROUTINE X 2)     Status: Normal (Preliminary result)   Collection Time   06/20/11 12:30 PM      Component Value Range Status Comment   Specimen Description BLOOD LEFT ARM   Final    Special Requests BOTTLES DRAWN AEROBIC AND ANAEROBIC [facility] EACH   Final    Culture  Setup Time [id number]   Final    Culture     Final    Value:        BLOOD CULTURE RECEIVED NO GROWTH TO DATE CULTURE WILL BE HELD FOR 5 DAYS BEFORE ISSUING A FINAL NEGATIVE REPORT   Report Status PENDING   Incomplete  Anti-infectives     Start     Dose/Rate Route Frequency Ordered Stop   06/21/11 1000   fluconazole (DIFLUCAN) tablet 100 mg        100 mg Oral Daily 06/20/11 1500     06/20/11 2200  piperacillin-tazobactam (ZOSYN) IVPB 3.375 g       3.375 g 12.5 mL/hr over 240 Minutes Intravenous 3 times per day 06/20/11 1516     06/20/11 1530   vancomycin (VANCOCIN) 750 mg in sodium chloride 0.9 % 150 mL IVPB  Status:  Discontinued        750 mg 150 mL/hr over 60 Minutes Intravenous Every 24 hours 06/20/11 1516 06/21/11 1232   06/20/11 1500  piperacillin-tazobactam (ZOSYN) IVPB 3.375 g       3.375 g 100 mL/hr over 30 Minutes Intravenous  Once 06/20/11 1458 06/20/11 1603   06/20/11 1230   cefTRIAXone (ROCEPHIN) 1 g in dextrose 5 % 50 mL IVPB  Status:  Discontinued        1 g 100 mL/hr over 30 Minutes Intravenous Every 24 hours 06/20/11 1221 06/20/11 1434   06/20/11 1000   cefTRIAXone (ROCEPHIN) 1 g in dextrose 5 % 50 mL IVPB        1 g 100 mL/hr over 30 Minutes Intravenous  Once 06/20/11 0955 06/20/11 1103   06/20/11 1000   fluconazole (DIFLUCAN) tablet  150 mg        150 mg Oral  Once 06/20/11 0955 06/20/11 1033          Assessment:  Day #3 Zosyn for presumed PNA  Day #3 Fluconazole for Candida UTI  Renal function is significantly worsening  Dosing adjustments required  Goal of Therapy:  Eradication of infection  Plan:   Decrease Zosyn 2.25gm IV q8h  Decrease fluconazole 100mg  PO q48h, although can it be d/c after 3 days therapy?  Continue to follow renal function & adjust as needed   Loralee Pacas, PharmD, BCPS Pager: [phone] 06/22/2011,8:44 AM

## 2011-06-22 NOTE — Progress Notes (Signed)
Pt has had only 50cc of urine output on entire shift. Bladder scanned pt and largest amount was 16ml. Pt was having dyspnea at rest which is relieved by sitting up. Notified NP on call, will continue to monitor pt.

## 2011-06-22 NOTE — Consult Note (Signed)
Reason for Consult: Evaluation and management of acute renal failure on chronic kidney disease stage III Referring Physician: Glenetta Borg ([facility])  [name] is an 78 y.o. female.  HPI: 78 year old African American woman with past medical history significant for insulin-dependent diabetes "for several years" with what appears to be retinopathy/vision loss as well as hypertension and peripherovascular disease. She also has a history of congestive heart failure (ejection fraction 30-35%). She originally presented to the hospital 3 days ago with what appears to be recurrent urinary tract infection after outpatient antibiotic management with associated CHF versus COPD exacerbation. She was started on broad-spectrum antibiotics and underwent a negative VQ scan. For concerns of abdominal pain, she underwent CT scan of the abdomen with contrast on 06/20/2011. In July of 2012, creatinine was 1.8, on admission February 2 creatinine was 1.4 and had risen to 1.9 following CT abdomen the same day. When labs were checked earlier today, creatinine has now risen to 4.6 and urine output has dropped to the anuric range after fair urine output yesterday (charted at 650 mL in the morning shift and 50 mL in the evening shift). CT scan of the abdomen also showed a few pulmonary cuts and revealed the presence of pleural effusion/congestive heart failure exacerbation which was treated appropriately with diuretic therapy.  The patient denies any prior history of acute renal insufficiency, denies knowledge or history of proteinuria, denies a history of kidney stones and does not have hematuria. Does have a history of urinary tract infection in the past. Denies regular nonsteroidal anti-inflammatory drug use. No recent RAS blockers. No critical/acute hypotension episodes noted and sinus tachycardia that was present on admission had resolved.    Past Medical History  Diagnosis Date  . Diabetes mellitus   .  Hypertension   . Vascular disease   . Shortness of breath     Past Surgical History  Procedure Date  . Bladder tack   . Femoral artery stent     No family history on file.  Social History:  reports that she has never smoked. She does not have any smokeless tobacco history on file. She reports that she does not drink alcohol or use illicit drugs.  Allergies:  Allergies  Allergen Reactions  . Avelox (Moxifloxacin Hcl In Nacl)   . Coumadin Other (See Comments)    Causes severe bleeding  . Proton Pump Inhibitors     Medications:  Scheduled:   . albumin human  12.5 g Intravenous Once  . albuterol-ipratropium  2 puff Inhalation Q6H  . ALPRAZolam  0.25 mg Oral QHS  . aspirin EC  81 mg Oral QPM  . enoxaparin (LOVENOX) injection  30 mg Subcutaneous Q24H  . fluconazole  100 mg Oral Q48H  . fluticasone  1 spray Each Nare Daily  . furosemide  40 mg Intravenous Once  . insulin aspart  0-20 Units Subcutaneous Q4H  . insulin lispro protamine-insulin lispro  30 Units Subcutaneous BID AC  . metoprolol tartrate  12.5 mg Oral BID  . piperacillin-tazobactam (ZOSYN)  IV  2.25 g Intravenous Q8H  . saccharomyces boulardii  250 mg Oral BID  . Tamsulosin HCl  0.4 mg Oral QPC breakfast  . DISCONTD: digoxin  125 mcg Oral QODAY  . DISCONTD: fluconazole  100 mg Oral Daily  . DISCONTD: insulin lispro protamine-insulin lispro  50 Units Subcutaneous BID AC  . DISCONTD: insulin NPH-insulin regular  50 Units Subcutaneous BID AC  . DISCONTD: piperacillin-tazobactam (ZOSYN)  IV  3.375 g  Intravenous Q8H  . DISCONTD: sodium chloride  250 mL Intravenous Once    Results for orders placed during the hospital encounter of 06/20/11 (from the past 48 hour(s))  GLUCOSE, CAPILLARY     Status: Abnormal   Collection Time   06/20/11  7:00 PM      Component Value Range Comment   Glucose-Capillary 461 (*) 70 - 99 (mg/dL)    Comment 1 Notify RN      Comment 2 Documented in Chart     GLUCOSE, CAPILLARY     Status:  Abnormal   Collection Time   06/20/11  8:05 PM      Component Value Range Comment   Glucose-Capillary 536 (*) 70 - 99 (mg/dL)   CARDIAC PANEL(CRET KIN+CKTOT+MB+TROPI)     Status: Abnormal   Collection Time   06/20/11 10:50 PM      Component Value Range Comment   Total CK 80  7 - 177 (U/L)    CK, MB 3.6  0.3 - 4.0 (ng/mL)    Troponin I 0.55 (*) <0.30 (ng/mL)    Relative Index RELATIVE INDEX IS INVALID  0.0 - 2.5    COMPREHENSIVE METABOLIC PANEL     Status: Abnormal   Collection Time   06/20/11 10:50 PM      Component Value Range Comment   Sodium 125 (*) 135 - 145 (mEq/L)    Potassium 5.7 (*) 3.5 - 5.1 (mEq/L)    Chloride 92 (*) 96 - 112 (mEq/L)    CO2 20  19 - 32 (mEq/L)    Glucose, Bld 478 (*) 70 - 99 (mg/dL)    BUN 44 (*) 6 - 23 (mg/dL)    Creatinine, Ser 0.98 (*) 0.50 - 1.10 (mg/dL)    Calcium 8.5  8.4 - 10.5 (mg/dL)    Total Protein 6.8  6.0 - 8.3 (g/dL)    Albumin 2.5 (*) 3.5 - 5.2 (g/dL)    AST 20  0 - 37 (U/L)    ALT 22  0 - 35 (U/L)    Alkaline Phosphatase 73  39 - 117 (U/L)    Total Bilirubin 0.6  0.3 - 1.2 (mg/dL)    GFR calc non Af Amer 25 (*) >90 (mL/min)    GFR calc Af Amer 29 (*) >90 (mL/min)   MAGNESIUM     Status: Normal   Collection Time   06/20/11 10:50 PM      Component Value Range Comment   Magnesium 2.2  1.5 - 2.5 (mg/dL)   HEMOGLOBIN J1B     Status: Abnormal   Collection Time   06/20/11 10:50 PM      Component Value Range Comment   Hemoglobin A1C 9.3 (*) <5.7 (%)    Mean Plasma Glucose 220 (*) <117 (mg/dL)   LACTIC ACID, PLASMA     Status: Abnormal   Collection Time   06/20/11 10:50 PM      Component Value Range Comment   Lactic Acid, Venous 3.6 (*) 0.5 - 2.2 (mmol/L)   LIPASE, BLOOD     Status: Abnormal   Collection Time   06/20/11 10:50 PM      Component Value Range Comment   Lipase 67 (*) 11 - 59 (U/L)   TSH     Status: Normal   Collection Time   06/20/11 10:52 PM      Component Value Range Comment   TSH 2.417  0.350 - 4.500 (uIU/mL)   GLUCOSE,  CAPILLARY     Status: Abnormal  Collection Time   06/21/11 12:21 AM      Component Value Range Comment   Glucose-Capillary 454 (*) 70 - 99 (mg/dL)   GLUCOSE, CAPILLARY     Status: Abnormal   Collection Time   06/21/11  3:47 AM      Component Value Range Comment   Glucose-Capillary 461 (*) 70 - 99 (mg/dL)   POTASSIUM     Status: Normal   Collection Time   06/21/11  7:10 AM      Component Value Range Comment   Potassium 4.4  3.5 - 5.1 (mEq/L) DELTA CHECK NOTED  CBC     Status: Abnormal   Collection Time   06/21/11  7:11 AM      Component Value Range Comment   WBC 8.6  4.0 - 10.5 (K/uL)    RBC 3.34 (*) 3.87 - 5.11 (MIL/uL)    Hemoglobin 9.0 (*) 12.0 - 15.0 (g/dL)    HCT 78.2 (*) 95.6 - 46.0 (%)    MCV 85.6  78.0 - 100.0 (fL)    MCH 26.9  26.0 - 34.0 (pg)    MCHC 31.5  30.0 - 36.0 (g/dL)    RDW 21.3 (*) 08.6 - 15.5 (%)    Platelets 121 (*) 150 - 400 (K/uL)   CARDIAC PANEL(CRET KIN+CKTOT+MB+TROPI)     Status: Abnormal   Collection Time   06/21/11  7:11 AM      Component Value Range Comment   Total CK 79  7 - 177 (U/L)    CK, MB 4.0  0.3 - 4.0 (ng/mL)    Troponin I 0.80 (*) <0.30 (ng/mL)    Relative Index RELATIVE INDEX IS INVALID  0.0 - 2.5    GLUCOSE, CAPILLARY     Status: Abnormal   Collection Time   06/21/11  7:33 AM      Component Value Range Comment   Glucose-Capillary 318 (*) 70 - 99 (mg/dL)    Comment 1 Notify RN      Comment 2 Documented in Chart     GLUCOSE, CAPILLARY     Status: Abnormal   Collection Time   06/21/11 12:03 PM      Component Value Range Comment   Glucose-Capillary 281 (*) 70 - 99 (mg/dL)    Comment 1 Notify RN      Comment 2 Documented in Chart     GLUCOSE, CAPILLARY     Status: Abnormal   Collection Time   06/21/11  4:42 PM      Component Value Range Comment   Glucose-Capillary 342 (*) 70 - 99 (mg/dL)    Comment 1 Notify RN      Comment 2 Documented in Chart     GLUCOSE, CAPILLARY     Status: Abnormal   Collection Time   06/21/11  8:08 PM      Component  Value Range Comment   Glucose-Capillary 332 (*) 70 - 99 (mg/dL)   GLUCOSE, CAPILLARY     Status: Abnormal   Collection Time   06/22/11  1:00 AM      Component Value Range Comment   Glucose-Capillary 187 (*) 70 - 99 (mg/dL)   GLUCOSE, CAPILLARY     Status: Abnormal   Collection Time   06/22/11  4:28 AM      Component Value Range Comment   Glucose-Capillary 194 (*) 70 - 99 (mg/dL)   BASIC METABOLIC PANEL     Status: Abnormal   Collection Time   06/22/11  5:02 AM  Component Value Range Comment   Sodium 120 (*) 135 - 145 (mEq/L) REPEATED TO VERIFY   Potassium 4.8  3.5 - 5.1 (mEq/L) REPEATED TO VERIFY   Chloride 85 (*) 96 - 112 (mEq/L)    CO2 20  19 - 32 (mEq/L) REPEATED TO VERIFY   Glucose, Bld 205 (*) 70 - 99 (mg/dL) REPEATED TO VERIFY   BUN 63 (*) 6 - 23 (mg/dL) REPEATED TO VERIFY   Creatinine, Ser 4.61 (*) 0.50 - 1.10 (mg/dL)    Calcium 7.8 (*) 8.4 - 10.5 (mg/dL) REPEATED TO VERIFY   GFR calc non Af Amer 8 (*) >90 (mL/min)    GFR calc Af Amer 9 (*) >90 (mL/min)   GLUCOSE, CAPILLARY     Status: Abnormal   Collection Time   06/22/11  7:46 AM      Component Value Range Comment   Glucose-Capillary 189 (*) 70 - 99 (mg/dL)    Comment 1 Notify RN     GLUCOSE, CAPILLARY     Status: Abnormal   Collection Time   06/22/11 11:55 AM      Component Value Range Comment   Glucose-Capillary 261 (*) 70 - 99 (mg/dL)     Nm Pulmonary Per & Vent  06/20/2011  *RADIOLOGY REPORT*  Clinical Data: Congestive heart failure.  Pulmonary embolus and January 2009.  IVC filter in place.  Shortness of breath.  Elevated D-dimer.  NM PULMONARY VENTILATION AND PERFUSION SCAN  Radiopharmaceutical: 8.6 mCi xenon 133 inhaled.  4.3 mCi technetium 104m MAA IV.  Comparison: Chest x-ray dated 06/20/2011  Findings: The patient has chronic elevation of the right hemidiaphragm.  The ventilation study is otherwise normal.  The perfusion study is normal.  IMPRESSION: Normal ventilation perfusion lung scan.  No evidence of pulmonary  embolism.  Original Report Authenticated By: Gwynn Burly, M.D.   Dg Chest Port 1 View  06/22/2011  *RADIOLOGY REPORT*  Clinical Data: Shortness of breath.  Question congestive heart failure.  PORTABLE CHEST - 1 VIEW  Comparison: Plain films of the chest 12/01/2010, 07/03/2010 and 07/02/2009.  Findings: There is cardiomegaly with interstitial pulmonary edema. Hazy opacities over the chest bilaterally are compatible with layering pleural effusions.  Calcified mitral annulus is noted.  IMPRESSION: Cardiomegaly and pulmonary edema with bilateral pleural effusions.  Original Report Authenticated By: Bernadene Bell. Maricela Curet, M.D.    Review of Systems  Constitutional: Positive for chills and malaise/fatigue. Negative for fever, weight loss and diaphoresis.  HENT: Negative.  Negative for neck pain.   Eyes:       The Patient reports chronic vision loss from Diabetes- unsure if retinopathy or glaucoma  Respiratory: Positive for cough and shortness of breath. Negative for hemoptysis, sputum production and wheezing.   Cardiovascular: Positive for chest pain, orthopnea and PND. Negative for palpitations, claudication and leg swelling.  Gastrointestinal: Positive for nausea and abdominal pain. Negative for heartburn, vomiting, diarrhea, constipation, blood in stool and melena.  Genitourinary: Positive for urgency and frequency. Negative for hematuria and flank pain.  Musculoskeletal: Positive for back pain. Negative for myalgias, joint pain and falls.  Neurological: Positive for weakness.  Endo/Heme/Allergies: Negative.   Psychiatric/Behavioral: The patient is nervous/anxious.   All other systems reviewed and are negative.   Blood pressure 104/63, pulse 83, temperature 97.7 F (36.5 C), temperature source Oral, resp. rate 20, height 5\' 3"  (1.6 m), weight 65.318 kg (144 lb), SpO2 98.00%. Physical Exam  Nursing note and vitals reviewed. Constitutional: She appears well-developed and well-nourished. No  distress.  HENT:  Head: Normocephalic and atraumatic.  Right Ear: External ear normal.  Left Ear: External ear normal.  Mouth/Throat: No oropharyngeal exudate.  Eyes: Conjunctivae are normal. Right eye exhibits no discharge. Left eye exhibits no discharge. No scleral icterus.       Anisocoria with sluggish light reflex bilaterally  Neck: Normal range of motion. Neck supple. JVD present. No tracheal deviation present. No thyromegaly present.  Cardiovascular: Normal rate, regular rhythm and normal heart sounds.  Exam reveals no gallop and no friction rub.   No murmur heard. Respiratory: Effort normal. No stridor. No respiratory distress. She has wheezes. She has rales.  GI: Soft. Bowel sounds are normal. She exhibits no distension and no mass. There is tenderness. There is no rebound and no guarding.  Musculoskeletal: Normal range of motion. She exhibits no edema and no tenderness.  Lymphadenopathy:    She has no cervical adenopathy.  Skin: Skin is warm and dry. No rash noted. She is not diaphoretic. No erythema. No pallor.  Psychiatric: Her behavior is normal. Thought content normal.    Assessment/Plan: 1. Acute renal failure on chronic kidney disease stage III (baseline creatinine around 1.4-1.6, estimated GFR 30-35 mL per minute): Suspect the predominating etiology is contrast-induced nephropathy from recent CT abdomen/pelvis with contrast with 100 mL of iodinated contrast. Possible contributing etiologies include CHF exacerbation with relative renal hypoperfusion and followed by RAS activation from diuretic therapy. Unfortunately, the patient is anuric and still displaying features of volume overload/pulmonary edema. Discontinue her intravenous fluids and allow re-equilibration of third space fluid. We'll empirically check your electrolytes, renal ultrasound and a spot urine protein to creatinine ratio. Discussed with both the patient and her daughter regarding her marginal candidacy as a  dialysis patient as well as the potential side effects/complications to be anticipated given her age and depressed ejection fraction. We'll continue conservative measures for now until both the patient and daughter give [place] directions as to the aggressiveness of care that they would like. We'll continue avoidance of nephrotoxic medications and procedures, maintain strict input and output measurements. 2. Hyponatremia: This is due to acute renal failure and free water excretion defect. Also contributed by hyperglycemia in the component of pseudohyponatremia. From admission labs, it appears that she may have stable and compensated hyponatremia at baseline at around 130 mEq a liter. 3. Congestive heart failure exacerbation: Will discontinue intravenous fluids for now, PRN bolus dose diuretics as needed to help relieve pulmonary symptoms. 4. Urinary tract infection: Candiduria noted-the patient is on fluconazole. 5. COPD exacerbation versus community-acquired pneumonia: The patient's been started on Zosyn, vancomycin discontinued. 6. Diarrhea ongoing evaluation for Clostridium difficile colitis-currently on contact precautions and probiotics.  Disposition: Very difficult decision-making process in management given her overall morbidity/mortality which may be further unchanged by hemodialysis. Also very difficult to predict how long she will need dialysis for and what impact that this will have on her overall quality of life. Agree that palliative care approach as proposed by Dr. Algie Coffer (who knows her for a duration of about 10 years now) may be in the best interest of the patient. Will continue to follow with you.  Harl Wiechmann K. 06/22/2011, 3:27 PM

## 2011-06-23 ENCOUNTER — Inpatient Hospital Stay (HOSPITAL_COMMUNITY): Payer: 59

## 2011-06-23 LAB — GLUCOSE, CAPILLARY
Glucose-Capillary: 141 mg/dL — ABNORMAL HIGH (ref 70–99)
Glucose-Capillary: 196 mg/dL — ABNORMAL HIGH (ref 70–99)
Glucose-Capillary: 238 mg/dL — ABNORMAL HIGH (ref 70–99)

## 2011-06-23 LAB — CBC
Hemoglobin: 7.2 g/dL — ABNORMAL LOW (ref 12.0–15.0)
MCHC: 33.3 g/dL (ref 30.0–36.0)
RDW: 15.3 % (ref 11.5–15.5)

## 2011-06-23 LAB — BASIC METABOLIC PANEL
GFR calc Af Amer: 8 mL/min — ABNORMAL LOW (ref >90)
GFR calc non Af Amer: 7 mL/min — ABNORMAL LOW (ref >90)
Potassium: 4.1 mEq/L (ref 3.5–5.1)
Sodium: 123 mEq/L — ABNORMAL LOW (ref 135–145)

## 2011-06-23 LAB — PROTEIN / CREATININE RATIO, URINE: Protein Creatinine Ratio: 1.08 — ABNORMAL HIGH (ref 0.00–0.15)

## 2011-06-23 LAB — SODIUM, URINE, RANDOM: Sodium, Ur: 45 mEq/L

## 2011-06-23 MED ORDER — INSULIN LISPRO PROT & LISPRO (50-50 MIX) 100 UNIT/ML ~~LOC~~ SUSP
20.0000 [IU] | Freq: Two times a day (BID) | SUBCUTANEOUS | Status: DC
  Administered 2011-06-23 – 2011-06-30 (×14): 20 [IU] via SUBCUTANEOUS
  Filled 2011-06-23: qty 3

## 2011-06-23 MED ORDER — FUROSEMIDE 10 MG/ML IJ SOLN
80.0000 mg | Freq: Once | INTRAMUSCULAR | Status: AC
  Administered 2011-06-23: 80 mg via INTRAVENOUS
  Filled 2011-06-23 (×2): qty 8

## 2011-06-23 NOTE — Progress Notes (Addendum)
Patient ID: NATALEAH SCIONEAUX, female   DOB: Jan 23, 1926, 78 y.o.   MRN: [id number]  S:The patient reports that she is feeling better with less shortness of breath and urine output noted to have picked up overnight.  O:BP 105/52  Pulse 74  Temp(Src) 97.1 F (36.2 C) (Axillary)  Resp 16  Ht 5\' 3"  (1.6 m)  Wt 71.6 kg (157 lb 13.6 oz)  BMI 27.96 kg/m2  SpO2 98%  Intake/Output Summary (Last 24 hours) at 06/23/11 1259 Last data filed at 06/23/11 0506  Gross per 24 hour  Intake    646 ml  Output    386 ml  Net    260 ml   Weight change:  NWG:NFAOZHYQMVH resting in bed-laying flat on her back QIO:NGEXB RRR, normal S1 and S2  Resp:Coarse BS with bibasal fine rales  MWU:XLKG, NT, BS normal Ext:No LE edema      . albuterol-ipratropium  2 puff Inhalation Q6H  . ALPRAZolam  0.25 mg Oral QHS  . aspirin EC  81 mg Oral QPM  . enoxaparin (LOVENOX) injection  30 mg Subcutaneous Q24H  . fluconazole  100 mg Oral Q48H  . fluticasone  1 spray Each Nare Daily  . furosemide  80 mg Intravenous Once  . insulin aspart  0-20 Units Subcutaneous Q4H  . insulin lispro protamine-insulin lispro  20 Units Subcutaneous BID AC  . metoprolol tartrate  12.5 mg Oral BID  . piperacillin-tazobactam (ZOSYN)  IV  2.25 g Intravenous Q8H  . saccharomyces boulardii  250 mg Oral BID  . Tamsulosin HCl  0.4 mg Oral QPC breakfast  . DISCONTD: insulin lispro protamine-insulin lispro  30 Units Subcutaneous BID AC  . DISCONTD: insulin lispro protamine-insulin lispro  50 Units Subcutaneous BID AC   US Renal  06/22/2011  *RADIOLOGY REPORT*  Clinical Data: Acute renal failure  RENAL/URINARY TRACT ULTRASOUND COMPLETE  Comparison:  CT 06/20/2011  Findings:  Right Kidney:  11.1 cm in length.  Several small cysts but no large cyst or mass.  There is an extrarenal pelvis but no true hydronephrosis.  Left Kidney:  11.4 cm in length.  Several small cysts including 8- 0.0 x 1.1 x 1.5 cm cyst of the upper medial cortex.  No  hydronephrosis or mass lesion.  Bladder:  Foley catheter in place.  IMPRESSION: No evidence of renal obstruction.   A 2 cm upper pole cyst of the left kidney.  Numerous other smaller cysts bilaterally.  Extrarenal pelvis on the right  without evidence of true hydronephrosis.  Original Report Authenticated By: Thomasenia Sales, M.D.   Dg Chest Port 1 View  06/23/2011  *RADIOLOGY REPORT*  Clinical Data: Cough.  Shortness of breath.  Congestive heart failure.  PORTABLE CHEST - 1 VIEW  Comparison: 06/22/2011  Findings: Low lung volumes are present, causing crowding of the pulmonary vasculature.  The interstitial edema shown on the prior exam has considerably improved.  There is continued obscuration of the left hemidiaphragm.  Dense mitral annular calcification noted.  Thoracic spondylosis noted.  IMPRESSION:  1.  Pulmonary venous hypertension noted.  The interstitial edema shown on the prior exam is significantly improved. 2.  Mild cardiomegaly with mitral annular calcification. 3.  Low lung volumes. 4.  Thoracic spondylosis. 5.  Indistinct airspace opacity in the left lower lobe, potentially confluent edema, pneumonia, or atelectasis.  Original Report Authenticated By: Dellia Cloud, M.D.   Dg Chest Port 1 View  06/22/2011  *RADIOLOGY REPORT*  Clinical Data: Shortness of breath.  Question congestive heart failure.  PORTABLE CHEST - 1 VIEW  Comparison: Plain films of the chest 12/01/2010, 07/03/2010 and 07/02/2009.  Findings: There is cardiomegaly with interstitial pulmonary edema. Hazy opacities over the chest bilaterally are compatible with layering pleural effusions.  Calcified mitral annulus is noted.  IMPRESSION: Cardiomegaly and pulmonary edema with bilateral pleural effusions.  Original Report Authenticated By: Bernadene Bell. D'ALESSIO, M.D.   Lawrence Santiago  Lab 06/23/11 1610 06/22/11 0502 06/21/11 0710 06/20/11 2250 06/20/11 1000  NA 123* 120* -- 125* 130*  K 4.1 4.8 4.4 5.7* 4.9  CL 84* 85* -- 92* 95*  CO2  20 20 -- 20 22  GLUCOSE 115* 205* -- 478* 381*  BUN 78* 63* -- 44* 41*  CREATININE 4.95* 4.61* -- 1.81* 1.38*  ALB -- -- -- -- --  CALCIUM 7.6* 7.8* -- 8.5 9.1  PHOS -- -- -- -- --   CBC  Lab 06/23/11 0428 06/21/11 0711 06/20/11 1000  WBC 7.1 8.6 13.1*  NEUTROABS -- -- 11.0*  HGB 7.2* 9.0* 9.3*  HCT 21.6* 28.6* 28.8*  MCV 80.3 85.6 83.2  PLT PLATELETS APPEAR DECREASED 121* PLATELET CLUMPS NOTED ON SMEAR, COUNT APPEARS ADEQUATE     Assessment/Plan: 1. Acute renal failure on chronic kidney disease stage III (baseline creatinine around 1.4-1.6, estimated GFR 30-35 mL per minute): Suspect the predominating etiology is contrast-induced nephropathy from recent CT abdomen/pelvis with contrast with 100 mL of iodinated contrast. Possible contributing etiologies include CHF exacerbation with relative renal hypoperfusion and followed by RAS activation from diuretic therapy. Urine output improved with NTG gtt and creatinine seems to be approaching the plateau phase of ATN- no indications noted for HD and electrolytes appear to be improving. Volume status better- will try some lasix this PM to see if we can promote diuresis. Again, I informed both the patient and her family that she would be a poor candidate for long term HD. Renal ultrasound negative for obstruction. 2. Hyponatremia: This is due to acute renal failure and free water excretion defect- appears to be improving. From admission labs, it appears that she may have stable and compensated hyponatremia at baseline at around 130 mEq a liter.  3. Congestive heart failure exacerbation: On NTG gtt, will give lasix bolus today and monitor UOP.  4. Urinary tract infection: Candiduria noted-the patient is on fluconazole.  5. COPD exacerbation versus community-acquired pneumonia: The patient's been started on Zosyn, vancomycin discontinued.  6. Diarrhea: S/P negative evaluation for Clostridium difficile colitis- contact precautions  Discontinued.     Kriste Broman K.

## 2011-06-23 NOTE — Progress Notes (Signed)
Subjective: The patient reports that she is feeling better with less shortness of breath and urine output noted to have picked up overnight   Objective: Vital signs in last 24 hours: Filed Vitals:   06/23/11 0500 06/23/11 0926 06/23/11 1013 06/23/11 1315  BP: 106/54  105/52 116/73  Pulse: 78  74 76  Temp: 97.1 F (36.2 C)   97.5 F (36.4 C)  TempSrc: Axillary   Oral  Resp: 16   16  Height:      Weight: 71.6 kg (157 lb 13.6 oz)     SpO2: 100% 98%  98%    Intake/Output Summary (Last 24 hours) at 06/23/11 1443 Last data filed at 06/23/11 0506  Gross per 24 hour  Intake    306 ml  Output    386 ml  Net    -80 ml    Weight change:   Weight change:  ALP:[id number] resting in bed-laying flat on her back  HGD:JMEQA RRR, normal S1 and S2  Resp:Coarse BS with bibasal fine rales  STM:HDQQ, NT, BS normal  Ext:No LE edema   Lab Results: Results for orders placed during the hospital encounter of 06/20/11 (from the past 24 hour(s))  GLUCOSE, CAPILLARY     Status: Abnormal   Collection Time   06/22/11  4:44 PM      Component Value Range   Glucose-Capillary 137 (*) 70 - 99 (mg/dL)   Comment 1 Notify RN    GLUCOSE, CAPILLARY     Status: Abnormal   Collection Time   06/22/11  7:45 PM      Component Value Range   Glucose-Capillary 103 (*) 70 - 99 (mg/dL)   Comment 1 Notify RN    GLUCOSE, CAPILLARY     Status: Abnormal   Collection Time   06/22/11 11:43 PM      Component Value Range   Glucose-Capillary 137 (*) 70 - 99 (mg/dL)  PROTEIN / CREATININE RATIO, URINE     Status: Abnormal   Collection Time   06/23/11 12:08 AM      Component Value Range   Creatinine, Urine 50.77     Total Protein, Urine 55     PROTEIN CREATININE RATIO 1.08 (*) 0.00 - 0.15   SODIUM, URINE, RANDOM     Status: Normal   Collection Time   06/23/11 12:11 AM      Component Value Range   Sodium, Ur 45    CREATININE, URINE, RANDOM     Status: Normal   Collection Time   06/23/11 12:11 AM      Component Value  Range   Creatinine, Urine 55.3    CBC     Status: Abnormal   Collection Time   06/23/11  4:28 AM      Component Value Range   WBC 7.1  4.0 - 10.5 (K/uL)   RBC 2.69 (*) 3.87 - 5.11 (MIL/uL)   Hemoglobin 7.2 (*) 12.0 - 15.0 (g/dL)   HCT 22.9 (*) 79.8 - 46.0 (%)   MCV 80.3  78.0 - 100.0 (fL)   MCH 26.8  26.0 - 34.0 (pg)   MCHC 33.3  30.0 - 36.0 (g/dL)   RDW 92.1  19.4 - 17.4 (%)   Platelets PLATELETS APPEAR DECREASED  150 - 400 (K/uL)  BASIC METABOLIC PANEL     Status: Abnormal   Collection Time   06/23/11  4:28 AM      Component Value Range   Sodium 123 (*) 135 - 145 (mEq/L)  Potassium 4.1  3.5 - 5.1 (mEq/L)   Chloride 84 (*) 96 - 112 (mEq/L)   CO2 20  19 - 32 (mEq/L)   Glucose, Bld 115 (*) 70 - 99 (mg/dL)   BUN 78 (*) 6 - 23 (mg/dL)   Creatinine, Ser 5.40 (*) 0.50 - 1.10 (mg/dL)   Calcium 7.6 (*) 8.4 - 10.5 (mg/dL)   GFR calc non Af Amer 7 (*) >90 (mL/min)   GFR calc Af Amer 8 (*) >90 (mL/min)  GLUCOSE, CAPILLARY     Status: Abnormal   Collection Time   06/23/11  4:58 AM      Component Value Range   Glucose-Capillary 107 (*) 70 - 99 (mg/dL)  GLUCOSE, CAPILLARY     Status: Abnormal   Collection Time   06/23/11  7:58 AM      Component Value Range   Glucose-Capillary 141 (*) 70 - 99 (mg/dL)  GLUCOSE, CAPILLARY     Status: Abnormal   Collection Time   06/23/11 11:52 AM      Component Value Range   Glucose-Capillary 196 (*) 70 - 99 (mg/dL)     Micro: Recent Results (from the past 240 hour(s))  WET PREP, GENITAL     Status: Abnormal   Collection Time   06/20/11 10:30 AM      Component Value Range Status Comment   Yeast Wet Prep HPF POC FEW (*) NONE SEEN  Final    Trich, Wet Prep NONE SEEN  NONE SEEN  Final    Clue Cells Wet Prep HPF POC FEW (*) NONE SEEN  Final    WBC, Wet Prep HPF POC MODERATE (*) NONE SEEN  Final   URINE CULTURE     Status: Normal   Collection Time   06/20/11 12:06 PM      Component Value Range Status Comment   Specimen Description URINE, CLEAN CATCH    Final    Special Requests NONE   Final    Culture  Setup Time [id number]   Final    Colony Count >=100,000 COLONIES/ML   Final    Culture YEAST   Final    Report Status 06/21/2011 FINAL   Final   CULTURE, BLOOD (ROUTINE X 2)     Status: Normal (Preliminary result)   Collection Time   06/20/11 12:20 PM      Component Value Range Status Comment   Specimen Description BLOOD LEFT HAND   Final    Special Requests BOTTLES DRAWN AEROBIC AND ANAEROBIC [employer] EACH   Final    Culture  Setup Time [id number]   Final    Culture     Final    Value:        BLOOD CULTURE RECEIVED NO GROWTH TO DATE CULTURE WILL BE HELD FOR 5 DAYS BEFORE ISSUING A FINAL NEGATIVE REPORT   Report Status PENDING   Incomplete   CULTURE, BLOOD (ROUTINE X 2)     Status: Normal (Preliminary result)   Collection Time   06/20/11 12:30 PM      Component Value Range Status Comment   Specimen Description BLOOD LEFT ARM   Final    Special Requests BOTTLES DRAWN AEROBIC AND ANAEROBIC [facility] EACH   Final    Culture  Setup Time [id number]   Final    Culture     Final    Value:        BLOOD CULTURE RECEIVED NO GROWTH TO DATE CULTURE WILL BE HELD FOR 5 DAYS BEFORE ISSUING A  FINAL NEGATIVE REPORT   Report Status PENDING   Incomplete   CLOSTRIDIUM DIFFICILE BY PCR     Status: Normal   Collection Time   06/22/11  1:31 PM      Component Value Range Status Comment   C difficile by pcr NEGATIVE  NEGATIVE  Final     Studies/Results: US Renal  06/22/2011  *RADIOLOGY REPORT*  Clinical Data: Acute renal failure  RENAL/URINARY TRACT ULTRASOUND COMPLETE  Comparison:  CT 06/20/2011  Findings:  Right Kidney:  11.1 cm in length.  Several small cysts but no large cyst or mass.  There is an extrarenal pelvis but no true hydronephrosis.  Left Kidney:  11.4 cm in length.  Several small cysts including 8- 0.0 x 1.1 x 1.5 cm cyst of the upper medial cortex.  No hydronephrosis or mass lesion.  Bladder:  Foley catheter in place.  IMPRESSION: No evidence of renal  obstruction.   A 2 cm upper pole cyst of the left kidney.  Numerous other smaller cysts bilaterally.  Extrarenal pelvis on the right  without evidence of true hydronephrosis.  Original Report Authenticated By: Thomasenia Sales, M.D.   Dg Chest Port 1 View  06/23/2011  *RADIOLOGY REPORT*  Clinical Data: Cough.  Shortness of breath.  Congestive heart failure.  PORTABLE CHEST - 1 VIEW  Comparison: 06/22/2011  Findings: Low lung volumes are present, causing crowding of the pulmonary vasculature.  The interstitial edema shown on the prior exam has considerably improved.  There is continued obscuration of the left hemidiaphragm.  Dense mitral annular calcification noted.  Thoracic spondylosis noted.  IMPRESSION:  1.  Pulmonary venous hypertension noted.  The interstitial edema shown on the prior exam is significantly improved. 2.  Mild cardiomegaly with mitral annular calcification. 3.  Low lung volumes. 4.  Thoracic spondylosis. 5.  Indistinct airspace opacity in the left lower lobe, potentially confluent edema, pneumonia, or atelectasis.  Original Report Authenticated By: Dellia Cloud, M.D.   Dg Chest Port 1 View  06/22/2011  *RADIOLOGY REPORT*  Clinical Data: Shortness of breath.  Question congestive heart failure.  PORTABLE CHEST - 1 VIEW  Comparison: Plain films of the chest 12/01/2010, 07/03/2010 and 07/02/2009.  Findings: There is cardiomegaly with interstitial pulmonary edema. Hazy opacities over the chest bilaterally are compatible with layering pleural effusions.  Calcified mitral annulus is noted.  IMPRESSION: Cardiomegaly and pulmonary edema with bilateral pleural effusions.  Original Report Authenticated By: Bernadene Bell. Maricela Curet, M.D.    Medications: Scheduled Meds:   . albuterol-ipratropium  2 puff Inhalation Q6H  . ALPRAZolam  0.25 mg Oral QHS  . aspirin EC  81 mg Oral QPM  . enoxaparin (LOVENOX) injection  30 mg Subcutaneous Q24H  . fluconazole  100 mg Oral Q48H  . fluticasone  1 spray  Each Nare Daily  . furosemide  80 mg Intravenous Once  . furosemide  80 mg Intravenous Once  . insulin aspart  0-20 Units Subcutaneous Q4H  . insulin lispro protamine-insulin lispro  20 Units Subcutaneous BID AC  . metoprolol tartrate  12.5 mg Oral BID  . piperacillin-tazobactam (ZOSYN)  IV  2.25 g Intravenous Q8H  . saccharomyces boulardii  250 mg Oral BID  . Tamsulosin HCl  0.4 mg Oral QPC breakfast  . DISCONTD: insulin lispro protamine-insulin lispro  30 Units Subcutaneous BID AC   Continuous Infusions:   . sodium chloride 10 mL/hr at 06/22/11 1609  . nitroGLYCERIN 10 mcg/min (06/22/11 1650)   PRN Meds:.acetaminophen, acetaminophen, albuterol,  alum & mag hydroxide-simeth, HYDROcodone-acetaminophen, ondansetron (ZOFRAN) IV, ondansetron, traMADol   Assessment:   1. Acute renal failure on chronic kidney disease stage III (baseline creatinine around 1.4-1.6, estimated GFR 30-35 mL per minut contrast-induced nephropathy from recent CT abdomen/pelvis with contras,  contributing etiologies include CHF exacerbation with relative renal hypoperfusion and followed by RAS activation from diuretic therapy. Urine output improved with NTG gtt and creatinine seems to be approaching the plateau phase of ATN- no indications noted for HD and electrolytes appear to be improving. Volume status better- will try some lasix this PM to see if we can promote diuresis. Nephrology has discussed with the patient and her family that she would be a poor candidate for long term HD. Renal ultrasound negative for obstruction.  2. Hyponatremia: This is due to acute renal failure and free water excretion defect- appears to be improving. From admission labs, it appears that she may have stable and compensated hyponatremia at baseline at around 130 mEq a liter.  3. Congestive heart failure exacerbation: On NTG gtt, will give lasix bolus today and monitor UOP. Lasix is being dosed by nephrology and cardiology  4. Urinary tract  infection: Candiduria noted-the patient is on fluconazole.  5. COPD exacerbation versus community-acquired pneumonia: The patient's been started on Zosyn, vancomycin discontinued.  6. Diarrhea: S/P negative evaluation for Clostridium difficile colitis- contact precautions Discontinued.  Disposition, family is considering palliative images of the no improvement over the course of the next 2-3 days   LOS: 3 days   [facility] 06/23/2011, 2:43 PM

## 2011-06-23 NOTE — Progress Notes (Signed)
Spoke with patient and sister at bedside. States lives at home with dtr. Dtr assist with meds, meals and appt. Patient is on home O2 full time, provided by [facility]. Patient is agreeable to [facility] RN for CHF protocol. No PT evals recorded but patient states she is ambulatory, she appears weak and deconditioned, may need futher services. Will continue to follow for d/c needs.

## 2011-06-23 NOTE — Progress Notes (Signed)
Subjective:  Improving urine output. No chest pain or shortness of breath. CXR- improving pulmonary edema.  Objective:  Vital Signs in the last 24 hours: Temp:  [97.1 F (36.2 C)-97.7 F (36.5 C)] 97.1 F (36.2 C) (02/05 0500) Pulse Rate:  [74-85] 74  (02/05 1013) Cardiac Rhythm:  [-] Normal sinus rhythm (02/05 0800) Resp:  [16-20] 16  (02/05 0500) BP: (104-116)/(52-75) 105/52 mmHg (02/05 1013) SpO2:  [95 %-100 %] 98 % (02/05 0926) Weight:  [71.6 kg (157 lb 13.6 oz)] 71.6 kg (157 lb 13.6 oz) (02/05 0500)  Physical Exam: BP Readings from Last 1 Encounters:  06/23/11 105/52    Wt Readings from Last 1 Encounters:  06/23/11 71.6 kg (157 lb 13.6 oz)    Weight change:   HEENT: [place]/AT, Eyes-Brown, PERL, EOMI, Conjunctiva-Pink, Sclera-Non-icteric Neck: No JVD, No bruit, Trachea midline. Lungs:  Few crackles at bases, Bilateral. Cardiac:  Regular rhythm, normal S1 and S2, no S3. II/VI systolic murmur. Abdomen:  Soft, non-tender. Extremities:  No edema present. No cyanosis. No clubbing. CNS: AxOx3, Cranial nerves grossly intact, moves all 4 extremities. Right handed. Skin: Warm and dry.   Intake/Output from previous day: 02/04 0701 - 02/05 0700 In: 1006 [P.O.:720; I.V.:186; IV Piggyback:100] Out: 386 [Urine:385; Emesis/NG output:1]    Lab Results: BMET    Component Value Date/Time   NA 123* 06/23/2011 0428   K 4.1 06/23/2011 0428   CL 84* 06/23/2011 0428   CO2 20 06/23/2011 0428   GLUCOSE 115* 06/23/2011 0428   BUN 78* 06/23/2011 0428   CREATININE 4.95* 06/23/2011 0428   CALCIUM 7.6* 06/23/2011 0428   GFRNONAA 7* 06/23/2011 0428   GFRAA 8* 06/23/2011 0428   CBC    Component Value Date/Time   WBC 7.1 06/23/2011 0428   WBC 9.1 08/23/2009 0819   RBC 2.69* 06/23/2011 0428   RBC 4.42 08/23/2009 0819   HGB 7.2* 06/23/2011 0428   HGB 12.6 08/23/2009 0819   HCT 21.6* 06/23/2011 0428   HCT 38.4 08/23/2009 0819   PLT PLATELETS APPEAR DECREASED 06/23/2011 0428   PLT 243 08/23/2009 0819   MCV 80.3 06/23/2011 0428     MCV 86.8 08/23/2009 0819   MCH 26.8 06/23/2011 0428   MCH 28.4 08/23/2009 0819   MCHC 33.3 06/23/2011 0428   MCHC 32.7 08/23/2009 0819   RDW 15.3 06/23/2011 0428   RDW 16.1* 08/23/2009 0819   LYMPHSABS 0.8 06/20/2011 1000   LYMPHSABS 1.4 08/23/2009 0819   MONOABS 1.0 06/20/2011 1000   MONOABS 0.7 08/23/2009 0819   EOSABS 0.3 06/20/2011 1000   EOSABS 0.3 08/23/2009 0819   BASOSABS 0.0 06/20/2011 1000   BASOSABS 0.0 08/23/2009 0819   CARDIAC ENZYMES Lab Results  Component Value Date   CKTOTAL 79 06/21/2011   CKMB 4.0 06/21/2011   TROPONINI 0.80* 06/21/2011    Assessment/Plan:  Patient Active Hospital Problem List:  Biventricular failure  Stable with IV NTG Pyelonephritis   On antibiotic Diabetes Mellitus,II  Improving control  Hypertension  Stable  Hyponatremia  Acute Renal Failure-ATN.   Appreciate renal consult.  Try to match I & O. Hold Lasix for now. Discussed with patient and daughter to accept DNR and comfort care.  Use 10 mcg/min NTG drip for congestive heart failure/vasodilatation      LOS: 3 days    Orpah Cobb  MD  06/23/2011, 12:23 PM

## 2011-06-24 LAB — ABO/RH: ABO/RH(D): A POS

## 2011-06-24 LAB — GLUCOSE, CAPILLARY
Glucose-Capillary: 189 mg/dL — ABNORMAL HIGH (ref 70–99)
Glucose-Capillary: 249 mg/dL — ABNORMAL HIGH (ref 70–99)
Glucose-Capillary: 279 mg/dL — ABNORMAL HIGH (ref 70–99)

## 2011-06-24 LAB — RENAL FUNCTION PANEL
Albumin: 2.4 g/dL — ABNORMAL LOW (ref 3.5–5.2)
Chloride: 82 mEq/L — ABNORMAL LOW (ref 96–112)
GFR calc Af Amer: 10 mL/min — ABNORMAL LOW (ref >90)
GFR calc non Af Amer: 8 mL/min — ABNORMAL LOW (ref >90)
Phosphorus: 8 mg/dL — ABNORMAL HIGH (ref 2.3–4.6)
Potassium: 4.4 mEq/L (ref 3.5–5.1)
Sodium: 120 mEq/L — ABNORMAL LOW (ref 135–145)

## 2011-06-24 LAB — CBC
MCHC: 34.8 g/dL (ref 30.0–36.0)
Platelets: 34 10*3/uL — ABNORMAL LOW (ref 150–400)
RDW: 15.4 % (ref 11.5–15.5)
WBC: 6.9 10*3/uL (ref 4.0–10.5)

## 2011-06-24 LAB — URIC ACID: Uric Acid, Serum: 7.7 mg/dL — ABNORMAL HIGH (ref 2.4–7.0)

## 2011-06-24 LAB — PREPARE RBC (CROSSMATCH)

## 2011-06-24 MED ORDER — FUROSEMIDE 10 MG/ML IJ SOLN
80.0000 mg | Freq: Once | INTRAMUSCULAR | Status: AC
  Administered 2011-06-24: 80 mg via INTRAVENOUS
  Filled 2011-06-24: qty 8

## 2011-06-24 MED ORDER — NITROGLYCERIN IN D5W 200-5 MCG/ML-% IV SOLN: 5.0000 ug/min | INTRAVENOUS | Status: DC

## 2011-06-24 MED ORDER — DEXTROSE-NACL 5-0.45 % IV SOLN
INTRAVENOUS | Status: DC
  Administered 2011-06-24: 18:00:00 via INTRAVENOUS

## 2011-06-24 MED ORDER — ALBUMIN HUMAN 5 % IV SOLN
12.5000 g | Freq: Once | INTRAVENOUS | Status: AC
  Administered 2011-06-24: 12.5 g via INTRAVENOUS
  Filled 2011-06-24: qty 250

## 2011-06-24 MED ORDER — IPRATROPIUM-ALBUTEROL 18-103 MCG/ACT IN AERO
2.0000 | INHALATION_SPRAY | Freq: Four times a day (QID) | RESPIRATORY_TRACT | Status: DC | PRN
  Filled 2011-06-24: qty 14.7

## 2011-06-24 MED ORDER — NITROGLYCERIN 0.4 MG/HR TD PT24
0.4000 mg | MEDICATED_PATCH | Freq: Every day | TRANSDERMAL | Status: DC
  Administered 2011-06-24 – 2011-06-26 (×3): 0.4 mg via TRANSDERMAL
  Filled 2011-06-24 (×4): qty 1

## 2011-06-24 NOTE — Progress Notes (Signed)
S: Urine ojutput has clearly picked up over the past 24 hours - daughter says her Mom has been a little nauseous but did keep down broth and pudding for lunch; pt says she is "just a little" short of breath  O: Medications: (I have reviewed) Infusions:    . sodium chloride 50 mL/hr at 06/24/11 1039  . nitroGLYCERIN 10 mcg/min (06/24/11 0702)   Scheduled Medications:    . albumin human  12.5 g Intravenous Once  . ALPRAZolam  0.25 mg Oral QHS  . aspirin EC  81 mg Oral QPM  . enoxaparin (LOVENOX) injection  30 mg Subcutaneous Q24H  . fluconazole  100 mg Oral Q48H  . fluticasone  1 spray Each Nare Daily  . furosemide  80 mg Intravenous Once  . insulin aspart  0-20 Units Subcutaneous Q4H  . insulin lispro protamine-insulin lispro  20 Units Subcutaneous BID AC  . metoprolol tartrate  12.5 mg Oral BID  . piperacillin-tazobactam (ZOSYN)  IV  2.25 g Intravenous Q8H  . saccharomyces boulardii  250 mg Oral BID  . Tamsulosin HCl  0.4 mg Oral QPC breakfast  . DISCONTD: albuterol-ipratropium  2 puff Inhalation Q6H   PRN Meds:.acetaminophen, acetaminophen, albuterol, albuterol-ipratropium, alum & mag hydroxide-simeth, HYDROcodone-acetaminophen, ondansetron (ZOFRAN) IV, ondansetron, traMADol  BP 133/78  Pulse 80  Temp(Src) 98.1 F (36.7 C) (Oral)  Resp 22  Ht 5\' 3"  (1.6 m)  Wt 70.4 kg (155 lb 3.3 oz)  BMI 27.49 kg/m2  SpO2 100%   Intake/Output Summary (Last 24 hours) at 06/24/11 1323 Last data filed at 06/24/11 0900  Gross per 24 hour  Intake 628.15 ml  Output   4325 ml  Net -3696.85 ml  I/O last 3 completed shifts: In: 414.1 [I.V.:164.1; IV Piggyback:250] Out: 3435 [Urine:3435] Total I/O In: 304.1 [P.O.:240; I.V.:64.1] Out: 1225 [Urine:1225] Weight change: -1.2 kg (-2 lb 10.3 oz)  EXAM: WUJ:WJXBJYNWGNF resting in bed-laying flat on her back; NAD; very soft spoken; doesn't feel like sitting up for exam but did roll to the side AOZ:HYQMV RRR, normal S1 and S2  Resp:Coarse BS  with crackles heard in both lung fieldsd posteriorly HQI:ONGE, NT, BS normal  XBM:WUXLKGM if any LE edema  Labs: Basic Metabolic Panel:  Lab 06/24/11 0102 06/23/11 0428 06/22/11 0502 06/20/11 2250  NA 120* 123* 120* --  K 4.4 4.1 4.8 --  CL 82* 84* 85* --  CO2 20 20 20  --  GLUCOSE 208* 115* 205* --  BUN 80* 78* 63* --  CREATININE 4.47* 4.95* 4.61* --  CALCIUM 7.8* 7.6* 7.8* --  MG -- -- -- 2.2  PHOS 8.0* -- -- --    Liver Function Tests:  Lab 06/24/11 0400 06/20/11 2250 06/20/11 1000  AST -- 20 19  ALT -- 22 19  ALKPHOS -- 73 71  BILITOT -- 0.6 0.7  PROT -- 6.8 7.2  ALBUMIN 2.4* 2.5* 2.9*    Lab 06/20/11 2250 06/20/11 1000  LIPASE 67* 88*  AMYLASE -- --   No results found for this basename: AMMONIA:3 in the last 168 hours  CBC:  Lab 06/24/11 0400 06/23/11 0428 06/21/11 0711 06/20/11 1000  WBC 6.9 7.1 8.6 --  NEUTROABS -- -- -- 11.0*  HGB 7.3* 7.2* 9.0* --  HCT 21.0* 21.6* 28.6* --  MCV 79.2 80.3 85.6 83.2  PLT 34* PLATELETS APPEAR DECREASED 121* --    Cardiac Enzymes:  Lab 06/21/11 0711 06/20/11 2250 06/20/11 1510 06/20/11 1000  CKTOTAL 79 80 88 77  CKMB 4.0  3.6 3.1 3.0  CKMBINDEX -- -- -- --  TROPONINI 0.80* 0.55* <0.30 <0.30    CBG:  Lab 06/24/11 1155 06/24/11 0722 06/24/11 0358 06/24/11 0016 06/23/11 2021  GLUCAP 225* 189* 198* 243* 238*  Studies/Results: US Renal  06/22/2011  *RADIOLOGY REPORT*  Clinical Data: Acute renal failure  RENAL/URINARY TRACT ULTRASOUND COMPLETE  Comparison:  CT 06/20/2011  Findings:  Right Kidney:  11.1 cm in length.  Several small cysts but no large cyst or mass.  There is an extrarenal pelvis but no true hydronephrosis.  Left Kidney:  11.4 cm in length.  Several small cysts including 8- 0.0 x 1.1 x 1.5 cm cyst of the upper medial cortex.  No hydronephrosis or mass lesion.  Bladder:  Foley catheter in place.  IMPRESSION: No evidence of renal obstruction.   A 2 cm upper pole cyst of the left kidney.  Numerous other smaller  cysts bilaterally.  Extrarenal pelvis on the right  without evidence of true hydronephrosis.  Original Report Authenticated By: Thomasenia Sales, M.D.   Dg Chest Port 1 View  06/23/2011  *RADIOLOGY REPORT*  Clinical Data: Cough.  Shortness of breath.  Congestive heart failure.  PORTABLE CHEST - 1 VIEW  Comparison: 06/22/2011  Findings: Low lung volumes are present, causing crowding of the pulmonary vasculature.  The interstitial edema shown on the prior exam has considerably improved.  There is continued obscuration of the left hemidiaphragm.  Dense mitral annular calcification noted.  Thoracic spondylosis noted.  IMPRESSION:  1.  Pulmonary venous hypertension noted.  The interstitial edema shown on the prior exam is significantly improved. 2.  Mild cardiomegaly with mitral annular calcification. 3.  Low lung volumes. 4.  Thoracic spondylosis. 5.  Indistinct airspace opacity in the left lower lobe, potentially confluent edema, pneumonia, or atelectasis.  Original Report Authenticated By: Dellia Cloud, M.D.   Assessment/Plan: 1. Acute renal failure on chronic kidney disease stage III (baseline creatinine around 1.4-1.6, estimated GFR 30-35 mL per minute): Suspect the predominating etiology is contrast-induced nephropathy from recent CT abdomen/pelvis with contrast with 100 mL of iodinated contrast. Possible contributing etiologies include CHF exacerbation with relative renal hypoperfusion and followed by RAS activation from diuretic therapy. Urine output improved with NTG gtt and creatinine seems have reached plateau and is actually down some today, with excellent UOP over the past 24 hours with a single dose of lasix 80 mg midday yesterday. No indications noted for HD and electrolytes appear be stable. Volume status better- will try some additional lasix this PM to see if we can promote further diuresis (and may help with her hyponatremia as well). She would be a poor candidate for long term HD. Renal  ultrasound negative for obstruction.  2. Hyponatremia: This is due to acute renal failure and free water excretion defect- hung up around 120. From admission labs, it appears that she may have stable and compensated hyponatremia at baseline at around 130 mEq a liter.  3. Congestive heart failure exacerbation: On NTG gtt, will give lasix another 80 mg bolus today and monitor UOP. Could probably stop the NTG soon. 4. Urinary tract infection: Candiduria noted-the patient is on fluconazole.  5. COPD exacerbation versus community-acquired pneumonia: The patient's been started on Zosyn, vancomycin discontinued.  6. Diarrhea: S/P negative evaluation for Clostridium difficile colitis- contact precautions Discontinued.  7.  Has foley in - will stop flomax 8. Anemia with Hb decrease to 7.2 past 2 days.  Check iron studies.  Volume status would probably  allow a unit of blood to be transfused at this point (defer that to primary) Jameia Makris B

## 2011-06-24 NOTE — Progress Notes (Signed)
Inpatient Diabetes Program Recommendations  AACE/ADA: New Consensus Statement on Inpatient Glycemic Control (2009)  Target Ranges:  Prepandial:   less than 140 mg/dL      Peak postprandial:   less than 180 mg/dL (1-2 hours)      Critically ill patients:  140 - 180 mg/dL   Reason for Visit: Hyperglycemia  Results for [name], [name] (MRN [id number]) as of 06/24/2011 11:11  Ref. Range 06/23/2011 17:07 06/23/2011 20:21 06/24/2011 00:16 06/24/2011 03:58 06/24/2011 07:22  Glucose-Capillary Latest Range: 70-99 mg/dL 629 (H) 528 (H) 413 (H) 198 (H) 189 (H)    Inpatient Diabetes Program Recommendations Insulin - Basal: Increase 50/50 to 30 units bid

## 2011-06-24 NOTE — Progress Notes (Signed)
Subjective: Her daughter Steward Drone at bedside. Reports feeling better, denies any shortness of breath on 2 L of oxygen same as she is at home.   Objective: Vital signs in last 24 hours: Filed Vitals:   06/24/11 0657 06/24/11 0811 06/24/11 0815 06/24/11 1032  BP: 118/74  123/74 138/68  Pulse: 78  82 84  Temp: 97.7 F (36.5 C)  97.9 F (36.6 C) 98.5 F (36.9 C)  TempSrc: Oral  Oral Oral  Resp: 24  24 26   Height:      Weight: 70.4 kg (155 lb 3.3 oz)     SpO2: 100% 97% 100% 98%    Intake/Output Summary (Last 24 hours) at 06/24/11 1039 Last data filed at 06/24/11 0900  Gross per 24 hour  Intake 628.15 ml  Output   4325 ml  Net -3696.85 ml    Weight change: -1.2 kg (-2 lb 10.3 oz)  Weight change:  ZOX:WRUEAVWUJWJ resting in bed-laying flat on her back  XBJ:YNWGN RRR, normal S1 and S2  Resp:Coarse BS with bibasal fine rales  FAO:ZHYQ, NT, BS normal  Ext:No LE edema   Lab Results: Results for orders placed during the hospital encounter of 06/20/11 (from the past 24 hour(s))  GLUCOSE, CAPILLARY     Status: Abnormal   Collection Time   06/23/11 11:52 AM      Component Value Range   Glucose-Capillary 196 (*) 70 - 99 (mg/dL)  GLUCOSE, CAPILLARY     Status: Abnormal   Collection Time   06/23/11  5:07 PM      Component Value Range   Glucose-Capillary 231 (*) 70 - 99 (mg/dL)  GLUCOSE, CAPILLARY     Status: Abnormal   Collection Time   06/23/11  8:21 PM      Component Value Range   Glucose-Capillary 238 (*) 70 - 99 (mg/dL)   Comment 1 Notify RN    GLUCOSE, CAPILLARY     Status: Abnormal   Collection Time   06/24/11 12:16 AM      Component Value Range   Glucose-Capillary 243 (*) 70 - 99 (mg/dL)   Comment 1 Documented in Chart     Comment 2 Notify RN    GLUCOSE, CAPILLARY     Status: Abnormal   Collection Time   06/24/11  3:58 AM      Component Value Range   Glucose-Capillary 198 (*) 70 - 99 (mg/dL)   Comment 1 Documented in Chart     Comment 2 Notify RN    RENAL FUNCTION  PANEL     Status: Abnormal   Collection Time   06/24/11  4:00 AM      Component Value Range   Sodium 120 (*) 135 - 145 (mEq/L)   Potassium 4.4  3.5 - 5.1 (mEq/L)   Chloride 82 (*) 96 - 112 (mEq/L)   CO2 20  19 - 32 (mEq/L)   Glucose, Bld 208 (*) 70 - 99 (mg/dL)   BUN 80 (*) 6 - 23 (mg/dL)   Creatinine, Ser 6.57 (*) 0.50 - 1.10 (mg/dL)   Calcium 7.8 (*) 8.4 - 10.5 (mg/dL)   Phosphorus 8.0 (*) 2.3 - 4.6 (mg/dL)   Albumin 2.4 (*) 3.5 - 5.2 (g/dL)   GFR calc non Af Amer 8 (*) >90 (mL/min)   GFR calc Af Amer 10 (*) >90 (mL/min)  URIC ACID     Status: Abnormal   Collection Time   06/24/11  4:00 AM      Component Value Range  Uric Acid, Serum 7.7 (*) 2.4 - 7.0 (mg/dL)  CBC     Status: Abnormal   Collection Time   06/24/11  4:00 AM      Component Value Range   WBC 6.9  4.0 - 10.5 (K/uL)   RBC 2.65 (*) 3.87 - 5.11 (MIL/uL)   Hemoglobin 7.3 (*) 12.0 - 15.0 (g/dL)   HCT 13.0 (*) 86.5 - 46.0 (%)   MCV 79.2  78.0 - 100.0 (fL)   MCH 27.5  26.0 - 34.0 (pg)   MCHC 34.8  30.0 - 36.0 (g/dL)   RDW 78.4  69.6 - 29.5 (%)   Platelets 34 (*) 150 - 400 (K/uL)  GLUCOSE, CAPILLARY     Status: Abnormal   Collection Time   06/24/11  7:22 AM      Component Value Range   Glucose-Capillary 189 (*) 70 - 99 (mg/dL)     Micro: Recent Results (from the past 240 hour(s))  WET PREP, GENITAL     Status: Abnormal   Collection Time   06/20/11 10:30 AM      Component Value Range Status Comment   Yeast Wet Prep HPF POC FEW (*) NONE SEEN  Final    Trich, Wet Prep NONE SEEN  NONE SEEN  Final    Clue Cells Wet Prep HPF POC FEW (*) NONE SEEN  Final    WBC, Wet Prep HPF POC MODERATE (*) NONE SEEN  Final   URINE CULTURE     Status: Normal   Collection Time   06/20/11 12:06 PM      Component Value Range Status Comment   Specimen Description URINE, CLEAN CATCH   Final    Special Requests NONE   Final    Culture  Setup Time [id number]   Final    Colony Count >=100,000 COLONIES/ML   Final    Culture YEAST   Final     Report Status 06/21/2011 FINAL   Final   CULTURE, BLOOD (ROUTINE X 2)     Status: Normal (Preliminary result)   Collection Time   06/20/11 12:20 PM      Component Value Range Status Comment   Specimen Description BLOOD LEFT HAND   Final    Special Requests BOTTLES DRAWN AEROBIC AND ANAEROBIC [facility] EACH   Final    Culture  Setup Time [id number]   Final    Culture     Final    Value:        BLOOD CULTURE RECEIVED NO GROWTH TO DATE CULTURE WILL BE HELD FOR 5 DAYS BEFORE ISSUING A FINAL NEGATIVE REPORT   Report Status PENDING   Incomplete   CULTURE, BLOOD (ROUTINE X 2)     Status: Normal (Preliminary result)   Collection Time   06/20/11 12:30 PM      Component Value Range Status Comment   Specimen Description BLOOD LEFT ARM   Final    Special Requests BOTTLES DRAWN AEROBIC AND ANAEROBIC [facility] EACH   Final    Culture  Setup Time [id number]   Final    Culture     Final    Value:        BLOOD CULTURE RECEIVED NO GROWTH TO DATE CULTURE WILL BE HELD FOR 5 DAYS BEFORE ISSUING A FINAL NEGATIVE REPORT   Report Status PENDING   Incomplete   CLOSTRIDIUM DIFFICILE BY PCR     Status: Normal   Collection Time   06/22/11  1:31 PM      Component  Value Range Status Comment   C difficile by pcr NEGATIVE  NEGATIVE  Final     Studies/Results: US Renal  06/22/2011  *RADIOLOGY REPORT*  Clinical Data: Acute renal failure  RENAL/URINARY TRACT ULTRASOUND COMPLETE  Comparison:  CT 06/20/2011  Findings:  Right Kidney:  11.1 cm in length.  Several small cysts but no large cyst or mass.  There is an extrarenal pelvis but no true hydronephrosis.  Left Kidney:  11.4 cm in length.  Several small cysts including 8- 0.0 x 1.1 x 1.5 cm cyst of the upper medial cortex.  No hydronephrosis or mass lesion.  Bladder:  Foley catheter in place.  IMPRESSION: No evidence of renal obstruction.   A 2 cm upper pole cyst of the left kidney.  Numerous other smaller cysts bilaterally.  Extrarenal pelvis on the right  without evidence of true  hydronephrosis.  Original Report Authenticated By: Thomasenia Sales, M.D.   Dg Chest Port 1 View  06/23/2011  *RADIOLOGY REPORT*  Clinical Data: Cough.  Shortness of breath.  Congestive heart failure.  PORTABLE CHEST - 1 VIEW  Comparison: 06/22/2011  Findings: Low lung volumes are present, causing crowding of the pulmonary vasculature.  The interstitial edema shown on the prior exam has considerably improved.  There is continued obscuration of the left hemidiaphragm.  Dense mitral annular calcification noted.  Thoracic spondylosis noted.  IMPRESSION:  1.  Pulmonary venous hypertension noted.  The interstitial edema shown on the prior exam is significantly improved. 2.  Mild cardiomegaly with mitral annular calcification. 3.  Low lung volumes. 4.  Thoracic spondylosis. 5.  Indistinct airspace opacity in the left lower lobe, potentially confluent edema, pneumonia, or atelectasis.  Original Report Authenticated By: Dellia Cloud, M.D.   Dg Chest Port 1 View  06/22/2011  *RADIOLOGY REPORT*  Clinical Data: Shortness of breath.  Question congestive heart failure.  PORTABLE CHEST - 1 VIEW  Comparison: Plain films of the chest 12/01/2010, 07/03/2010 and 07/02/2009.  Findings: There is cardiomegaly with interstitial pulmonary edema. Hazy opacities over the chest bilaterally are compatible with layering pleural effusions.  Calcified mitral annulus is noted.  IMPRESSION: Cardiomegaly and pulmonary edema with bilateral pleural effusions.  Original Report Authenticated By: Bernadene Bell. Maricela Curet, M.D.    Medications: Scheduled Meds:    . albumin human  12.5 g Intravenous Once  . ALPRAZolam  0.25 mg Oral QHS  . aspirin EC  81 mg Oral QPM  . enoxaparin (LOVENOX) injection  30 mg Subcutaneous Q24H  . fluconazole  100 mg Oral Q48H  . fluticasone  1 spray Each Nare Daily  . furosemide  80 mg Intravenous Once  . insulin aspart  0-20 Units Subcutaneous Q4H  . insulin lispro protamine-insulin lispro  20 Units  Subcutaneous BID AC  . metoprolol tartrate  12.5 mg Oral BID  . piperacillin-tazobactam (ZOSYN)  IV  2.25 g Intravenous Q8H  . saccharomyces boulardii  250 mg Oral BID  . Tamsulosin HCl  0.4 mg Oral QPC breakfast  . DISCONTD: albuterol-ipratropium  2 puff Inhalation Q6H   Continuous Infusions:    . sodium chloride 10 mL/hr at 06/24/11 0702  . nitroGLYCERIN 10 mcg/min (06/24/11 0702)   PRN Meds:.acetaminophen, acetaminophen, albuterol, albuterol-ipratropium, alum & mag hydroxide-simeth, HYDROcodone-acetaminophen, ondansetron (ZOFRAN) IV, ondansetron, traMADol   Assessment:  Acute combined CHF Patient is on nitroglycerin drip. Per cardiology. Diuresis was been very difficult with acute renal sufficiency both cardiology and nephrology following. Patient is on Lasix. The net output since admission  is close to 4 L. Diuresis was been successful thanks to nephrology team. Continue the metoprolol, nitroglycerin drip and Lasix. Await cards and nephrology recommendation.   Acute renal failure on CKD stage III Baseline creatinine 1. 4-1.6. Per nephrology notes suspect a primary etiology is contrast-induced nephropathy, with contributing etiologies including CHF exacerbation. Likely ATN in plateau phase filling status better with the diuresis continue checking renal function. Dr. Allena Katz is following.  Hyponatremia Likely multifactorial secondary to acute renal failure, CHF and diuresis. Hanging around 120. I hope that will improve after diuresis. Baseline 130  UTI Candiduria, patient on fluconazole. Also has Foley catheter.  COPD exacerbation versus community-acquired pneumonia Patient started on Zosyn and vancomycin. Been discontinued after this is been attributed to fluid overload and congestive heart failure.  Disposition family is considering palliative images of the no improvement over the course of the next 2-3 days   LOS: 4 days   [name] A 06/24/2011, 10:39 AM

## 2011-06-24 NOTE — Progress Notes (Signed)
Subjective:  Nauseated but less shortness of breath.  Objective:  Vital Signs in the last 24 hours: Temp:  [97.7 F (36.5 C)-98.5 F (36.9 C)] 98.4 F (36.9 C) (02/06 1500) Pulse Rate:  [78-88] 88  (02/06 1500) Cardiac Rhythm:  [-] Normal sinus rhythm;Other (Comment) (02/06 1030) Resp:  [18-26] 20  (02/06 1500) BP: (118-144)/(66-78) 121/66 mmHg (02/06 1500) SpO2:  [95 %-100 %] 98 % (02/06 1500) Weight:  [70.4 kg (155 lb 3.3 oz)] 70.4 kg (155 lb 3.3 oz) (02/06 0657)  Physical Exam: BP Readings from Last 1 Encounters:  06/24/11 121/66    Wt Readings from Last 1 Encounters:  06/24/11 70.4 kg (155 lb 3.3 oz)    Weight change: -1.2 kg (-2 lb 10.3 oz)  HEENT: [place]/AT, Eyes-Brown, PERL, EOMI, Conjunctiva-Pink, Sclera-Non-icteric Neck: Full JVD, No bruit, Trachea midline. Lungs:  Clearing with few basal crackles, Bilateral. Cardiac:  Regular rhythm, normal S1 and S2, no S3. II/VI systolic murmur Abdomen:  Soft, non-tender. Extremities:  No edema present. No cyanosis. No clubbing. CNS: AxOx3, Cranial nerves grossly intact, moves all 4 extremities. Right handed. Skin: Warm and dry.   Intake/Output from previous day: 02/05 0701 - 02/06 0700 In: 324.1 [I.V.:124.1; IV Piggyback:200] Out: 3100 [Urine:3100]    Lab Results: BMET    Component Value Date/Time   NA 120* 06/24/2011 0400   K 4.4 06/24/2011 0400   CL 82* 06/24/2011 0400   CO2 20 06/24/2011 0400   GLUCOSE 208* 06/24/2011 0400   BUN 80* 06/24/2011 0400   CREATININE 4.47* 06/24/2011 0400   CALCIUM 7.8* 06/24/2011 0400   GFRNONAA 8* 06/24/2011 0400   GFRAA 10* 06/24/2011 0400   CBC    Component Value Date/Time   WBC 6.9 06/24/2011 0400   WBC 9.1 08/23/2009 0819   RBC 2.65* 06/24/2011 0400   RBC 4.42 08/23/2009 0819   HGB 7.3* 06/24/2011 0400   HGB 12.6 08/23/2009 0819   HCT 21.0* 06/24/2011 0400   HCT 38.4 08/23/2009 0819   PLT 34* 06/24/2011 0400   PLT 243 08/23/2009 0819   MCV 79.2 06/24/2011 0400   MCV 86.8 08/23/2009 0819   MCH 27.5 06/24/2011 0400     MCH 28.4 08/23/2009 0819   MCHC 34.8 06/24/2011 0400   MCHC 32.7 08/23/2009 0819   RDW 15.4 06/24/2011 0400   RDW 16.1* 08/23/2009 0819   LYMPHSABS 0.8 06/20/2011 1000   LYMPHSABS 1.4 08/23/2009 0819   MONOABS 1.0 06/20/2011 1000   MONOABS 0.7 08/23/2009 0819   EOSABS 0.3 06/20/2011 1000   EOSABS 0.3 08/23/2009 0819   BASOSABS 0.0 06/20/2011 1000   BASOSABS 0.0 08/23/2009 0819   CARDIAC ENZYMES Lab Results  Component Value Date   CKTOTAL 79 06/21/2011   CKMB 4.0 06/21/2011   TROPONINI 0.80* 06/21/2011    Assessment/Plan:  Patient Active Hospital Problem List:  Biventricular failure  Stable with IV NTG -change to NTG patch Pyelonephritis  On antibiotic  Diabetes Mellitus,II  Improving control  Hypertension  Stable  Hyponatremia  Acute Renal Failure-ATN.  Appreciate renal consult.  Try to match I & O.  Anemia, iron deficiency and chronic disease  Transfuse one unit PRBC Discussed with patient and daughter to accept DNR and comfort care.       LOS: 4 days    Orpah Cobb  MD  06/24/2011, 5:47 PM

## 2011-06-25 DIAGNOSIS — E871 Hypo-osmolality and hyponatremia: Secondary | ICD-10-CM | POA: Diagnosis present

## 2011-06-25 DIAGNOSIS — D696 Thrombocytopenia, unspecified: Secondary | ICD-10-CM

## 2011-06-25 DIAGNOSIS — N179 Acute kidney failure, unspecified: Secondary | ICD-10-CM | POA: Diagnosis present

## 2011-06-25 DIAGNOSIS — I5041 Acute combined systolic (congestive) and diastolic (congestive) heart failure: Secondary | ICD-10-CM | POA: Diagnosis present

## 2011-06-25 DIAGNOSIS — J811 Chronic pulmonary edema: Secondary | ICD-10-CM | POA: Diagnosis present

## 2011-06-25 LAB — GLUCOSE, CAPILLARY
Glucose-Capillary: 134 mg/dL — ABNORMAL HIGH (ref 70–99)
Glucose-Capillary: 196 mg/dL — ABNORMAL HIGH (ref 70–99)
Glucose-Capillary: 393 mg/dL — ABNORMAL HIGH (ref 70–99)
Glucose-Capillary: 414 mg/dL — ABNORMAL HIGH (ref 70–99)
Glucose-Capillary: 411 mg/dL — ABNORMAL HIGH (ref 70–99)

## 2011-06-25 LAB — RENAL FUNCTION PANEL
CO2: 21 mEq/L (ref 19–32)
Calcium: 7.6 mg/dL — ABNORMAL LOW (ref 8.4–10.5)
Chloride: 88 mEq/L — ABNORMAL LOW (ref 96–112)
GFR calc Af Amer: 13 mL/min — ABNORMAL LOW (ref >90)
GFR calc non Af Amer: 11 mL/min — ABNORMAL LOW (ref >90)
Potassium: 3.8 mEq/L (ref 3.5–5.1)
Sodium: 125 mEq/L — ABNORMAL LOW (ref 135–145)

## 2011-06-25 LAB — IRON AND TIBC: TIBC: 303 ug/dL (ref 250–470)

## 2011-06-25 LAB — CBC
HCT: 24.2 % — ABNORMAL LOW (ref 36.0–46.0)
Hemoglobin: 8 g/dL — ABNORMAL LOW (ref 12.0–15.0)
MCH: 26.4 pg (ref 26.0–34.0)
MCHC: 33.1 g/dL (ref 30.0–36.0)
MCV: 79.9 fL (ref 78.0–100.0)

## 2011-06-25 LAB — FERRITIN: Ferritin: 474 ng/mL — ABNORMAL HIGH (ref 10–291)

## 2011-06-25 LAB — PATHOLOGIST SMEAR REVIEW

## 2011-06-25 LAB — DIC (DISSEMINATED INTRAVASCULAR COAGULATION)PANEL
Fibrinogen: 552 mg/dL — ABNORMAL HIGH (ref 204–475)
Prothrombin Time: 17.6 seconds — ABNORMAL HIGH (ref 11.6–15.2)

## 2011-06-25 MED ORDER — AZITHROMYCIN 500 MG IV SOLR
500.0000 mg | INTRAVENOUS | Status: DC
  Administered 2011-06-25 – 2011-06-26 (×2): 500 mg via INTRAVENOUS
  Filled 2011-06-25 (×2): qty 500

## 2011-06-25 MED ORDER — FERUMOXYTOL INJECTION 510 MG/17 ML
510.0000 mg | Freq: Once | INTRAVENOUS | Status: AC
  Administered 2011-06-25: 510 mg via INTRAVENOUS
  Filled 2011-06-25: qty 17

## 2011-06-25 MED ORDER — FUROSEMIDE 40 MG PO TABS
40.0000 mg | ORAL_TABLET | Freq: Two times a day (BID) | ORAL | Status: DC
  Administered 2011-06-25 – 2011-06-26 (×2): 40 mg via ORAL
  Filled 2011-06-25 (×3): qty 1

## 2011-06-25 MED ORDER — DEXTROSE 5 % IV SOLN
1.0000 g | INTRAVENOUS | Status: DC
  Administered 2011-06-25 – 2011-06-27 (×3): 1 g via INTRAVENOUS
  Filled 2011-06-25 (×4): qty 10

## 2011-06-25 MED ORDER — POTASSIUM CHLORIDE CRYS ER 10 MEQ PO TBCR
10.0000 meq | EXTENDED_RELEASE_TABLET | Freq: Once | ORAL | Status: DC
  Filled 2011-06-25: qty 1

## 2011-06-25 NOTE — Progress Notes (Signed)
Spoke with dtr at bedside. Patient sleeping. Patient markedly improved per dtr. Discussed d/c plan for [facility] RN, she feels this would be a good idea. She lives with patient and provides full time support with meals, meds, transportation, etc. Will continue to follow.

## 2011-06-25 NOTE — Progress Notes (Signed)
S: Urine output has remained excellent; rec'd dose of lasix IV each of last 2 days; patient has been sleepy today - daughter thinks it may be from tramadol; also pt said getting blood last PM "kept her awake" Denies SOB or pain O: Medications: (I have reviewed today) Infusions:    . DISCONTD: sodium chloride 50 mL/hr at 06/24/11 1039  . DISCONTD: dextrose 5 % and 0.45% NaCl 20 mL/hr at 06/24/11 1808  . DISCONTD: nitroGLYCERIN 10 mcg/min (06/24/11 0702)  . DISCONTD: nitroGLYCERIN 5 mcg/min (06/24/11 1800)   Scheduled Medications:    . ALPRAZolam  0.25 mg Oral QHS  . aspirin EC  81 mg Oral QPM  . azithromycin  500 mg Intravenous Q24H  . cefTRIAXone (ROCEPHIN)  IV  1 g Intravenous Q24H  . fluconazole  100 mg Oral Q48H  . fluticasone  1 spray Each Nare Daily  . furosemide  80 mg Intravenous Once  . insulin aspart  0-20 Units Subcutaneous Q4H  . insulin lispro protamine-insulin lispro  20 Units Subcutaneous BID AC  . metoprolol tartrate  12.5 mg Oral BID  . nitroGLYCERIN  0.4 mg Transdermal Daily  . saccharomyces boulardii  250 mg Oral BID  . DISCONTD: enoxaparin (LOVENOX) injection  30 mg Subcutaneous Q24H  . DISCONTD: piperacillin-tazobactam (ZOSYN)  IV  2.25 g Intravenous Q8H  . DISCONTD: Tamsulosin HCl  0.4 mg Oral QPC breakfast   PRN Meds:.acetaminophen, acetaminophen, albuterol, albuterol-ipratropium, HYDROcodone-acetaminophen, ondansetron (ZOFRAN) IV, ondansetron, traMADol, DISCONTD: alum & mag hydroxide-simeth  BP 116/72  Pulse 71  Temp(Src) 97.3 F (36.3 C) (Oral)  Resp 20  Ht 5\' 3"  (1.6 m)  Wt 68.4 kg (150 lb 12.7 oz)  BMI 26.71 kg/m2  SpO2 100%   Intake/Output Summary (Last 24 hours) at 06/25/11 1331 Last data filed at 06/25/11 0830  Gross per 24 hour  Intake 1073.33 ml  Output   3371 ml  Net -2297.67 ml    Weight change: -2 kg (-4 lb 6.6 oz)  EXAM:  Appears very comfortable on 2 liters [place] Gen:NAD Light skinned BF.  No increased WOB CVS:S1S2 1-2/6  murmur USB Resp:Clear ant; posteriorly coarse with crackles, but less than yesterday ZOX:WRUE without tenderness AVW:UJWJX pretib edema Foley - clear urin in the bag  Labs: Basic Metabolic Panel:  Lab 06/25/11 9147 06/24/11 0400 06/23/11 0428 06/20/11 2250  NA 125* 120* 123* --  K 3.8 4.4 4.1 --  CL 88* 82* 84* --  CO2 21 20 20  --  GLUCOSE 116* 208* 115* --  BUN 79* 80* 78* --  CREATININE 3.59* 4.47* 4.95* --  CALCIUM 7.6* 7.8* 7.6* --  MG -- -- -- 2.2  PHOS 6.6* 8.0* -- --    Liver Function Tests:  Lab 06/25/11 0555 06/24/11 0400 06/20/11 2250 06/20/11 1000  AST -- -- 20 19  ALT -- -- 22 19  ALKPHOS -- -- 73 71  BILITOT -- -- 0.6 0.7  PROT -- -- 6.8 7.2  ALBUMIN 2.6* 2.4* 2.5* --    Lab 06/20/11 2250 06/20/11 1000  LIPASE 67* 88*  AMYLASE -- --   No results found for this basename: AMMONIA:3 in the last 168 hours  CBC:  Lab 06/25/11 1150 06/25/11 0555 06/24/11 0400 06/23/11 0428 06/21/11 0711 06/20/11 1000  WBC -- 8.9 6.9 7.1 -- --  NEUTROABS -- -- -- -- -- 11.0*  HGB -- 8.0* 7.3* 7.2* -- --  HCT -- 24.2* 21.0* 21.6* -- --  MCV -- 79.9 79.2 80.3 85.6 83.2  PLT 52* 48* 34* -- -- --   Iron Studies:  Basename 06/25/11 0555  IRON 51  TIBC 303  FERRITIN 474*  Transferrin saturation 17%   Assessment/Plan:  1 Acute renal failure on chronic kidney disease stage III (baseline creatinine around 1.4-1.6, estimated GFR 30-35 mL per minute): Suspect the predominating etiology was contrast-induced nephropathy from recent CT abdomen/pelvis with contrast with 100 mL of iodinated contrast. Possible contributing etiologies include CHF exacerbation with relative renal hypoperfusion and followed by RAS activation from diuretic therapy. She is in the recovery phase now.  UOP last 2 days excellent with a single 80 mg dose of lasix past 2 days.  Could go back on her usual outpatient dose (recorded as 40 po bid) at this point and have ordered this.  Little else to add as expect in  absence of further insults she will return to previous baseline.    2. Hyponatremia: This is due to acute renal failure and free water excretion defect. From admission labs, it appears that she may have stable and compensated hyponatremia at baseline at around 130 mEq a liter and she has improved up to 125 today.   3. Congestive heart failure exacerbation: Off the nitro drip.  Improving volume status with diuresis. Could go back to outpt diuretic dose as per #1.  4. Urinary tract infection: Candiduria noted-the patient is on fluconazole.  5. COPD exacerbation versus community-acquired pneumonia: now on rocephin/azithro 6. Anemia - s/p 1 unit PRBC's.  Hb up to 8.  Tsat only 17%  Will order dose of Feraheme.    Further Rx per primary service. 7. Other issues/dispo per primary service.  Will sign off the case but call back if additional questions arise.  [name] B

## 2011-06-25 NOTE — Progress Notes (Signed)
Subjective: Her daughter Steward Drone at bedside. Reports feeling better, denies any shortness of breath on 2 L of oxygen same as she is at home.   Objective: Vital signs in last 24 hours: Filed Vitals:   06/25/11 0141 06/25/11 0241 06/25/11 0349 06/25/11 0619  BP: 97/57 90/48 102/64 116/72  Pulse: 69 65 69 71  Temp: 97.5 F (36.4 C) 97.7 F (36.5 C) 98 F (36.7 C) 97.3 F (36.3 C)  TempSrc: Oral Axillary Axillary Oral  Resp: 20 18 20 20   Height:      Weight:    68.4 kg (150 lb 12.7 oz)  SpO2:    100%    Intake/Output Summary (Last 24 hours) at 06/25/11 1054 Last data filed at 06/25/11 0830  Gross per 24 hour  Intake 1073.33 ml  Output   3371 ml  Net -2297.67 ml    Weight change: -2 kg (-4 lb 6.6 oz)  Weight change:  ZOX:WRUEAVWUJWJ resting in bed-laying flat on her back  XBJ:YNWGN RRR, normal S1 and S2  Resp:Coarse BS with bibasal fine rales  FAO:ZHYQ, NT, BS normal  Ext:No LE edema   Lab Results: Results for orders placed during the hospital encounter of 06/20/11 (from the past 24 hour(s))  GLUCOSE, CAPILLARY     Status: Abnormal   Collection Time   06/24/11 11:55 AM      Component Value Range   Glucose-Capillary 225 (*) 70 - 99 (mg/dL)  GLUCOSE, CAPILLARY     Status: Abnormal   Collection Time   06/24/11  4:35 PM      Component Value Range   Glucose-Capillary 249 (*) 70 - 99 (mg/dL)   Comment 1 Documented in Chart     Comment 2 Notify RN    PREPARE RBC (CROSSMATCH)     Status: Normal   Collection Time   06/24/11  6:00 PM      Component Value Range   Order Confirmation ORDER PROCESSED BY BLOOD BANK    TYPE AND SCREEN     Status: Normal (Preliminary result)   Collection Time   06/24/11  6:16 PM      Component Value Range   ABO/RH(D) A POS     Antibody Screen NEG     Sample Expiration 06/27/2011     Unit Number [id number]     Blood Component Type RED CELLS,LR     Unit division 00     Status of Unit ISSUED     Transfusion Status OK TO TRANSFUSE     Crossmatch  Result Compatible    ABO/RH     Status: Normal   Collection Time   06/24/11  7:00 PM      Component Value Range   ABO/RH(D) A POS    GLUCOSE, CAPILLARY     Status: Abnormal   Collection Time   06/24/11  8:05 PM      Component Value Range   Glucose-Capillary 279 (*) 70 - 99 (mg/dL)  GLUCOSE, CAPILLARY     Status: Abnormal   Collection Time   06/24/11 11:54 PM      Component Value Range   Glucose-Capillary 140 (*) 70 - 99 (mg/dL)   Comment 1 Documented in Chart     Comment 2 Notify RN    GLUCOSE, CAPILLARY     Status: Abnormal   Collection Time   06/25/11  3:54 AM      Component Value Range   Glucose-Capillary 134 (*) 70 - 99 (mg/dL)  CBC  Status: Abnormal   Collection Time   06/25/11  5:55 AM      Component Value Range   WBC 8.9  4.0 - 10.5 (K/uL)   RBC 3.03 (*) 3.87 - 5.11 (MIL/uL)   Hemoglobin 8.0 (*) 12.0 - 15.0 (g/dL)   HCT 82.9 (*) 56.2 - 46.0 (%)   MCV 79.9  78.0 - 100.0 (fL)   MCH 26.4  26.0 - 34.0 (pg)   MCHC 33.1  30.0 - 36.0 (g/dL)   RDW 13.0  86.5 - 78.4 (%)   Platelets 48 (*) 150 - 400 (K/uL)  RENAL FUNCTION PANEL     Status: Abnormal   Collection Time   06/25/11  5:55 AM      Component Value Range   Sodium 125 (*) 135 - 145 (mEq/L)   Potassium 3.8  3.5 - 5.1 (mEq/L)   Chloride 88 (*) 96 - 112 (mEq/L)   CO2 21  19 - 32 (mEq/L)   Glucose, Bld 116 (*) 70 - 99 (mg/dL)   BUN 79 (*) 6 - 23 (mg/dL)   Creatinine, Ser 6.96 (*) 0.50 - 1.10 (mg/dL)   Calcium 7.6 (*) 8.4 - 10.5 (mg/dL)   Phosphorus 6.6 (*) 2.3 - 4.6 (mg/dL)   Albumin 2.6 (*) 3.5 - 5.2 (g/dL)   GFR calc non Af Amer 11 (*) >90 (mL/min)   GFR calc Af Amer 13 (*) >90 (mL/min)  GLUCOSE, CAPILLARY     Status: Abnormal   Collection Time   06/25/11  7:44 AM      Component Value Range   Glucose-Capillary 109 (*) 70 - 99 (mg/dL)   Comment 1 Notify RN       Micro: Recent Results (from the past 240 hour(s))  WET PREP, GENITAL     Status: Abnormal   Collection Time   06/20/11 10:30 AM      Component Value  Range Status Comment   Yeast Wet Prep HPF POC FEW (*) NONE SEEN  Final    Trich, Wet Prep NONE SEEN  NONE SEEN  Final    Clue Cells Wet Prep HPF POC FEW (*) NONE SEEN  Final    WBC, Wet Prep HPF POC MODERATE (*) NONE SEEN  Final   URINE CULTURE     Status: Normal   Collection Time   06/20/11 12:06 PM      Component Value Range Status Comment   Specimen Description URINE, CLEAN CATCH   Final    Special Requests NONE   Final    Culture  Setup Time [id number]   Final    Colony Count >=100,000 COLONIES/ML   Final    Culture YEAST   Final    Report Status 06/21/2011 FINAL   Final   CULTURE, BLOOD (ROUTINE X 2)     Status: Normal (Preliminary result)   Collection Time   06/20/11 12:20 PM      Component Value Range Status Comment   Specimen Description BLOOD LEFT HAND   Final    Special Requests BOTTLES DRAWN AEROBIC AND ANAEROBIC [facility] EACH   Final    Culture  Setup Time [id number]   Final    Culture     Final    Value:        BLOOD CULTURE RECEIVED NO GROWTH TO DATE CULTURE WILL BE HELD FOR 5 DAYS BEFORE ISSUING A FINAL NEGATIVE REPORT   Report Status PENDING   Incomplete   CULTURE, BLOOD (ROUTINE X 2)     Status:  Normal (Preliminary result)   Collection Time   06/20/11 12:30 PM      Component Value Range Status Comment   Specimen Description BLOOD LEFT ARM   Final    Special Requests BOTTLES DRAWN AEROBIC AND ANAEROBIC [facility] EACH   Final    Culture  Setup Time [id number]   Final    Culture     Final    Value:        BLOOD CULTURE RECEIVED NO GROWTH TO DATE CULTURE WILL BE HELD FOR 5 DAYS BEFORE ISSUING A FINAL NEGATIVE REPORT   Report Status PENDING   Incomplete   CLOSTRIDIUM DIFFICILE BY PCR     Status: Normal   Collection Time   06/22/11  1:31 PM      Component Value Range Status Comment   C difficile by pcr NEGATIVE  NEGATIVE  Final     Studies/Results: No results found.  Medications: Scheduled Meds:    . ALPRAZolam  0.25 mg Oral QHS  . aspirin EC  81 mg Oral QPM  .  enoxaparin (LOVENOX) injection  30 mg Subcutaneous Q24H  . fluconazole  100 mg Oral Q48H  . fluticasone  1 spray Each Nare Daily  . furosemide  80 mg Intravenous Once  . insulin aspart  0-20 Units Subcutaneous Q4H  . insulin lispro protamine-insulin lispro  20 Units Subcutaneous BID AC  . metoprolol tartrate  12.5 mg Oral BID  . nitroGLYCERIN  0.4 mg Transdermal Daily  . piperacillin-tazobactam (ZOSYN)  IV  2.25 g Intravenous Q8H  . saccharomyces boulardii  250 mg Oral BID  . DISCONTD: Tamsulosin HCl  0.4 mg Oral QPC breakfast   Continuous Infusions:    . sodium chloride 50 mL/hr at 06/24/11 1039  . dextrose 5 % and 0.45% NaCl 20 mL/hr at 06/24/11 1808  . DISCONTD: nitroGLYCERIN 10 mcg/min (06/24/11 0702)  . DISCONTD: nitroGLYCERIN 5 mcg/min (06/24/11 1800)   PRN Meds:.acetaminophen, acetaminophen, albuterol, albuterol-ipratropium, HYDROcodone-acetaminophen, ondansetron (ZOFRAN) IV, ondansetron, traMADol, DISCONTD: alum & mag hydroxide-simeth   Assessment:  Patient Active Problem List  Diagnoses Date Noted  . Acute renal failure 06/25/2011  . CKD (chronic kidney disease), stage III 06/25/2011  . Thrombocytopenia 06/25/2011  . Pulmonary edema 06/25/2011  . Acute combined systolic and diastolic congestive heart failure 06/25/2011  . Hyponatremia 06/25/2011  . Candiduria 06/20/2011  . Diabetes mellitus 06/20/2011  . Hypertension 06/20/2011    Acute combined CHF Patient is on nitroglycerin drip. Per cardiology. Diuresis was been very difficult with acute renal sufficiency both cardiology and nephrology following. Patient is on Lasix. The net output since admission is close to 4 L. Diuresis was been successful thanks to nephrology team. Continue the metoprolol, nitroglycerin drip and Lasix. Await cards and nephrology recommendation.   Thrombocytopenia There is moderate decline in her platelets. His time of admission platelets were more than 121. Last summer it was more than 200.  The number declined of 234 yesterday and 248 this morning. Patient is on Zosyn this can cause of platelets. I'll check for DIC panel, check peripheral smear for schistocytes, and consult hematology. No evidence of bleeding for now.   Acute renal failure on CKD stage III Baseline creatinine 1. 4-1.6. Per nephrology notes suspect a primary etiology is contrast-induced nephropathy, with contributing etiologies including CHF exacerbation. Likely ATN in plateau phase filling status better with the diuresis continue checking renal function.  renal function is improving with better creatinine. Good urine output with a Lasix.  Hyponatremia Likely multifactorial secondary  to acute renal failure, CHF and diuresis. Hanging around 120. I hope that will improve after diuresis. Baseline 130  UTI Candiduria, patient on fluconazole. Also has Foley catheter.  COPD exacerbation versus community-acquired pneumonia Patient started on Zosyn and vancomycin. Been discontinued after this is been attributed to fluid overload and congestive heart failure.  Disposition family is considering palliative images of the no improvement over the course of the next 2-3 days   LOS: 5 days   [name] A 06/25/2011, 10:54 AM

## 2011-06-25 NOTE — Progress Notes (Signed)
Subjective:  Occasional confusion. No chest pain or shortness of breath. Afebrile. Hgb-8.0, Na-125, K+ 3.8 from 5.7 in 3 days  Objective:  Vital Signs in the last 24 hours: Temp:  [97.3 F (36.3 C)-98 F (36.7 C)] 97.6 F (36.4 C) (02/07 1555) Pulse Rate:  [65-87] 72  (02/07 1355) Cardiac Rhythm:  [-] Normal sinus rhythm;Other (Comment) (02/07 0830) Resp:  [18-24] 20  (02/07 1355) BP: (90-137)/(48-84) 137/74 mmHg (02/07 1355) SpO2:  [97 %-100 %] 97 % (02/07 1355) Weight:  [68.4 kg (150 lb 12.7 oz)] 68.4 kg (150 lb 12.7 oz) (02/07 1610)  Physical Exam: BP Readings from Last 1 Encounters:  06/25/11 137/74    Wt Readings from Last 1 Encounters:  06/25/11 68.4 kg (150 lb 12.7 oz)    Weight change: -2 kg (-4 lb 6.6 oz)  HEENT: [place]/AT, Eyes-Brown, PERL, EOMI, Conjunctiva-Pink, Sclera-Non-icteric Neck: No JVD, No bruit, Trachea midline. Lungs:  Clear, Bilateral. Cardiac:  Regular rhythm, normal S1 and S2, no S3.  Abdomen:  Soft, non-tender. Extremities:  No edema present. No cyanosis. No clubbing. CNS: AxOx3, Cranial nerves grossly intact, moves all 4 extremities. Right handed. Skin: Warm and dry.   Intake/Output from previous day: 02/06 0701 - 02/07 0700 In: 917.4 [P.O.:360; I.V.:64.1; Blood:333.3; IV Piggyback:160] Out: 4596 [Urine:4595; Stool:1]    Lab Results: BMET    Component Value Date/Time   NA 125* 06/25/2011 0555   K 3.8 06/25/2011 0555   CL 88* 06/25/2011 0555   CO2 21 06/25/2011 0555   GLUCOSE 116* 06/25/2011 0555   BUN 79* 06/25/2011 0555   CREATININE 3.59* 06/25/2011 0555   CALCIUM 7.6* 06/25/2011 0555   GFRNONAA 11* 06/25/2011 0555   GFRAA 13* 06/25/2011 0555   CBC    Component Value Date/Time   WBC 8.9 06/25/2011 0555   WBC 9.1 08/23/2009 0819   RBC 3.03* 06/25/2011 0555   RBC 4.42 08/23/2009 0819   HGB 8.0* 06/25/2011 0555   HGB 12.6 08/23/2009 0819   HCT 24.2* 06/25/2011 0555   HCT 38.4 08/23/2009 0819   PLT 52* 06/25/2011 1150   PLT 243 08/23/2009 0819   MCV 79.9 06/25/2011 0555     MCV 86.8 08/23/2009 0819   MCH 26.4 06/25/2011 0555   MCH 28.4 08/23/2009 0819   MCHC 33.1 06/25/2011 0555   MCHC 32.7 08/23/2009 0819   RDW 15.2 06/25/2011 0555   RDW 16.1* 08/23/2009 0819   LYMPHSABS 0.8 06/20/2011 1000   LYMPHSABS 1.4 08/23/2009 0819   MONOABS 1.0 06/20/2011 1000   MONOABS 0.7 08/23/2009 0819   EOSABS 0.3 06/20/2011 1000   EOSABS 0.3 08/23/2009 0819   BASOSABS 0.0 06/20/2011 1000   BASOSABS 0.0 08/23/2009 0819   CARDIAC ENZYMES Lab Results  Component Value Date   CKTOTAL 79 06/21/2011   CKMB 4.0 06/21/2011   TROPONINI 0.80* 06/21/2011    Assessment/Plan:  Patient Active Hospital Problem List:  Biventricular failure  Stable with NTG patch and lasix Pyelonephritis  On antibiotic  Diabetes Mellitus,II  Improving control  Hypertension  Stable  Hyponatremia  Acute Renal Failure-ATN.  Appreciate renal consult.  Try to match I & O.  Anemia, iron deficiency and chronic disease  Transfuse one unit PRBC  Dementia     LOS: 5 days    Orpah Cobb  MD  06/25/2011, 5:58 PM

## 2011-06-25 NOTE — Consult Note (Signed)
[facility] CONSULTATION NOTE  Reason for Consult: Thrombocytopenia   [name] is an 78 y.o. female patient of Dr. Cyndie Chime advanced cardiac disease, intitially seen  in consultation when she was hospitalized back in 06/2007 after a diagnosis of  acute pulmonary embolism on 06/09/2007.  She turned out to be extremely sensitive to Coumadin with very lose doses causing profound coagulopathy.  This in turn led to an upper GI bleed.  We were able to eventually get her on a stable low dose of Coumadin.  She only required 1 mg daily except for Mondays and Fridays, when she took 0.5 mg.   She was anticoagulated for one year.  Unfortunately, her medical condition has been unstable,and required further hospitalizations secondary to heart failure. In 07/02/2009 dopplers  showed a chronic deep vein and superficial thrombosis.  She was again anticoagulated, and put back on the Coumadin.Status complicated as an OP on 07/13/2009 with an upper GI bleed.  Hemoglobin fell from baseline of 11.8 on 02/15 down to 8.1.  Maximum INR recorded was 3.0 on 02/23.  Coumadin was again reversed.  She again required blood transfusions to a stable hemoglobin.  A decision was made to place a vena cava filter, which was done on 07/19/2009 by Dr. Waverly Ferrari.  She is currently off all anticoagulation.  She was last seen on April of 2011, at which time, other than symptoms of fatigue,no active bleeding that her family has been aware of.  Her hemoglobin today was back up to her baseline at 12.6.  Platelet count 243,000.   She had 3 more hospitalizations since, two with heart failure, and last to this present with pneumonia.  She was now admitted with acute on chronic renal failure, exacerbation of CHF, UTI and diarrhea with negative C diff. In addition, suspected COPD exacerbation vs. CAP was noted.  We were informed of the patient's admission, to help in the management of her thrombocytopenia in the  setting of these active problems.Platelets on 2/4were 121, dropping to 4 on 2/6, 48 on 2/7 and slowly increasing to 52k today. No apparent acute bleed..  Trhoughout the hospitalization pt has been  given  Vancomycin , Gentamycin, Rocephin and Zithromax, Diflucan  per pharmacy. She did receive Lovenox prophylaxis 30 mg as well, last dose on 2/6. Tech review shows no schistocytes.PT/INR is 17.6 and 1.42, PTT 36   PMH: Past Medical History  Diagnosis Date  . Diabetes mellitus   . Hypertension   . Vascular disease   . Shortness of breath     Surgeries: Past Surgical History  Procedure Date  . Bladder tack   . Femoral artery stent     Allergies:  Allergies  Allergen Reactions  . Avelox (Moxifloxacin Hcl In Nacl)   . Coumadin Other (See Comments)    Causes severe bleeding  . Proton Pump Inhibitors     Medications:  Prior to Admission:  Prescriptions prior to admission  Medication Sig Dispense Refill  . albuterol-ipratropium (COMBIVENT) 18-103 MCG/ACT inhaler Inhale 2 puffs into the lungs every 6 (six) hours as needed. For shortness of breath      . allopurinol (ZYLOPRIM) 100 MG tablet Take 100 mg by mouth at bedtime.      . ALPRAZolam (XANAX) 0.25 MG tablet Take 0.25 mg by mouth at bedtime.      . Ascorbic Acid (VITAMIN C) 100 MG tablet Take 100 mg by mouth daily.      Marland Kitchen aspirin EC 81 MG  tablet Take 81 mg by mouth every evening.      Marland Kitchen atorvastatin (LIPITOR) 20 MG tablet Take 20 mg by mouth at bedtime.      . B Complex-C (B-COMPLEX WITH VITAMIN C) tablet Take 1 tablet by mouth daily.      . Calcium-Magnesium-Zinc 1000-400-15 MG TABS Take 1 tablet by mouth daily.      . Chlorphen-Pseudoephed-APAP (CORICIDIN D) 2-30-325 MG TABS Take 1 tablet by mouth daily as needed. For cough and chest congestion      . Cinnamon 500 MG capsule Take 1,000 mg by mouth daily.      . digoxin ([name]) 0.125 MG tablet Take 125 mcg by mouth every other day.      . famotidine (PEPCID) 20 MG tablet Take  20 mg by mouth at bedtime.      . furosemide (LASIX) 40 MG tablet Take 40 mg by mouth 2 (two) times daily.      . insulin NPH-insulin regular (NOVOLIN 50/50) (50-50) 100 UNIT/ML injection Inject 55-80 Units into the skin 2 (two) times daily before a meal. 80 units in the am and 55 units in the pm      . Iron-Vitamins (GERITOL COMPLETE PO) Take 2 tablets by mouth daily.      . isosorbide mononitrate (IMDUR) 30 MG 24 hr tablet Take 15 mg by mouth daily.      . metoprolol tartrate (LOPRESSOR) 25 MG tablet Take 12.5 mg by mouth 2 (two) times daily.      . mineral oil liquid Take 5 mLs by mouth daily as needed. For constipation      . mometasone (NASONEX) 50 MCG/ACT nasal spray Place 2 sprays into the nose at bedtime as needed. For congestion      . Multiple Vitamins-Minerals (ICAPS) CAPS Take 1 capsule by mouth 2 (two) times daily.      Marland Kitchen omega-3 acid ethyl esters (LOVAZA) 1 G capsule Take 2 g by mouth daily.      . phenazopyridine (PYRIDIUM) 200 MG tablet Take 200 mg by mouth every 12 (twelve) hours.      . potassium chloride (K-DUR) 10 MEQ tablet Take 10 mEq by mouth daily.      . sodium chloride (OCEAN) 0.65 % nasal spray Place 1 spray into the nose at bedtime as needed. For congestion        ZOX:[id number], acetaminophen, albuterol, albuterol-ipratropium, HYDROcodone-acetaminophen, ondansetron (ZOFRAN) IV, ondansetron, traMADol  ROS: Constitutional: Positive for weight loss. Negative for fever, chills and positive for  malaise/fatigue.  Eyes: Negative for blurred vision and double vision.  Respiratory: Positive for cough on admission  and SOB, now improved. Negative for hemoptysis, sputum production and wheezing.  Cardiovascular: Positive for CP on admission. Negative for palpitations, claudication and leg swelling.  Gastrointestinal: Positive for nausea, abdominal discomfort, appears improved. Negative for heartburn, vomiting, diarrhea, constipation, blood in stool and melena.    Genitourinary: Positive for urgency on admission Negative for hematuria and flank pain.  Musculoskeletal: Positive for back pain since admission, positional Negative for myalgias, joint pain and falls.    All other systems reviewed and are negative  Family History:  No family history on file.  Social History:  reports that she has never smoked. She does not have any smokeless tobacco history on file. She reports that she does not drink alcohol or use illicit drugs.  Physical Exam  78 year old  in no acute distress lethargic, falls back asleep. General well-developed and well-nourished  HEENT: Normocephalic, atraumatic,  PERRLA. Oral cavity without thrush or lesions. Neck supple. no thyromegaly, no cervical or supraclavicular adenopathy  Lungs  No wheezing, rhonchi. Few  Rales at bases . No axillary masses. Breasts: not examined. Cardiac regular rate and rhythm normal S1-S2, no murmur , rubs or gallops Abdomen soft nontender , bowel sounds x4. No HSM GU/rectal: deferred. Extremities no clubbing cyanosis or edema. No bruising or petechial rash     Labs:  CBC   Lab 06/25/11 1150 06/25/11 0555 06/24/11 0400 06/23/11 0428 06/21/11 0711 06/20/11 1000  WBC -- 8.9 6.9 7.1 8.6 13.1*  HGB -- 8.0* 7.3* 7.2* 9.0* 9.3*  HCT -- 24.2* 21.0* 21.6* 28.6* 28.8*  PLT 52* 48* 34* PLATELETS APPEAR DECREASED 121* --  MCV -- 79.9 79.2 80.3 85.6 83.2  MCH -- 26.4 27.5 26.8 26.9 26.9  MCHC -- 33.1 34.8 33.3 31.5 32.3  RDW -- 15.2 15.4 15.3 15.8* 16.4*  LYMPHSABS -- -- -- -- -- 0.8  MONOABS -- -- -- -- -- 1.0  EOSABS -- -- -- -- -- 0.3  BASOSABS -- -- -- -- -- 0.0  BANDABS -- -- -- -- -- --     Anemia panel:   Basename 06/25/11 0555  VITAMINB12 --  FOLATE --  FERRITIN 474*  TIBC 303  IRON 51  RETICCTPCT --    No results found for this basename: TSH,T4TOTAL,FREET3,T3FREE,THYROIDAB in the last 72 hours      Component Value Date/Time   ESRSEDRATE 32* 06/14/2007 0925       CMP    Lab 06/25/11 0555 06/24/11 0400 06/23/11 0428 06/22/11 0502 06/21/11 0710 06/20/11 2250 06/20/11 1000  NA 125* 120* 123* 120* -- 125* --  K 3.8 4.4 4.1 4.8 4.4 -- --  CL 88* 82* 84* 85* -- 92* --  CO2 21 20 20 20  -- 20 --  GLUCOSE 116* 208* 115* 205* -- 478* --  BUN 79* 80* 78* 63* -- 44* --  CREATININE 3.59* 4.47* 4.95* 4.61* -- 1.81* --  CALCIUM 7.6* 7.8* 7.6* 7.8* -- 8.5 --  MG -- -- -- -- -- 2.2 --  AST -- -- -- -- -- 20 19  ALT -- -- -- -- -- 22 19  ALKPHOS -- -- -- -- -- 73 71  BILITOT -- -- -- -- -- 0.6 0.7        Component Value Date/Time   BILITOT 0.6 06/20/2011 2250       Lab 06/25/11 1150  INR 1.42  PROTIME --     Basename 06/25/11 1150  DDIMER 2.48*      Imaging Studies: Ct Abdomen Pelvis W Contrast  06/20/2011 *RADIOLOGY REPORT* Clinical Data: Lower abdominal and pelvic pain and pressure. Leukocytosis. CT ABDOMEN AND PELVIS WITH CONTRAST Technique: Multidetector CT imaging of the abdomen and pelvis was performed following the standard protocol during bolus administration of intravenous contrast. Contrast: OMNIPAQUE IOHEXOL 300 MG/ML IV SOLN Comparison: 08/13/2004 Findings: Increased cardiomegaly is seen since previous study. Diffuse bibasilar air space opacity is seen as well as a tiny right pleural effusion. These findings are consistent with congestive heart failure. The liver, gallbladder, pancreas, spleen, and adrenal glands are normal in appearance. IVC filter is seen in place. Renal cysts and parenchymal scarring again seen bilaterally. Right extrarenal pelvis noted, without evidence of caliectasis or ureterectasis. Mild diffuse bladder wall thickening and mucosal enhancement is again noted, which may be seen with neurogenic bladder, cystitis, or chronic bladder outlet obstruction. Previous hysterectomy noted. Severe diverticulosis seen involving the  descending and sigmoid colon, however there is no evidence of diverticulitis. No other  inflammatory process or abnormal fluid collections are identified. No soft tissue masses or lymphadenopathy identified within the abdomen or pelvis. IMPRESSION: 1. Increased cardiomegaly, bibasilar airspace disease, and tiny right pleural effusion, consistent with congestive heart failure. 2. No acute findings within the abdomen or pelvis. 3. Mild diffuse bladder wall thickening and enhancement. Differential diagnosis includes neurogenic bladder, cystitis, or chronic bladder outlet obstruction. 3. Severe sigmoid diverticulosis. No radiographic evidence of diverticulitis. Original Report Authenticated By: Danae Orleans, M.D.   [facility] Pulmonary Per & Vent  06/20/2011 *RADIOLOGY REPORT* Clinical Data: Congestive heart failure. Pulmonary embolus and January 2009. IVC filter in place. Shortness of breath. Elevated D-dimer. NM PULMONARY VENTILATION AND PERFUSION SCAN Radiopharmaceutical: 8.6 mCi xenon 133 inhaled. 4.3 mCi technetium 14m MAA IV. Comparison: Chest x-ray dated 06/20/2011 Findings: The patient has chronic elevation of the right hemidiaphragm. The ventilation study is otherwise normal. The perfusion study is normal. IMPRESSION: Normal ventilation perfusion lung scan. No evidence of pulmonary embolism. Original Report Authenticated By: Gwynn Burly, M.D.        A/P: 78 y.o. female with known thrombocytopenia in the setting of acuta and chronic problems, multiple hospitalizations, recent infections and polypharmacy. No acute bleeding  We were informed of the patient's admission, and Dr.Glenola Wheat for Dr. Cyndie Chime who is out of office at this time is to see the patient following this consult. Thank you for the referral.  [facility] 06/25/2011 3:42 PM  Patient seen and examined this evening.  I spoke with patient's daughter, Steward Drone.  Peripheral blood smear reviewed.  There are some large platelets and moderated poikilocytosis.  No platelet clumping seen.  Hopefully, platelet count has reached a  plateau and we will see improvement in the days ahead.  Possible etiologies include drugs eg Vancomycin, mild DIC and immune dysregulation.  Of note was the platelet count of 121k on 2/3, the day after admission.  Plat count on 12/03/10 was 186k.  I doubt we are dealing with TTP or HIT.  Recommend conservative observation for now.  Agree with stopping all suspect and nonessential medicines.  Will follow.  Will check LDH.  No interventions necessary now.   Page me at [phone] for any questions or concerns.  Samul Dada 06/25/2011 9:28 PM

## 2011-06-25 NOTE — Progress Notes (Signed)
ANTIBIOTIC CONSULT NOTE - FOLLOW UP  Pharmacy Consult for Fluconazole Indication: UTI  Allergies  Allergen Reactions  . Avelox (Moxifloxacin Hcl In Nacl)   . Coumadin Other (See Comments)    Causes severe bleeding  . Proton Pump Inhibitors     Patient Measurements: Height: 5\' 3"  (160 cm) Weight: 150 lb 12.7 oz (68.4 kg) IBW/kg (Calculated) : 52.4   Vital Signs: Temp: 97.6 F (36.4 C) (02/07 1355) Temp src: Oral (02/07 1355) BP: 137/74 mmHg (02/07 1355) Pulse Rate: 72  (02/07 1355) Intake/Output from previous day: 02/06 0701 - 02/07 0700 In: 917.4 [P.O.:360; I.V.:64.1; Blood:333.3; IV Piggyback:160] Out: 4596 [Urine:4595; Stool:1] Intake/Output from this shift: Total I/O In: 700 [P.O.:440; I.V.:260] Out: 650 [Urine:650]  Labs:  [facility] 06/25/11 1150 06/25/11 0555 06/24/11 0400 06/23/11 0428 06/23/11 0011 06/23/11 0008  WBC -- 8.9 6.9 7.1 -- --  HGB -- 8.0* 7.3* 7.2* -- --  PLT 52* 48* 34* -- -- --  LABCREA -- -- -- -- 55.3 50.77  CREATININE -- 3.59* 4.47* 4.95* -- --   Estimated Creatinine Clearance: 11 ml/min (by C-G formula based on Cr of 3.59). No results found for this basename: VANCOTROUGH:2,VANCOPEAK:2,VANCORANDOM:2,GENTTROUGH:2,GENTPEAK:2,GENTRANDOM:2,TOBRATROUGH:2,TOBRAPEAK:2,TOBRARND:2,AMIKACINPEAK:2,AMIKACINTROU:2,AMIKACIN:2, in the last 72 hours   Microbiology: Recent Results (from the past 720 hour(s))  WET PREP, GENITAL     Status: Abnormal   Collection Time   06/20/11 10:30 AM      Component Value Range Status Comment   Yeast Wet Prep HPF POC FEW (*) NONE SEEN  Final    Trich, Wet Prep NONE SEEN  NONE SEEN  Final    Clue Cells Wet Prep HPF POC FEW (*) NONE SEEN  Final    WBC, Wet Prep HPF POC MODERATE (*) NONE SEEN  Final   URINE CULTURE     Status: Normal   Collection Time   06/20/11 12:06 PM      Component Value Range Status Comment   Specimen Description URINE, CLEAN CATCH   Final    Special Requests NONE   Final    Culture  Setup Time  [id number]   Final    Colony Count >=100,000 COLONIES/ML   Final    Culture YEAST   Final    Report Status 06/21/2011 FINAL   Final   CULTURE, BLOOD (ROUTINE X 2)     Status: Normal (Preliminary result)   Collection Time   06/20/11 12:20 PM      Component Value Range Status Comment   Specimen Description BLOOD LEFT HAND   Final    Special Requests BOTTLES DRAWN AEROBIC AND ANAEROBIC [facility] EACH   Final    Culture  Setup Time [id number]   Final    Culture     Final    Value:        BLOOD CULTURE RECEIVED NO GROWTH TO DATE CULTURE WILL BE HELD FOR 5 DAYS BEFORE ISSUING A FINAL NEGATIVE REPORT   Report Status PENDING   Incomplete   CULTURE, BLOOD (ROUTINE X 2)     Status: Normal (Preliminary result)   Collection Time   06/20/11 12:30 PM      Component Value Range Status Comment   Specimen Description BLOOD LEFT ARM   Final    Special Requests BOTTLES DRAWN AEROBIC AND ANAEROBIC [facility]   Final    Culture  Setup Time [id number]   Final    Culture     Final    Value:        BLOOD CULTURE  RECEIVED NO GROWTH TO DATE CULTURE WILL BE HELD FOR 5 DAYS BEFORE ISSUING A FINAL NEGATIVE REPORT   Report Status PENDING   Incomplete   CLOSTRIDIUM DIFFICILE BY PCR     Status: Normal   Collection Time   06/22/11  1:31 PM      Component Value Range Status Comment   C difficile by pcr NEGATIVE  NEGATIVE  Final     Anti-infectives     Start     Dose/Rate Route Frequency Ordered Stop   06/25/11 1200   cefTRIAXone (ROCEPHIN) 1 g in dextrose 5 % 50 mL IVPB        1 g 100 mL/hr over 30 Minutes Intravenous Every 24 hours 06/25/11 1108     06/25/11 1200   azithromycin (ZITHROMAX) 500 mg in dextrose 5 % 250 mL IVPB        500 mg 250 mL/hr over 60 Minutes Intravenous Every 24 hours 06/25/11 1108     06/23/11 1000   fluconazole (DIFLUCAN) tablet 100 mg        100 mg Oral Every 48 hours 06/22/11 0850     06/22/11 1400   piperacillin-tazobactam (ZOSYN) IVPB 2.25 g  Status:  Discontinued        2.25 g 100  mL/hr over 30 Minutes Intravenous 3 times per day 06/22/11 0851 06/25/11 1108   06/21/11 1000   fluconazole (DIFLUCAN) tablet 100 mg  Status:  Discontinued        100 mg Oral Daily 06/20/11 1500 06/22/11 0849   06/20/11 2200   piperacillin-tazobactam (ZOSYN) IVPB 3.375 g  Status:  Discontinued        3.375 g 12.5 mL/hr over 240 Minutes Intravenous 3 times per day 06/20/11 1516 06/22/11 0850   06/20/11 1530   vancomycin (VANCOCIN) 750 mg in sodium chloride 0.9 % 150 mL IVPB  Status:  Discontinued        750 mg 150 mL/hr over 60 Minutes Intravenous Every 24 hours 06/20/11 1516 06/21/11 1232   06/20/11 1500  piperacillin-tazobactam (ZOSYN) IVPB 3.375 g       3.375 g 100 mL/hr over 30 Minutes Intravenous  Once 06/20/11 1458 06/20/11 1603   06/20/11 1230   cefTRIAXone (ROCEPHIN) 1 g in dextrose 5 % 50 mL IVPB  Status:  Discontinued        1 g 100 mL/hr over 30 Minutes Intravenous Every 24 hours 06/20/11 1221 06/20/11 1434   06/20/11 1000   cefTRIAXone (ROCEPHIN) 1 g in dextrose 5 % 50 mL IVPB        1 g 100 mL/hr over 30 Minutes Intravenous  Once 06/20/11 0955 06/20/11 1103   06/20/11 1000   fluconazole (DIFLUCAN) tablet 150 mg        150 mg Oral  Once 06/20/11 0955 06/20/11 1033          Assessment: Day # 6 fluconazole (started 2/2) for UTI collected 2/2 with 100K yeast.. SCr continues to trend down, current CrCl est 11 ml/min. Current dose of fluconazole 100mg  PO q48h appropriate for renal fxn.  Plan:  No changes to current regimen  [name] [name] 06/25/2011,2:51 PM

## 2011-06-26 ENCOUNTER — Other Ambulatory Visit: Payer: Self-pay

## 2011-06-26 LAB — GLUCOSE, CAPILLARY
Glucose-Capillary: 96 mg/dL (ref 70–99)
Glucose-Capillary: 184 mg/dL — ABNORMAL HIGH (ref 70–99)
Glucose-Capillary: 223 mg/dL — ABNORMAL HIGH (ref 70–99)
Glucose-Capillary: 247 mg/dL — ABNORMAL HIGH (ref 70–99)

## 2011-06-26 LAB — TYPE AND SCREEN

## 2011-06-26 LAB — RENAL FUNCTION PANEL
Albumin: 2.6 g/dL — ABNORMAL LOW (ref 3.5–5.2)
CO2: 20 mEq/L (ref 19–32)
Calcium: 8 mg/dL — ABNORMAL LOW (ref 8.4–10.5)
Creatinine, Ser: 3.12 mg/dL — ABNORMAL HIGH (ref 0.50–1.10)
GFR calc Af Amer: 15 mL/min — ABNORMAL LOW (ref >90)
GFR calc non Af Amer: 13 mL/min — ABNORMAL LOW (ref >90)
Phosphorus: 6.3 mg/dL — ABNORMAL HIGH (ref 2.3–4.6)
Sodium: 125 mEq/L — ABNORMAL LOW (ref 135–145)

## 2011-06-26 LAB — CULTURE, BLOOD (ROUTINE X 2)
Culture  Setup Time: 201302021824
Culture: NO GROWTH

## 2011-06-26 LAB — CBC
Hemoglobin: 8.6 g/dL — ABNORMAL LOW (ref 12.0–15.0)
MCH: 27.7 pg (ref 26.0–34.0)
MCHC: 34.3 g/dL (ref 30.0–36.0)
Platelets: 73 10*3/uL — ABNORMAL LOW (ref 150–400)
RDW: 15.4 % (ref 11.5–15.5)

## 2011-06-26 LAB — LACTATE DEHYDROGENASE: LDH: 506 U/L — ABNORMAL HIGH (ref 94–250)

## 2011-06-26 MED ORDER — SODIUM CHLORIDE 0.9 % IV SOLN
Freq: Once | INTRAVENOUS | Status: AC
  Administered 2011-06-26: 16:00:00 via INTRAVENOUS

## 2011-06-26 MED ORDER — NITROGLYCERIN 0.2 MG/HR TD PT24
0.2000 mg | MEDICATED_PATCH | Freq: Every day | TRANSDERMAL | Status: DC
  Administered 2011-06-27 – 2011-06-28 (×2): 0.2 mg via TRANSDERMAL
  Filled 2011-06-26 (×4): qty 1

## 2011-06-26 MED ORDER — SODIUM CHLORIDE 0.9 % IV SOLN
Freq: Once | INTRAVENOUS | Status: AC
  Administered 2011-06-26: 15:00:00 via INTRAVENOUS

## 2011-06-26 NOTE — Progress Notes (Signed)
Subjective: Her daughter Steward Drone at bedside. . Feels tired after she worked with PT and OT. No specific complaints   Objective: Vital signs in last 24 hours: Filed Vitals:   06/25/11 1555 06/25/11 2300 06/26/11 0500 06/26/11 0518  BP:  123/67  116/72  Pulse:  71  59  Temp: 97.6 F (36.4 C) 98.4 F (36.9 C)  97.6 F (36.4 C)  TempSrc: Axillary Oral  Oral  Resp:  18  18  Height:      Weight:   68.1 kg (150 lb 2.1 oz)   SpO2:  99%  100%    Intake/Output Summary (Last 24 hours) at 06/26/11 1151 Last data filed at 06/26/11 0001  Gross per 24 hour  Intake    680 ml  Output   2220 ml  Net  -1540 ml    Weight change: -0.3 kg (-10.6 oz)  Weight change:  EAV:[id number] resting in bed-laying flat on her back  GNF:AOZHY RRR, normal S1 and S2  Resp:Coarse BS with bibasal fine rales  QMV:HQIO, NT, BS normal  Ext:No LE edema   Lab Results: Results for orders placed during the hospital encounter of 06/20/11 (from the past 24 hour(s))  GLUCOSE, CAPILLARY     Status: Abnormal   Collection Time   06/25/11  4:05 PM      Component Value Range   Glucose-Capillary 414 (*) 70 - 99 (mg/dL)  GLUCOSE, CAPILLARY     Status: Abnormal   Collection Time   06/25/11  4:12 PM      Component Value Range   Glucose-Capillary 393 (*) 70 - 99 (mg/dL)  GLUCOSE, CAPILLARY     Status: Abnormal   Collection Time   06/25/11  8:05 PM      Component Value Range   Glucose-Capillary 411 (*) 70 - 99 (mg/dL)  GLUCOSE, CAPILLARY     Status: Abnormal   Collection Time   06/25/11 11:55 PM      Component Value Range   Glucose-Capillary 231 (*) 70 - 99 (mg/dL)   Comment 1 Documented in Chart     Comment 2 Notify RN    GLUCOSE, CAPILLARY     Status: Abnormal   Collection Time   06/26/11  4:11 AM      Component Value Range   Glucose-Capillary 136 (*) 70 - 99 (mg/dL)  RENAL FUNCTION PANEL     Status: Abnormal   Collection Time   06/26/11  4:29 AM      Component Value Range   Sodium 125 (*) 135 - 145 (mEq/L)   Potassium 4.1  3.5 - 5.1 (mEq/L)   Chloride 88 (*) 96 - 112 (mEq/L)   CO2 20  19 - 32 (mEq/L)   Glucose, Bld 139 (*) 70 - 99 (mg/dL)   BUN 78 (*) 6 - 23 (mg/dL)   Creatinine, Ser 9.62 (*) 0.50 - 1.10 (mg/dL)   Calcium 8.0 (*) 8.4 - 10.5 (mg/dL)   Phosphorus 6.3 (*) 2.3 - 4.6 (mg/dL)   Albumin 2.6 (*) 3.5 - 5.2 (g/dL)   GFR calc non Af Amer 13 (*) >90 (mL/min)   GFR calc Af Amer 15 (*) >90 (mL/min)  CBC     Status: Abnormal   Collection Time   06/26/11  4:29 AM      Component Value Range   WBC 11.8 (*) 4.0 - 10.5 (K/uL)   RBC 3.10 (*) 3.87 - 5.11 (MIL/uL)   Hemoglobin 8.6 (*) 12.0 - 15.0 (g/dL)   HCT 95.2 (*)  36.0 - 46.0 (%)   MCV 81.0  78.0 - 100.0 (fL)   MCH 27.7  26.0 - 34.0 (pg)   MCHC 34.3  30.0 - 36.0 (g/dL)   RDW 16.1  09.6 - 04.5 (%)   Platelets 73 (*) 150 - 400 (K/uL)  LACTATE DEHYDROGENASE     Status: Abnormal   Collection Time   06/26/11  4:29 AM      Component Value Range   LD 506 (*) 94 - 250 (U/L)  GLUCOSE, CAPILLARY     Status: Abnormal   Collection Time   06/26/11  8:00 AM      Component Value Range   Glucose-Capillary 104 (*) 70 - 99 (mg/dL)     Micro: Recent Results (from the past 240 hour(s))  WET PREP, GENITAL     Status: Abnormal   Collection Time   06/20/11 10:30 AM      Component Value Range Status Comment   Yeast Wet Prep HPF POC FEW (*) NONE SEEN  Final    Trich, Wet Prep NONE SEEN  NONE SEEN  Final    Clue Cells Wet Prep HPF POC FEW (*) NONE SEEN  Final    WBC, Wet Prep HPF POC MODERATE (*) NONE SEEN  Final   URINE CULTURE     Status: Normal   Collection Time   06/20/11 12:06 PM      Component Value Range Status Comment   Specimen Description URINE, CLEAN CATCH   Final    Special Requests NONE   Final    Culture  Setup Time [id number]   Final    Colony Count >=100,000 COLONIES/ML   Final    Culture YEAST   Final    Report Status 06/21/2011 FINAL   Final   CULTURE, BLOOD (ROUTINE X 2)     Status: Normal   Collection Time   06/20/11 12:20 PM       Component Value Range Status Comment   Specimen Description BLOOD LEFT HAND   Final    Special Requests BOTTLES DRAWN AEROBIC AND ANAEROBIC [facility]   Final    Culture  Setup Time [id number]   Final    Culture NO GROWTH 5 DAYS   Final    Report Status 06/26/2011 FINAL   Final   CULTURE, BLOOD (ROUTINE X 2)     Status: Normal   Collection Time   06/20/11 12:30 PM      Component Value Range Status Comment   Specimen Description BLOOD LEFT ARM   Final    Special Requests BOTTLES DRAWN AEROBIC AND ANAEROBIC [facility]   Final    Culture  Setup Time [id number]   Final    Culture NO GROWTH 5 DAYS   Final    Report Status 06/26/2011 FINAL   Final   CLOSTRIDIUM DIFFICILE BY PCR     Status: Normal   Collection Time   06/22/11  1:31 PM      Component Value Range Status Comment   C difficile by pcr NEGATIVE  NEGATIVE  Final     Studies/Results: No results found.  Medications: Scheduled Meds:    . ALPRAZolam  0.25 mg Oral QHS  . aspirin EC  81 mg Oral QPM  . azithromycin  500 mg Intravenous Q24H  . cefTRIAXone (ROCEPHIN)  IV  1 g Intravenous Q24H  . ferumoxytol  510 mg Intravenous Once  . fluconazole  100 mg Oral Q48H  . fluticasone  1 spray Each Nare  Daily  . furosemide  40 mg Oral BID  . insulin aspart  0-20 Units Subcutaneous Q4H  . insulin lispro protamine-insulin lispro  20 Units Subcutaneous BID AC  . metoprolol tartrate  12.5 mg Oral BID  . nitroGLYCERIN  0.4 mg Transdermal Daily  . saccharomyces boulardii  250 mg Oral BID  . DISCONTD: potassium chloride  10 mEq Oral Once   Continuous Infusions:    . DISCONTD: sodium chloride 50 mL/hr at 06/24/11 1039  . DISCONTD: dextrose 5 % and 0.45% NaCl 20 mL/hr at 06/24/11 1808   PRN Meds:.acetaminophen, acetaminophen, albuterol, albuterol-ipratropium, HYDROcodone-acetaminophen, ondansetron (ZOFRAN) IV, ondansetron, traMADol   Assessment:  Patient Active Problem List  Diagnoses Date Noted  . Acute renal failure 06/25/2011  .  CKD (chronic kidney disease), stage III 06/25/2011  . Thrombocytopenia 06/25/2011  . Pulmonary edema 06/25/2011  . Acute combined systolic and diastolic congestive heart failure 06/25/2011  . Hyponatremia 06/25/2011  . Candiduria 06/20/2011  . Diabetes mellitus 06/20/2011  . Hypertension 06/20/2011    Acute combined CHF Patient is on nitroglycerin drip. Per cardiology. Diuresis was been very difficult with acute renal sufficiency both cardiology and nephrology following. Patient is on Lasix 40 mg by mouth twice a day and now the nitroglycerin drip was discontinued. She feels okay back to her requirement of oxygen at 2 L.   Thrombocytopenia There is moderate decline in her platelets. His time of admission platelets were more than 121. Last summer it was more than 200. Patient evaluated by hematology and recommended for close followup no need for intervention for now, this is likely cumulative effects of drugs and acute illness.  Acute renal failure on CKD stage III Baseline creatinine 1. 4-1.6. Per nephrology notes suspect a primary etiology is contrast-induced nephropathy, with contributing etiologies including CHF exacerbation. Likely ATN in plateau phase filling status better with the diuresis continue checking renal function.  renal function is improving with better creatinine. Good urine output with a Lasix.  Hyponatremia Likely multifactorial secondary to acute renal failure, CHF and diuresis. Hanging around 120. I hope that will improve after diuresis. Baseline 130  UTI Candiduria, patient on fluconazole. Also has Foley catheter.  COPD exacerbation versus community-acquired pneumonia Patient started on Zosyn and vancomycin. Been discontinued after this is been attributed to fluid overload and congestive heart failure.  Disposition Likely to be discharged in 1-2 days if the creatinine continues to improve and the platelets stable.   LOS: 6 days   Claudia Alvizo A 06/26/2011, 11:51  AM

## 2011-06-26 NOTE — Progress Notes (Signed)
Provided support to pt's daughters during rapid response and code event.  Will continue to follow and assess for needs.  Please page as needs arise.     06/26/11 1400  Clinical Encounter Type  Visited With Family;Patient and family together  Visit Type Code;Spiritual support;Initial  Referral From Nurse;Other (Comment) (Rapid response / Code)  Consult/Referral To Chaplain  Stress Factors  Family Stress Factors Major life changes;Health changes

## 2011-06-26 NOTE — Evaluation (Signed)
Physical Therapy Evaluation Patient Details Name: [name] MRN: [id number] DOB: [birth year] Today's Date: 06/26/2011  Problem List:  Patient Active Problem List  Diagnoses  . Candiduria  . Diabetes mellitus  . Hypertension  . Acute renal failure  . CKD (chronic kidney disease), stage III  . Thrombocytopenia  . Pulmonary edema  . Acute combined systolic and diastolic congestive heart failure  . Hyponatremia    Past Medical History:  Past Medical History  Diagnosis Date  . Diabetes mellitus   . Hypertension   . Vascular disease   . Shortness of breath    Past Surgical History:  Past Surgical History  Procedure Date  . Bladder tack   . Femoral artery stent     PT Assessment/Plan/Recommendation PT Assessment Clinical Impression Statement: Pt presents with diagnosis heart failure, pyelonephritis. Pt will benefit from skilled PT in the acute care setting to maximize strength and activity tolerance in preperation for D/C home with daughter-daughter confirms plan is for home.  PT Recommendation/Assessment: Patient will need skilled PT in the acute care venue PT Problem List: Decreased strength;Decreased activity tolerance;Decreased mobility;Decreased cognition;Decreased safety awareness Barriers to Discharge: Decreased caregiver support Barriers to Discharge Comments: Daughter works. PT Therapy Diagnosis : Difficulty walking;Generalized weakness PT Plan PT Frequency: Min 3X/week PT Treatment/Interventions: DME instruction;Gait training;Functional mobility training;Therapeutic activities;Therapeutic exercise;Patient/family education PT Recommendation Follow Up Recommendations: Home health PT;Supervision/Assistance - 24 hour Equipment Recommended: None recommended by PT PT Goals  Acute Rehab PT Goals PT Goal Formulation: With patient/family Time For Goal Achievement: 7 days Pt will go Supine/Side to Sit: with supervision PT Goal: Supine/Side to Sit - Progress: Goal set  today Pt will go Sit to Supine/Side: with supervision PT Goal: Sit to Supine/Side - Progress: Goal set today Pt will go Sit to Stand: with supervision PT Goal: Sit to Stand - Progress: Goal set today Pt will go Stand to Sit: with supervision PT Goal: Stand to Sit - Progress: Goal set today Pt will Ambulate: 16 - 50 feet;with supervision;with least restrictive assistive device PT Goal: Ambulate - Progress: Goal set today Pt will Perform Home Exercise Program: with supervision, verbal cues required/provided PT Goal: Perform Home Exercise Program - Progress: Goal set today  PT Evaluation Precautions/Restrictions  Precautions Precautions: Fall Restrictions Weight Bearing Restrictions: No Prior Functioning  Home Living Lives With: Daughter Receives Help From: Other (Comment);Friend(s) (daughter) Type of Home: House Home Layout: One level Home Access: Stairs to enter Entrance Stairs-Rails: Right Entrance Stairs-Number of Steps: 5 Bathroom Shower/Tub: Tub/shower unit;Curtain Bathroom Toilet: Standard Bathroom Accessibility: No Home Adaptive Equipment: Tub transfer bench;Bedside commode/3-in-1;Walker - four wheeled;Other (comment);Wheelchair - manual (home O2) Prior Function Level of Independence: Needs assistance with ADLs Bath: Moderate Dressing: Supervision/set-up Driving: No Cognition Cognition Arousal/Alertness: Awake/alert Overall Cognitive Status: History of cognitive impairments History of Cognitive Impairment: Appears at baseline functioning Orientation Level: Oriented to person;Oriented to place;Oriented to situation Sensation/Coordination Sensation Light Touch: Appears Intact Coordination Gross Motor Movements are Fluid and Coordinated: Yes Extremity Assessment RUE Assessment RUE Assessment: Within Functional Limits LUE Assessment LUE Assessment: Within Functional Limits RLE Assessment RLE Assessment: Within Functional Limits LLE Assessment LLE Assessment:  Within Functional Limits Mobility (including Balance) Bed Mobility Bed Mobility: Yes Supine to Sit: 3: Mod assist Supine to Sit Details (indicate cue type and reason): MAX encouragment from daughter for pt to participate. Increased assistance to initiate task. Assist for trunk to upright and LEs off bed.  Transfers Transfers: Yes Sit to Stand: 4: Min assist;From bed;From chair/3-in-1 Sit  to Stand Details (indicate cue type and reason): VCs safety, technique, hand placement. Assist to rise, stabilize.  Stand to Sit: 4: Min assist;To bed;To chair/3-in-1 Stand to Sit Details: VCs safety, technique, hand placement. Assist to control descent.  Ambulation/Gait Ambulation/Gait: Yes Ambulation/Gait Assistance: 4: Min assist Ambulation/Gait Assistance Details (indicate cue type and reason): VCs safety, technique, Assist to stabilize and negotiate with RW.  Ambulation Distance (Feet): 15 Feet. Pt only agreeable to ambulate minimally with PT. Assistive device: Rolling walker Gait Pattern: Step-through pattern;Trunk flexed;Decreased step length - right;Decreased stride length  Posture/Postural Control Posture/Postural Control: No significant limitations Exercise    End of Session PT - End of Session Equipment Utilized During Treatment: Gait belt Activity Tolerance: Patient limited by fatigue Patient left: in bed;with call bell in reach;with family/visitor present General Behavior During Session: [facility] for tasks performed Cognition: [facility] for tasks performed  Rebeca Alert [place] 06/26/2011, 11:45 AM [phone]

## 2011-06-26 NOTE — Progress Notes (Signed)
Subjective:  Hypotensive, dizzy post activity.  Objective:  Vital Signs in the last 24 hours: Temp:  [97.4 F (36.3 C)-98.4 F (36.9 C)] 97.4 F (36.3 C) (02/08 1445) Pulse Rate:  [58-71] 58  (02/08 1445) Cardiac Rhythm:  [-] Normal sinus rhythm;Other (Comment) (02/08 0825) Resp:  [16-18] 16  (02/08 1445) BP: (95-123)/(60-72) 95/60 mmHg (02/08 1445) SpO2:  [90 %-100 %] 90 % (02/08 1445) Weight:  [68.1 kg (150 lb 2.1 oz)] 68.1 kg (150 lb 2.1 oz) (02/08 0500)  Physical Exam: BP Readings from Last 1 Encounters:  06/26/11 95/60    Wt Readings from Last 1 Encounters:  06/26/11 68.1 kg (150 lb 2.1 oz)    Weight change: -0.3 kg (-10.6 oz)  HEENT: [place]/AT, Eyes-Brown, PERL, EOMI, Conjunctiva-Pink, Sclera-Non-icteric Neck: No JVD, No bruit, Trachea midline. Lungs:  Clear, Bilateral. Cardiac:  Regular rhythm, normal S1 and S2, no S3.  Abdomen:  Soft, non-tender. Extremities:  No edema present. No cyanosis. No clubbing. CNS: AxOx3, Cranial nerves grossly intact, moves all 4 extremities. Right handed. Skin: Warm and dry.   Intake/Output from previous day: 02/07 0701 - 02/08 0700 In: 1140 [P.O.:580; I.V.:260; IV Piggyback:300] Out: 2220 [Urine:2220]    Lab Results: BMET    Component Value Date/Time   NA 125* 06/26/2011 0429   K 4.1 06/26/2011 0429   CL 88* 06/26/2011 0429   CO2 20 06/26/2011 0429   GLUCOSE 139* 06/26/2011 0429   BUN 78* 06/26/2011 0429   CREATININE 3.12* 06/26/2011 0429   CALCIUM 8.0* 06/26/2011 0429   GFRNONAA 13* 06/26/2011 0429   GFRAA 15* 06/26/2011 0429   CBC    Component Value Date/Time   WBC 11.8* 06/26/2011 0429   WBC 9.1 08/23/2009 0819   RBC 3.10* 06/26/2011 0429   RBC 4.42 08/23/2009 0819   HGB 8.6* 06/26/2011 0429   HGB 12.6 08/23/2009 0819   HCT 25.1* 06/26/2011 0429   HCT 38.4 08/23/2009 0819   PLT 73* 06/26/2011 0429   PLT 243 08/23/2009 0819   MCV 81.0 06/26/2011 0429   MCV 86.8 08/23/2009 0819   MCH 27.7 06/26/2011 0429   MCH 28.4 08/23/2009 0819   MCHC 34.3 06/26/2011 0429   MCHC 32.7 08/23/2009 0819   RDW 15.4 06/26/2011 0429   RDW 16.1* 08/23/2009 0819   LYMPHSABS 0.8 06/20/2011 1000   LYMPHSABS 1.4 08/23/2009 0819   MONOABS 1.0 06/20/2011 1000   MONOABS 0.7 08/23/2009 0819   EOSABS 0.3 06/20/2011 1000   EOSABS 0.3 08/23/2009 0819   BASOSABS 0.0 06/20/2011 1000   BASOSABS 0.0 08/23/2009 0819   CARDIAC ENZYMES Lab Results  Component Value Date   CKTOTAL 79 06/21/2011   CKMB 4.0 06/21/2011   TROPONINI 0.80* 06/21/2011    Assessment/Plan:  Patient Active Hospital Problem List:  Biventricular failure  Hypotension/dehydration  IV fluids Pyelonephritis   On antibiotic  Diabetes Mellitus,II   Improving control  Hypertension   Stable  Hyponatremia  Acute Renal Failure-ATN.  Appreciate renal consult.   Continue I & O.  Anemia, iron deficiency and chronic disease  Dementia    LOS: 6 days    Orpah Cobb  MD  06/26/2011, 3:05 PM

## 2011-06-26 NOTE — Evaluation (Signed)
Occupational Therapy Evaluation Patient Details Name: [name] MRN: [id number] DOB: Mar 23, 1926 Today's Date: 06/26/2011 EV2 1000-1021 Problem List:  Patient Active Problem List  Diagnoses  . Candiduria  . Diabetes mellitus  . Hypertension  . Acute renal failure  . CKD (chronic kidney disease), stage III  . Thrombocytopenia  . Pulmonary edema  . Acute combined systolic and diastolic congestive heart failure  . Hyponatremia    Past Medical History:  Past Medical History  Diagnosis Date  . Diabetes mellitus   . Hypertension   . Vascular disease   . Shortness of breath    Past Surgical History:  Past Surgical History  Procedure Date  . Bladder tack   . Femoral artery stent     OT Assessment/Plan/Recommendation OT Assessment Clinical Impression Statement: Pt 78 yo female diagnosed w candiduria. Presents w/overall decline in activity tolerance, decreased ADL performance. Skilled OT recommended to maximize pt independence to supervision level for safe d/c home w/prn A from daughter, HHOT and HH aide. OT Recommendation/Assessment: Patient will need skilled OT in the acute care venue OT Problem List: Decreased activity tolerance;Impaired vision/perception;Decreased safety awareness OT Therapy Diagnosis : Generalized weakness OT Plan OT Frequency: Min 2X/week OT Treatment/Interventions: Self-care/ADL training;Therapeutic activities;DME and/or AE instruction;Patient/family education OT Recommendation Follow Up Recommendations: Home health OT;Supervision/Assistance - 24 hour (HH aide) Equipment Recommended: None recommended by OT Individuals Consulted Consulted and Agree with Results and Recommendations: Patient;Family member/caregiver OT Goals Acute Rehab OT Goals OT Goal Formulation: With patient/family ADL Goals Pt Will Perform Grooming: with supervision;Standing at sink (X 2 tasks to improve standing activity tolerance.) ADL Goal: Grooming - Progress: Goal set  today Pt Will Perform Lower Body Dressing: with supervision;Sit to stand from bed;Sit to stand from chair ADL Goal: Lower Body Dressing - Progress: Goal set today Pt Will Transfer to Toilet: with supervision;Stand pivot transfer;Ambulation;3-in-1 ADL Goal: Toilet Transfer - Progress: Goal set today Pt Will Perform Toileting - Clothing Manipulation: with supervision;Standing ADL Goal: Toileting - Clothing Manipulation - Progress: Goal set today Pt Will Perform Toileting - Hygiene: with supervision;Sit to stand from 3-in-1/toilet ADL Goal: Toileting - Hygiene - Progress: Goal set today  OT Evaluation Precautions/Restrictions  Precautions Precautions: Fall Restrictions Weight Bearing Restrictions: No Prior Functioning Home Living Lives With: Daughter Receives Help From: Other (Comment);Friend(s) (daughter) Type of Home: House Home Layout: One level Home Access: Stairs to enter Entrance Stairs-Rails: Right Entrance Stairs-Number of Steps: 5 Bathroom Shower/Tub: Tub/shower unit;Curtain Bathroom Toilet: Standard Bathroom Accessibility: No Home Adaptive Equipment: Tub transfer bench;Bedside commode/3-in-1;Walker - four wheeled;Other (comment);Wheelchair - manual (home O2) Prior Function Level of Independence: Needs assistance with ADLs Bath: Moderate Dressing: Supervision/set-up Driving: No ADL ADL Eating/Feeding: Performed;Set up Where Assessed - Eating/Feeding: Chair Grooming: Performed;Wash/dry face;Set up Where Assessed - Grooming: Sitting, bed;Unsupported Upper Body Bathing: Simulated;Moderate assistance Where Assessed - Upper Body Bathing: Unsupported;Sitting, bed Lower Body Bathing: Simulated;Moderate assistance Where Assessed - Lower Body Bathing: Sit to stand from bed Upper Body Dressing: Simulated;Minimal assistance Where Assessed - Upper Body Dressing: Sitting, bed;Unsupported Lower Body Dressing: Simulated;Moderate assistance Where Assessed - Lower Body Dressing:  Sit to stand from bed Toilet Transfer: Simulated;Minimal assistance Toilet Transfer Method: Stand pivot Toileting - Clothing Manipulation: Simulated;Moderate assistance Where Assessed - Toileting Clothing Manipulation: Standing Toileting - Hygiene: Simulated;Maximal assistance Where Assessed - Toileting Hygiene: Standing Tub/Shower Transfer: Not assessed Tub/Shower Transfer Method: Not assessed Equipment Used: Rolling walker ADL Comments: Pt very lethargic, groggy. Max cueing and encouragement needed for participation. Vision/Perception  Vision - History  Baseline Vision: Legally blind Patient Visual Report: No change from baseline Cognition Cognition Arousal/Alertness: Awake/alert Overall Cognitive Status: History of cognitive impairments History of Cognitive Impairment: Appears at baseline functioning Orientation Level: Oriented to person;Oriented to place;Disoriented to time;Oriented to situation Sensation/Coordination   Extremity Assessment RUE Assessment RUE Assessment: Within Functional Limits LUE Assessment LUE Assessment: Within Functional Limits Mobility  Bed Mobility Bed Mobility: Yes Supine to Sit: 4: Min assist;With rails;HOB flat Transfers Transfers: Yes Sit to Stand: 4: Min assist;From bed;From elevated surface;With upper extremity assist Sit to Stand Details (indicate cue type and reason): Max VCs needed for technique, hand placement. Posterior lean noted upon standing. Stand to Sit: 4: Min assist;With upper extremity assist;To chair/3-in-1;With armrests Stand to Sit Details: VCs for hand placement, control descent. Exercises   End of Session OT - End of Session Equipment Utilized During Treatment: Other (comment) (RW) Activity Tolerance: Patient limited by fatigue Patient left: in chair;with call bell in reach;with family/visitor present General Behavior During Session: [facility] for tasks performed Cognition: [facility] for tasks performed   [name] A,  OTR/L [phone] 06/26/2011, 10:31 AM

## 2011-06-26 NOTE — Progress Notes (Signed)
Pt daughter called nurse into the room, pt was trying to get up but became unresponsive to name being called, and wouldn't move when prompted.  Pt was rolling her tongue and was incontinent of stool.  Respirations became labored, slow, then increased, but shallow.  CBG was 183, pt was diaphoretic.  Rapid response was called, MD present.  BP 82/48, HR 50's, resp 8-25.  Pt began to respond to sternal pressure, then opened eyes and responded to name and commands. Orders received.  Will continue to monitor per MD order.

## 2011-06-27 LAB — BASIC METABOLIC PANEL
BUN: 81 mg/dL — ABNORMAL HIGH (ref 6–23)
Chloride: 89 mEq/L — ABNORMAL LOW (ref 96–112)
GFR calc Af Amer: 17 mL/min — ABNORMAL LOW (ref >90)
GFR calc non Af Amer: 15 mL/min — ABNORMAL LOW (ref >90)
Potassium: 3.8 mEq/L (ref 3.5–5.1)
Sodium: 127 mEq/L — ABNORMAL LOW (ref 135–145)

## 2011-06-27 LAB — GLUCOSE, CAPILLARY
Glucose-Capillary: 153 mg/dL — ABNORMAL HIGH (ref 70–99)
Glucose-Capillary: 161 mg/dL — ABNORMAL HIGH (ref 70–99)

## 2011-06-27 LAB — CBC
MCH: 26.4 pg (ref 26.0–34.0)
MCHC: 32.1 g/dL (ref 30.0–36.0)
Platelets: 95 10*3/uL — ABNORMAL LOW (ref 150–400)
RBC: 3.29 MIL/uL — ABNORMAL LOW (ref 3.87–5.11)

## 2011-06-27 MED ORDER — SODIUM CHLORIDE 0.9 % IV SOLN
INTRAVENOUS | Status: AC
  Administered 2011-06-27: 14:00:00 via INTRAVENOUS

## 2011-06-27 MED ORDER — ALUM & MAG HYDROXIDE-SIMETH 200-200-20 MG/5ML PO SUSP: 30.0000 mL | Freq: Three times a day (TID) | ORAL | Status: DC

## 2011-06-27 MED ORDER — CALCIUM CARBONATE ANTACID 500 MG PO CHEW
1.0000 | CHEWABLE_TABLET | Freq: Once | ORAL | Status: AC
  Administered 2011-06-27: 200 mg via ORAL
  Filled 2011-06-27: qty 1

## 2011-06-27 MED ORDER — FLUCONAZOLE 100 MG PO TABS
100.0000 mg | ORAL_TABLET | Freq: Every day | ORAL | Status: DC
  Administered 2011-06-28 – 2011-06-30 (×3): 100 mg via ORAL
  Filled 2011-06-27 (×4): qty 1

## 2011-06-27 NOTE — Progress Notes (Signed)
Subjective: Yesterday the rapid response teas called on her. She was trying to get off the bed, her daughter was helping her. She felt very dizzy and laid back on the bed. She was unresponsive for 30 seconds. Rapid response was called on her period and when they came she did she was very lethargic, her blood pressure dropped to 82/48. Heart rate was in the 50s. CODE BLUE was called and her they started on round of chest compression and patient woke up. According to the nursing staff and the monitor there was no loss of her rhythm. It was always showing atrial fibrillation, with a heart rate dropped to the 50s. Patient was given IV fluids with total of 750 cc. Afterwards she regained her consciousness she was carrying conversation she is alert awake oriented x3.   this morning she is doing fine she ate her breakfast no complaints.    Objective: Vital signs in last 24 hours: Filed Vitals:   06/26/11 1500 06/26/11 1525 06/26/11 2238 06/27/11 0409  BP: 88/62 88/60 133/61 129/73  Pulse:   70 69  Temp:   97.5 F (36.4 C) 97.6 F (36.4 C)  TempSrc:   Oral Oral  Resp:   17 16  Height:      Weight:    69.4 kg (153 lb)  SpO2:   100% 99%    Intake/Output Summary (Last 24 hours) at 06/27/11 0848 Last data filed at 06/27/11 0412  Gross per 24 hour  Intake   1280 ml  Output   1600 ml  Net   -320 ml    Weight change: 1.3 kg (2 lb 13.9 oz)  Weight change:  RUE:[id number] resting in bed-laying flat on her back  NWG:NFAOZ RRR, normal S1 and S2  Resp:Coarse BS with bibasal fine rales  HYQ:MVHQ, NT, BS normal  Ext:No LE edema   Lab Results: Results for orders placed during the hospital encounter of 06/20/11 (from the past 24 hour(s))  GLUCOSE, CAPILLARY     Status: Normal   Collection Time   06/26/11 12:05 PM      Component Value Range   Glucose-Capillary 96  70 - 99 (mg/dL)  GLUCOSE, CAPILLARY     Status: Abnormal   Collection Time   06/26/11  2:20 PM      Component Value Range   Glucose-Capillary 184 (*) 70 - 99 (mg/dL)  GLUCOSE, CAPILLARY     Status: Abnormal   Collection Time   06/26/11  4:31 PM      Component Value Range   Glucose-Capillary 223 (*) 70 - 99 (mg/dL)  GLUCOSE, CAPILLARY     Status: Abnormal   Collection Time   06/26/11  8:07 PM      Component Value Range   Glucose-Capillary 247 (*) 70 - 99 (mg/dL)   Comment 1 Documented in Chart     Comment 2 Notify RN    GLUCOSE, CAPILLARY     Status: Abnormal   Collection Time   06/26/11 11:55 PM      Component Value Range   Glucose-Capillary 222 (*) 70 - 99 (mg/dL)  GLUCOSE, CAPILLARY     Status: Abnormal   Collection Time   06/27/11  4:04 AM      Component Value Range   Glucose-Capillary 162 (*) 70 - 99 (mg/dL)  BASIC METABOLIC PANEL     Status: Abnormal   Collection Time   06/27/11  5:16 AM      Component Value Range   Sodium 127 (*)  135 - 145 (mEq/L)   Potassium 3.8  3.5 - 5.1 (mEq/L)   Chloride 89 (*) 96 - 112 (mEq/L)   CO2 23  19 - 32 (mEq/L)   Glucose, Bld 156 (*) 70 - 99 (mg/dL)   BUN 81 (*) 6 - 23 (mg/dL)   Creatinine, Ser 0.98 (*) 0.50 - 1.10 (mg/dL)   Calcium 8.3 (*) 8.4 - 10.5 (mg/dL)   GFR calc non Af Amer 15 (*) >90 (mL/min)   GFR calc Af Amer 17 (*) >90 (mL/min)  CBC     Status: Abnormal   Collection Time   06/27/11  5:16 AM      Component Value Range   WBC 12.9 (*) 4.0 - 10.5 (K/uL)   RBC 3.29 (*) 3.87 - 5.11 (MIL/uL)   Hemoglobin 8.7 (*) 12.0 - 15.0 (g/dL)   HCT 11.9 (*) 14.7 - 46.0 (%)   MCV 82.4  78.0 - 100.0 (fL)   MCH 26.4  26.0 - 34.0 (pg)   MCHC 32.1  30.0 - 36.0 (g/dL)   RDW 82.9 (*) 56.2 - 15.5 (%)   Platelets 95 (*) 150 - 400 (K/uL)  GLUCOSE, CAPILLARY     Status: Abnormal   Collection Time   06/27/11  7:49 AM      Component Value Range   Glucose-Capillary 176 (*) 70 - 99 (mg/dL)   Comment 1 Documented in Chart       Micro: Recent Results (from the past 240 hour(s))  WET PREP, GENITAL     Status: Abnormal   Collection Time   06/20/11 10:30 AM      Component Value  Range Status Comment   Yeast Wet Prep HPF POC FEW (*) NONE SEEN  Final    Trich, Wet Prep NONE SEEN  NONE SEEN  Final    Clue Cells Wet Prep HPF POC FEW (*) NONE SEEN  Final    WBC, Wet Prep HPF POC MODERATE (*) NONE SEEN  Final   URINE CULTURE     Status: Normal   Collection Time   06/20/11 12:06 PM      Component Value Range Status Comment   Specimen Description URINE, CLEAN CATCH   Final    Special Requests NONE   Final    Culture  Setup Time [id number]   Final    Colony Count >=100,000 COLONIES/ML   Final    Culture YEAST   Final    Report Status 06/21/2011 FINAL   Final   CULTURE, BLOOD (ROUTINE X 2)     Status: Normal   Collection Time   06/20/11 12:20 PM      Component Value Range Status Comment   Specimen Description BLOOD LEFT HAND   Final    Special Requests BOTTLES DRAWN AEROBIC AND ANAEROBIC [facility]   Final    Culture  Setup Time [id number]   Final    Culture NO GROWTH 5 DAYS   Final    Report Status 06/26/2011 FINAL   Final   CULTURE, BLOOD (ROUTINE X 2)     Status: Normal   Collection Time   06/20/11 12:30 PM      Component Value Range Status Comment   Specimen Description BLOOD LEFT ARM   Final    Special Requests BOTTLES DRAWN AEROBIC AND ANAEROBIC [facility]   Final    Culture  Setup Time [id number]   Final    Culture NO GROWTH 5 DAYS   Final    Report Status 06/26/2011  FINAL   Final   CLOSTRIDIUM DIFFICILE BY PCR     Status: Normal   Collection Time   06/22/11  1:31 PM      Component Value Range Status Comment   C difficile by pcr NEGATIVE  NEGATIVE  Final     Studies/Results: No results found.  Medications: Scheduled Meds:    . sodium chloride   Intravenous Once  . sodium chloride   Intravenous Once  . ALPRAZolam  0.25 mg Oral QHS  . aspirin EC  81 mg Oral QPM  . cefTRIAXone (ROCEPHIN)  IV  1 g Intravenous Q24H  . fluconazole  100 mg Oral Q48H  . fluticasone  1 spray Each Nare Daily  . insulin aspart  0-20 Units Subcutaneous Q4H  . insulin lispro  protamine-insulin lispro  20 Units Subcutaneous BID AC  . nitroGLYCERIN  0.2 mg Transdermal Daily  . saccharomyces boulardii  250 mg Oral BID  . DISCONTD: azithromycin  500 mg Intravenous Q24H  . DISCONTD: furosemide  40 mg Oral BID  . DISCONTD: metoprolol tartrate  12.5 mg Oral BID  . DISCONTD: nitroGLYCERIN  0.4 mg Transdermal Daily  . DISCONTD: potassium chloride  10 mEq Oral Once   Continuous Infusions:   PRN Meds:.acetaminophen, acetaminophen, albuterol, albuterol-ipratropium, HYDROcodone-acetaminophen, ondansetron (ZOFRAN) IV, ondansetron, traMADol   Assessment:  Patient Active Problem List  Diagnoses Date Noted  . Acute renal failure 06/25/2011  . CKD (chronic kidney disease), stage III 06/25/2011  . Thrombocytopenia 06/25/2011  . Pulmonary edema 06/25/2011  . Acute combined systolic and diastolic congestive heart failure 06/25/2011  . Hyponatremia 06/25/2011  . Candiduria 06/20/2011  . Diabetes mellitus 06/20/2011  . Hypertension 06/20/2011    Acute combined CHF Per cardiology.  patient back to her home requirement of oxygen at 2 L. Patient was getting Lasix 40 mg twice a day that was discontinued yesterday. After the low blood pressure also the nitroglycerin, metoprolol was discontinued. Will keep the patient in the hospital or another night cardiology please advise about discharge medication likely to be started Monday morning.  Thrombocytopenia There is moderate decline in her platelets. His time of admission platelets were more than 121. Last summer it was more than 200. Patient evaluated by hematology and recommended for close followup no need for intervention for now, this is likely cumulative effects of drugs and acute illness.  Acute renal failure on CKD stage III Baseline creatinine 1.4 -1.6. Per nephrology notes suspect a primary etiology is contrast-induced nephropathy, with contributing etiologies including CHF exacerbation. Likely ATN  renal function is  improving, Lasix was discontinued yesterday.   Hyponatremia Likely multifactorial secondary to acute renal failure, CHF and diuresis. Hanging around 120. Baseline 130  UTI Candiduria, patient on fluconazole. Also has Foley catheter.  COPD exacerbation versus community-acquired pneumonia Patient started on Zosyn and vancomycin. Been discontinued after this is been attributed to fluid overload and congestive heart failure. now on Rocephin I will discontinue that.   Disposition Likely to be discharged in 1-2 days.   LOS: 7 days   Danai Gotto A 06/27/2011, 8:48 AM

## 2011-06-27 NOTE — Plan of Care (Signed)
Problem: Phase I Progression Outcomes Goal: Other Phase I Outcomes/Goals Outcome: Progressing Pt tolerating meals well. DM management remains in progress. AW

## 2011-06-27 NOTE — Progress Notes (Signed)
ANTIBIOTIC CONSULT NOTE - FOLLOW UP  Pharmacy Consult for Fluconazole Indication: UTI  Allergies  Allergen Reactions  . Avelox (Moxifloxacin Hcl In Nacl)   . Coumadin Other (See Comments)    Causes severe bleeding  . Proton Pump Inhibitors     Patient Measurements: Height: 5\' 3"  (160 cm) Weight: 153 lb (69.4 kg) IBW/kg (Calculated) : 52.4   Vital Signs: Temp: 97.6 F (36.4 C) (02/09 0409) Temp src: Oral (02/09 0409) BP: 129/73 mmHg (02/09 0409) Pulse Rate: 69  (02/09 0409) Intake/Output from previous day: 02/08 0701 - 02/09 0700 In: 1280 [P.O.:480; I.V.:500; IV Piggyback:300] Out: 1600 [Urine:1600] Intake/Output from this shift:    Labs:  Basename 06/27/11 0516 06/26/11 0429 06/25/11 1150 06/25/11 0555  WBC 12.9* 11.8* -- 8.9  HGB 8.7* 8.6* -- 8.0*  PLT 95* 73* 52* --  LABCREA -- -- -- --  CREATININE 2.74* 3.12* -- 3.59*   Estimated Creatinine Clearance: 14.5 ml/min (by C-G formula based on Cr of 2.74). No results found for this basename: VANCOTROUGH:2,VANCOPEAK:2,VANCORANDOM:2,GENTTROUGH:2,GENTPEAK:2,GENTRANDOM:2,TOBRATROUGH:2,TOBRAPEAK:2,TOBRARND:2,AMIKACINPEAK:2,AMIKACINTROU:2,AMIKACIN:2, in the last 72 hours   Microbiology: Recent Results (from the past 720 hour(s))  WET PREP, GENITAL     Status: Abnormal   Collection Time   06/20/11 10:30 AM      Component Value Range Status Comment   Yeast Wet Prep HPF POC FEW (*) NONE SEEN  Final    Trich, Wet Prep NONE SEEN  NONE SEEN  Final    Clue Cells Wet Prep HPF POC FEW (*) NONE SEEN  Final    WBC, Wet Prep HPF POC MODERATE (*) NONE SEEN  Final   URINE CULTURE     Status: Normal   Collection Time   06/20/11 12:06 PM      Component Value Range Status Comment   Specimen Description URINE, CLEAN CATCH   Final    Special Requests NONE   Final    Culture  Setup Time [id number]   Final    Colony Count >=100,000 COLONIES/ML   Final    Culture YEAST   Final    Report Status 06/21/2011 FINAL   Final   CULTURE, BLOOD  (ROUTINE X 2)     Status: Normal   Collection Time   06/20/11 12:20 PM      Component Value Range Status Comment   Specimen Description BLOOD LEFT HAND   Final    Special Requests BOTTLES DRAWN AEROBIC AND ANAEROBIC [facility]   Final    Culture  Setup Time [id number]   Final    Culture NO GROWTH 5 DAYS   Final    Report Status 06/26/2011 FINAL   Final   CULTURE, BLOOD (ROUTINE X 2)     Status: Normal   Collection Time   06/20/11 12:30 PM      Component Value Range Status Comment   Specimen Description BLOOD LEFT ARM   Final    Special Requests BOTTLES DRAWN AEROBIC AND ANAEROBIC [facility]   Final    Culture  Setup Time [id number]   Final    Culture NO GROWTH 5 DAYS   Final    Report Status 06/26/2011 FINAL   Final   CLOSTRIDIUM DIFFICILE BY PCR     Status: Normal   Collection Time   06/22/11  1:31 PM      Component Value Range Status Comment   C difficile by pcr NEGATIVE  NEGATIVE  Final     Anti-infectives     Start  Dose/Rate Route Frequency Ordered Stop   06/25/11 1200   cefTRIAXone (ROCEPHIN) 1 g in dextrose 5 % 50 mL IVPB        1 g 100 mL/hr over 30 Minutes Intravenous Every 24 hours 06/25/11 1108     06/25/11 1200   azithromycin (ZITHROMAX) 500 mg in dextrose 5 % 250 mL IVPB  Status:  Discontinued        500 mg 250 mL/hr over 60 Minutes Intravenous Every 24 hours 06/25/11 1108 06/26/11 1513   06/23/11 1000   fluconazole (DIFLUCAN) tablet 100 mg        100 mg Oral Every 48 hours 06/22/11 0850     06/22/11 1400   piperacillin-tazobactam (ZOSYN) IVPB 2.25 g  Status:  Discontinued        2.25 g 100 mL/hr over 30 Minutes Intravenous 3 times per day 06/22/11 0851 06/25/11 1108   06/21/11 1000   fluconazole (DIFLUCAN) tablet 100 mg  Status:  Discontinued        100 mg Oral Daily 06/20/11 1500 06/22/11 0849   06/20/11 2200   piperacillin-tazobactam (ZOSYN) IVPB 3.375 g  Status:  Discontinued        3.375 g 12.5 mL/hr over 240 Minutes Intravenous 3 times per day 06/20/11  1516 06/22/11 0850   06/20/11 1530   vancomycin (VANCOCIN) 750 mg in sodium chloride 0.9 % 150 mL IVPB  Status:  Discontinued        750 mg 150 mL/hr over 60 Minutes Intravenous Every 24 hours 06/20/11 1516 06/21/11 1232   06/20/11 1500   piperacillin-tazobactam (ZOSYN) IVPB 3.375 g        3.375 g 100 mL/hr over 30 Minutes Intravenous  Once 06/20/11 1458 06/20/11 1603   06/20/11 1230   cefTRIAXone (ROCEPHIN) 1 g in dextrose 5 % 50 mL IVPB  Status:  Discontinued        1 g 100 mL/hr over 30 Minutes Intravenous Every 24 hours 06/20/11 1221 06/20/11 1434   06/20/11 1000   cefTRIAXone (ROCEPHIN) 1 g in dextrose 5 % 50 mL IVPB        1 g 100 mL/hr over 30 Minutes Intravenous  Once 06/20/11 0955 06/20/11 1103   06/20/11 1000   fluconazole (DIFLUCAN) tablet 150 mg        150 mg Oral  Once 06/20/11 0955 06/20/11 1033          Assessment: Day # 8 fluconazole (started 2/2) for UTI collected 2/2 with 100K yeast.. SCr continues to trend down, current CrCl est 14.5 ml/min.   Plan:  Change Diflucan to 100mg  PO q24 for improved SCr and CrCl > 10 ml/min  [name] 06/27/2011,11:23 AM

## 2011-06-27 NOTE — Progress Notes (Signed)
Subjective:  Patient denies any chest pain or shortness of breath Denies any urinary complaints but states feels tired and weak  Objective:  Vital Signs in the last 24 hours: Temp:  [97.4 F (36.3 C)-97.6 F (36.4 C)] 97.6 F (36.4 C) (02/09 0409) Pulse Rate:  [58-70] 69  (02/09 0409) Resp:  [16-17] 16  (02/09 0409) BP: (88-133)/(60-73) 129/73 mmHg (02/09 0409) SpO2:  [90 %-100 %] 99 % (02/09 0409) Weight:  [69.4 kg (153 lb)] 69.4 kg (153 lb) (02/09 0409)  Intake/Output from previous day: 02/08 0701 - 02/09 0700 In: 1280 [P.O.:480; I.V.:500; IV Piggyback:300] Out: 1600 [Urine:1600] Intake/Output from this shift:    Physical Exam: Neck: no adenopathy, supple, symmetrical, trachea midline and No JVD Lungs:  decreeasseedd bbreath ssounds at bases with left basilar rhonchi Heart: regular rate and rhythm, S1, S2 normal and Soft systolic murmur and mid diastolic rumble noted Abdomen: soft, non-tender; bowel sounds normal; no masses,  no organomegaly Extremities: extremities normal, atraumatic, no cyanosis or edema  Lab Results:  [facility] 06/27/11 0516 06/26/11 0429  WBC 12.9* 11.8*  HGB 8.7* 8.6*  PLT 95* 73*    Basename 06/27/11 0516 06/26/11 0429  NA 127* 125*  K 3.8 4.1  CL 89* 88*  CO2 23 20  GLUCOSE 156* 139*  BUN 81* 78*  CREATININE 2.74* 3.12*   No results found for this basename: TROPONINI:2,CK,MB:2 in the last 72 hours Hepatic Function Panel  Basename 06/26/11 0429  PROT --  ALBUMIN 2.6*  AST --  ALT --  ALKPHOS --  BILITOT --  BILIDIR --  IBILI --   No results found for this basename: CHOL in the last 72 hours No results found for this basename: PROTIME in the last 72 hours  Imaging: No results found.  Cardiac Studies:  Assessment/Plan:  Resolving acute systolic heart failure Valvular heart disease Coronary artery disease History of CVA Resolving left lower lobe pneumonia Resolving UTI  Hypertension Diabetes matters Anemia of chronic  disease the Acute on chronic renal insufficiency improved Dementia COPD Hyponatremia Plan As per orders  LOS: 7 days    [name] N 06/27/2011, 1:06 PM

## 2011-06-28 ENCOUNTER — Inpatient Hospital Stay (HOSPITAL_COMMUNITY): Payer: 59

## 2011-06-28 LAB — CBC
HCT: 28.3 % — ABNORMAL LOW (ref 36.0–46.0)
Hemoglobin: 9.1 g/dL — ABNORMAL LOW (ref 12.0–15.0)
RBC: 3.36 MIL/uL — ABNORMAL LOW (ref 3.87–5.11)

## 2011-06-28 LAB — BASIC METABOLIC PANEL
CO2: 23 mEq/L (ref 19–32)
Chloride: 94 mEq/L — ABNORMAL LOW (ref 96–112)
Glucose, Bld: 110 mg/dL — ABNORMAL HIGH (ref 70–99)
Potassium: 3.8 mEq/L (ref 3.5–5.1)
Sodium: 130 mEq/L — ABNORMAL LOW (ref 135–145)

## 2011-06-28 LAB — GLUCOSE, CAPILLARY
Glucose-Capillary: 112 mg/dL — ABNORMAL HIGH (ref 70–99)
Glucose-Capillary: 140 mg/dL — ABNORMAL HIGH (ref 70–99)

## 2011-06-28 MED ORDER — FUROSEMIDE 10 MG/ML IJ SOLN
40.0000 mg | Freq: Once | INTRAMUSCULAR | Status: AC
  Administered 2011-06-28: 40 mg via INTRAVENOUS
  Filled 2011-06-28: qty 4

## 2011-06-28 NOTE — Progress Notes (Signed)
Subjective:  Patient complains of feeling weak and tired Denies any chest pain or shortness of breath Denies any fever or chills  Objective:  Vital Signs in the last 24 hours: Temp:  [97.2 F (36.2 C)-98.2 F (36.8 C)] 98.2 F (36.8 C) (02/10 0450) Pulse Rate:  [81-85] 85  (02/10 0450) Resp:  [18] 18  (02/10 0450) BP: (126-155)/(64-90) 155/90 mmHg (02/10 0450) SpO2:  [95 %-99 %] 98 % (02/10 0450) Weight:  [69.2 kg (152 lb 8.9 oz)] 69.2 kg (152 lb 8.9 oz) (02/10 0450)  Intake/Output from previous day: 02/09 0701 - 02/10 0700 In: 970 [P.O.:320; I.V.:300; IV Piggyback:350] Out: 2950 [Urine:2950] Intake/Output from this shift:    Physical Exam: Neck: no adenopathy and no carotid bruit Lungs: Bilateral rhonchi and rales left more than the right Heart: regular rate and rhythm, S1, S2 normal and Soft systolic murmur and mid diastolic rumble noted Abdomen: soft, non-tender; bowel sounds normal; no masses,  no organomegaly Extremities: extremities normal, atraumatic, no cyanosis or edema Pulses: 2+ and symmetric  Lab Results:  Basename 06/28/11 0425 06/27/11 0516  WBC 14.6* 12.9*  HGB 9.1* 8.7*  PLT 106* 95*    Basename 06/28/11 0425 06/27/11 0516  NA 130* 127*  K 3.8 3.8  CL 94* 89*  CO2 23 23  GLUCOSE 110* 156*  BUN 64* 81*  CREATININE 2.11* 2.74*   No results found for this basename: TROPONINI:2,CK,MB:2 in the last 72 hours Hepatic Function Panel  Basename 06/26/11 0429  PROT --  ALBUMIN 2.6*  AST --  ALT --  ALKPHOS --  BILITOT --  BILIDIR --  IBILI --   No results found for this basename: CHOL in the last 72 hours No results found for this basename: PROTIME in the last 72 hours  Imaging: No results found.  Cardiac Studies:  Assessment/Plan:  Resolving acute systolic heart failure Valvular heart disease Resolving left lower lobe pneumonia COPD Hypertension Diabetes matters History of CVA Dimension Resolving acute on chronic renal  injury Anemia of chronic disease Resolving hyponatremia  Plan Continue present management Check chest x-ray in a.m. Dr. Algie Coffer to follow in a.m.  LOS: 8 days    [name] 06/28/2011, 10:22 AM

## 2011-06-28 NOTE — Progress Notes (Signed)
[facility] hospitalist progress note. Chief complaint. Dyspnea. History of present illness. This 78 year old female admitted with congestive heart failure. She had been managed with Lasix 40 mg IV twice daily but this was recently held due to a low blood pressure. Staff notes patient complaining of some increased dyspnea. I did see the patient at bedside. She is in no distress but did have some jugular venous distention and some crackles in the lungs. A portable chest x-ray was requested showing congestive heart failure with small effusions. Vital signs. Temperature 97.9, pulse 88, respiration 18, blood pressure 151/87. Gen. appearance. Frail elderly female in no distress. There is no increase work of breathing. She is able to speak in full sentences. Her O2 sats are stable. Cardiac. Distant heart sounds. Rate and rhythm regular. She does have some jugular venous distention. There is no peripheral edema. Lungs. A left lung fields are dependent and crackles noted left midlung to base. The right lung fields are clear. Abdomen. Soft with positive bowel sounds. No pain. Impression/plan. Problems 1. Congestive heart failure. I believe the patient is a retained some fluid her a chest x-ray and physical exam. I will give her a dose of Lasix 40 mg IV for one time now. Her blood pressure is certainly adequate now to support this. They wish to resume Lasix at a reduced dose in the a.m. but will defer this to rounding physician and cardiology consult. She does have a metabolic panel pending for this a.m.

## 2011-06-28 NOTE — Progress Notes (Signed)
Subjective:  this morning she is doing fine, daughter at bedside.    Objective: Vital signs in last 24 hours: Filed Vitals:   06/27/11 0409 06/27/11 1436 06/27/11 2141 06/28/11 0450  BP: 129/73 126/64 150/82 155/90  Pulse: 69 81 85 85  Temp: 97.6 F (36.4 C) 98 F (36.7 C) 97.2 F (36.2 C) 98.2 F (36.8 C)  TempSrc: Oral Oral Oral Oral  Resp: 16 18 18 18   Height:      Weight: 69.4 kg (153 lb)   69.2 kg (152 lb 8.9 oz)  SpO2: 99% 95% 99% 98%    Intake/Output Summary (Last 24 hours) at 06/28/11 0911 Last data filed at 06/28/11 0450  Gross per 24 hour  Intake    850 ml  Output   2950 ml  Net  -2100 ml    Weight change: -0.2 kg (-7.1 oz)  Weight change:  JXB:[id number] resting in bed-laying flat on her back  YQM:VHQIO RRR, normal S1 and S2  Resp:Coarse BS with bibasal fine rales  NGE:XBMW, NT, BS normal  Ext:No LE edema   Lab Results: Results for orders placed during the hospital encounter of 06/20/11 (from the past 24 hour(s))  GLUCOSE, CAPILLARY     Status: Abnormal   Collection Time   06/27/11 11:38 AM      Component Value Range   Glucose-Capillary 153 (*) 70 - 99 (mg/dL)   Comment 1 Documented in Chart    GLUCOSE, CAPILLARY     Status: Abnormal   Collection Time   06/27/11  4:56 PM      Component Value Range   Glucose-Capillary 161 (*) 70 - 99 (mg/dL)   Comment 1 Documented in Chart     Comment 2 Notify RN    GLUCOSE, CAPILLARY     Status: Abnormal   Collection Time   06/27/11  7:59 PM      Component Value Range   Glucose-Capillary 161 (*) 70 - 99 (mg/dL)  GLUCOSE, CAPILLARY     Status: Abnormal   Collection Time   06/27/11 11:57 PM      Component Value Range   Glucose-Capillary 140 (*) 70 - 99 (mg/dL)  CBC     Status: Abnormal   Collection Time   06/28/11  4:25 AM      Component Value Range   WBC 14.6 (*) 4.0 - 10.5 (K/uL)   RBC 3.36 (*) 3.87 - 5.11 (MIL/uL)   Hemoglobin 9.1 (*) 12.0 - 15.0 (g/dL)   HCT 41.3 (*) 24.4 - 46.0 (%)   MCV 84.2  78.0 -  100.0 (fL)   MCH 27.1  26.0 - 34.0 (pg)   MCHC 32.2  30.0 - 36.0 (g/dL)   RDW 01.0 (*) 27.2 - 15.5 (%)   Platelets 106 (*) 150 - 400 (K/uL)  BASIC METABOLIC PANEL     Status: Abnormal   Collection Time   06/28/11  4:25 AM      Component Value Range   Sodium 130 (*) 135 - 145 (mEq/L)   Potassium 3.8  3.5 - 5.1 (mEq/L)   Chloride 94 (*) 96 - 112 (mEq/L)   CO2 23  19 - 32 (mEq/L)   Glucose, Bld 110 (*) 70 - 99 (mg/dL)   BUN 64 (*) 6 - 23 (mg/dL)   Creatinine, Ser 5.36 (*) 0.50 - 1.10 (mg/dL)   Calcium 8.7  8.4 - 64.4 (mg/dL)   GFR calc non Af Amer 21 (*) >90 (mL/min)   GFR calc Af Amer 24 (*) >  90 (mL/min)  GLUCOSE, CAPILLARY     Status: Abnormal   Collection Time   06/28/11  4:34 AM      Component Value Range   Glucose-Capillary 112 (*) 70 - 99 (mg/dL)     Micro: Recent Results (from the past 240 hour(s))  WET PREP, GENITAL     Status: Abnormal   Collection Time   06/20/11 10:30 AM      Component Value Range Status Comment   Yeast Wet Prep HPF POC FEW (*) NONE SEEN  Final    Trich, Wet Prep NONE SEEN  NONE SEEN  Final    Clue Cells Wet Prep HPF POC FEW (*) NONE SEEN  Final    WBC, Wet Prep HPF POC MODERATE (*) NONE SEEN  Final   URINE CULTURE     Status: Normal   Collection Time   06/20/11 12:06 PM      Component Value Range Status Comment   Specimen Description URINE, CLEAN CATCH   Final    Special Requests NONE   Final    Culture  Setup Time [id number]   Final    Colony Count >=100,000 COLONIES/ML   Final    Culture YEAST   Final    Report Status 06/21/2011 FINAL   Final   CULTURE, BLOOD (ROUTINE X 2)     Status: Normal   Collection Time   06/20/11 12:20 PM      Component Value Range Status Comment   Specimen Description BLOOD LEFT HAND   Final    Special Requests BOTTLES DRAWN AEROBIC AND ANAEROBIC [facility]   Final    Culture  Setup Time [id number]   Final    Culture NO GROWTH 5 DAYS   Final    Report Status 06/26/2011 FINAL   Final   CULTURE, BLOOD (ROUTINE X 2)      Status: Normal   Collection Time   06/20/11 12:30 PM      Component Value Range Status Comment   Specimen Description BLOOD LEFT ARM   Final    Special Requests BOTTLES DRAWN AEROBIC AND ANAEROBIC [facility]   Final    Culture  Setup Time [id number]   Final    Culture NO GROWTH 5 DAYS   Final    Report Status 06/26/2011 FINAL   Final   CLOSTRIDIUM DIFFICILE BY PCR     Status: Normal   Collection Time   06/22/11  1:31 PM      Component Value Range Status Comment   C difficile by pcr NEGATIVE  NEGATIVE  Final     Studies/Results: No results found.  Medications: Scheduled Meds:    . ALPRAZolam  0.25 mg Oral QHS  . alum & mag hydroxide-simeth  30 mL Oral TID AC  . aspirin EC  81 mg Oral QPM  . calcium carbonate  1 tablet Oral Once  . cefTRIAXone (ROCEPHIN)  IV  1 g Intravenous Q24H  . fluconazole  100 mg Oral Daily  . fluticasone  1 spray Each Nare Daily  . insulin aspart  0-20 Units Subcutaneous Q4H  . insulin lispro protamine-insulin lispro  20 Units Subcutaneous BID AC  . nitroGLYCERIN  0.2 mg Transdermal Daily  . saccharomyces boulardii  250 mg Oral BID  . DISCONTD: fluconazole  100 mg Oral Q48H   Continuous Infusions:    . sodium chloride 50 mL/hr at 06/27/11 1400   PRN Meds:.acetaminophen, acetaminophen, albuterol, albuterol-ipratropium, HYDROcodone-acetaminophen, ondansetron (ZOFRAN) IV, ondansetron, traMADol  Assessment:  Patient Active Problem List  Diagnoses Date Noted  . Acute renal failure 06/25/2011  . CKD (chronic kidney disease), stage III 06/25/2011  . Thrombocytopenia 06/25/2011  . Pulmonary edema 06/25/2011  . Acute combined systolic and diastolic congestive heart failure 06/25/2011  . Hyponatremia 06/25/2011  . Candiduria 06/20/2011  . Diabetes mellitus 06/20/2011  . Hypertension 06/20/2011    Acute combined CHF Per cardiology.  patient back to her home requirement of oxygen at 2 L. Patient was getting Lasix 40 mg twice a day that was  discontinued yesterday. After the low blood pressure also the nitroglycerin, metoprolol was discontinued. Will keep the patient in the hospital or another night.  Thrombocytopenia There is moderate decline in her platelets. His time of admission platelets were more than 121. Last summer it was more than 200. Patient evaluated by hematology and recommended for close followup no need for intervention for now, this is likely cumulative effects of drugs and acute illness. Currently improving, platelets today is 106.  Acute renal failure on CKD stage III Baseline creatinine 1.4 -1.6. Per nephrology notes suspect a primary etiology is contrast-induced nephropathy, with contributing etiologies including CHF exacerbation. Likely ATN  renal function is improving, Lasix was discontinued yesterday.   Hyponatremia Likely multifactorial secondary to acute renal failure, CHF and diuresis. Back to baseline of 130.  UTI Candiduria, patient on fluconazole. Also has Foley catheter.  COPD exacerbation versus community-acquired pneumonia Patient started on Zosyn and vancomycin. Been discontinued after this is been attributed to fluid overload and congestive heart failure. now on Rocephin I will discontinue that.   Disposition Likely to be discharged in AM, cardiology please advise about discharge medication. She is expected to be discharged in the morning.   LOS: 8 days   [name] A 06/28/2011, 9:11 AM

## 2011-06-29 ENCOUNTER — Inpatient Hospital Stay (HOSPITAL_COMMUNITY): Payer: 59

## 2011-06-29 LAB — CBC
HCT: 29.1 % — ABNORMAL LOW (ref 36.0–46.0)
Hemoglobin: 9.2 g/dL — ABNORMAL LOW (ref 12.0–15.0)
MCH: 27.5 pg (ref 26.0–34.0)
MCHC: 31.6 g/dL (ref 30.0–36.0)
MCV: 86.9 fL (ref 78.0–100.0)
Platelets: 102 K/uL — ABNORMAL LOW (ref 150–400)
RBC: 3.35 MIL/uL — ABNORMAL LOW (ref 3.87–5.11)
RDW: 17.5 % — ABNORMAL HIGH (ref 11.5–15.5)
WBC: 12.2 K/uL — ABNORMAL HIGH (ref 4.0–10.5)

## 2011-06-29 LAB — GLUCOSE, CAPILLARY
Glucose-Capillary: 65 mg/dL — ABNORMAL LOW (ref 70–99)
Glucose-Capillary: 99 mg/dL (ref 70–99)
Glucose-Capillary: 137 mg/dL — ABNORMAL HIGH (ref 70–99)

## 2011-06-29 LAB — BASIC METABOLIC PANEL
BUN: 60 mg/dL — ABNORMAL HIGH (ref 6–23)
Creatinine, Ser: 1.96 mg/dL — ABNORMAL HIGH (ref 0.50–1.10)
GFR calc non Af Amer: 22 mL/min — ABNORMAL LOW (ref >90)
Glucose, Bld: 76 mg/dL (ref 70–99)
Potassium: 4 mEq/L (ref 3.5–5.1)

## 2011-06-29 MED ORDER — INSULIN ASPART 100 UNIT/ML ~~LOC~~ SOLN
0.0000 [IU] | Freq: Every day | SUBCUTANEOUS | Status: DC
  Administered 2011-06-30: 2 [IU] via SUBCUTANEOUS
  Filled 2011-06-29: qty 3

## 2011-06-29 MED ORDER — FUROSEMIDE 10 MG/ML IJ SOLN
40.0000 mg | Freq: Once | INTRAMUSCULAR | Status: AC
  Administered 2011-06-29: 40 mg via INTRAVENOUS
  Filled 2011-06-29: qty 4

## 2011-06-29 MED ORDER — ISOSORBIDE MONONITRATE 15 MG HALF TABLET
15.0000 mg | ORAL_TABLET | Freq: Every day | ORAL | Status: DC
  Administered 2011-06-29 – 2011-06-30 (×2): 15 mg via ORAL
  Filled 2011-06-29 (×3): qty 1

## 2011-06-29 MED ORDER — INSULIN ASPART 100 UNIT/ML ~~LOC~~ SOLN
0.0000 [IU] | Freq: Three times a day (TID) | SUBCUTANEOUS | Status: DC
  Administered 2011-06-30: 7 [IU] via SUBCUTANEOUS
  Administered 2011-06-30: 4 [IU] via SUBCUTANEOUS
  Filled 2011-06-29: qty 3

## 2011-06-29 NOTE — Progress Notes (Signed)
Subjective:  Quiet today. No chest pain or shortness of breath. Improving Na+ level and renal function.  Objective:  Vital Signs in the last 24 hours: Temp:  [97.5 F (36.4 C)-98.2 F (36.8 C)] 97.5 F (36.4 C) (02/11 1506) Pulse Rate:  [75-88] 81  (02/11 1506) Cardiac Rhythm:  [-] Normal sinus rhythm (02/11 0700) Resp:  [16-18] 16  (02/11 1506) BP: (134-151)/(77-90) 151/90 mmHg (02/11 1506) SpO2:  [98 %-100 %] 98 % (02/11 1506) Weight:  [71.7 kg (158 lb 1.1 oz)] 71.7 kg (158 lb 1.1 oz) (02/11 0508)  Physical Exam: BP Readings from Last 1 Encounters:  06/29/11 151/90    Wt Readings from Last 1 Encounters:  06/29/11 71.7 kg (158 lb 1.1 oz)    Weight change: 2.5 kg (5 lb 8.2 oz)  HEENT: [place]/AT, Eyes-Brown, PERL, EOMI, Conjunctiva-Pale pink, Sclera-Non-icteric Neck: + JVD, No bruit, Trachea midline. Lungs:  Clear, Bilateral. Cardiac:  Regular rhythm, normal S1 and S2, no S3.  Abdomen:  Soft, non-tender. Extremities:  No edema present. No cyanosis. No clubbing. CNS: AxOx2, Cranial nerves grossly intact, moves all 4 extremities. Right handed. Skin: Warm and dry.   Intake/Output from previous day: 02/10 0701 - 02/11 0700 In: 728 [P.O.:720; IV Piggyback:8] Out: 1600 [Urine:1600]    Lab Results: BMET    Component Value Date/Time   NA 133* 06/29/2011 0452   K 4.0 06/29/2011 0452   CL 96 06/29/2011 0452   CO2 26 06/29/2011 0452   GLUCOSE 76 06/29/2011 0452   BUN 60* 06/29/2011 0452   CREATININE 1.96* 06/29/2011 0452   CALCIUM 8.9 06/29/2011 0452   GFRNONAA 22* 06/29/2011 0452   GFRAA 26* 06/29/2011 0452   CBC    Component Value Date/Time   WBC 12.2* 06/29/2011 0452   WBC 9.1 08/23/2009 0819   RBC 3.35* 06/29/2011 0452   RBC 4.42 08/23/2009 0819   HGB 9.2* 06/29/2011 0452   HGB 12.6 08/23/2009 0819   HCT 29.1* 06/29/2011 0452   HCT 38.4 08/23/2009 0819   PLT 102* 06/29/2011 0452   PLT 243 08/23/2009 0819   MCV 86.9 06/29/2011 0452   MCV 86.8 08/23/2009 0819   MCH 27.5 06/29/2011 0452   MCH 28.4 08/23/2009 0819   MCHC 31.6 06/29/2011 0452   MCHC 32.7 08/23/2009 0819   RDW 17.5* 06/29/2011 0452   RDW 16.1* 08/23/2009 0819   LYMPHSABS 0.8 06/20/2011 1000   LYMPHSABS 1.4 08/23/2009 0819   MONOABS 1.0 06/20/2011 1000   MONOABS 0.7 08/23/2009 0819   EOSABS 0.3 06/20/2011 1000   EOSABS 0.3 08/23/2009 0819   BASOSABS 0.0 06/20/2011 1000   BASOSABS 0.0 08/23/2009 0819   CARDIAC ENZYMES Lab Results  Component Value Date   CKTOTAL 79 06/21/2011   CKMB 4.0 06/21/2011   TROPONINI 0.80* 06/21/2011    Assessment/Plan:  Patient Active Hospital Problem List:  Biventricular failure   IV Lasix  Pyelonephritis  On antibiotic  Diabetes Mellitus,II  Improving control  Hypertension  Stable  Hyponatremia- Improving  Acute Renal Failure-ATN- Improving BUN/Cr.  Appreciate renal consult.  Continue I & O.  Anemia, iron deficiency and chronic disease  Dementia  Hold Xanax   LOS: 9 days    Orpah Cobb  MD  06/29/2011, 5:55 PM

## 2011-06-29 NOTE — Progress Notes (Signed)
Notified [facility] of possible d/c today, added HHPT/OT to services, has order for RN for CHF protocol.

## 2011-06-29 NOTE — Progress Notes (Signed)
NotedClint Mata Pager: [phone] 06/29/2011, 8:07 AM

## 2011-06-29 NOTE — Progress Notes (Signed)
CBG: 65  Treatment: 15 GM carbohydrate snack  Symptoms: Sweaty  Follow-up CBG: Time:0500 CBG Result:99  Possible Reasons for Event: Insufficient PO intake and resistant sliding scale  Comments/MD notified: Gave pt graham crackers and a carton of juice. After rechecking BS, NT gave pt another carton of juice for extra precaution. Lenny Pastel, NP notified. No new orders received.     Mechele Collin

## 2011-06-29 NOTE — Progress Notes (Signed)
Likely to be discharged home today if it's okay with cardiology. Chest x-ray reviewed, showed residual infiltrate/pulmonary edema. Patient does not have shortness of breath more than her usual, she is on 2 L of oxygen which is her home requirement. Needs Lasix and adjustment of the other medication, cardiology please advise.  Clint Lipps Pager: [phone] 06/29/2011, 10:16 AM

## 2011-06-29 NOTE — Progress Notes (Signed)
OT Note:  Attempted OT tx; unable to keep pt aroused.  Daughter states she's been sleepy all day.  Will try another time.  [place], OTR/L [phone] 06/29/2011

## 2011-06-30 LAB — CBC
HCT: 29 % — ABNORMAL LOW (ref 36.0–46.0)
Hemoglobin: 9.3 g/dL — ABNORMAL LOW (ref 12.0–15.0)
MCH: 28.2 pg (ref 26.0–34.0)
MCHC: 32.1 g/dL (ref 30.0–36.0)
RDW: 19.4 % — ABNORMAL HIGH (ref 11.5–15.5)

## 2011-06-30 LAB — BASIC METABOLIC PANEL
BUN: 62 mg/dL — ABNORMAL HIGH (ref 6–23)
Calcium: 8.2 mg/dL — ABNORMAL LOW (ref 8.4–10.5)
GFR calc Af Amer: 24 mL/min — ABNORMAL LOW (ref >90)
GFR calc non Af Amer: 21 mL/min — ABNORMAL LOW (ref >90)
Glucose, Bld: 208 mg/dL — ABNORMAL HIGH (ref 70–99)

## 2011-06-30 LAB — GLUCOSE, CAPILLARY: Glucose-Capillary: 222 mg/dL — ABNORMAL HIGH (ref 70–99)

## 2011-06-30 NOTE — Progress Notes (Signed)
Subjective:  Awake and talking. Needed pain medication for back pain. Good diuresis with lasix use.  Objective:  Vital Signs in the last 24 hours: Temp:  [97.5 F (36.4 C)-98 F (36.7 C)] 98 F (36.7 C) (02/12 0531) Pulse Rate:  [78-81] 78  (02/12 0531) Cardiac Rhythm:  [-] Normal sinus rhythm (02/11 2015) Resp:  [16-18] 18  (02/12 0531) BP: (129-151)/(73-90) 129/73 mmHg (02/12 0531) SpO2:  [98 %-100 %] 100 % (02/12 0531) Weight:  [72.3 kg (159 lb 6.3 oz)] 72.3 kg (159 lb 6.3 oz) (02/12 0531)  Physical Exam: BP Readings from Last 1 Encounters:  06/30/11 129/73    Wt Readings from Last 1 Encounters:  06/30/11 72.3 kg (159 lb 6.3 oz)    Weight change: 0.6 kg (1 lb 5.2 oz)  HEENT: [place]/AT, Eyes-Brown, PERL, EOMI, Conjunctiva-Pale pink, Sclera-Non-icteric Neck: No JVD, No bruit, Trachea midline. Lungs:  Clear, Bilateral. Cardiac:  Regular rhythm, normal S1 and S2, no S3.  Abdomen:  Soft, non-tender. Extremities:  No edema present. No cyanosis. No clubbing. CNS: AxOx3, Cranial nerves grossly intact, moves all 4 extremities. Right handed. Skin: Warm and dry.   Intake/Output from previous day: 02/11 0701 - 02/12 0700 In: 720 [P.O.:720] Out: 1900 [Urine:1900]    Lab Results: BMET    Component Value Date/Time   NA 127* 06/30/2011 0418   K 4.6 06/30/2011 0418   CL 92* 06/30/2011 0418   CO2 24 06/30/2011 0418   GLUCOSE 208* 06/30/2011 0418   BUN 62* 06/30/2011 0418   CREATININE 2.07* 06/30/2011 0418   CALCIUM 8.2* 06/30/2011 0418   GFRNONAA 21* 06/30/2011 0418   GFRAA 24* 06/30/2011 0418   CBC    Component Value Date/Time   WBC 10.9* 06/30/2011 0418   WBC 9.1 08/23/2009 0819   RBC 3.30* 06/30/2011 0418   RBC 4.42 08/23/2009 0819   HGB 9.3* 06/30/2011 0418   HGB 12.6 08/23/2009 0819   HCT 29.0* 06/30/2011 0418   HCT 38.4 08/23/2009 0819   PLT 93* 06/30/2011 0418   PLT 243 08/23/2009 0819   MCV 87.9 06/30/2011 0418   MCV 86.8 08/23/2009 0819   MCH 28.2 06/30/2011 0418   MCH 28.4 08/23/2009  0819   MCHC 32.1 06/30/2011 0418   MCHC 32.7 08/23/2009 0819   RDW 19.4* 06/30/2011 0418   RDW 16.1* 08/23/2009 0819   LYMPHSABS 0.8 06/20/2011 1000   LYMPHSABS 1.4 08/23/2009 0819   MONOABS 1.0 06/20/2011 1000   MONOABS 0.7 08/23/2009 0819   EOSABS 0.3 06/20/2011 1000   EOSABS 0.3 08/23/2009 0819   BASOSABS 0.0 06/20/2011 1000   BASOSABS 0.0 08/23/2009 0819   CARDIAC ENZYMES Lab Results  Component Value Date   CKTOTAL 79 06/21/2011   CKMB 4.0 06/21/2011   TROPONINI 0.80* 06/21/2011    Assessment/Plan:  Patient Active Hospital Problem List:  Biventricular failure   Lasix as tolerated Pyelonephritis/Candida infection  On antibiotic/antifungal  Diabetes Mellitus,II  Hypertension   Stable  Hyponatremia  Acute Renal Failure-ATN- Stable.  Appreciate renal consult.   Continue I & O.  Anemia, iron deficiency and chronic disease  Iron supplement  Dementia  Weakness   PT/OT/Ambulate with walker-Home if able to ambulate/F/U 1 week    LOS: 10 days    Orpah Cobb  MD  06/30/2011, 7:39 AM

## 2011-06-30 NOTE — Discharge Summary (Signed)
HOSPITAL DISCHARGE SUMMARY  [name]  MRN: [id number]  DOB:Sep 19, 1925  Date of Admission: 06/20/2011 Date of Discharge: 06/30/2011         LOS: 10 days   Attending Physician:Dion Parrow A  [name] WUJ:WJXBJ,YNWGNFA S, MD, MD  Consults: Treatment Team:  Samul Dada, MD  Thurnell Lose  [facility]  Discharge Diagnoses: Present on Admission:  .Candiduria .Diabetes mellitus .Hypertension .Acute renal failure .CKD (chronic kidney disease), stage III .Thrombocytopenia .Pulmonary edema .Acute combined systolic and diastolic congestive heart failure .Hyponatremia   Medication List  As of 06/30/2011 11:22 AM   TAKE these medications         albuterol-ipratropium 18-103 MCG/ACT inhaler   Commonly known as: COMBIVENT   Inhale 2 puffs into the lungs every 6 (six) hours as needed. For shortness of breath      allopurinol 100 MG tablet   Commonly known as: ZYLOPRIM   Take 100 mg by mouth at bedtime.      ALPRAZolam 0.25 MG tablet   Commonly known as: XANAX   Take 0.25 mg by mouth at bedtime.      aspirin EC 81 MG tablet   Take 81 mg by mouth every evening.      atorvastatin 20 MG tablet   Commonly known as: LIPITOR   Take 20 mg by mouth at bedtime.      B-complex with vitamin C tablet   Take 1 tablet by mouth daily.      Calcium-Magnesium-Zinc 1000-400-15 MG Tabs   Take 1 tablet by mouth daily.      Cinnamon 500 MG capsule   Take 1,000 mg by mouth daily.      CORICIDIN D 2-30-325 MG Tabs   Generic drug: Chlorphen-Pseudoephed-APAP   Take 1 tablet by mouth daily as needed. For cough and chest congestion      digoxin 0.125 MG tablet   Commonly known as: [name]   Take 125 mcg by mouth every other day.      famotidine 20 MG tablet   Commonly known as: PEPCID   Take 20 mg by mouth at bedtime.      furosemide 40 MG tablet   Commonly known as: LASIX   Take 40 mg by mouth 2 (two) times daily.      GERITOL COMPLETE PO   Take 2 tablets by  mouth daily.      ICAPS Caps   Take 1 capsule by mouth 2 (two) times daily.      insulin NPH-insulin regular (50-50) 100 UNIT/ML injection   Commonly known as: NOVOLIN 50/50   Inject 55-80 Units into the skin 2 (two) times daily before a meal. 80 units in the am and 55 units in the pm      isosorbide mononitrate 30 MG 24 hr tablet   Commonly known as: IMDUR   Take 15 mg by mouth daily.      metoprolol tartrate 25 MG tablet   Commonly known as: LOPRESSOR   Take 12.5 mg by mouth 2 (two) times daily.      mineral oil liquid   Take 5 mLs by mouth daily as needed. For constipation      mometasone 50 MCG/ACT nasal spray   Commonly known as: NASONEX   Place 2 sprays into the nose at bedtime as needed. For congestion      omega-3 acid ethyl esters 1 G capsule   Commonly known as: LOVAZA   Take 2 g by mouth daily.  phenazopyridine 200 MG tablet   Commonly known as: PYRIDIUM   Take 200 mg by mouth every 12 (twelve) hours.      potassium chloride 10 MEQ tablet   Commonly known as: K-DUR   Take 10 mEq by mouth daily.      sodium chloride 0.65 % nasal spray   Commonly known as: OCEAN   Place 1 spray into the nose at bedtime as needed. For congestion      vitamin C 100 MG tablet   Take 100 mg by mouth daily.             Brief Admission History: Hx of CHF on home oxygen presenting with 2 weeks of lower abdominal pain and pressure with associated vaginal pain and itching. Seen by PCP and treated for UTI and improved with pyridium. Vaginal symptoms improved then returned 3 days ago. Was told that her symtoms were due to vaginal dryness . +Dysuria, no hematuria. +suprapubic pain and flank pain. No nausea, vomiting, chest pain, she has had some SOB at rest releived by receiving iv lasix in the ER , she has a hx of copd . No cough or fever. Generalized weakness with decreased PO intake for the past two days and inability to ambulate .  Hospital Course: Present on Admission:    .Candiduria .Diabetes mellitus .Hypertension .Acute renal failure .CKD (chronic kidney disease), stage III .Thrombocytopenia .Pulmonary edema .Acute combined systolic and diastolic congestive heart failure .Hyponatremia  1. Acute on chronic respiratory failure: This is secondary to pulmonary edema and the acute on chronic CHF exacerbation. Patient is on 2 L at home all the time. And this is increased to 4 on 6 L when she came in. Patient was treated with aggressive Lasix diuresis. Then she developed increase in her creatinine secondary to that. The acute renal failure resolved now as the pulmonary edema resolved. And she is on her home requirement of 2 L of oxygen.   2. Acute on chronic systolic CHF: Patient LV EF is about 37% with grade 1 diastolic dysfunction. Patient was presented to the emergency department with shortness of breath on aggressive diuresis she came in. After the patient developed acute renal failure with creatinine went up to 4.9. This is was unclear whether it's secondary to ATN versus contrast nephropathy plus aggressive diuresis. so the diuresis were started and patient received some fluids at the time of admission. The IV fluid complicated pulmonary edema, cardiology and nephrology was consulted. And then the diuresis was restarted again carefully, following the patient with fluid status daily and his renal function. The fluids balance of the day of discharge his -8.7 L. Patient will continue her medication including Lasix the time of discharge.   3. Acute renal failure on background of chronic renal sufficiency: Baseline creatinine is 1.2-1.4. As mentioned above at the time of admission patient was received Lasix and contrast. And her creatinine went up to 4.9. That was not very clear and secondary to ATN versus contrast appropriate nephropathy and aggressive diuresis. Nephrology was following the case. And when the fluid overload diuresis and following the renal function  closely was done every day. This is been resolving now and last creatinine is 1.9. Extending down consistently.   4. Abnormal troponin: Troponin went up to 0.8 during this admission. Patient was not complaining about chest pain there was no EKG changes. The increase in the troponin was thought to be secondary to the congestive heart failure and the decreased clearance of troponin by  the failed kidneys. Patient was placed on nitroglycerin at that time for both CHF and CAD.   5. Thrombocytopenia: During this admission there was moderate decline in her platelets. Last summer the plate this whether or out 200. On the day of admission platelets count 121. In they were trending down and platelets count hit a nadir of 34, hematology consulted and they recommended close followup and no need for intervention. The thrombocytopenia is likely cumulative effect of drugs adverse effects and acute illness. This improved on the day of discharge the platelet count was 102.   6. UTI: 1 patient admitted her urinalysis was consistent with UTI. Patient was started on Zosyn, the urine culture comes back negative for bacterial growth and it showed candiduria. So fluconazole was started, she received fluconazole for 7 days. It.   7. Community-acquired pneumonia: At the time of admission patient started on Zosyn and vancomycin, patient was treated for 7 days with IV antibiotics. A lot of patient's chest x-ray infiltrates were secondary to moderate edema rather than pneumonia.   8. Rapid rosponse/CODE BLUE call colon. On 04/24/2012 rapid response team was called on Mrs. Stroope, patient is trying to get off of the bed with the help of her daughter, patient got dizzy and she had to lay on the bed she was unresponsive for a brief period of time thought to be about 30 seconds by her family, patient was checked by the nurses I couldn't detect a pulse so CODE BLUE was called and the patient underwent 1 round of chest compression as part  of ACLS protocol. Patient had electrical activity on the time. In the ensuing she recovered and she turned awake and alert, she did have a dose on pressure so the CODE BLUE was terminated. The loss of consciousness is likely secondary to marked decrease in her blood pressure, which can be secondary to the aggressive diuresis. Patient was giving about 750 cc of fluid after the incident.  8. Hyponatremia: This is thought to be secondary to the diuresis with the renal dysfunction. Follow closely as outpatient. Yesterday was 130 today is 127 patient has to followup with primary care physician and Dr. Algie Coffer within one week.  Day of Discharge BP 129/73  Pulse 78  Temp(Src) 98 F (36.7 C) (Oral)  Resp 18  Ht 5\' 3"  (1.6 m)  Wt 72.3 kg (159 lb 6.3 oz)  BMI 28.24 kg/m2  SpO2 100% Physical Exam: GEN: No acute distress, cooperative with exam PSYCH: He is alert and oriented x4; does not appear anxious does not appear depressed; affect is normal  HEENT: Mucous membranes pink and anicteric;  Mouth: without oral thrush or lesions Eyes: PERRLA; EOM intact;  Neck: no cervical lymphadenopathy nor thyromegaly or carotid bruit; no JVD;  CHEST WALL: No tenderness, symmetrical to breathing bilaterally CHEST: Normal respiration, clear to auscultation bilaterally  HEART: Regular rate and rhythm; no murmurs, rubs or gallops, S1 and S2 heard  BACK: No kyphosis or scoliosis; no CVA tenderness  ABDOMEN:  soft non-tender; no masses, no organomegaly, normal abdominal bowel sounds; no pannus; no intertriginous candida.  EXTREMITIES: No bone or joint deformity; no edema; no ulcerations.  PULSES: 2+ and symmetric, neurovascularity is intact SKIN: Normal hydration no rash or ulceration, no flushing or suspicious lesions  CNS: Cranial nerves 2-12 grossly intact no focal neurologic deficit, coordination is intact gait not tested    Results for orders placed during the hospital encounter of 06/20/11 (from the past 24  hour(s))  GLUCOSE,  CAPILLARY     Status: Abnormal   Collection Time   06/29/11 11:52 AM      Component Value Range   Glucose-Capillary 220 (*) 70 - 99 (mg/dL)   Comment 1 Notify RN    GLUCOSE, CAPILLARY     Status: Abnormal   Collection Time   06/29/11  4:41 PM      Component Value Range   Glucose-Capillary 246 (*) 70 - 99 (mg/dL)  GLUCOSE, CAPILLARY     Status: Abnormal   Collection Time   06/29/11 10:42 PM      Component Value Range   Glucose-Capillary 225 (*) 70 - 99 (mg/dL)  CBC     Status: Abnormal   Collection Time   06/30/11  4:18 AM      Component Value Range   WBC 10.9 (*) 4.0 - 10.5 (K/uL)   RBC 3.30 (*) 3.87 - 5.11 (MIL/uL)   Hemoglobin 9.3 (*) 12.0 - 15.0 (g/dL)   HCT 16.1 (*) 09.6 - 46.0 (%)   MCV 87.9  78.0 - 100.0 (fL)   MCH 28.2  26.0 - 34.0 (pg)   MCHC 32.1  30.0 - 36.0 (g/dL)   RDW 04.5 (*) 40.9 - 15.5 (%)   Platelets 93 (*) 150 - 400 (K/uL)  BASIC METABOLIC PANEL     Status: Abnormal   Collection Time   06/30/11  4:18 AM      Component Value Range   Sodium 127 (*) 135 - 145 (mEq/L)   Potassium 4.6  3.5 - 5.1 (mEq/L)   Chloride 92 (*) 96 - 112 (mEq/L)   CO2 24  19 - 32 (mEq/L)   Glucose, Bld 208 (*) 70 - 99 (mg/dL)   BUN 62 (*) 6 - 23 (mg/dL)   Creatinine, Ser 8.11 (*) 0.50 - 1.10 (mg/dL)   Calcium 8.2 (*) 8.4 - 10.5 (mg/dL)   GFR calc non Af Amer 21 (*) >90 (mL/min)   GFR calc Af Amer 24 (*) >90 (mL/min)  GLUCOSE, CAPILLARY     Status: Abnormal   Collection Time   06/30/11  8:11 AM      Component Value Range   Glucose-Capillary 179 (*) 70 - 99 (mg/dL)    Disposition: Home   Follow-up Appts: Discharge Orders    Future Orders Please Complete By Expires   Diet - low sodium heart healthy      Diet Carb Modified      Increase activity slowly         Follow-up Information    Follow up with [facility] S, MD. Schedule an appointment as soon as possible for a visit in 1 week.   Contact information:   [address] [place]  [place] [address] [phone]       Follow up with Laurena Slimmer, MD. Schedule an appointment as soon as possible for a visit in 1 week.   Contact information:   [address], [address] [place] [address] [phone]          I spent 40 minutes completing paperwork and coordinating discharge efforts.  SignedClydia Llano A 06/30/2011, 11:22 AM

## 2011-06-30 NOTE — Progress Notes (Signed)
Physical Therapy Treatment Patient Details Name: [name] MRN: [id number] DOB: 06-05-1925 Today's Date: 06/30/2011  PT Assessment/Plan  PT - Assessment/Plan Comments on Treatment Session: MD requesting ambulation today prior to D/C home. Pt's daughter requested PT ambulate with pt in room and that she and sister will walk with pt later today. Possible D/C home-continue to recommend HHPT and 24 hour supervision.  PT Plan: Discharge plan remains appropriate Follow Up Recommendations: Home health PT;Supervision/Assistance - 24 hour Equipment Recommended: None recommended by PT PT Goals  Acute Rehab PT Goals PT Goal: Supine/Side to Sit - Progress: Progressing toward goal PT Goal: Sit to Supine/Side - Progress: Progressing toward goal PT Goal: Sit to Stand - Progress: Progressing toward goal PT Goal: Stand to Sit - Progress: Progressing toward goal PT Goal: Ambulate - Progress: Progressing toward goal  PT Treatment Precautions/Restrictions  Precautions Precautions: Fall Restrictions Weight Bearing Restrictions: No Mobility (including Balance) Bed Mobility Bed Mobility: Yes Supine to Sit: 3: Mod assist;HOB flat;With rails Supine to Sit Details (indicate cue type and reason): Multi-modal cues for safety, technique. Pt initiated task well but had difficulty following through and completing task. Assist for trunk to upright and utilized bedpad for scooting to EOB.  Sit to Supine: 3: Mod assist;HOB flat;With rail Sit to Supine - Details (indicate cue type and reason): Assist for trunk to supine and bil LEs onto bed.  Transfers Transfers: Yes Sit to Stand: 3: Mod assist;From bed;With upper extremity assist Sit to Stand Details (indicate cue type and reason): Multi-modal cues for safety, technique, hand placement. Assist to rise, stabilize.  Stand to Sit: To bed;4: Min assist;With upper extremity assist Stand to Sit Details: Multi-modal cues for safety, hand placement. Increased time  due to pt attempting to sit prematurely.  Ambulation/Gait Ambulation/Gait: Yes Ambulation/Gait Assistance: 4: Min assist Ambulation/Gait Assistance Details (indicate cue type and reason): Multi-modal cues for safety, technique. Assist to stabilze pt and negotiate with RW. Stop and go gait pattern with MAX verbal cues throughout.  Ambulation Distance (Feet): 15 Feet Assistive device: Rolling walker Gait Pattern: Step-through pattern;Decreased step length - right;Decreased step length - left;Decreased stride length;Trunk flexed  Balance Balance Assessed: Yes Static Sitting Balance Static Sitting - Balance Support: No upper extremity supported;Feet supported Static Sitting - Level of Assistance: 4: Min assist Static Sitting - Comment/# of Minutes: Sat EOB 3-4 minutes. Pt leaning posteriorly. VCs/tactile cues for upright positioning and stabilization in sitting Exercise    End of Session PT - End of Session Activity Tolerance: Patient limited by fatigue Patient left: in bed;with call bell in reach;with family/visitor present General Behavior During Session: Lethargic Cognition: Impaired, at baseline  [name] 06/30/2011, 10:15 AM [phone]

## 2011-06-30 NOTE — Progress Notes (Signed)
Occupational Therapy Treatment Patient Details Name: [name] MRN: [id number] DOB: 1925/11/09 Today's Date: 06/30/2011  OT Assessment/Plan OT Assessment/Plan OT Plan: Discharge plan remains appropriate OT Frequency: Min 2X/week Follow Up Recommendations: Home health OT;Supervision/Assistance - 24 hour Equipment Recommended: None recommended by OT OT Goals ADL Goals Pt Will Transfer to Toilet: with supervision;Stand pivot transfer;Ambulation;3-in-1 ADL Goal: Toilet Transfer - Progress: Progressing toward goals--progress is slow; daughters present and very hands on Pt Will Perform Toileting - Hygiene: with supervision;Sit to stand from 3-in-1/toilet ADL Goal: Toileting - Hygiene - Progress: Progressing toward goals  OT Treatment Precautions/Restrictions  Precautions Precautions: Fall Restrictions Weight Bearing Restrictions: No   ADL ADL Toilet Transfer: Performed;Minimal assistance;Other (comment) (mod a for sit to stand; min cues for hands/stepping) Toilet Transfer Details (indicate cue type and reason): min multimodal cues Toilet Transfer Method: Stand pivot Toileting - Hygiene: Performed;+1 Total assistance;Comment for patient % (0) Where Assessed - Toileting Hygiene: Standing ADL Comments: daughters present and helpful.  Used pt's 4 wheel walker Mobility  Transfers Sit to Stand: 3: Mod assist Sit to Stand Details (indicate cue type and reason): min multimodal cues  Exercises    End of Session General Behavior During Session: Lethargic Cognition: Impaired, at baseline Marica Otter, OTR/L [phone] 06/30/2011 Shaunna Rosetti  06/30/2011, 11:44 AM

## 2011-07-07 ENCOUNTER — Ambulatory Visit (HOSPITAL_COMMUNITY)
Admission: RE | Admit: 2011-07-07 | Discharge: 2011-07-07 | Disposition: A | Discharge status: Home / Self Care | Payer: PRIVATE HEALTH INSURANCE | Source: Ambulatory Visit | Attending: Internal Medicine | Admitting: Internal Medicine

## 2011-07-07 ENCOUNTER — Other Ambulatory Visit: Payer: Self-pay | Admitting: Internal Medicine

## 2011-07-07 DIAGNOSIS — W19XXXA Unspecified fall, initial encounter: Secondary | ICD-10-CM

## 2011-07-07 DIAGNOSIS — M546 Pain in thoracic spine: Secondary | ICD-10-CM | POA: Insufficient documentation

## 2011-07-12 ENCOUNTER — Inpatient Hospital Stay (HOSPITAL_COMMUNITY): Payer: PRIVATE HEALTH INSURANCE | Admitting: Anesthesiology

## 2011-07-12 ENCOUNTER — Encounter (HOSPITAL_COMMUNITY)
Admission: EM | Disposition: A | Discharge status: Home Health | Payer: Self-pay | Source: Home / Self Care | Attending: Internal Medicine

## 2011-07-12 ENCOUNTER — Encounter (HOSPITAL_COMMUNITY): Payer: Self-pay | Admitting: Anesthesiology

## 2011-07-12 ENCOUNTER — Inpatient Hospital Stay (HOSPITAL_COMMUNITY): Payer: PRIVATE HEALTH INSURANCE

## 2011-07-12 ENCOUNTER — Inpatient Hospital Stay (HOSPITAL_COMMUNITY)
Admission: EM | Admit: 2011-07-12 | Discharge: 2011-07-20 | DRG: 330 | Disposition: A | Discharge status: Home Health | Payer: PRIVATE HEALTH INSURANCE | Attending: Internal Medicine | Admitting: Internal Medicine

## 2011-07-12 ENCOUNTER — Other Ambulatory Visit: Payer: Self-pay

## 2011-07-12 ENCOUNTER — Other Ambulatory Visit (INDEPENDENT_AMBULATORY_CARE_PROVIDER_SITE_OTHER): Payer: Self-pay | Admitting: Surgery

## 2011-07-12 ENCOUNTER — Encounter (HOSPITAL_COMMUNITY): Payer: Self-pay | Admitting: *Deleted

## 2011-07-12 DIAGNOSIS — I5041 Acute combined systolic (congestive) and diastolic (congestive) heart failure: Secondary | ICD-10-CM

## 2011-07-12 DIAGNOSIS — N179 Acute kidney failure, unspecified: Secondary | ICD-10-CM

## 2011-07-12 DIAGNOSIS — F0390 Unspecified dementia without behavioral disturbance: Secondary | ICD-10-CM | POA: Diagnosis present

## 2011-07-12 DIAGNOSIS — K2970 Gastritis, unspecified, without bleeding: Secondary | ICD-10-CM | POA: Diagnosis present

## 2011-07-12 DIAGNOSIS — I059 Rheumatic mitral valve disease, unspecified: Secondary | ICD-10-CM | POA: Diagnosis present

## 2011-07-12 DIAGNOSIS — M545 Low back pain, unspecified: Secondary | ICD-10-CM

## 2011-07-12 DIAGNOSIS — Z888 Allergy status to other drugs, medicaments and biological substances status: Secondary | ICD-10-CM

## 2011-07-12 DIAGNOSIS — E871 Hypo-osmolality and hyponatremia: Secondary | ICD-10-CM

## 2011-07-12 DIAGNOSIS — K2990 Gastroduodenitis, unspecified, without bleeding: Secondary | ICD-10-CM | POA: Diagnosis present

## 2011-07-12 DIAGNOSIS — D696 Thrombocytopenia, unspecified: Secondary | ICD-10-CM | POA: Diagnosis present

## 2011-07-12 DIAGNOSIS — I509 Heart failure, unspecified: Secondary | ICD-10-CM | POA: Diagnosis present

## 2011-07-12 DIAGNOSIS — B3749 Other urogenital candidiasis: Secondary | ICD-10-CM

## 2011-07-12 DIAGNOSIS — Z794 Long term (current) use of insulin: Secondary | ICD-10-CM

## 2011-07-12 DIAGNOSIS — E861 Hypovolemia: Secondary | ICD-10-CM | POA: Diagnosis present

## 2011-07-12 DIAGNOSIS — K3184 Gastroparesis: Secondary | ICD-10-CM | POA: Diagnosis present

## 2011-07-12 DIAGNOSIS — N183 Chronic kidney disease, stage 3 unspecified: Secondary | ICD-10-CM | POA: Diagnosis present

## 2011-07-12 DIAGNOSIS — K560 Paralytic ileus: Secondary | ICD-10-CM | POA: Diagnosis not present

## 2011-07-12 DIAGNOSIS — I252 Old myocardial infarction: Secondary | ICD-10-CM

## 2011-07-12 DIAGNOSIS — R1310 Dysphagia, unspecified: Secondary | ICD-10-CM | POA: Diagnosis present

## 2011-07-12 DIAGNOSIS — Z8673 Personal history of transient ischemic attack (TIA), and cerebral infarction without residual deficits: Secondary | ICD-10-CM

## 2011-07-12 DIAGNOSIS — Z86711 Personal history of pulmonary embolism: Secondary | ICD-10-CM

## 2011-07-12 DIAGNOSIS — K922 Gastrointestinal hemorrhage, unspecified: Secondary | ICD-10-CM

## 2011-07-12 DIAGNOSIS — I447 Left bundle-branch block, unspecified: Secondary | ICD-10-CM

## 2011-07-12 DIAGNOSIS — D62 Acute posthemorrhagic anemia: Secondary | ICD-10-CM | POA: Diagnosis present

## 2011-07-12 DIAGNOSIS — J449 Chronic obstructive pulmonary disease, unspecified: Secondary | ICD-10-CM | POA: Diagnosis present

## 2011-07-12 DIAGNOSIS — H543 Unqualified visual loss, both eyes: Secondary | ICD-10-CM | POA: Diagnosis present

## 2011-07-12 DIAGNOSIS — Z883 Allergy status to other anti-infective agents status: Secondary | ICD-10-CM

## 2011-07-12 DIAGNOSIS — J4489 Other specified chronic obstructive pulmonary disease: Secondary | ICD-10-CM | POA: Diagnosis present

## 2011-07-12 DIAGNOSIS — Z7982 Long term (current) use of aspirin: Secondary | ICD-10-CM

## 2011-07-12 DIAGNOSIS — I2510 Atherosclerotic heart disease of native coronary artery without angina pectoris: Secondary | ICD-10-CM | POA: Diagnosis present

## 2011-07-12 DIAGNOSIS — J9600 Acute respiratory failure, unspecified whether with hypoxia or hypercapnia: Secondary | ICD-10-CM

## 2011-07-12 DIAGNOSIS — I129 Hypertensive chronic kidney disease with stage 1 through stage 4 chronic kidney disease, or unspecified chronic kidney disease: Secondary | ICD-10-CM | POA: Diagnosis present

## 2011-07-12 DIAGNOSIS — K5732 Diverticulitis of large intestine without perforation or abscess without bleeding: Secondary | ICD-10-CM

## 2011-07-12 DIAGNOSIS — Z86718 Personal history of other venous thrombosis and embolism: Secondary | ICD-10-CM

## 2011-07-12 DIAGNOSIS — K630 Abscess of intestine: Secondary | ICD-10-CM

## 2011-07-12 DIAGNOSIS — J690 Pneumonitis due to inhalation of food and vomit: Secondary | ICD-10-CM

## 2011-07-12 DIAGNOSIS — K219 Gastro-esophageal reflux disease without esophagitis: Secondary | ICD-10-CM | POA: Diagnosis present

## 2011-07-12 DIAGNOSIS — I10 Essential (primary) hypertension: Secondary | ICD-10-CM | POA: Diagnosis present

## 2011-07-12 DIAGNOSIS — I5022 Chronic systolic (congestive) heart failure: Secondary | ICD-10-CM | POA: Diagnosis present

## 2011-07-12 DIAGNOSIS — K648 Other hemorrhoids: Secondary | ICD-10-CM | POA: Diagnosis present

## 2011-07-12 DIAGNOSIS — I2589 Other forms of chronic ischemic heart disease: Secondary | ICD-10-CM | POA: Diagnosis present

## 2011-07-12 DIAGNOSIS — E875 Hyperkalemia: Secondary | ICD-10-CM | POA: Diagnosis present

## 2011-07-12 DIAGNOSIS — I959 Hypotension, unspecified: Secondary | ICD-10-CM | POA: Diagnosis present

## 2011-07-12 DIAGNOSIS — R7401 Elevation of levels of liver transaminase levels: Secondary | ICD-10-CM

## 2011-07-12 DIAGNOSIS — K5731 Diverticulosis of large intestine without perforation or abscess with bleeding: Principal | ICD-10-CM | POA: Diagnosis present

## 2011-07-12 DIAGNOSIS — J811 Chronic pulmonary edema: Secondary | ICD-10-CM

## 2011-07-12 DIAGNOSIS — E119 Type 2 diabetes mellitus without complications: Secondary | ICD-10-CM | POA: Diagnosis present

## 2011-07-12 HISTORY — DX: Gastro-esophageal reflux disease without esophagitis: K21.9

## 2011-07-12 HISTORY — DX: Acute myocardial infarction, unspecified: I21.9

## 2011-07-12 HISTORY — DX: Cerebral infarction, unspecified: I63.9

## 2011-07-12 HISTORY — PX: LAPAROTOMY: SHX154

## 2011-07-12 HISTORY — DX: Chronic obstructive pulmonary disease, unspecified: J44.9

## 2011-07-12 HISTORY — PX: FLEXIBLE SIGMOIDOSCOPY: SHX5431

## 2011-07-12 HISTORY — PX: ESOPHAGOGASTRODUODENOSCOPY: SHX5428

## 2011-07-12 HISTORY — DX: Cystitis, unspecified without hematuria: N30.90

## 2011-07-12 LAB — BASIC METABOLIC PANEL
CO2: 23 mEq/L (ref 19–32)
Calcium: 6.7 mg/dL — ABNORMAL LOW (ref 8.4–10.5)
Creatinine, Ser: 1.37 mg/dL — ABNORMAL HIGH (ref 0.50–1.10)
GFR calc non Af Amer: 35 mL/min — ABNORMAL LOW (ref >90)
Glucose, Bld: 233 mg/dL — ABNORMAL HIGH (ref 70–99)

## 2011-07-12 LAB — DIFFERENTIAL
Basophils Relative: 1 % (ref 0–1)
Basophils Relative: 0 % (ref 0–1)
Eosinophils Absolute: 0 10*3/uL (ref 0.0–0.7)
Eosinophils Relative: 2 % (ref 0–5)
Eosinophils Relative: 0 % (ref 0–5)
Lymphocytes Relative: 10 % — ABNORMAL LOW (ref 12–46)
Monocytes Absolute: 0.7 10*3/uL (ref 0.1–1.0)
Monocytes Relative: 4 % (ref 3–12)
Neutro Abs: 5.6 10*3/uL (ref 1.7–7.7)
Neutro Abs: 6.7 10*3/uL (ref 1.7–7.7)
Neutrophils Relative %: 69 % (ref 43–77)

## 2011-07-12 LAB — COMPREHENSIVE METABOLIC PANEL
ALT: 163 U/L — ABNORMAL HIGH (ref 0–35)
AST: 52 U/L — ABNORMAL HIGH (ref 0–37)
CO2: 27 mEq/L (ref 19–32)
Chloride: 101 mEq/L (ref 96–112)
Creatinine, Ser: 1.67 mg/dL — ABNORMAL HIGH (ref 0.50–1.10)
GFR calc non Af Amer: 27 mL/min — ABNORMAL LOW (ref >90)
Total Bilirubin: 1.5 mg/dL — ABNORMAL HIGH (ref 0.3–1.2)

## 2011-07-12 LAB — GLUCOSE, CAPILLARY
Glucose-Capillary: 194 mg/dL — ABNORMAL HIGH (ref 70–99)
Glucose-Capillary: 207 mg/dL — ABNORMAL HIGH (ref 70–99)
Glucose-Capillary: 235 mg/dL — ABNORMAL HIGH (ref 70–99)
Glucose-Capillary: 238 mg/dL — ABNORMAL HIGH (ref 70–99)

## 2011-07-12 LAB — CBC
HCT: 38.6 % (ref 36.0–46.0)
HCT: 25.3 % — ABNORMAL LOW (ref 36.0–46.0)
Hemoglobin: 12.1 g/dL (ref 12.0–15.0)
Hemoglobin: 8.4 g/dL — ABNORMAL LOW (ref 12.0–15.0)
MCH: 29.1 pg (ref 26.0–34.0)
MCHC: 33.2 g/dL (ref 30.0–36.0)
MCHC: 33.2 g/dL (ref 30.0–36.0)
MCV: 92.8 fL (ref 78.0–100.0)
Platelets: 113 10*3/uL — ABNORMAL LOW (ref 150–400)
RBC: 4.16 MIL/uL (ref 3.87–5.11)
RBC: 2.83 MIL/uL — ABNORMAL LOW (ref 3.87–5.11)
RDW: 18 % — ABNORMAL HIGH (ref 11.5–15.5)

## 2011-07-12 LAB — IRON AND TIBC
Iron: 32 ug/dL — ABNORMAL LOW (ref 42–135)
Saturation Ratios: 11 % — ABNORMAL LOW (ref 20–55)
TIBC: 279 ug/dL (ref 250–470)
UIBC: 247 ug/dL (ref 125–400)

## 2011-07-12 LAB — CARDIAC PANEL(CRET KIN+CKTOT+MB+TROPI)
CK, MB: 4.6 ng/mL — ABNORMAL HIGH (ref 0.3–4.0)
Total CK: 106 U/L (ref 7–177)

## 2011-07-12 LAB — APTT: aPTT: 31 seconds (ref 24–37)

## 2011-07-12 LAB — RETICULOCYTES: Retic Count, Absolute: 136.3 10*3/uL (ref 19.0–186.0)

## 2011-07-12 SURGERY — EGD (ESOPHAGOGASTRODUODENOSCOPY)
Anesthesia: Moderate Sedation

## 2011-07-12 SURGERY — LAPAROTOMY, EXPLORATORY
Anesthesia: General | Site: Abdomen | Wound class: Clean Contaminated

## 2011-07-12 MED ORDER — METOPROLOL TARTRATE 1 MG/ML IV SOLN
5.0000 mg | Freq: Three times a day (TID) | INTRAVENOUS | Status: DC
  Filled 2011-07-12 (×3): qty 5

## 2011-07-12 MED ORDER — DIGOXIN 0.25 MG/ML IJ SOLN
0.1250 mg | Freq: Every day | INTRAMUSCULAR | Status: DC
  Administered 2011-07-12 – 2011-07-13 (×2): 0.125 mg via INTRAVENOUS
  Filled 2011-07-12 (×6): qty 0.5

## 2011-07-12 MED ORDER — MORPHINE SULFATE 2 MG/ML IJ SOLN
2.0000 mg | INTRAMUSCULAR | Status: DC | PRN
  Administered 2011-07-13 (×5): 2 mg via INTRAVENOUS
  Filled 2011-07-12 (×5): qty 1

## 2011-07-12 MED ORDER — DEXTROSE 5 % IV SOLN
1.0000 g | Freq: Four times a day (QID) | INTRAVENOUS | Status: AC
  Administered 2011-07-12 – 2011-07-13 (×3): 1 g via INTRAVENOUS
  Filled 2011-07-12 (×4): qty 1

## 2011-07-12 MED ORDER — SUCCINYLCHOLINE CHLORIDE 20 MG/ML IJ SOLN
INTRAMUSCULAR | Status: DC | PRN
  Administered 2011-07-12: 100 mg via INTRAVENOUS

## 2011-07-12 MED ORDER — SALINE NASAL SPRAY 0.65 % NA SOLN: 1.0000 | Freq: Every evening | NASAL | Status: DC | PRN

## 2011-07-12 MED ORDER — ONDANSETRON HCL 4 MG/2ML IJ SOLN
INTRAMUSCULAR | Status: DC | PRN
  Administered 2011-07-12: 4 mg via INTRAVENOUS

## 2011-07-12 MED ORDER — SODIUM CHLORIDE 0.9 % IV SOLN
INTRAVENOUS | Status: DC | PRN
  Administered 2011-07-12: 19:00:00 via INTRAVENOUS

## 2011-07-12 MED ORDER — CISATRACURIUM BESYLATE 2 MG/ML IV SOLN
INTRAVENOUS | Status: DC | PRN
  Administered 2011-07-12: 4 mg via INTRAVENOUS
  Administered 2011-07-12: 8 mg via INTRAVENOUS

## 2011-07-12 MED ORDER — PROPOFOL 10 MG/ML IV EMUL
INTRAVENOUS | Status: DC | PRN
  Administered 2011-07-12: 70 mg via INTRAVENOUS

## 2011-07-12 MED ORDER — FENTANYL CITRATE 0.05 MG/ML IJ SOLN
INTRAMUSCULAR | Status: AC
  Filled 2011-07-12: qty 2

## 2011-07-12 MED ORDER — NEOSTIGMINE METHYLSULFATE 1 MG/ML IJ SOLN
INTRAMUSCULAR | Status: DC | PRN
  Administered 2011-07-12: 4 mg via INTRAVENOUS

## 2011-07-12 MED ORDER — INSULIN ASPART 100 UNIT/ML ~~LOC~~ SOLN
SUBCUTANEOUS | Status: AC
  Filled 2011-07-12: qty 1

## 2011-07-12 MED ORDER — SODIUM CHLORIDE 0.9 % IV SOLN: Freq: Once | INTRAVENOUS | Status: DC

## 2011-07-12 MED ORDER — CEFOXITIN SODIUM-DEXTROSE 1-4 GM-% IV SOLR (PREMIX)
INTRAVENOUS | Status: AC
  Filled 2011-07-12: qty 50

## 2011-07-12 MED ORDER — DEXTROSE-NACL 5-0.45 % IV SOLN
INTRAVENOUS | Status: DC
  Administered 2011-07-12 – 2011-07-13 (×2): 20 mL/h via INTRAVENOUS
  Administered 2011-07-15: 03:00:00 via INTRAVENOUS
  Administered 2011-07-19: 20 mL/h via INTRAVENOUS

## 2011-07-12 MED ORDER — MEPERIDINE HCL 50 MG/ML IJ SOLN: 6.2500 mg | INTRAMUSCULAR | Status: DC | PRN

## 2011-07-12 MED ORDER — POTASSIUM CHLORIDE IN NACL 20-0.45 MEQ/L-% IV SOLN
INTRAVENOUS | Status: DC
  Administered 2011-07-12: 125 mL/h via INTRAVENOUS
  Administered 2011-07-13: 75 mL/h via INTRAVENOUS
  Administered 2011-07-13: 22:00:00 via INTRAVENOUS
  Filled 2011-07-12 (×9): qty 1000

## 2011-07-12 MED ORDER — SODIUM CHLORIDE 0.9 % IV SOLN
INTRAVENOUS | Status: DC | PRN
  Administered 2011-07-12: 18:00:00 via INTRAVENOUS

## 2011-07-12 MED ORDER — SODIUM CHLORIDE 0.9 % IV SOLN
INTRAVENOUS | Status: DC
  Administered 2011-07-12: 1.3 [IU]/h via INTRAVENOUS
  Administered 2011-07-13: 5.1 [IU]/h via INTRAVENOUS
  Filled 2011-07-12: qty 1

## 2011-07-12 MED ORDER — FENTANYL CITRATE 0.05 MG/ML IJ SOLN
INTRAMUSCULAR | Status: DC | PRN
  Administered 2011-07-12 (×5): 50 ug via INTRAVENOUS

## 2011-07-12 MED ORDER — SODIUM CHLORIDE 0.9 % IJ SOLN: 3.0000 mL | Freq: Two times a day (BID) | INTRAMUSCULAR | Status: DC

## 2011-07-12 MED ORDER — FLUTICASONE PROPIONATE 50 MCG/ACT NA SUSP
2.0000 | Freq: Every day | NASAL | Status: DC
  Filled 2011-07-12: qty 16

## 2011-07-12 MED ORDER — LACTATED RINGERS IV SOLN: INTRAVENOUS | Status: DC

## 2011-07-12 MED ORDER — SODIUM CHLORIDE 0.9 % IR SOLN
Status: DC | PRN
  Administered 2011-07-12: 1000 mL

## 2011-07-12 MED ORDER — FUROSEMIDE 10 MG/ML IJ SOLN
20.0000 mg | Freq: Two times a day (BID) | INTRAMUSCULAR | Status: DC
  Administered 2011-07-13 – 2011-07-15 (×5): 20 mg via INTRAVENOUS
  Filled 2011-07-12 (×9): qty 2

## 2011-07-12 MED ORDER — PROMETHAZINE HCL 25 MG/ML IJ SOLN: 6.2500 mg | INTRAMUSCULAR | Status: DC | PRN

## 2011-07-12 MED ORDER — FUROSEMIDE 10 MG/ML IJ SOLN: 20.0000 mg | Freq: Once | INTRAMUSCULAR | Status: DC

## 2011-07-12 MED ORDER — GLYCOPYRROLATE 0.2 MG/ML IJ SOLN
INTRAMUSCULAR | Status: DC | PRN
  Administered 2011-07-12: .6 mg via INTRAVENOUS

## 2011-07-12 MED ORDER — TECHNETIUM TC 99M-LABELED RED BLOOD CELLS IV KIT
23.8000 | PACK | Freq: Once | INTRAVENOUS | Status: AC | PRN
  Administered 2011-07-12: 23.8 via INTRAVENOUS

## 2011-07-12 MED ORDER — EPHEDRINE SULFATE 50 MG/ML IJ SOLN
INTRAMUSCULAR | Status: DC | PRN
  Administered 2011-07-12: 15 mg via INTRAVENOUS
  Administered 2011-07-12 (×2): 5 mg via INTRAVENOUS

## 2011-07-12 MED ORDER — SALINE SPRAY 0.65 % NA SOLN
1.0000 | Freq: Every evening | NASAL | Status: DC | PRN
  Filled 2011-07-12: qty 44

## 2011-07-12 MED ORDER — INSULIN REGULAR BOLUS VIA INFUSION
0.0000 [IU] | Freq: Three times a day (TID) | INTRAVENOUS | Status: DC
  Filled 2011-07-12: qty 10

## 2011-07-12 MED ORDER — SODIUM CHLORIDE 0.9 % IV BOLUS (SEPSIS)
500.0000 mL | Freq: Once | INTRAVENOUS | Status: AC
  Administered 2011-07-12: 500 mL via INTRAVENOUS

## 2011-07-12 MED ORDER — LACTATED RINGERS IV SOLN
INTRAVENOUS | Status: DC | PRN
  Administered 2011-07-12: 20:00:00 via INTRAVENOUS

## 2011-07-12 MED ORDER — SODIUM CHLORIDE 0.9 % IV SOLN: INTRAVENOUS | Status: DC

## 2011-07-12 MED ORDER — LIDOCAINE HCL (CARDIAC) 20 MG/ML IV SOLN
INTRAVENOUS | Status: DC | PRN
  Administered 2011-07-12: 40 mg via INTRAVENOUS

## 2011-07-12 MED ORDER — PHENYLEPHRINE HCL 10 MG/ML IJ SOLN
10.0000 mg | INTRAVENOUS | Status: DC | PRN
  Administered 2011-07-12: 20 ug/min via INTRAVENOUS

## 2011-07-12 MED ORDER — HYDROMORPHONE HCL PF 1 MG/ML IJ SOLN: 0.2500 mg | INTRAMUSCULAR | Status: DC | PRN

## 2011-07-12 MED ORDER — ONDANSETRON HCL 4 MG/2ML IJ SOLN: 4.0000 mg | Freq: Four times a day (QID) | INTRAMUSCULAR | Status: DC | PRN

## 2011-07-12 MED ORDER — ETOMIDATE 2 MG/ML IV SOLN
INTRAVENOUS | Status: DC | PRN
  Administered 2011-07-12: 6 mg via INTRAVENOUS

## 2011-07-12 MED ORDER — INSULIN ASPART 100 UNIT/ML ~~LOC~~ SOLN
0.0000 [IU] | Freq: Three times a day (TID) | SUBCUTANEOUS | Status: DC
  Administered 2011-07-12: 5 [IU] via SUBCUTANEOUS
  Administered 2011-07-12: 3 [IU] via SUBCUTANEOUS
  Filled 2011-07-12: qty 3

## 2011-07-12 MED ORDER — MIDAZOLAM HCL 10 MG/2ML IJ SOLN
INTRAMUSCULAR | Status: DC | PRN
  Administered 2011-07-12: 1 mg via INTRAVENOUS

## 2011-07-12 MED ORDER — SODIUM CHLORIDE 0.9 % IV BOLUS (SEPSIS)
1000.0000 mL | Freq: Once | INTRAVENOUS | Status: AC
  Administered 2011-07-12: 1000 mL via INTRAVENOUS

## 2011-07-12 MED ORDER — LISINOPRIL 2.5 MG PO TABS
2.5000 mg | ORAL_TABLET | Freq: Every day | ORAL | Status: DC
Start: 2011-07-13 — End: 2011-07-13
  Filled 2011-07-12: qty 1

## 2011-07-12 MED ORDER — MIDAZOLAM HCL 10 MG/2ML IJ SOLN
INTRAMUSCULAR | Status: AC
  Filled 2011-07-12: qty 2

## 2011-07-12 MED ORDER — ONDANSETRON HCL 4 MG PO TABS
4.0000 mg | ORAL_TABLET | Freq: Four times a day (QID) | ORAL | Status: DC | PRN
  Filled 2011-07-12: qty 1

## 2011-07-12 MED ORDER — CEFOXITIN SODIUM 1 G IV SOLR
1.0000 g | INTRAVENOUS | Status: DC | PRN
  Administered 2011-07-12: 1 g via INTRAVENOUS

## 2011-07-12 MED ORDER — DEXTROSE 50 % IV SOLN: 25.0000 mL | INTRAVENOUS | Status: DC | PRN

## 2011-07-12 MED ORDER — FENTANYL NICU IV SYRINGE 50 MCG/ML
INJECTION | INTRAMUSCULAR | Status: DC | PRN
  Administered 2011-07-12: 12.5 ug via INTRAVENOUS

## 2011-07-12 SURGICAL SUPPLY — 52 items
APPLICATOR COTTON TIP 6IN STRL (MISCELLANEOUS) ×2 IMPLANT
BLADE EXTENDED COATED 6.5IN (ELECTRODE) ×2 IMPLANT
BLADE HEX COATED 2.75 (ELECTRODE) ×2 IMPLANT
CANISTER SUCTION 2500CC (MISCELLANEOUS) IMPLANT
CLAMP POUCH DRAINAGE QUIET (OSTOMY) ×2 IMPLANT
CLOTH BEACON ORANGE TIMEOUT ST (SAFETY) ×2 IMPLANT
COVER MAYO STAND STRL (DRAPES) ×2 IMPLANT
DRAPE LAPAROSCOPIC ABDOMINAL (DRAPES) IMPLANT
DRAPE LG THREE QUARTER DISP (DRAPES) ×2 IMPLANT
DRAPE WARM FLUID 44X44 (DRAPE) ×2 IMPLANT
DRESSING TELFA 8X3 (GAUZE/BANDAGES/DRESSINGS) ×2 IMPLANT
DRSG PAD ABDOMINAL 8X10 ST (GAUZE/BANDAGES/DRESSINGS) ×2 IMPLANT
ELECT REM PT RETURN 9FT ADLT (ELECTROSURGICAL) ×2
ELECTRODE REM PT RTRN 9FT ADLT (ELECTROSURGICAL) ×1 IMPLANT
GAUZE SPONGE 4X4 12PLY STRL LF (GAUZE/BANDAGES/DRESSINGS) ×2 IMPLANT
GLOVE BIOGEL PI IND STRL 7.0 (GLOVE) ×1 IMPLANT
GLOVE BIOGEL PI INDICATOR 7.0 (GLOVE) ×1
GLOVE SURG SIGNA 7.5 PF LTX (GLOVE) ×2 IMPLANT
GOWN STRL NON-REIN LRG LVL3 (GOWN DISPOSABLE) ×6 IMPLANT
GOWN STRL REIN XL XLG (GOWN DISPOSABLE) ×4 IMPLANT
KIT BASIN OR (CUSTOM PROCEDURE TRAY) ×2 IMPLANT
LIGASURE IMPACT 36 18CM CVD LR (INSTRUMENTS) ×2 IMPLANT
MANIFOLD NEPTUNE II (INSTRUMENTS) ×2 IMPLANT
NS IRRIG 1000ML POUR BTL (IV SOLUTION) ×8 IMPLANT
PACK GENERAL/GYN (CUSTOM PROCEDURE TRAY) ×2 IMPLANT
POUCH OSTOMY 2  H (OSTOMY) ×2 IMPLANT
RELOAD PROXIMATE 75MM BLUE (ENDOMECHANICALS) ×4 IMPLANT
SHEET LAVH (DRAPES) ×2 IMPLANT
SPONGE GAUZE 4X4 12PLY (GAUZE/BANDAGES/DRESSINGS) ×2 IMPLANT
SPONGE LAP 18X18 X RAY DECT (DISPOSABLE) ×2 IMPLANT
STAPLER PROXIMATE 75MM BLUE (STAPLE) ×2 IMPLANT
STAPLER VISISTAT 35W (STAPLE) ×2 IMPLANT
SUCTION POOLE TIP (SUCTIONS) ×2 IMPLANT
SUT NOVA 1 T20/GS 25DT (SUTURE) ×2 IMPLANT
SUT NOVA NAB GS-21 0 18 T12 DT (SUTURE) ×2 IMPLANT
SUT PDS AB 1 CTX 36 (SUTURE) IMPLANT
SUT PDS AB 1 TP1 54 (SUTURE) ×4 IMPLANT
SUT PROLENE 0 SH 30 (SUTURE) ×2 IMPLANT
SUT SILK 2 0 (SUTURE) ×1
SUT SILK 2 0 SH CR/8 (SUTURE) ×2 IMPLANT
SUT SILK 2-0 18XBRD TIE 12 (SUTURE) ×1 IMPLANT
SUT SILK 3 0 (SUTURE) ×1
SUT SILK 3 0 SH CR/8 (SUTURE) ×2 IMPLANT
SUT SILK 3-0 18XBRD TIE 12 (SUTURE) ×1 IMPLANT
SUT VIC AB 3-0 SH 8-18 (SUTURE) ×4 IMPLANT
SUT VICRYL 2 0 18  UND BR (SUTURE)
SUT VICRYL 2 0 18 UND BR (SUTURE) IMPLANT
TOWEL OR 17X26 10 PK STRL BLUE (TOWEL DISPOSABLE) ×4 IMPLANT
TRAY FOLEY CATH 14FRSI W/METER (CATHETERS) IMPLANT
TUBING ENDO SMARTCAP (MISCELLANEOUS) ×2 IMPLANT
WATER STERILE IRR 1500ML POUR (IV SOLUTION) IMPLANT
YANKAUER SUCT BULB TIP NO VENT (SUCTIONS) ×2 IMPLANT

## 2011-07-12 NOTE — ED Notes (Signed)
Patient brought from home by EMS with c/o rectal bleeding x 1 hour. Hx of GI bleed. Patient recently discharged 1-2 weeks ago with a bladder infection. Patient denies lightheadedness; dizziness; pain at this time.

## 2011-07-12 NOTE — Anesthesia Preprocedure Evaluation (Addendum)
Anesthesia Evaluation  Patient identified by MRN, date of birth, ID band Patient awake and Patient confused    Reviewed: Allergy & Precautions, H&P , NPO status , [name] Chart, lab work & pertinent test results  Airway Mallampati: II TM Distance: >3 FB Neck ROM: Full    Dental No notable dental hx. (+) Poor Dentition   Pulmonary neg pulmonary ROS, COPD clear to auscultation  Pulmonary exam normal       Cardiovascular hypertension, Pt. on medications + Past MI and neg cardio ROS + dysrhythmias (LBBB) Regular Normal    Neuro/Psych CVA, Residual Symptoms Negative Neurological ROS  Negative Psych ROS   GI/Hepatic negative GI ROS, Neg liver ROS,   Endo/Other  Negative Endocrine ROSDiabetes mellitus-  Renal/GU negative Renal ROS  Genitourinary negative   Musculoskeletal negative musculoskeletal ROS (+)   Abdominal   Peds negative pediatric ROS (+)  Hematology negative hematology ROS (+)   Anesthesia Other Findings   Reproductive/Obstetrics negative OB ROS                           Anesthesia Physical Anesthesia Plan  ASA: III and Emergent  Anesthesia Plan: General   Post-op Pain Management:    Induction: Intravenous, Cricoid pressure planned and Rapid sequence  Airway Management Planned: Oral ETT  Additional Equipment:   Intra-op Plan:   Post-operative Plan: Extubation in OR  Informed Consent: I have reviewed the patients History and Physical, chart, labs and discussed the procedure including the risks, benefits and alternatives for the proposed anesthesia with the patient or authorized representative who has indicated his/her understanding and acceptance.   Dental advisory given  Plan Discussed with: CRNA  Anesthesia Plan Comments: (Surgeon requests [facility].)        Anesthesia Quick Evaluation

## 2011-07-12 NOTE — Transfer of Care (Signed)
Immediate Anesthesia Transfer of Care Note  Patient: [name]  Procedure(s) Performed: Procedure(s) (LRB): EXPLORATORY LAPAROTOMY (N/A)  Patient Location: PACU  Anesthesia Type: General  Level of Consciousness: awake, alert  and oriented  Airway & Oxygen Therapy: Patient Spontanous Breathing and Patient connected to face mask oxygen  Post-op Assessment: Report given to PACU RN and Post -op Vital signs reviewed and stable  Post vital signs: Reviewed and stable  Complications: No apparent anesthesia complications

## 2011-07-12 NOTE — Anesthesia Postprocedure Evaluation (Signed)
  Anesthesia Post-op Note  Patient: [name]  Procedure(s) Performed: Procedure(s) (LRB): EXPLORATORY LAPAROTOMY (N/A)  Patient Location: PACU  Anesthesia Type: General  Level of Consciousness: awake and alert   Airway and Oxygen Therapy: Patient Spontanous Breathing  Post-op Pain: mild  Post-op Assessment: Post-op Vital signs reviewed, [name] Cardiovascular Status Stable, Respiratory Function Stable, Patent Airway and No signs of Nausea or vomiting  Post-op Vital Signs: stable  Complications: No apparent anesthesia complications

## 2011-07-12 NOTE — ED Provider Notes (Signed)
History     CSN: [id number]  Arrival date & time 07/12/11  0545   First MD Initiated Contact with Patient 07/12/11 [phone]      Chief Complaint  Patient presents with  . Rectal Bleeding    (Consider location/radiation/quality/duration/timing/severity/associated sxs/prior treatment) HPI  H/o IDDM, HTN, CHF presents with rectal bleeding. The patient's family states that approximately one to 2 hours prior to arrival there called into the room by the patient for rectal fullness. They state that she had a brown bowel movement with bright red blood mixed in. A few minutes later she had a large bright red bloody bowel movement. They report a remote history of GI bleeding secondary to diverticulosis approximately 25 years ago. No recent colonoscopy that they know of. The patient is at her baseline mental status. She does not complaining of anything at this time. She denies abdominal pain, nausea, vomiting. She is not having chest pain. Denies hematuria/dysuria/freq/urgency. Denies dizziness. Denies weakness. ASA daily, otherwise no anticoagulants.  ED Notes, ED Provider Notes from 07/12/11 0000 to 07/12/11 05:58:35       Amy Posey Boyer 07/12/2011 05:50      Patient brought from home by EMS with c/o rectal bleeding x 1 hour. Hx of GI bleed. Patient recently discharged 1-2 weeks ago with a bladder infection. Patient denies lightheadedness; dizziness; pain at this time.     Past Medical History  Diagnosis Date  . Diabetes mellitus   . Hypertension   . Vascular disease   . Shortness of breath   . Bladder infection     Past Surgical History  Procedure Date  . Bladder tack   . Femoral artery stent     History reviewed. No pertinent family history.  History  Substance Use Topics  . Smoking status: Never Smoker   . Smokeless tobacco: Not on file  . Alcohol Use: No    OB History    Grav Para Term Preterm Abortions TAB SAB Ect Mult Living                  Review of Systems  All other  systems reviewed and are negative.   except as noted HPI   Allergies  Avelox; Coumadin; and Proton pump inhibitors  Home Medications   Current Outpatient Rx  Name Route Sig Dispense Refill  . IPRATROPIUM-ALBUTEROL 18-103 MCG/ACT IN AERO Inhalation Inhale 2 puffs into the lungs every 6 (six) hours as needed. For shortness of breath    . ALLOPURINOL 100 MG PO TABS Oral Take 100 mg by mouth at bedtime.    . ALPRAZOLAM 0.25 MG PO TABS Oral Take 0.25 mg by mouth at bedtime.    Marland Kitchen VITAMIN C 100 MG PO TABS Oral Take 100 mg by mouth daily.    . ASPIRIN EC 81 MG PO TBEC Oral Take 81 mg by mouth every evening.    . ATORVASTATIN CALCIUM 20 MG PO TABS Oral Take 20 mg by mouth at bedtime.    . B COMPLEX-C PO TABS Oral Take 1 tablet by mouth daily.    Marland Kitchen CALCIUM-MAGNESIUM-ZINC 1000-400-15 MG PO TABS Oral Take 1 tablet by mouth daily.    . CHLORPHEN-PSEUDOEPHED-APAP 2-30-325 MG PO TABS Oral Take 1 tablet by mouth daily as needed. For cough and chest congestion    . CINNAMON 500 MG PO CAPS Oral Take 1,000 mg by mouth daily.    Marland Kitchen DIGOXIN 0.125 MG PO TABS Oral Take 125 mcg by mouth every other day.    Marland Kitchen  FAMOTIDINE 20 MG PO TABS Oral Take 20 mg by mouth at bedtime.    . FUROSEMIDE 40 MG PO TABS Oral Take 40 mg by mouth 2 (two) times daily.    . INSULIN ISOPHANE & REGULAR (50-50) 100 UNIT/ML [employer] SUSP Subcutaneous Inject 55-80 Units into the skin 2 (two) times daily before a meal. 80 units in the am and 55 units in the pm    . GERITOL COMPLETE PO Oral Take 2 tablets by mouth daily.    . ISOSORBIDE MONONITRATE ER 30 MG PO TB24 Oral Take 15 mg by mouth daily.    Marland Kitchen METOPROLOL TARTRATE 25 MG PO TABS Oral Take 12.5 mg by mouth 2 (two) times daily.    Marland Kitchen MINERAL OIL PO OIL Oral Take 5 mLs by mouth daily as needed. For constipation    . MOMETASONE FUROATE 50 MCG/ACT NA SUSP Nasal Place 2 sprays into the nose at bedtime as needed. For congestion    . ICAPS PO CAPS Oral Take 1 capsule by mouth 2 (two) times daily.      . OMEGA-3-ACID ETHYL ESTERS 1 G PO CAPS Oral Take 2 g by mouth daily.    Marland Kitchen POTASSIUM CHLORIDE ER 10 MEQ PO TBCR Oral Take 10 mEq by mouth daily.    Marland Kitchen PHENAZOPYRIDINE HCL 200 MG PO TABS Oral Take 200 mg by mouth every 12 (twelve) hours.    Marland Kitchen SALINE NASAL SPRAY 0.65 % NA SOLN Nasal Place 1 spray into the nose at bedtime as needed. For congestion      BP 125/53  Pulse 75  Temp(Src) 96.5 F (35.8 C) (Axillary)  Resp 20  SpO2 100%  Physical Exam  Nursing note and vitals reviewed. Constitutional: She is oriented to person, place, and time. She appears well-developed.  HENT:  Head: Atraumatic.  Mouth/Throat: Oropharynx is clear and moist.  Eyes: Conjunctivae and EOM are normal. Pupils are equal, round, and reactive to light.  Neck: Normal range of motion. Neck supple.  Cardiovascular: Normal rate, regular rhythm, normal heart sounds and intact distal pulses.   Pulmonary/Chest: Effort normal and breath sounds normal. No respiratory distress. She has no wheezes. She has no rales.  Abdominal: Soft. She exhibits no distension. There is no tenderness. There is no rebound and no guarding.  Genitourinary:       Bright red blood per rectum pooling in bed  Musculoskeletal: Normal range of motion.  Neurological: She is alert and oriented to person, place, and time.  Skin: Skin is warm and dry. No rash noted.  Psychiatric: She has a normal mood and affect.    Date: 07/12/2011  Rate: 83  Rhythm: normal sinus rhythm  QRS Axis: normal  Intervals: normal  ST/T Wave abnormalities: nonspecific ST changes  Conduction Disutrbances:left bundle branch block  Narrative Interpretation:   Old EKG Reviewed: unchanged   ED Course  Procedures (including critical care time)  Labs Reviewed  CBC - Abnormal; Notable for the following:    RDW 24.0 (*)    Platelets 85 (*)    All other components within normal limits  COMPREHENSIVE METABOLIC PANEL - Abnormal; Notable for the following:    Potassium 5.6  (*)    Glucose, Bld 247 (*)    BUN 65 (*)    Creatinine, Ser 1.67 (*)    Albumin 2.8 (*)    AST 52 (*) NO VISIBLE HEMOLYSIS   ALT 163 (*)    Alkaline Phosphatase 204 (*)    Total Bilirubin 1.5 (*)  GFR calc non Af Amer 27 (*)    GFR calc Af Amer 32 (*)    All other components within normal limits  PROTIME-INR - Abnormal; Notable for the following:    Prothrombin Time 16.0 (*)    All other components within normal limits  DIFFERENTIAL  LACTIC ACID, PLASMA  TROPONIN I  APTT  TYPE AND SCREEN  PREPARE RBC (CROSSMATCH)   No results found.   1. GI bleed   2. Transaminitis     MDM  She presents with GI bleeding. She has bright red blood per rectum. She has no abdominal pain including epigastric pain and has had no emesis. This is likely a lower GI bleed. She has and "allergy" to PPIs.  She is bleeding quite briskly at this time her vitals are stable. Nurses aware two large bore IVs needed. She does have a history of congestive heart failure. We'll bolus IV fluid in increments. Labs including type and cross. She will likely need a transfusion. Plans for admission to step down versus ICU.   BP 125/53  Pulse 75  Temp(Src) 96.5 F (35.8 C) (Axillary)  Resp 20  SpO2 100%  Nursing staff did have some difficulty obtaining labs. Will repeat potassium. Transaminitis noted, unclear cause. No abd pain/ttp.   0800 Discussed with GI who will see her as inpatient.  Discussed admission with [facility] hospitalist  Forbes Cellar, MD 07/21/11 1504

## 2011-07-12 NOTE — Progress Notes (Signed)
Re:   [name] DOB:   1926-05-01 MRN:   [id number]  ASSESSMENT AND PLAN: 1.  Lower GI bleed, ? Source.  Discussed with Dr. Deno Etienne and P. Hung - Dr. Elnoria Howard is going to do an upper endo and try to look in her colon.   Would consider nuclear scan to better identify source.  If patient continues to bleed and specific location can not be identified, she will need subtotal colectomy if source is not found.  Discussed with family, Steward Drone and another daughter.  The risks of major abdominal surgery in this patient is significant.  Risks include continued bleeding, infection, leak from the bowel, and long term consequences of colon resection.  The sons sound like they are not very involved in her life.  2.  Thrombocytopenia - 85,000 - 07/12/2011  ? Reason.  Note: patient has been on aspirin. 3.  Diabetes Mellitus. 4.  CKD 5.  LBBB. 6.  Chronic systolic heart failure - Creat. 1.67 - 07/12/2011 7.  Just discharged 06/30/2011 with diagnosis of acute on chronic respiratory failure.  Though daughter disputes this and said that she was treated for a UTI. 8.  Chronic systolic CHF -  EF 37% 9.  Nutrition - Alb - 2.8. 10.  History of vena caval filter.  Chief Complaint  Patient presents with  . Rectal Bleeding   REFERRING PHYSICIAN: Marthann Schiller, MD  HISTORY OF PRESENT ILLNESS: [name] is a 78 y.o. (DOB: 26-Feb-1926)  AA female whose primary care physician is Laurena Slimmer, MD, MD.  She was admitted to the [facility] ICU with lower GI bleed today.  She is in the ICU for a lower GI bleed.  She has never had a lower GI bleed before.  She apparently has know diverticula.   No history of stomach disease.  No history of liver disease.  No history of gall bladder disease.  No history of pancreas disease.  No history of colon disease.  Patient has had a prior GI bleed on coumadin around 4 years ago.   Past Medical History  Diagnosis Date  . Diabetes mellitus   . Hypertension   . Vascular  disease   . Shortness of breath   . Bladder infection       Past Surgical History  Procedure Date  . Bladder tack   . Femoral artery stent       Current Facility-Administered Medications  Medication Dose Route Frequency Provider Last Rate Last Dose  . digoxin ([name]) injection 0.125 mg  0.125 mg Intravenous Daily Michelle A. Ashley Royalty, MD   0.125 mg at 07/12/11 1148  . fluticasone (FLONASE) 50 MCG/ACT nasal spray 2 spray  2 spray Each Nare QHS Michelle A. Ashley Royalty, MD      . furosemide (LASIX) injection 20 mg  20 mg Intravenous BID Michelle A. Ashley Royalty, MD      . furosemide (LASIX) injection 20 mg  20 mg Intravenous Once Michelle A. Ashley Royalty, MD      . insulin aspart (novoLOG) injection 0-15 Units  0-15 Units Subcutaneous TID WC Michelle A. Ashley Royalty, MD   3 Units at 07/12/11 1149  . metoprolol (LOPRESSOR) injection 5 mg  5 mg Intravenous Q8H Michelle A. Ashley Royalty, MD      . morphine 2 MG/ML injection 2 mg  2 mg Intravenous Q4H PRN Michelle A. Ashley Royalty, MD      . sodium chloride (OCEAN) 0.65 % nasal spray 1 spray  1 spray Nasal QHS PRN Marcelino Duster  Neita Garnet, MD      . sodium chloride 0.9 % bolus 1,000 mL  1,000 mL Intravenous Once Forbes Cellar, MD   1,000 mL at 07/12/11 0700  . sodium chloride 0.9 % bolus 500 mL  500 mL Intravenous Once Forbes Cellar, MD   500 mL at 07/12/11 4098  . sodium chloride 0.9 % injection 3 mL  3 mL Intravenous Q12H Michelle A. Ashley Royalty, MD      . DISCONTD: 0.9 %  sodium chloride infusion   Intravenous Once Forbes Cellar, MD      . DISCONTD: 0.9 %  sodium chloride infusion   Intravenous Once Forbes Cellar, MD      . DISCONTD: sodium chloride (OCEAN) 0.65 % nasal spray 1 spray  1 spray Nasal QHS PRN Michelle A. Ashley Royalty, MD          Allergies  Allergen Reactions  . Avelox (Moxifloxacin Hcl In Nacl)   . Coumadin Other (See Comments)    Causes severe bleeding  . Proton Pump Inhibitors Other (See Comments)    Thrombocytopenia    REVIEW OF  SYSTEMS: Skin:  No history of rash.  No history of abnormal moles. Infection:  No history of hepatitis or HIV.  No history of MRSA. Neurologic:  No history of stroke.  No history of seizure.  No history of headaches. Cardiac: History of CHF.  Has seen Dr. Vickey Sages from cardiology. Pulmonary:  On home O2.  Patient was recently hospitalized from 06/20/2011 - 06/30/2011 for acute on chronic respiratory failure.  Endocrine:  Diabetes mellitus. No thyroid disease. Gastrointestinal:  See HPI. Urologic:  History of bladder tack. Musculoskeletal:  No history of joint or back disease. Hematologic:  Thrombocytopenia, cause unknown.  Patient has vena caval filter. Psycho-social:  The patient is oriented.    SOCIAL and FAMILY HISTORY: Widowed.  Has 5 children.  Lives with child, Steward Drone. Steward Drone has the power of attorney.  PHYSICAL EXAM: BP 134/57  Pulse 79  Temp(Src) 97.4 F (36.3 C) (Oral)  Resp 18  Ht 5\' 3"  (1.6 m)  Wt 141 lb 12.1 oz (64.3 kg)  BMI 25.11 kg/m2  SpO2 100%  General: Old WN AA F who is alert.  But does not look healthly.  She has a soft voice.  But can tell me how she is doing.  HEENT: Normal. Pupils equal. Neck: Supple. No mass.  No thyroid mass.  Carotid pulse okay with no bruit. Lymph Nodes:  No supraclavicular or cervical nodes. Lungs: Clear to auscultation and symmetric breath sounds. Heart:  RRR. 3/6 systolic murmur.  Abdomen: Soft. No mass. No tenderness. No hernia. Normal bowel sounds.  No abdominal scars. Rectal: Bright red blood from rectum. Extremities:  Moves extremities, but has not walked since last hospitalization. Neurologic:  Grossly intact to motor and sensory function. Psychiatric: Patient responds, but in soft voice.  DATA REVIEWED: Labs and chart.  Ovidio Kin, MD,  [facility] [address] [place]  [address]   [place], [place]    [address] Phone:  [phone] FAX:  [phone]

## 2011-07-12 NOTE — Op Note (Signed)
[facility] [address] [place], [place]  [address]  FLEXIBLE SIGMOIDOSCOPY PROCEDURE REPORT  PATIENT:  [name], [name]  MR#:  [id number] BIRTHDATE:  1925/10/10, 85 yrs. old  GENDER:  female ENDOSCOPIST:  Jeani Hawking, MD PROCEDURE DATE:  07/12/2011 PROCEDURE:  Flexible Sigmoidoscopy, diagnostic ASA CLASS:  Class IV INDICATIONS:  Hematochezia MEDICATIONS:  None  DESCRIPTION OF PROCEDURE:   After the risks benefits and alternatives of the procedure were thoroughly explained, informed consent was obtained.  No rectal exam performed. The  endoscope was introduced through the anus and advanced to the sigmoid colon, without limitations.  The quality of the prep was Unprepped.  The instrument was then slowly withdrawn as the mucosa was fully examined. <<PROCEDUREIMAGES>>  FINDINGS:   Copious amount of blood was noted as well as sigmoid diverticula. Retroflexed views in the rectum revealed medium hemorrhoids.    The scope was then withdrawn from the patient and the procedure terminated.  COMPLICATIONS:  None  ENDOSCOPIC IMPRESSION: 1) Medium hemorrhoids RECOMMENDATIONS:  ______________________________ Jeani Hawking, MD  n. Rosalie DoctorJeani Hawking at 07/12/2011 01:53 PM  Kevin Fenton, [phone]

## 2011-07-12 NOTE — Consult Note (Signed)
Reason for Consult:  preop clearance for possible partial colectomy Referring Physician: [facility] hospitalist  [name] is an 78 y.o. female.  HPI: Patient is 78 year old female with past medical history significant for multiple medical problems are he coronary artery disease history of MI in the past history of congestive heart failure ischemic cardiomyopathy EF of 35-40% hypertension diabetes mellitus COPD history of CVA history of bilateral pulmonary embolism in the past status post IVC filter valvular heart disease remote history of tobacco abuse chronic kidney disease stage III history of lower GI diverticular bleed in the past dementia was admitted last night because of recurrent bright red bleeding per rectum from last night. Patient then underwent the upper endoscopy and sigmoidoscopy earlier today which was unrevealing to localize the site of the bleeding. Patient continues to have bright red blood or her rectum. Cardiologic consultation is called for surgical clearance for possible emergency laparotomy and possible partial colectomy. The patient presently alert awake denies any chest pain shortness of breath or dizziness. Denies any history of PND orthopnea or leg swelling. Patient denies any exertional chest pain although her activity is very limited. Denies any recent cardiac workup but had echo last month which showed EF of 35-40% with moderate mitral and pulmonic regurgitation and severe tricuspid regurgitation. Patient denies any NSAID abuse states had the GI bleeding approximately 3 years ago while she was taking Coumadin which was felt to be diverticular bleed.  Past Medical History  Diagnosis Date  . Diabetes mellitus   . Hypertension   . Vascular disease   . Shortness of breath   . Bladder infection   . COPD (chronic obstructive pulmonary disease)   . Myocardial infarction   . Stroke   . GERD (gastroesophageal reflux disease)     Past Surgical History  Procedure Date  .  Bladder tack   . Femoral artery stent   . Abdominal hysterectomy     History reviewed. No pertinent family history.  Social History:  reports that she has never smoked. She does not have any smokeless tobacco history on file. She reports that she does not drink alcohol or use illicit drugs.  Allergies:  Allergies  Allergen Reactions  . Avelox (Moxifloxacin Hcl In Nacl)   . Coumadin Other (See Comments)    Causes severe bleeding  . Proton Pump Inhibitors Other (See Comments)    Thrombocytopenia    Medications: I have reviewed the patient's current medications.  Results for orders placed during the hospital encounter of 07/12/11 (from the past 48 hour(s))  CBC     Status: Abnormal   Collection Time   07/12/11  6:20 AM      Component Value Range Comment   WBC 8.2  4.0 - 10.5 (K/uL)    RBC 4.16  3.87 - 5.11 (MIL/uL)    Hemoglobin 12.1  12.0 - 15.0 (g/dL)    HCT 16.1  09.6 - 04.5 (%)    MCV 92.8  78.0 - 100.0 (fL)    MCH 29.1  26.0 - 34.0 (pg)    MCHC 31.3  30.0 - 36.0 (g/dL)    RDW 40.9 (*) 81.1 - 15.5 (%)    Platelets 85 (*) 150 - 400 (K/uL)   DIFFERENTIAL     Status: Normal   Collection Time   07/12/11  6:20 AM      Component Value Range Comment   Neutrophils Relative 69  43 - 77 (%)    Lymphocytes Relative 19  12 - 46 (%)  Monocytes Relative 9  3 - 12 (%)    Eosinophils Relative 2  0 - 5 (%)    Basophils Relative 1  0 - 1 (%)    Neutro Abs 5.6  1.7 - 7.7 (K/uL)    Lymphs Abs 1.6  0.7 - 4.0 (K/uL)    Monocytes Absolute 0.7  0.1 - 1.0 (K/uL)    Eosinophils Absolute 0.2  0.0 - 0.7 (K/uL)    Basophils Absolute 0.1  0.0 - 0.1 (K/uL)    RBC Morphology ELLIPTOCYTES      Smear Review PLATELET COUNT CONFIRMED BY SMEAR     COMPREHENSIVE METABOLIC PANEL     Status: Abnormal   Collection Time   07/12/11  6:20 AM      Component Value Range Comment   Sodium 137  135 - 145 (mEq/L)    Potassium 5.6 (*) 3.5 - 5.1 (mEq/L)    Chloride 101  96 - 112 (mEq/L)    CO2 27  19 - 32  (mEq/L)    Glucose, Bld 247 (*) 70 - 99 (mg/dL)    BUN 65 (*) 6 - 23 (mg/dL)    Creatinine, Ser 6.04 (*) 0.50 - 1.10 (mg/dL)    Calcium 8.8  8.4 - 10.5 (mg/dL)    Total Protein 6.8  6.0 - 8.3 (g/dL)    Albumin 2.8 (*) 3.5 - 5.2 (g/dL)    AST 52 (*) 0 - 37 (U/L) NO VISIBLE HEMOLYSIS   ALT 163 (*) 0 - 35 (U/L)    Alkaline Phosphatase 204 (*) 39 - 117 (U/L)    Total Bilirubin 1.5 (*) 0.3 - 1.2 (mg/dL)    GFR calc non Af Amer 27 (*) >90 (mL/min)    GFR calc Af Amer 32 (*) >90 (mL/min)   LACTIC ACID, PLASMA     Status: Normal   Collection Time   07/12/11  6:20 AM      Component Value Range Comment   Lactic Acid, Venous 1.9  0.5 - 2.2 (mmol/L)   TROPONIN I     Status: Normal   Collection Time   07/12/11  6:20 AM      Component Value Range Comment   Troponin I <0.30  <0.30 (ng/mL)   APTT     Status: Normal   Collection Time   07/12/11  6:20 AM      Component Value Range Comment   aPTT 31  24 - 37 (seconds)   PROTIME-INR     Status: Abnormal   Collection Time   07/12/11  6:20 AM      Component Value Range Comment   Prothrombin Time 16.0 (*) 11.6 - 15.2 (seconds)    INR 1.25  0.00 - 1.49    DIGOXIN LEVEL     Status: Abnormal   Collection Time   07/12/11  6:20 AM      Component Value Range Comment   Digoxin Level 0.5 (*) 0.8 - 2.0 (ng/mL)   RETICULOCYTES     Status: Abnormal   Collection Time   07/12/11  6:30 AM      Component Value Range Comment   Retic Ct Pct 3.2 (*) 0.4 - 3.1 (%)    RBC. 4.26  3.87 - 5.11 (MIL/uL)    Retic Count, Manual 136.3  19.0 - 186.0 (K/uL)   TYPE AND SCREEN     Status: Normal (Preliminary result)   Collection Time   07/12/11  7:10 AM      Component Value Range Comment  ABO/RH(D) A POS      Antibody Screen NEG      Sample Expiration 07/15/2011      Unit Number [id number]      Blood Component Type RED CELLS,LR      Unit division 00      Status of Unit ISSUED      Transfusion Status OK TO TRANSFUSE      Crossmatch Result Compatible      Unit Number  [id number]      Blood Component Type RED CELLS,LR      Unit division 00      Status of Unit ISSUED      Transfusion Status OK TO TRANSFUSE      Crossmatch Result Compatible      Unit Number [id number]      Blood Component Type RED CELLS,LR      Unit division 00      Status of Unit ALLOCATED      Transfusion Status OK TO TRANSFUSE      Crossmatch Result Compatible      Unit Number [id number]      Blood Component Type RED CELLS,LR      Unit division 00      Status of Unit ISSUED      Transfusion Status OK TO TRANSFUSE      Crossmatch Result Compatible      Unit Number [id number]      Blood Component Type RED CELLS,LR      Unit division 00      Status of Unit ALLOCATED      Transfusion Status OK TO TRANSFUSE      Crossmatch Result Compatible      Unit Number [id number]      Blood Component Type RED CELLS,LR      Unit division 00      Status of Unit ALLOCATED      Transfusion Status OK TO TRANSFUSE      Crossmatch Result Compatible     POTASSIUM     Status: Abnormal   Collection Time   07/12/11  8:25 AM      Component Value Range Comment   Potassium 5.4 (*) 3.5 - 5.1 (mEq/L)   PREPARE RBC (CROSSMATCH)     Status: Normal   Collection Time   07/12/11  8:30 AM      Component Value Range Comment   Order Confirmation ORDER PROCESSED BY BLOOD BANK     MRSA PCR SCREENING     Status: Normal   Collection Time   07/12/11 10:50 AM      Component Value Range Comment   MRSA by PCR NEGATIVE  NEGATIVE    GLUCOSE, CAPILLARY     Status: Abnormal   Collection Time   07/12/11 11:49 AM      Component Value Range Comment   Glucose-Capillary 240 (*) 70 - 99 (mg/dL)    Comment 1 Documented in Chart      Comment 2 Notify RN     PREPARE PLATELET PHERESIS     Status: Normal (Preliminary result)   Collection Time   07/12/11 12:00 PM      Component Value Range Comment   Unit Number [id number]      Blood Component Type PLTPHER LR3      Unit division 00      Status of Unit ISSUED      Transfusion  Status OK TO TRANSFUSE       No results found.  Review of Systems  Constitutional: Negative for fever, chills and weight loss.  Respiratory: Negative for cough, hemoptysis and sputum production.   Cardiovascular: Negative for chest pain, palpitations, orthopnea and claudication.  Gastrointestinal: Positive for blood in stool. Negative for nausea, vomiting, abdominal pain and diarrhea.  Neurological: Negative for headaches.   Blood pressure 134/52, pulse 74, temperature 97.9 F (36.6 C), temperature source Axillary, resp. rate 20, height 5\' 3"  (1.6 m), weight 64.3 kg (141 lb 12.1 oz), SpO2 100.00%. Physical Exam  Constitutional: She is oriented to person, place, and time. No distress.  HENT:  Head: Normocephalic.  Eyes:       Conjunctiva pale sclera nonicteric  Neck: Neck supple. No JVD present. No tracheal deviation present. No thyromegaly present.  Cardiovascular: Normal rate, regular rhythm and normal heart sounds.        Soft systolic murmur noted no S3 gallop  Respiratory: Breath sounds normal. No respiratory distress. She has no wheezes. She has no rales.  GI: Soft. Bowel sounds are normal. She exhibits no distension. There is no tenderness. There is no rebound.  Musculoskeletal: She exhibits no edema.  Lymphadenopathy:    She has no cervical adenopathy.  Neurological: She is alert and oriented to person, place, and time.    Assessment/Plan: Acute recurrent lower GI bleed probable diverticular bleed Coronary artery disease history of MI in the past Valvular heart disease Compensated systolic heart failure Hypertension Diabetes mellitus History of CVA History of bilateral PE History of DVT in the past COPD History of lower GI bleed in the past Chronic kidney disease History of remote tobacco abuse Thrombocytopenia Plan Agree with packed RBCs transfusion keep hematocrit above 30 Discuss with family regarding emergency high-risk surgery family understands the risk  and benefits and want to proceed with the surgery if warranted Patient is scheduled for bleeding scan to localize the site of the bleeding. Patient remains hemodynamically stable at this point her receiving fourth unit of packed RBC will check serial H&H.   [name] 07/12/2011, 3:23 PM

## 2011-07-12 NOTE — Brief Op Note (Signed)
07/12/2011  8:42 PM  PATIENT:  [name], 78 y.o., female, MRN: [id number]  PREOP DIAGNOSIS:  Gastrintestional bleed, junction of left and sigmoid colon  POSTOP DIAGNOSIS:   Lower GI bleed, junction of left and sigmoid colon  PROCEDURE:   Procedure(s): Extended left/sigmoid colectomy, mobilization of splenic flexure, Hartmann's pouch, end left transverse colon ostomy.  SURGEON:   Ovidio Kin, M.D.  ASSISTANTBiagio Quint, M.D.  ANESTHESIA:   general  Phillips Grout, MD - Anesthesiologist Zebedee Iba, CRNA - CRNA Peggy Williford - CRNA  General  EBL:  100  ml  BLOOD ADMINISTERED: none  DRAINS: none   LOCAL MEDICATIONS USED:   none  SPECIMEN:   Left colon/sigmoid colon  COUNTS CORRECT:  YES  INDICATIONS FOR PROCEDURE:  [name] is a 78 y.o. (DOB: 1925-12-29) AA female whose primary care physician is Laurena Slimmer, MD, MD and comes for exploration/colectomy for lower GI bleed.   The indications and risks of the surgery were explained to the patient.  The risks include, but are not limited to, infection, bleeding, and nerve injury.  Note dictated to:   #[id number]

## 2011-07-12 NOTE — H&P (Signed)
Hospital Admission Note Date: 07/12/2011  Patient name: [name] Medical record number: [id number] Date of birth: [birth year] Age: 78 y.o. Gender: female PCP: Laurena Slimmer, MD, MD  Attending physician: Lawrence Marseilles Ashley Royalty, MD  Chief Complaint:Rectal Bleeding since 3:00am.  History of Present Illness:Pt is a pleasant and relatively active 78 y/o who went to the bathroom this morning and noticed BBBPR. Daughter reports 2 additional episodes before arriving in ED. Since being in th e ED the patient has had several episodes and is continuing to bleed. She denies any abdominal pain, Chest pain, or dizziness. Historically, Pt has a H/O DVT with IVC filter placement. He had a GI bleed in 2011 while on Coumadin,Chronic systolic CHF, CKD III,  and has a H/O thrombocytopenia of unclear etiology. The patient has an allergy to Protonix which causes thrombocytopenia.   Scheduled Meds:   . sodium chloride   Intravenous Once  . sodium chloride  1,000 mL Intravenous Once  . sodium chloride  500 mL Intravenous Once  . DISCONTD: sodium chloride   Intravenous Once   Continuous Infusions:  PRN Meds:. Allergies: Avelox; Coumadin; and Proton pump inhibitors Past Medical History  Diagnosis Date  . Diabetes mellitus   . Hypertension   . Vascular disease   . Shortness of breath   . Bladder infection    Past Surgical History  Procedure Date  . Bladder tack   . Femoral artery stent    History reviewed. No pertinent family history. History   Social History  . Marital Status: Widowed    Spouse Name: N/A    Number of Children: N/A  . Years of Education: N/A   Occupational History  . Not on file.   Social History Main Topics  . Smoking status: Never Smoker   . Smokeless tobacco: Not on file  . Alcohol Use: No  . Drug Use: No  . Sexually Active: No   Other Topics Concern  . Not on file   Social History Narrative  . No narrative on file   Review of Systems: A comprehensive  review of systems was negative. Physical Exam:  Intake/Output Summary (Last 24 hours) at 07/12/11 1017 Last data filed at 07/12/11 0950  Gross per 24 hour  Intake 1012.5 ml  Output      0 ml  Net 1012.5 ml   General: Alert, awake, oriented x3, in no acute distress.  HEENT: [place]/AT PEERL, EOMI Neck: Trachea midline,  no masses, no thyromegal,y no JVD, no carotid bruit OROPHARYNX:  Moist, No exudate/ erythema/lesions.  Heart: Regular rate and rhythm, without murmurs, rubs, gallops Lungs: Clear to auscultation, no wheezing or rhonchi noted. No increased vocal fremitus resonant to percussion  Abdomen: Soft, nontender, nondistended, positive bowel sounds, no masses no hepatosplenomegaly noted..  Neuro: No focal neurological deficits noted cranial nerves II through XII grossly intact. Pt has residual weakness on Rt. From a previous stroke. Musculoskeletal: No warm swelling or erythema around joints, no spinal tenderness noted. Psychiatric: Patient alert and oriented x3  Lab results:  Basename 07/12/11 0825 07/12/11 0620  NA -- 137  K 5.4* 5.6*  CL -- 101  CO2 -- 27  GLUCOSE -- 247*  BUN -- 65*  CREATININE -- 1.67*  CALCIUM -- 8.8  MG -- --  PHOS -- --    Basename 07/12/11 0620  AST 52*  ALT 163*  ALKPHOS 204*  BILITOT 1.5*  PROT 6.8  ALBUMIN 2.8*   No results found for this basename: LIPASE:2,AMYLASE:2 in  the last 72 hours  Basename 07/12/11 0620  WBC 8.2  NEUTROABS 5.6  HGB 12.1  HCT 38.6  MCV 92.8  PLT 85*    Basename 07/12/11 0620  CKTOTAL --  CKMB --  CKMBINDEX --  TROPONINI <0.30   No components found with this basename: POCBNP:3 No results found for this basename: DDIMER:2 in the last 72 hours No results found for this basename: HGBA1C:2 in the last 72 hours No results found for this basename: CHOL:2,HDL:2,LDLCALC:2,TRIG:2,CHOLHDL:2,LDLDIRECT:2 in the last 72 hours No results found for this basename: TSH,T4TOTAL,FREET3,T3FREE,THYROIDAB in the last 72  hours No results found for this basename: VITAMINB12:2,FOLATE:2,FERRITIN:2,TIBC:2,IRON:2,RETICCTPCT:2 in the last 72 hours Imaging results:  Dg Chest 2 View  06/29/2011  *RADIOLOGY REPORT*  Clinical Data: Follow up pneumonia  CHEST - 2 VIEW  Comparison: 06/28/2011  Findings: Heart size is normal.  The lung volumes are low.  Diffuse bilateral lung opacities have increased from previous exam.  There is worsening aeration to the entire right lung.  IMPRESSION:  1.  Interval progression of  pulmonary edema and/or multifocal pneumonia.  Original Report Authenticated By: Rosealee Albee, M.D.   Dg Lumbar Spine Complete  07/07/2011  *RADIOLOGY REPORT*  Clinical Data: Pain after fall  LUMBAR SPINE - COMPLETE 4+ VIEW  Comparison:July 09, 2010  Findings:  An IVC filter is stable in position at the L2 level. Significant disc space narrowing at L4-L5 with anterior osteophytosis remains present and unchanged.  There is also osteophytosis and disc space narrowing at the lower thoracic spine, unchanged.  Facet joint sclerosis from L3-S1 also remains present. No pars interarticularis defects are identified.  There is mild left convex lumbar scoliosis.  The lateral alignment is within normal limits. The vertebral body heights are maintained.  IMPRESSION: No significant change in appearance of degenerative disc disease at L4-L5 and facet joint sclerosis from L3-S1.  If there is clinical concern regarding herniated disc, MRI may be of help.  Original Report Authenticated By: Brandon Melnick, M.D.   Dg Pelvis 1-2 Views  07/07/2011  *RADIOLOGY REPORT*  Clinical Data: Pain after fall  PELVIS - 1-2 VIEW  Comparison: None.  Findings: There is no evidence of hip or pelvic fracture or dislocation.  The hip joint compartments are maintained and symmetric.  The femoral heads are well rounded.  IMPRESSION: There is no evidence of acute abnormality.  Original Report Authenticated By: Brandon Melnick, M.D.   Ct Abdomen Pelvis W  Contrast  06/20/2011  *RADIOLOGY REPORT*  Clinical Data: Lower abdominal and pelvic pain and pressure. Leukocytosis.  CT ABDOMEN AND PELVIS WITH CONTRAST  Technique:  Multidetector CT imaging of the abdomen and pelvis was performed following the standard protocol during bolus administration of intravenous contrast.  Contrast: OMNIPAQUE IOHEXOL 300 MG/ML IV SOLN  Comparison: 08/13/2004  Findings: Increased cardiomegaly is seen since previous study. Diffuse bibasilar air space opacity is seen as well as a tiny right pleural effusion.  These findings are consistent with congestive heart failure.  The liver, gallbladder, pancreas, spleen, and adrenal glands are normal in appearance.  IVC filter is seen in place.  Renal cysts and parenchymal scarring again seen bilaterally.  Right extrarenal pelvis noted, without evidence of caliectasis or ureterectasis. Mild diffuse bladder wall thickening and mucosal enhancement is again noted, which may be seen with neurogenic bladder, cystitis, or chronic bladder outlet obstruction.  Previous hysterectomy noted.  Severe diverticulosis seen involving the descending and sigmoid colon, however there is no evidence of diverticulitis.  No other inflammatory process or abnormal fluid collections are identified. No soft tissue masses or lymphadenopathy identified within the abdomen or pelvis.  IMPRESSION:  1.  Increased cardiomegaly, bibasilar airspace disease, and tiny right pleural effusion, consistent with congestive heart failure. 2.  No acute findings within the abdomen or pelvis. 3.  Mild diffuse bladder wall thickening and enhancement. Differential diagnosis includes neurogenic bladder, cystitis, or chronic bladder outlet obstruction. 3.  Severe sigmoid diverticulosis.  No radiographic evidence of diverticulitis.  Original Report Authenticated By: Danae Orleans, M.D.   US Renal  06/22/2011  *RADIOLOGY REPORT*  Clinical Data: Acute renal failure  RENAL/URINARY TRACT ULTRASOUND  COMPLETE  Comparison:  CT 06/20/2011  Findings:  Right Kidney:  11.1 cm in length.  Several small cysts but no large cyst or mass.  There is an extrarenal pelvis but no true hydronephrosis.  Left Kidney:  11.4 cm in length.  Several small cysts including 8- 0.0 x 1.1 x 1.5 cm cyst of the upper medial cortex.  No hydronephrosis or mass lesion.  Bladder:  Foley catheter in place.  IMPRESSION: No evidence of renal obstruction.   A 2 cm upper pole cyst of the left kidney.  Numerous other smaller cysts bilaterally.  Extrarenal pelvis on the right  without evidence of true hydronephrosis.  Original Report Authenticated By: Thomasenia Sales, M.D.   Nm Pulmonary Per & Vent  06/20/2011  *RADIOLOGY REPORT*  Clinical Data: Congestive heart failure.  Pulmonary embolus and January 2009.  IVC filter in place.  Shortness of breath.  Elevated D-dimer.  NM PULMONARY VENTILATION AND PERFUSION SCAN  Radiopharmaceutical: 8.6 mCi xenon 133 inhaled.  4.3 mCi technetium 59m MAA IV.  Comparison: Chest x-ray dated 06/20/2011  Findings: The patient has chronic elevation of the right hemidiaphragm.  The ventilation study is otherwise normal.  The perfusion study is normal.  IMPRESSION: Normal ventilation perfusion lung scan.  No evidence of pulmonary embolism.  Original Report Authenticated By: Gwynn Burly, M.D.   Dg Chest Port 1 View  06/28/2011  *RADIOLOGY REPORT*  Clinical Data: Shortness of breath.  PORTABLE CHEST - 1 VIEW  Comparison: 06/23/2011.  Findings: The heart is enlarged but stable.  There is tortuosity and calcification of the thoracic aorta.  Low lung volumes with vascular crowding and atelectasis.  There is also moderate vascular congestion and interstitial edema.  Small bilateral pleural effusions are present.  IMPRESSION: CHF with small effusions.  Original Report Authenticated By: P. Loralie Champagne, M.D.   Dg Chest Port 1 View  06/23/2011  *RADIOLOGY REPORT*  Clinical Data: Cough.  Shortness of breath.  Congestive  heart failure.  PORTABLE CHEST - 1 VIEW  Comparison: 06/22/2011  Findings: Low lung volumes are present, causing crowding of the pulmonary vasculature.  The interstitial edema shown on the prior exam has considerably improved.  There is continued obscuration of the left hemidiaphragm.  Dense mitral annular calcification noted.  Thoracic spondylosis noted.  IMPRESSION:  1.  Pulmonary venous hypertension noted.  The interstitial edema shown on the prior exam is significantly improved. 2.  Mild cardiomegaly with mitral annular calcification. 3.  Low lung volumes. 4.  Thoracic spondylosis. 5.  Indistinct airspace opacity in the left lower lobe, potentially confluent edema, pneumonia, or atelectasis.  Original Report Authenticated By: Dellia Cloud, M.D.   Dg Chest Port 1 View  06/22/2011  *RADIOLOGY REPORT*  Clinical Data: Shortness of breath.  Question congestive heart failure.  PORTABLE CHEST - 1 VIEW  Comparison:  Plain films of the chest 12/01/2010, 07/03/2010 and 07/02/2009.  Findings: There is cardiomegaly with interstitial pulmonary edema. Hazy opacities over the chest bilaterally are compatible with layering pleural effusions.  Calcified mitral annulus is noted.  IMPRESSION: Cardiomegaly and pulmonary edema with bilateral pleural effusions.  Original Report Authenticated By: Bernadene Bell. D'ALESSIO, M.D.   Dg Abd Acute W/chest  06/20/2011  *RADIOLOGY REPORT*  Clinical Data: Left mid abdominal pain, discomfort  ACUTE ABDOMEN SERIES (ABDOMEN 2 VIEW & CHEST 1 VIEW)  Comparison: 06/10/2010.  Findings:Cardiac leads overlie the chest.  Mitral annular calcification accounts for coarse radiodensities over the cardiac silhouettes. Lung volumes are low with crowding of the bronchovascular markings.  Allowing for this, no focal pulmonary opacity is seen.  IVC filter in place.  No free air beneath the diaphragms or on the decubitus projection.  Splenic arterial calcification noted without definite calcified aneurysm.   Normal bowel gas pattern.  No acute osseous abnormality.  IMPRESSION: Normal bowel gas pattern.  Low lung volumes. If the patient's symptoms continue, consider PA and lateral chest radiographs obtained at full inspiration when the patient is clinically able.  Original Report Authenticated By: Harrel Lemon, M.D.   Other results: EKG:  LBBB.   Patient Active Hospital Problem List: GI bleed (07/12/2011)   Assessment: likely diverticular versus A-V malformation. GI consulted awaiting evaluation. However given bright red blood , I am concerned about aorto-enteric fistula.   Plan: I have consulted GI and am awaiting their recommendations. I will consider a tagged RBC scan and continue supportive care including blood transfusions. Pt allergic to Protonix will discuss   Thrombocytopenia (06/25/2011)   Assessment: Platelets over 50. Will continue to monitor and replace if fall to 50 or below unless patient continues to have continued bleeding then will transfuse 2 units plateleets    Diabetes mellitus (06/20/2011)   Assessment: SSI and check BS Q 4 hours and advance to [facility] and HS.    CKD (chronic kidney disease), stage III (06/25/2011)   Assessment: Monitor     LBBB (left bundle branch block) (07/12/2011)   Assessment: Chronic condition.   Chronic systolic heart failure (07/12/2011)   Assessment: No evidence of decompensated state. Will Check daily weights, Carefully monitor I's and O's, change digoxin to IV route, substitute IV lopressor for oral Lopressor, Monitor BP closely.    DVT prophylaxis with SCD's  Kensly Bowmer A. 07/12/2011, 10:17 AM

## 2011-07-12 NOTE — Interval H&P Note (Signed)
History and Physical Interval Note:  07/12/2011 1:06 PM  [name]  has presented today for surgery, with the diagnosis of GI bleed  The various methods of treatment have been discussed with the patient and family. After consideration of risks, benefits and other options for treatment, the patient has consented to  Procedure(s) (LRB): ESOPHAGOGASTRODUODENOSCOPY (EGD) (N/A) as a surgical intervention .  The patients' history has been reviewed, patient examined, no change in status, stable for surgery.  I have reviewed the patients' chart and labs.  Questions were answered to the patient's satisfaction.     Shironda Kain D

## 2011-07-12 NOTE — Op Note (Signed)
[facility] [address] [place], [place]  [address]  OPERATIVE PROCEDURE REPORT  PATIENT:  Bena, Kobel  MR#:  [id number] BIRTHDATE:  1925/06/13  GENDER:  female ENDOSCOPIST:  Jeani Hawking, MD ASSISTANT:  Loleta Rose. Westley Foots, RN and Rolanda Lundborg PROCEDURE DATE:  07/12/2011 PROCEDURE:  EGD, diagnostic 43235 ASA CLASS:  Class IV INDICATIONS:  GI bleed MEDICATIONS:  Fentanyl 12.5 mcg IV, Versed 1 mg IV  DESCRIPTION OF PROCEDURE:   After the risks benefits and alternatives of the procedure were thoroughly explained, informed consent was obtained.  The Gastroscope [id number] endoscope was introduced through the mouth and advanced to the second portion of the duodenum, without limitations.  The instrument was slowly withdrawn as the mucosa was fully examined. <<PROCEDUREIMAGES>>  FINDINGS: A diffuse nonbleeding gastritis was identified. Some retained food is noted in the antrum. No other abnormalities identified.    Retroflexed views revealed no abnormalities.    The scope was then withdrawn from the patient and the procedure terminated.  COMPLICATIONS:  None  IMPRESSION:  1) Gastroparesis RECOMMENDATIONS:  1) Proceed with the FFS.  ______________________________ Jeani Hawking, MD  n. Rosalie DoctorJeani Hawking at 07/12/2011 01:40 PM  Kevin Fenton, [phone]

## 2011-07-12 NOTE — Consult Note (Signed)
Consult for New Site GI  Reason for Consult: Hematochezia Referring Physician: [facility]  [name] HPI: This is an 78 year old female who presents to the ER with complaints of hematochezia.  The bleeding started this morning.  She had two painless hematochezia events at home and in the ER she had an additional two bloody bowel movements.  In the past she had a diverticular bleed and she had a colonoscopy 25 years ago.  No complaints of abdominal pain, nausea, vomiting, constipation or diarrhea.  While in the ICU she continues to have significant hematochezia.  Vital signs are stable at this time.  Past Medical History  Diagnosis Date  . Diabetes mellitus   . Hypertension   . Vascular disease   . Shortness of breath   . Bladder infection     Past Surgical History  Procedure Date  . Bladder tack   . Femoral artery stent     History reviewed. No pertinent family history.  Social History:  reports that she has never smoked. She does not have any smokeless tobacco history on file. She reports that she does not drink alcohol or use illicit drugs.  Allergies:  Allergies  Allergen Reactions  . Avelox (Moxifloxacin Hcl In Nacl)   . Coumadin Other (See Comments)    Causes severe bleeding  . Proton Pump Inhibitors     Medications:  Scheduled:   . sodium chloride   Intravenous Once  . sodium chloride   Intravenous Once  . sodium chloride  1,000 mL Intravenous Once  . sodium chloride  500 mL Intravenous Once   Continuous:   Results for orders placed during the hospital encounter of 07/12/11 (from the past 24 hour(s))  CBC     Status: Abnormal   Collection Time   07/12/11  6:20 AM      Component Value Range   WBC 8.2  4.0 - 10.5 (K/uL)   RBC 4.16  3.87 - 5.11 (MIL/uL)   Hemoglobin 12.1  12.0 - 15.0 (g/dL)   HCT 16.1  09.6 - 04.5 (%)   MCV 92.8  78.0 - 100.0 (fL)   MCH 29.1  26.0 - 34.0 (pg)   MCHC 31.3  30.0 - 36.0 (g/dL)   RDW 40.9 (*) 81.1 - 15.5 (%)   Platelets 85 (*) 150 - 400 (K/uL)  DIFFERENTIAL     Status: Normal   Collection Time   07/12/11  6:20 AM      Component Value Range   Neutrophils Relative 69  43 - 77 (%)   Lymphocytes Relative 19  12 - 46 (%)   Monocytes Relative 9  3 - 12 (%)   Eosinophils Relative 2  0 - 5 (%)   Basophils Relative 1  0 - 1 (%)   Neutro Abs 5.6  1.7 - 7.7 (K/uL)   Lymphs Abs 1.6  0.7 - 4.0 (K/uL)   Monocytes Absolute 0.7  0.1 - 1.0 (K/uL)   Eosinophils Absolute 0.2  0.0 - 0.7 (K/uL)   Basophils Absolute 0.1  0.0 - 0.1 (K/uL)   RBC Morphology ELLIPTOCYTES     Smear Review PLATELET COUNT CONFIRMED BY SMEAR    COMPREHENSIVE METABOLIC PANEL     Status: Abnormal   Collection Time   07/12/11  6:20 AM      Component Value Range   Sodium 137  135 - 145 (mEq/L)   Potassium 5.6 (*) 3.5 - 5.1 (mEq/L)   Chloride 101  96 - 112 (  mEq/L)   CO2 27  19 - 32 (mEq/L)   Glucose, Bld 247 (*) 70 - 99 (mg/dL)   BUN 65 (*) 6 - 23 (mg/dL)   Creatinine, Ser 8.11 (*) 0.50 - 1.10 (mg/dL)   Calcium 8.8  8.4 - 91.4 (mg/dL)   Total Protein 6.8  6.0 - 8.3 (g/dL)   Albumin 2.8 (*) 3.5 - 5.2 (g/dL)   AST 52 (*) 0 - 37 (U/L)   ALT 163 (*) 0 - 35 (U/L)   Alkaline Phosphatase 204 (*) 39 - 117 (U/L)   Total Bilirubin 1.5 (*) 0.3 - 1.2 (mg/dL)   GFR calc non Af Amer 27 (*) >90 (mL/min)   GFR calc Af Amer 32 (*) >90 (mL/min)  LACTIC ACID, PLASMA     Status: Normal   Collection Time   07/12/11  6:20 AM      Component Value Range   Lactic Acid, Venous 1.9  0.5 - 2.2 (mmol/L)  TROPONIN I     Status: Normal   Collection Time   07/12/11  6:20 AM      Component Value Range   Troponin I <0.30  <0.30 (ng/mL)  APTT     Status: Normal   Collection Time   07/12/11  6:20 AM      Component Value Range   aPTT 31  24 - 37 (seconds)  PROTIME-INR     Status: Abnormal   Collection Time   07/12/11  6:20 AM      Component Value Range   Prothrombin Time 16.0 (*) 11.6 - 15.2 (seconds)   INR 1.25  0.00 - 1.49   TYPE AND SCREEN     Status:  Normal (Preliminary result)   Collection Time   07/12/11  7:10 AM      Component Value Range   ABO/RH(D) A POS     Antibody Screen NEG     Sample Expiration 07/15/2011     Unit Number [id number]     Blood Component Type RED CELLS,LR     Unit division 00     Status of Unit ALLOCATED     Transfusion Status OK TO TRANSFUSE     Crossmatch Result Compatible     Unit Number [id number]     Blood Component Type RED CELLS,LR     Unit division 00     Status of Unit ALLOCATED     Transfusion Status OK TO TRANSFUSE     Crossmatch Result Compatible    POTASSIUM     Status: Abnormal   Collection Time   07/12/11  8:25 AM      Component Value Range   Potassium 5.4 (*) 3.5 - 5.1 (mEq/L)  PREPARE RBC (CROSSMATCH)     Status: Normal   Collection Time   07/12/11  8:30 AM      Component Value Range   Order Confirmation ORDER PROCESSED BY BLOOD BANK       No results found.  ROS:  As stated above in the HPI otherwise negative.  Blood pressure 109/48, pulse 70, temperature 96.5 F (35.8 C), temperature source Axillary, resp. rate 17, SpO2 100.00%.    PE: Gen: NAD, Alert and Oriented HEENT:  [place]/AT, EOMI Neck: Supple, no LAD Lungs: CTA Bilaterally CV: RRR without M/G/R ABM: Soft, NTND, +BS Ext: No C/C/E  Assessment/Plan: 1) Probable diverticular bleed   I feel that she has a diverticular bleed with her clinical presentation, however, an upper GI source needs to be ruled out.  I  will perform an emergent EGD.  I discussed the situation with the daughter.  Plan: 1) EGD now. 2) Surgical consultation.  Bethan Adamek D 07/12/2011, 9:01 AM

## 2011-07-12 NOTE — Progress Notes (Signed)
Pt's EGD and flex sig performed at bedside.  Report given to floor RN, who will continue to monitor.  Angelique Blonder, RN

## 2011-07-12 NOTE — Consult Note (Addendum)
Reason for Consult: ICU management s/p hemicolectomy for GIB Referring Physician: surgery   [name] is an 78 y.o. female.  HPI:  85yof admitted 2/2 with weakness now s/p hemicolectomy for GIB and admitted to [facility] ICU on 2/24.   2/2-2/12 admission: admitted for weakness x2days, vaginal itching x2 weeks, incontinence; admitting dx pyelonephritis/PNA. Urine cx's showed candida. Hospital course complicated by ARF possibly associated with contrast imaging & heart failure requiring diuresis. Also complicated by possible PEA arrest on 2/8, attributed to hypovolemia. Discharge meds include ASA, no warfarin.   2/24 admitted for BRBPR. GI consulted --> EGD/sigmoidoscopy could not identify source --> tagged RBC study positive for bleed at junction of L colon/sigmoid. Cardiology consulted for preop eval. Pt underwent ex-lap & Dr. Ezzard Standing performed extended left/sigmoid colectomy, Hartmann's pouch, and transverse colostomy   Studies  2/2 CT abd/pelvis w/contrast: cardiomegaly, bibasilar airspace dz. Mild diffuse bladder wall thickening. Severe sigmoid diverticulosis  2/2 VQ scan:neg 2/4 TTE EF 35-40%, mild dd, severe MR 2/24 CXR reviewed: improved pulm venous HTN  Lines R IJ 2/24>>>   PMHx  -Systolic HF with CAD: CHF EF OF 35-40%,LAST ECHO IN 2011; h/o MI; 2 lpm home O2  -PE on June 10, 2007.now has an IVC filter in place  -CVA  -Insulin-dependent diabetic.  -GIB - 1993 colo showed internal hemorrhoids and diverticulosis. 2001 colo showed internal and external hemorrhoids with significant L diverticulosis with fresh blood in the descending Colon. LGIB in 07/2004 thought 2/2 diverticulosis. 2011 GIB on warfarin  -blind  -?COPD - no PFTs available  -CKD  Past Medical History  Diagnosis Date  . Diabetes mellitus   . Hypertension   . Vascular disease   . Shortness of breath   . Bladder infection   . COPD (chronic obstructive pulmonary disease)   . Myocardial infarction   .  Stroke   . GERD (gastroesophageal reflux disease)     Past Surgical History  Procedure Date  . Bladder tack   . Femoral artery stent   . Abdominal hysterectomy     History reviewed. No pertinent family history.  Social History:  reports that she has never smoked. She does not have any smokeless tobacco history on file. She reports that she does not drink alcohol or use illicit drugs.  Allergies:  Allergies  Allergen Reactions  . Avelox (Moxifloxacin Hcl In Nacl)   . Coumadin Other (See Comments)    Causes severe bleeding  . Proton Pump Inhibitors Other (See Comments)    Thrombocytopenia    Medications: I have reviewed the patient's current medications.  Results for orders placed during the hospital encounter of 07/12/11 (from the past 48 hour(s))  CBC     Status: Abnormal   Collection Time   07/12/11  6:20 AM      Component Value Range Comment   WBC 8.2  4.0 - 10.5 (K/uL)    RBC 4.16  3.87 - 5.11 (MIL/uL)    Hemoglobin 12.1  12.0 - 15.0 (g/dL)    HCT 16.1  09.6 - 04.5 (%)    MCV 92.8  78.0 - 100.0 (fL)    MCH 29.1  26.0 - 34.0 (pg)    MCHC 31.3  30.0 - 36.0 (g/dL)    RDW 40.9 (*) 81.1 - 15.5 (%)    Platelets 85 (*) 150 - 400 (K/uL)   DIFFERENTIAL     Status: Normal   Collection Time   07/12/11  6:20 AM  Component Value Range Comment   Neutrophils Relative 69  43 - 77 (%)    Lymphocytes Relative 19  12 - 46 (%)    Monocytes Relative 9  3 - 12 (%)    Eosinophils Relative 2  0 - 5 (%)    Basophils Relative 1  0 - 1 (%)    Neutro Abs 5.6  1.7 - 7.7 (K/uL)    Lymphs Abs 1.6  0.7 - 4.0 (K/uL)    Monocytes Absolute 0.7  0.1 - 1.0 (K/uL)    Eosinophils Absolute 0.2  0.0 - 0.7 (K/uL)    Basophils Absolute 0.1  0.0 - 0.1 (K/uL)    RBC Morphology ELLIPTOCYTES      Smear Review PLATELET COUNT CONFIRMED BY SMEAR     COMPREHENSIVE METABOLIC PANEL     Status: Abnormal   Collection Time   07/12/11  6:20 AM      Component Value Range Comment   Sodium 137  135 - 145  (mEq/L)    Potassium 5.6 (*) 3.5 - 5.1 (mEq/L)    Chloride 101  96 - 112 (mEq/L)    CO2 27  19 - 32 (mEq/L)    Glucose, Bld 247 (*) 70 - 99 (mg/dL)    BUN 65 (*) 6 - 23 (mg/dL)    Creatinine, Ser 1.61 (*) 0.50 - 1.10 (mg/dL)    Calcium 8.8  8.4 - 10.5 (mg/dL)    Total Protein 6.8  6.0 - 8.3 (g/dL)    Albumin 2.8 (*) 3.5 - 5.2 (g/dL)    AST 52 (*) 0 - 37 (U/L) NO VISIBLE HEMOLYSIS   ALT 163 (*) 0 - 35 (U/L)    Alkaline Phosphatase 204 (*) 39 - 117 (U/L)    Total Bilirubin 1.5 (*) 0.3 - 1.2 (mg/dL)    GFR calc non Af Amer 27 (*) >90 (mL/min)    GFR calc Af Amer 32 (*) >90 (mL/min)   LACTIC ACID, PLASMA     Status: Normal   Collection Time   07/12/11  6:20 AM      Component Value Range Comment   Lactic Acid, Venous 1.9  0.5 - 2.2 (mmol/L)   TROPONIN I     Status: Normal   Collection Time   07/12/11  6:20 AM      Component Value Range Comment   Troponin I <0.30  <0.30 (ng/mL)   APTT     Status: Normal   Collection Time   07/12/11  6:20 AM      Component Value Range Comment   aPTT 31  24 - 37 (seconds)   PROTIME-INR     Status: Abnormal   Collection Time   07/12/11  6:20 AM      Component Value Range Comment   Prothrombin Time 16.0 (*) 11.6 - 15.2 (seconds)    INR 1.25  0.00 - 1.49    DIGOXIN LEVEL     Status: Abnormal   Collection Time   07/12/11  6:20 AM      Component Value Range Comment   Digoxin Level 0.5 (*) 0.8 - 2.0 (ng/mL)   RETICULOCYTES     Status: Abnormal   Collection Time   07/12/11  6:30 AM      Component Value Range Comment   Retic Ct Pct 3.2 (*) 0.4 - 3.1 (%)    RBC. 4.26  3.87 - 5.11 (MIL/uL)    Retic Count, Manual 136.3  19.0 - 186.0 (K/uL)   IRON AND  TIBC     Status: Abnormal   Collection Time   07/12/11  6:30 AM      Component Value Range Comment   Iron 32 (*) 42 - 135 (ug/dL)    TIBC 409  811 - 914 (ug/dL)    Saturation Ratios 11 (*) 20 - 55 (%)    UIBC 247  125 - 400 (ug/dL)   FERRITIN     Status: Abnormal   Collection Time   07/12/11  6:30 AM       Component Value Range Comment   Ferritin 331 (*) 10 - 291 (ng/mL)   TYPE AND SCREEN     Status: Normal (Preliminary result)   Collection Time   07/12/11  7:10 AM      Component Value Range Comment   ABO/RH(D) A POS      Antibody Screen NEG      Sample Expiration 07/15/2011      Unit Number [id number]      Blood Component Type RED CELLS,LR      Unit division 00      Status of Unit ISSUED      Transfusion Status OK TO TRANSFUSE      Crossmatch Result Compatible      Unit Number [id number]      Blood Component Type RED CELLS,LR      Unit division 00      Status of Unit ISSUED      Transfusion Status OK TO TRANSFUSE      Crossmatch Result Compatible      Unit Number [id number]      Blood Component Type RED CELLS,LR      Unit division 00      Status of Unit ALLOCATED      Transfusion Status OK TO TRANSFUSE      Crossmatch Result Compatible      Unit Number [id number]      Blood Component Type RED CELLS,LR      Unit division 00      Status of Unit ISSUED      Transfusion Status OK TO TRANSFUSE      Crossmatch Result Compatible      Unit Number [id number]      Blood Component Type RED CELLS,LR      Unit division 00      Status of Unit ALLOCATED      Transfusion Status OK TO TRANSFUSE      Crossmatch Result Compatible      Unit Number [id number]      Blood Component Type RED CELLS,LR      Unit division 00      Status of Unit ISSUED      Transfusion Status OK TO TRANSFUSE      Crossmatch Result Compatible      Unit Number [id number]      Blood Component Type RED CELLS,LR      Unit division 00      Status of Unit ALLOCATED      Transfusion Status OK TO TRANSFUSE      Crossmatch Result Compatible      Unit Number [id number]      Blood Component Type RED CELLS,LR      Unit division 00      Status of Unit ALLOCATED      Transfusion Status OK TO TRANSFUSE      Crossmatch Result Compatible     POTASSIUM     Status: Abnormal   Collection Time   07/12/11  8:25 AM      Component  Value Range Comment   Potassium 5.4 (*) 3.5 - 5.1 (mEq/L)   PREPARE RBC (CROSSMATCH)     Status: Normal   Collection Time   07/12/11  8:30 AM      Component Value Range Comment   Order Confirmation ORDER PROCESSED BY BLOOD BANK     MRSA PCR SCREENING     Status: Normal   Collection Time   07/12/11 10:50 AM      Component Value Range Comment   MRSA by PCR NEGATIVE  NEGATIVE    GLUCOSE, CAPILLARY     Status: Abnormal   Collection Time   07/12/11 11:49 AM      Component Value Range Comment   Glucose-Capillary 240 (*) 70 - 99 (mg/dL)    Comment 1 Documented in Chart      Comment 2 Notify RN     PREPARE PLATELET PHERESIS     Status: Normal (Preliminary result)   Collection Time   07/12/11 12:00 PM      Component Value Range Comment   Unit Number [id number]      Blood Component Type PLTPHER LR3      Unit division 00      Status of Unit ISSUED      Transfusion Status OK TO TRANSFUSE     CBC     Status: Abnormal   Collection Time   07/12/11  5:00 PM      Component Value Range Comment   WBC 9.0  4.0 - 10.5 (K/uL)    RBC 3.96  3.87 - 5.11 (MIL/uL)    Hemoglobin 11.7 (*) 12.0 - 15.0 (g/dL)    HCT 78.4 (*) 69.6 - 46.0 (%)    MCV 88.9  78.0 - 100.0 (fL)    MCH 29.5  26.0 - 34.0 (pg)    MCHC 33.2  30.0 - 36.0 (g/dL)    RDW 29.5 (*) 28.4 - 15.5 (%)    Platelets 113 (*) 150 - 400 (K/uL)   CARDIAC PANEL(CRET KIN+CKTOT+MB+TROPI)     Status: Abnormal   Collection Time   07/12/11  5:00 PM      Component Value Range Comment   Total CK 106  7 - 177 (U/L)    CK, MB 4.6 (*) 0.3 - 4.0 (ng/mL)    Troponin I <0.30  <0.30 (ng/mL)    Relative Index 4.3 (*) 0.0 - 2.5    GLUCOSE, CAPILLARY     Status: Abnormal   Collection Time   07/12/11  6:02 PM      Component Value Range Comment   Glucose-Capillary 235 (*) 70 - 99 (mg/dL)    Comment 1 Documented in Chart      Comment 2 Notify RN     PREPARE PLATELET PHERESIS     Status: Normal (Preliminary result)   Collection Time   07/12/11  6:30 PM       Component Value Range Comment   Unit Number [id number]      Blood Component Type PLTPHER LR3      Unit division 00      Status of Unit ISSUED      Transfusion Status OK TO TRANSFUSE     PREPARE FRESH FROZEN PLASMA     Status: Normal (Preliminary result)   Collection Time   07/12/11  7:00 PM      Component Value Range Comment   Unit Number [id number]      Blood Component Type THAWED  PLASMA      Unit division 00      Status of Unit ISSUED      Transfusion Status OK TO TRANSFUSE      Unit Number [id number]      Blood Component Type THAWED PLASMA      Unit division 00      Status of Unit ALLOCATED      Transfusion Status OK TO TRANSFUSE     GLUCOSE, CAPILLARY     Status: Abnormal   Collection Time   07/12/11  7:33 PM      Component Value Range Comment   Glucose-Capillary 238 (*) 70 - 99 (mg/dL)    Comment 1 Documented in Chart      Comment 2 Call MD NNP PA CNM     GLUCOSE, CAPILLARY     Status: Abnormal   Collection Time   07/12/11  9:05 PM      Component Value Range Comment   Glucose-Capillary 207 (*) 70 - 99 (mg/dL)   BASIC METABOLIC PANEL     Status: Abnormal (Preliminary result)   Collection Time   07/12/11  9:36 PM      Component Value Range Comment   Sodium 142  135 - 145 (mEq/L)    Potassium 4.7  3.5 - 5.1 (mEq/L)    Chloride PENDING  96 - 112 (mEq/L)    CO2 23  19 - 32 (mEq/L)    Glucose, Bld 233 (*) 70 - 99 (mg/dL)    BUN 53 (*) 6 - 23 (mg/dL)    Creatinine, Ser 5.62 (*) 0.50 - 1.10 (mg/dL)    Calcium 6.7 (*) 8.4 - 10.5 (mg/dL)    GFR calc non Af Amer 35 (*) >90 (mL/min)    GFR calc Af Amer 40 (*) >90 (mL/min)   CBC     Status: Abnormal   Collection Time   07/12/11  9:36 PM      Component Value Range Comment   WBC 7.8  4.0 - 10.5 (K/uL)    RBC 2.83 (*) 3.87 - 5.11 (MIL/uL)    Hemoglobin 8.4 (*) 12.0 - 15.0 (g/dL)    HCT 13.0 (*) 86.5 - 46.0 (%)    MCV 89.4  78.0 - 100.0 (fL)    MCH 29.7  26.0 - 34.0 (pg)    MCHC 33.2  30.0 - 36.0 (g/dL)    RDW 78.4 (*) 69.6 -  15.5 (%)    Platelets 107 (*) 150 - 400 (K/uL) CONSISTENT WITH PREVIOUS RESULT  DIFFERENTIAL     Status: Abnormal   Collection Time   07/12/11  9:36 PM      Component Value Range Comment   Neutrophils Relative 86 (*) 43 - 77 (%)    Lymphocytes Relative 10 (*) 12 - 46 (%)    Monocytes Relative 4  3 - 12 (%)    Eosinophils Relative 0  0 - 5 (%)    Basophils Relative 0  0 - 1 (%)    Neutro Abs 6.7  1.7 - 7.7 (K/uL)    Lymphs Abs 0.8  0.7 - 4.0 (K/uL)    Monocytes Absolute 0.3  0.1 - 1.0 (K/uL)    Eosinophils Absolute 0.0  0.0 - 0.7 (K/uL)    Basophils Absolute 0.0  0.0 - 0.1 (K/uL)    RBC Morphology POLYCHROMASIA PRESENT      WBC Morphology MILD LEFT SHIFT (1-5% METAS, OCC MYELO, OCC BANDS)       Nm Gi Blood Loss  07/12/2011  *RADIOLOGY REPORT*  Clinical Data: 78 year old female with GI bleed.  NUCLEAR MEDICINE GASTROINTESTINAL BLEEDING STUDY  Technique:  Sequential abdominal images were obtained following intravenous administration of Tc-7m labeled red blood cells.  Radiopharmaceutical: 23.8MILLI CURIE ULTRATAG TECHNETIUM TC 43M- LABELED RED BLOOD CELLS IV KIT  Comparison: None  Findings: Activity identified within the left colon is noted, compatible with GI bleed.  The source of this GI bleed likely originates in the descending colon. No other abnormalities are present.  IMPRESSION: Active GI bleeding from the left/descending colon.  Original Report Authenticated By: Rosendo Gros, M.D.    Review of Systems  Unable to perform ROS: medical condition   Blood pressure 139/61, pulse 90, temperature 96.8 F (36 C), temperature source Axillary, resp. rate 16, height 5\' 3"  (1.6 m), weight 141 lb 12.1 oz (64.3 kg), SpO2 100.00%. Physical Exam  Constitutional: No distress.       Elderly African-American female likely comfortably in bed staring at ceiling  HENT:  Head: Normocephalic.  Eyes: No scleral icterus.       Pupils are equal & round & constricted & minimally reactive to light  Neck:  Normal range of motion.  Cardiovascular: Normal rate and regular rhythm.  Exam reveals no gallop and no friction rub.   Murmur (3/6 SEM at LUSB) heard. Respiratory: Effort normal. No respiratory distress. She has no wheezes. She has no rales.       On 3 lpm nasal cannula  GI: Soft. She exhibits no distension. There is no tenderness.       Colostomy bag with dark red blood inside  Musculoskeletal: Normal range of motion. She exhibits no edema.  Lymphadenopathy:    She has no cervical adenopathy.  Neurological: She is alert.       Eyes open spontaneously.  She stares at ceiling & for the most part doesn't track room occupants.  She is non-verbal.  When I ask her to show me a thumbs up, she does minimal movement of L hand, and when asked to do so on the other side, simply lifts her R hand up.  She doesn't deviate eyes far from midfield.  There is no facial droop.  Her tongue protrudes without deviation.    Skin: Skin is warm and dry. She is not diaphoretic.    Assessment/Plan: 85yoaaf s/p hemicolectomy for diverticular bleed  LGIB 2/2 diverticulosis -possibly augmented by ASA & mild thrombocytopenia, which itself may have been from ASA -pt is now s/p hartman's pouch formation, diverting colostomy -will need early nutrition, but current mental status precludes swallowing (she was given fentanyl, versed, morphine); if mental status doesn't improve by morning, will need swallow evaluation or DHT; however, later discussion with [name] (pt's daughter) reveals this is pt's baseline & is in fact improved -will trend Hb overnight -would transfuse for Hb<7, PLT<50k -CBC/BMP in AM  DM -glu persistently >200 -will order gluo-stabilizer with D5NS at 50/h for now as pt will need tighter glu management -likely can be transitioned to [place] insulin in the morning  Chronic systolic HF with low EF & severe MR -continue digoxin -will add low dose lisinopril, suspect first dose will be tomorrow AM when  swallowing -continue lasix 20mg  IV BID; will eventually need to restart home dose lasix 40mg  PO qd -will start toprol 12.5mg  -will need to stop D5NS when off Glucostabilizer  DVT ppx: SCD's  H/o aspiration -per [name], pt has been having a difficult time with swallowing -pt will need swallow evaluation  when surgery okay with pt attempting feeding  Daughter updated at bedside.  Total critical care time >71min   [name] 07/12/2011, 10:59 PM

## 2011-07-12 NOTE — Progress Notes (Signed)
[facility] hospitalist progress note. Chief complaint. Anemia. History of present illness. 78 year old female admitted with GI bleed. The source of bleeding that diverticular post tag red blood cell study. She was taken to surgery tonight by Dr. Ezzard Standing. He was kind enough to update me postoperatively. The patient was anemic and thrombocytopenic. The patient was transfused packed red blood cells and platelets over the course of surgery. I did go to bedside to review the patient postoperatively and ensure that she remained stable. She has been transferred from PACU now to the ICU/step down. She had a followup CBC postoperatively and this has resulted showing hemoglobin 8.4 and platelets 107. Her abdominal dressing remains clean and dry. There is a surgical drain in the abdomen and this has produced about 150 cc of bright red blood. Vital signs. Temperature 96.8, pulse 94, respirations 16, blood pressure 142/60. O2 sats 100%. Gen. appearance. This is an elderly female who is alert though minimally verbal. No evidence of distress. Cardiac. Rate and rhythm regular with 3/6 systolic ejection murmur. No jugular venous distention. No calf pain, negative Homans. Lungs. Reduced with poor respiratory effort but clear. No distress or cough. Stable O2 sats. Abdomen. Again there is a surgical dressing in place which is clean and dry. There's a surgical drain with about 150 cc of bright red blood. Impression/plan. Problem #1 anemia and thrombocytopenia. I last hemoglobin and platelets roughly stable. I will repeat a hemoglobin and hematocrit at 01:00. The patient is due to have a full CBC with the a.m. labs at 05:00. We'll follow for these results and consider transfusion if needed. Clinically the patient appears quite stable postoperatively.

## 2011-07-12 NOTE — Progress Notes (Signed)
Pt received 4 units of PRBC's and 2 units of platelets during day.  Pt went to Nuclear Medicine for bleeding scan.  Returned to room @1800  and cleaned pt again for large amount of bloody stool.  Pre-op checklist done prior to pt going to OR for colectomy, possible colostomy.

## 2011-07-12 NOTE — Preoperative (Signed)
Beta Blockers   Reason not to administer Beta Blockers:Hold beta blocker due to hypotension, Hold beta blocker due to hypovolemia, Will monitor and give beta blocker as needed in periop. period.

## 2011-07-12 NOTE — Progress Notes (Signed)
General Surgery note (updated at 1750):  GI - Dr. Chip Boer  Upper endo - negative.  Sigmoidoscopy - significant sigmoid diverticulosis.  He was only to get to 40 cm.  Nuclear tagged RBC study - positive for bleed at junction of left colon/sigmoid colon - discussed with Dr. Kandy Garrison.  Hgb at 5 PM - 11.7, Plt Ct - 113,000  Patient would be best served with colectomy.  Discussed with family.  Discussed indications and potential complications of surgery. Plan: left/sigmoid colectomy +/- colostomy  Ovidio Kin, MD, [facility] Pager: [phone] Office phone:  [phone]

## 2011-07-13 ENCOUNTER — Encounter (HOSPITAL_COMMUNITY): Payer: Self-pay | Admitting: Gastroenterology

## 2011-07-13 LAB — PREPARE PLATELET PHERESIS
Unit division: 0
Unit division: 0

## 2011-07-13 LAB — CBC
HCT: 22.9 % — ABNORMAL LOW (ref 36.0–46.0)
MCV: 90.5 fL (ref 78.0–100.0)
Platelets: 117 10*3/uL — ABNORMAL LOW (ref 150–400)
RBC: 2.53 MIL/uL — ABNORMAL LOW (ref 3.87–5.11)
WBC: 12.4 10*3/uL — ABNORMAL HIGH (ref 4.0–10.5)

## 2011-07-13 LAB — PREPARE FRESH FROZEN PLASMA: Unit division: 0

## 2011-07-13 LAB — COMPREHENSIVE METABOLIC PANEL
Albumin: 1.8 g/dL — ABNORMAL LOW (ref 3.5–5.2)
BUN: 49 mg/dL — ABNORMAL HIGH (ref 6–23)
Creatinine, Ser: 1.39 mg/dL — ABNORMAL HIGH (ref 0.50–1.10)
GFR calc Af Amer: 39 mL/min — ABNORMAL LOW (ref >90)
Total Bilirubin: 0.9 mg/dL (ref 0.3–1.2)
Total Protein: 3.9 g/dL — ABNORMAL LOW (ref 6.0–8.3)

## 2011-07-13 LAB — HEMOGLOBIN AND HEMATOCRIT, BLOOD
HCT: 24.8 % — ABNORMAL LOW (ref 36.0–46.0)
HCT: 30.3 % — ABNORMAL LOW (ref 36.0–46.0)
Hemoglobin: 8.3 g/dL — ABNORMAL LOW (ref 12.0–15.0)
Hemoglobin: 10.5 g/dL — ABNORMAL LOW (ref 12.0–15.0)

## 2011-07-13 LAB — GLUCOSE, CAPILLARY
Glucose-Capillary: 190 mg/dL — ABNORMAL HIGH (ref 70–99)
Glucose-Capillary: 140 mg/dL — ABNORMAL HIGH (ref 70–99)
Glucose-Capillary: 194 mg/dL — ABNORMAL HIGH (ref 70–99)
Glucose-Capillary: 116 mg/dL — ABNORMAL HIGH (ref 70–99)
Glucose-Capillary: 113 mg/dL — ABNORMAL HIGH (ref 70–99)
Glucose-Capillary: 158 mg/dL — ABNORMAL HIGH (ref 70–99)

## 2011-07-13 LAB — APTT: aPTT: 34 seconds (ref 24–37)

## 2011-07-13 LAB — PROTIME-INR: INR: 1.49 (ref 0.00–1.49)

## 2011-07-13 MED ORDER — GUAIFENESIN ER 600 MG PO TB12
600.0000 mg | ORAL_TABLET | Freq: Two times a day (BID) | ORAL | Status: DC
  Administered 2011-07-13 – 2011-07-20 (×14): 600 mg via ORAL
  Filled 2011-07-13 (×17): qty 1

## 2011-07-13 MED ORDER — INSULIN GLARGINE 100 UNIT/ML ~~LOC~~ SOLN
10.0000 [IU] | Freq: Every day | SUBCUTANEOUS | Status: DC
  Administered 2011-07-13 – 2011-07-18 (×6): 10 [IU] via SUBCUTANEOUS
  Filled 2011-07-13: qty 3

## 2011-07-13 MED ORDER — METOPROLOL TARTRATE 12.5 MG HALF TABLET
12.5000 mg | ORAL_TABLET | Freq: Two times a day (BID) | ORAL | Status: DC
  Administered 2011-07-13 – 2011-07-20 (×12): 12.5 mg via ORAL
  Filled 2011-07-13 (×18): qty 1

## 2011-07-13 MED ORDER — BIOTENE DRY MOUTH MT LIQD
15.0000 mL | Freq: Two times a day (BID) | OROMUCOSAL | Status: DC
  Administered 2011-07-13 – 2011-07-20 (×14): 15 mL via OROMUCOSAL

## 2011-07-13 MED ORDER — METOPROLOL SUCCINATE 12.5 MG HALF TABLET
12.5000 mg | ORAL_TABLET | Freq: Every day | ORAL | Status: DC
  Filled 2011-07-13: qty 1

## 2011-07-13 MED ORDER — MORPHINE SULFATE 2 MG/ML IJ SOLN
2.0000 mg | INTRAMUSCULAR | Status: DC | PRN
  Administered 2011-07-13: 2 mg via INTRAVENOUS
  Administered 2011-07-14: 1 mg via INTRAVENOUS
  Administered 2011-07-14 – 2011-07-17 (×7): 2 mg via INTRAVENOUS
  Filled 2011-07-13 (×10): qty 1

## 2011-07-13 MED ORDER — DIGOXIN 0.25 MG/ML IJ SOLN
0.2500 mg | Freq: Every day | INTRAMUSCULAR | Status: DC
  Administered 2011-07-14 – 2011-07-20 (×7): 0.25 mg via INTRAVENOUS
  Filled 2011-07-13 (×11): qty 1

## 2011-07-13 MED ORDER — SODIUM CHLORIDE 0.9 % IV BOLUS (SEPSIS)
300.0000 mL | Freq: Once | INTRAVENOUS | Status: AC
  Administered 2011-07-13: 300 mL via INTRAVENOUS

## 2011-07-13 MED ORDER — INSULIN ASPART 100 UNIT/ML ~~LOC~~ SOLN
0.0000 [IU] | SUBCUTANEOUS | Status: DC
  Administered 2011-07-13 – 2011-07-14 (×2): 3 [IU] via SUBCUTANEOUS
  Administered 2011-07-14: 8 [IU] via SUBCUTANEOUS
  Administered 2011-07-14 (×2): 3 [IU] via SUBCUTANEOUS
  Administered 2011-07-15 (×2): 2 [IU] via SUBCUTANEOUS
  Administered 2011-07-15 (×3): 3 [IU] via SUBCUTANEOUS
  Administered 2011-07-16: 5 [IU] via SUBCUTANEOUS
  Administered 2011-07-16 (×2): 3 [IU] via SUBCUTANEOUS
  Administered 2011-07-16: 5 [IU] via SUBCUTANEOUS
  Administered 2011-07-16 (×2): 3 [IU] via SUBCUTANEOUS
  Administered 2011-07-17: 8 [IU] via SUBCUTANEOUS
  Administered 2011-07-17 (×3): 3 [IU] via SUBCUTANEOUS
  Administered 2011-07-17: 8 [IU] via SUBCUTANEOUS
  Administered 2011-07-17 – 2011-07-18 (×2): 5 [IU] via SUBCUTANEOUS
  Administered 2011-07-18: 2 [IU] via SUBCUTANEOUS
  Administered 2011-07-18: 8 [IU] via SUBCUTANEOUS
  Administered 2011-07-18: 3 [IU] via SUBCUTANEOUS

## 2011-07-13 MED ORDER — CHLORHEXIDINE GLUCONATE 0.12 % MT SOLN
15.0000 mL | Freq: Two times a day (BID) | OROMUCOSAL | Status: DC
  Administered 2011-07-13 – 2011-07-20 (×14): 15 mL via OROMUCOSAL
  Filled 2011-07-13 (×18): qty 15

## 2011-07-13 MED ORDER — FAMOTIDINE 20 MG PO TABS
20.0000 mg | ORAL_TABLET | Freq: Every day | ORAL | Status: DC
  Administered 2011-07-13 – 2011-07-19 (×7): 20 mg via ORAL
  Filled 2011-07-13 (×10): qty 1

## 2011-07-13 NOTE — Progress Notes (Signed)
CRITICAL VALUE ALERT  Critical value received: Ca 6.5  Date of notification: 07/13/2011  Time of notification: 0404 Critical value read back:yes  Nurse who received alert:  Mayme Genta  MD notified (1st page): Ezzard Standing Time of first page:  [phone]  MD notified (2nd page):  Time of second page:  Responding MD: Ezzard Standing Time MD responded: [phone]

## 2011-07-13 NOTE — Progress Notes (Signed)
UR complete 

## 2011-07-13 NOTE — Progress Notes (Signed)
1 Day Post-Op  Subjective: Pain ok. Had some bleeding overnight from colostomy site-Dr Ezzard Standing placed a suture. So far, recvd 6 units prbc/24hrs, 2 FFP, 2 plts. 2 more FFP ordered.  Vitals stable.  Pain ok  Objective: Vital signs in last 24 hours: Temp:  [96.5 F (35.8 C)-98 F (36.7 C)] 96.9 F (36.1 C) (02/25 0718) Pulse Rate:  [66-96] 91  (02/25 0718) Resp:  [3-26] 3  (02/25 0718) BP: (92-142)/(32-61) 122/48 mmHg (02/25 0718) SpO2:  [85 %-100 %] 96 % (02/25 0718) Weight:  [141 lb 12.1 oz (64.3 kg)] 141 lb 12.1 oz (64.3 kg) (02/24 1100) Last BM Date: 07/12/11  Intake/Output from previous day: 02/24 0701 - 02/25 0700 In: 1610.9 [I.V.:5873.4; UEAVW:0981; IV Piggyback:170] Out: 1465 [Urine:850; Stool:565; Blood:50] Intake/Output this shift:    Alert, NAD CTA ant but not taking deep breaths Reg Soft, nd, min TTP. HypoBS. Ostomy-pink, viable, some edema. No air in bag +scds. No edema  Lab Results:   Basename 07/13/11 0300 07/13/11 0125 07/12/11 2136  WBC 12.4* -- 7.8  HGB 7.6* 8.3* --  HCT 22.9* 24.8* --  PLT 117* -- 107*   BMET  Basename 07/13/11 0300 07/12/11 2136  NA 143 142  K 5.2* 4.7  CL 115* 113*  CO2 23 23  GLUCOSE 215* 233*  BUN 49* 53*  CREATININE 1.39* 1.37*  CALCIUM 6.4* 6.7*   PT/INR  Basename 07/13/11 0317 07/12/11 0620  LABPROT 18.3* 16.0*  INR 1.49 1.25   ABG No results found for this basename: PHART:2,PCO2:2,PO2:2,HCO3:2 in the last 72 hours  Studies/Results: Dg Chest 1 View  07/13/2011  *RADIOLOGY REPORT*  Clinical Data: Central line placement.  CHEST - 1 VIEW  Comparison: Chest radiograph performed 06/29/2011  Findings: The patient's right IJ line is noted ending about the cavoatrial junction.  The lungs remain hypoexpanded.  Previously noted diffuse bilateral airspace opacification has improved, with mild residual vascular congestion and vascular crowding noted, but no significant pulmonary edema now seen.  No pleural effusion or  pneumothorax is identified.  The cardiomediastinal silhouette remains mildly enlarged. Calcification is noted within the thoracic aorta.  No acute osseous abnormalities are seen.  IMPRESSION:  1.  Right IJ line noted ending about the cavoatrial junction. 2.  Lungs remain hypoexpanded; diffuse bilateral airspace opacification has significantly improved, with mild residual vascular congestion and mild cardiomegaly seen.  No significant pulmonary edema now present.  Original Report Authenticated By: Tonia Ghent, M.D.   Nm Gi Blood Loss  07/12/2011  *RADIOLOGY REPORT*  Clinical Data: 78 year old female with GI bleed.  NUCLEAR MEDICINE GASTROINTESTINAL BLEEDING STUDY  Technique:  Sequential abdominal images were obtained following intravenous administration of Tc-74m labeled red blood cells.  Radiopharmaceutical: 23.8MILLI CURIE ULTRATAG TECHNETIUM TC 73M- LABELED RED BLOOD CELLS IV KIT  Comparison: None  Findings: Activity identified within the left colon is noted, compatible with GI bleed.  The source of this GI bleed likely originates in the descending colon. No other abnormalities are present.  IMPRESSION: Active GI bleeding from the left/descending colon.  Original Report Authenticated By: Rosendo Gros, M.D.    Anti-infectives: Anti-infectives     Start     Dose/Rate Route Frequency Ordered Stop   07/13/11 0000   cefOXitin (MEFOXIN) 1 g in dextrose 5 % 50 mL IVPB        1 g 100 mL/hr over 30 Minutes Intravenous Every 6 hours 07/12/11 2133 07/13/11 1759          Assessment/Plan: s/p Procedure(s) (  LRB): EXPLORATORY LAPAROTOMY (N/A), EXTENDED LEFT COLECTOMY, END OSTOMY  GI- await return of bowel function- can have ice chips, sips of water, essential PO meds; consult wound care for new ostomy Renal- uop ok. Keep foley to monitor i/o Heme-acute blood loss anemia- hgb around 7 now. Repeat pending in 2 hrs. i would imagine pt needs a hgb around 8 b/c of cardiac history- will defer to cards  regarding goal Hgb; agree with FFP for elevated INR VTE prophy- hold subcu heparin/lovenox given recent bleeding and anemia; cont scds Pulm- aggressive pulm toilet, IS, out of bed  [name]. Andrey Campanile, MD, FACS General, Bariatric, & Minimally Invasive Surgery [facility], [place]   LOS: 1 day    Atilano Ina 07/13/2011

## 2011-07-13 NOTE — Progress Notes (Signed)
Subjective: Pt awake and alert and able to follow commands. Daughter at bedside. Had bleeding from stoma last night requiring suturing. No active bleeding at present. Objective: Filed Vitals:   07/13/11 0600 07/13/11 0616 07/13/11 0700 07/13/11 0718  BP: 120/40 116/43 127/47 122/48  Pulse: 87 87 88 91  Temp: 96.7 F (35.9 C) 96.5 F (35.8 C)  96.9 F (36.1 C)  TempSrc: Axillary Axillary  Axillary  Resp: 13 6 9 3   Height:      Weight:      SpO2: 96%  96% 96%   Weight change:   Intake/Output Summary (Last 24 hours) at 07/13/11 0831 Last data filed at 07/13/11 0700  Gross per 24 hour  Intake 7698.4 ml  Output   1465 ml  Net 6233.4 ml    General: Alert, awake, oriented x3, in no acute distress.  HEENT: [place]/AT PEERL, EOMI (Pt legally blind) Neck: Trachea midline,  no masses, no thyromegal,y no JVD, no carotid bruit OROPHARYNX:  Moist, No exudate/ erythema/lesions.  Heart: Regular rate and rhythm, without murmurs, rubs, gallops, Sinus tachycardia on telemetry. Lungs: Clear to auscultation, no wheezing or rhonchi noted. No increased vocal fremitus resonant to percussion  Abdomen: Soft, nontender, nondistended, positive bowel sounds..  Neuro: No focal neurological deficits noted cranial nerves II through XII grossly intact. DTRs 2+ bilaterally upper and lower extremities. Strength: moving bilateral upper and lower extremities. Musculoskeletal:extremities cool. Psychiatric: Patient alert and oriented x3.      Lab Results:  [facility] 07/13/11 0300 07/12/11 2136  NA 143 142  K 5.2* 4.7  CL 115* 113*  CO2 23 23  GLUCOSE 215* 233*  BUN 49* 53*  CREATININE 1.39* 1.37*  CALCIUM 6.4* 6.7*  MG -- --  PHOS -- --    Basename 07/13/11 0300 07/12/11 0620  AST 24 52*  ALT 55* 163*  ALKPHOS 72 204*  BILITOT 0.9 1.5*  PROT 3.9* 6.8  ALBUMIN 1.8* 2.8*   No results found for this basename: LIPASE:2,AMYLASE:2 in the last 72 hours  Basename 07/13/11 0300 07/13/11 0125 07/12/11  2136 07/12/11 0620  WBC 12.4* -- 7.8 --  NEUTROABS -- -- 6.7 5.6  HGB 7.6* 8.3* -- --  HCT 22.9* 24.8* -- --  MCV 90.5 -- 89.4 --  PLT 117* -- 107* --    Basename 07/12/11 1700 07/12/11 0620  CKTOTAL 106 --  CKMB 4.6* --  CKMBINDEX -- --  TROPONINI <0.30 <0.30   No components found with this basename: POCBNP:3 No results found for this basename: DDIMER:2 in the last 72 hours No results found for this basename: HGBA1C:2 in the last 72 hours No results found for this basename: CHOL:2,HDL:2,LDLCALC:2,TRIG:2,CHOLHDL:2,LDLDIRECT:2 in the last 72 hours No results found for this basename: TSH,T4TOTAL,FREET3,T3FREE,THYROIDAB in the last 72 hours  Basename 07/12/11 0630  VITAMINB12 --  FOLATE --  FERRITIN 331*  TIBC 279  IRON 32*  RETICCTPCT 3.2*    Micro Results: Recent Results (from the past 240 hour(s))  MRSA PCR SCREENING     Status: Normal   Collection Time   07/12/11 10:50 AM      Component Value Range Status Comment   MRSA by PCR NEGATIVE  NEGATIVE  Final     Studies/Results: Dg Chest 1 View  07/13/2011  *RADIOLOGY REPORT*  Clinical Data: Central line placement.  CHEST - 1 VIEW  Comparison: Chest radiograph performed 06/29/2011  Findings: The patient's right IJ line is noted ending about the cavoatrial junction.  The lungs remain hypoexpanded.  Previously  noted diffuse bilateral airspace opacification has improved, with mild residual vascular congestion and vascular crowding noted, but no significant pulmonary edema now seen.  No pleural effusion or pneumothorax is identified.  The cardiomediastinal silhouette remains mildly enlarged. Calcification is noted within the thoracic aorta.  No acute osseous abnormalities are seen.  IMPRESSION:  1.  Right IJ line noted ending about the cavoatrial junction. 2.  Lungs remain hypoexpanded; diffuse bilateral airspace opacification has significantly improved, with mild residual vascular congestion and mild cardiomegaly seen.  No significant  pulmonary edema now present.  Original Report Authenticated By: Tonia Ghent, M.D.   Dg Chest 2 View  06/29/2011  *RADIOLOGY REPORT*  Clinical Data: Follow up pneumonia  CHEST - 2 VIEW  Comparison: 06/28/2011  Findings: Heart size is normal.  The lung volumes are low.  Diffuse bilateral lung opacities have increased from previous exam.  There is worsening aeration to the entire right lung.  IMPRESSION:  1.  Interval progression of  pulmonary edema and/or multifocal pneumonia.  Original Report Authenticated By: Rosealee Albee, M.D.   Dg Lumbar Spine Complete  07/07/2011  *RADIOLOGY REPORT*  Clinical Data: Pain after fall  LUMBAR SPINE - COMPLETE 4+ VIEW  Comparison:July 09, 2010  Findings:  An IVC filter is stable in position at the L2 level. Significant disc space narrowing at L4-L5 with anterior osteophytosis remains present and unchanged.  There is also osteophytosis and disc space narrowing at the lower thoracic spine, unchanged.  Facet joint sclerosis from L3-S1 also remains present. No pars interarticularis defects are identified.  There is mild left convex lumbar scoliosis.  The lateral alignment is within normal limits. The vertebral body heights are maintained.  IMPRESSION: No significant change in appearance of degenerative disc disease at L4-L5 and facet joint sclerosis from L3-S1.  If there is clinical concern regarding herniated disc, MRI may be of help.  Original Report Authenticated By: Brandon Melnick, M.D.   Dg Pelvis 1-2 Views  07/07/2011  *RADIOLOGY REPORT*  Clinical Data: Pain after fall  PELVIS - 1-2 VIEW  Comparison: None.  Findings: There is no evidence of hip or pelvic fracture or dislocation.  The hip joint compartments are maintained and symmetric.  The femoral heads are well rounded.  IMPRESSION: There is no evidence of acute abnormality.  Original Report Authenticated By: Brandon Melnick, M.D.   Nm Gi Blood Loss  07/12/2011  *RADIOLOGY REPORT*  Clinical Data: 78 year old  female with GI bleed.  NUCLEAR MEDICINE GASTROINTESTINAL BLEEDING STUDY  Technique:  Sequential abdominal images were obtained following intravenous administration of Tc-36m labeled red blood cells.  Radiopharmaceutical: 23.8MILLI CURIE ULTRATAG TECHNETIUM TC 65M- LABELED RED BLOOD CELLS IV KIT  Comparison: None  Findings: Activity identified within the left colon is noted, compatible with GI bleed.  The source of this GI bleed likely originates in the descending colon. No other abnormalities are present.  IMPRESSION: Active GI bleeding from the left/descending colon.  Original Report Authenticated By: Rosendo Gros, M.D.   Ct Abdomen Pelvis W Contrast  06/20/2011  *RADIOLOGY REPORT*  Clinical Data: Lower abdominal and pelvic pain and pressure. Leukocytosis.  CT ABDOMEN AND PELVIS WITH CONTRAST  Technique:  Multidetector CT imaging of the abdomen and pelvis was performed following the standard protocol during bolus administration of intravenous contrast.  Contrast: OMNIPAQUE IOHEXOL 300 MG/ML IV SOLN  Comparison: 08/13/2004  Findings: Increased cardiomegaly is seen since previous study. Diffuse bibasilar air space opacity is seen as well as a tiny  right pleural effusion.  These findings are consistent with congestive heart failure.  The liver, gallbladder, pancreas, spleen, and adrenal glands are normal in appearance.  IVC filter is seen in place.  Renal cysts and parenchymal scarring again seen bilaterally.  Right extrarenal pelvis noted, without evidence of caliectasis or ureterectasis. Mild diffuse bladder wall thickening and mucosal enhancement is again noted, which may be seen with neurogenic bladder, cystitis, or chronic bladder outlet obstruction.  Previous hysterectomy noted.  Severe diverticulosis seen involving the descending and sigmoid colon, however there is no evidence of diverticulitis.  No other inflammatory process or abnormal fluid collections are identified. No soft tissue masses or  lymphadenopathy identified within the abdomen or pelvis.  IMPRESSION:  1.  Increased cardiomegaly, bibasilar airspace disease, and tiny right pleural effusion, consistent with congestive heart failure. 2.  No acute findings within the abdomen or pelvis. 3.  Mild diffuse bladder wall thickening and enhancement. Differential diagnosis includes neurogenic bladder, cystitis, or chronic bladder outlet obstruction. 3.  Severe sigmoid diverticulosis.  No radiographic evidence of diverticulitis.  Original Report Authenticated By: Danae Orleans, M.D.   US Renal  06/22/2011  *RADIOLOGY REPORT*  Clinical Data: Acute renal failure  RENAL/URINARY TRACT ULTRASOUND COMPLETE  Comparison:  CT 06/20/2011  Findings:  Right Kidney:  11.1 cm in length.  Several small cysts but no large cyst or mass.  There is an extrarenal pelvis but no true hydronephrosis.  Left Kidney:  11.4 cm in length.  Several small cysts including 8- 0.0 x 1.1 x 1.5 cm cyst of the upper medial cortex.  No hydronephrosis or mass lesion.  Bladder:  Foley catheter in place.  IMPRESSION: No evidence of renal obstruction.   A 2 cm upper pole cyst of the left kidney.  Numerous other smaller cysts bilaterally.  Extrarenal pelvis on the right  without evidence of true hydronephrosis.  Original Report Authenticated By: Thomasenia Sales, M.D.   Nm Pulmonary Per & Vent  06/20/2011  *RADIOLOGY REPORT*  Clinical Data: Congestive heart failure.  Pulmonary embolus and January 2009.  IVC filter in place.  Shortness of breath.  Elevated D-dimer.  NM PULMONARY VENTILATION AND PERFUSION SCAN  Radiopharmaceutical: 8.6 mCi xenon 133 inhaled.  4.3 mCi technetium 85m MAA IV.  Comparison: Chest x-ray dated 06/20/2011  Findings: The patient has chronic elevation of the right hemidiaphragm.  The ventilation study is otherwise normal.  The perfusion study is normal.  IMPRESSION: Normal ventilation perfusion lung scan.  No evidence of pulmonary embolism.  Original Report Authenticated By:  Gwynn Burly, M.D.   Dg Chest Port 1 View  06/28/2011  *RADIOLOGY REPORT*  Clinical Data: Shortness of breath.  PORTABLE CHEST - 1 VIEW  Comparison: 06/23/2011.  Findings: The heart is enlarged but stable.  There is tortuosity and calcification of the thoracic aorta.  Low lung volumes with vascular crowding and atelectasis.  There is also moderate vascular congestion and interstitial edema.  Small bilateral pleural effusions are present.  IMPRESSION: CHF with small effusions.  Original Report Authenticated By: P. Loralie Champagne, M.D.   Dg Chest Port 1 View  06/23/2011  *RADIOLOGY REPORT*  Clinical Data: Cough.  Shortness of breath.  Congestive heart failure.  PORTABLE CHEST - 1 VIEW  Comparison: 06/22/2011  Findings: Low lung volumes are present, causing crowding of the pulmonary vasculature.  The interstitial edema shown on the prior exam has considerably improved.  There is continued obscuration of the left hemidiaphragm.  Dense mitral annular calcification noted.  Thoracic spondylosis noted.  IMPRESSION:  1.  Pulmonary venous hypertension noted.  The interstitial edema shown on the prior exam is significantly improved. 2.  Mild cardiomegaly with mitral annular calcification. 3.  Low lung volumes. 4.  Thoracic spondylosis. 5.  Indistinct airspace opacity in the left lower lobe, potentially confluent edema, pneumonia, or atelectasis.  Original Report Authenticated By: Dellia Cloud, M.D.   Dg Chest Port 1 View  06/22/2011  *RADIOLOGY REPORT*  Clinical Data: Shortness of breath.  Question congestive heart failure.  PORTABLE CHEST - 1 VIEW  Comparison: Plain films of the chest 12/01/2010, 07/03/2010 and 07/02/2009.  Findings: There is cardiomegaly with interstitial pulmonary edema. Hazy opacities over the chest bilaterally are compatible with layering pleural effusions.  Calcified mitral annulus is noted.  IMPRESSION: Cardiomegaly and pulmonary edema with bilateral pleural effusions.  Original  Report Authenticated By: Bernadene Bell. D'ALESSIO, M.D.   Dg Abd Acute W/chest  06/20/2011  *RADIOLOGY REPORT*  Clinical Data: Left mid abdominal pain, discomfort  ACUTE ABDOMEN SERIES (ABDOMEN 2 VIEW & CHEST 1 VIEW)  Comparison: 06/10/2010.  Findings:Cardiac leads overlie the chest.  Mitral annular calcification accounts for coarse radiodensities over the cardiac silhouettes. Lung volumes are low with crowding of the bronchovascular markings.  Allowing for this, no focal pulmonary opacity is seen.  IVC filter in place.  No free air beneath the diaphragms or on the decubitus projection.  Splenic arterial calcification noted without definite calcified aneurysm.  Normal bowel gas pattern.  No acute osseous abnormality.  IMPRESSION: Normal bowel gas pattern.  Low lung volumes. If the patient's symptoms continue, consider PA and lateral chest radiographs obtained at full inspiration when the patient is clinically able.  Original Report Authenticated By: Harrel Lemon, M.D.    Medications: I have reviewed the patient's current medications. Scheduled Meds:   . cefOXitin  1 g Intravenous Q6H  . digoxin  0.125 mg Intravenous Daily  . furosemide  20 mg Intravenous BID  . insulin aspart  0-15 Units Subcutaneous TID WC  . insulin regular  0-10 Units Intravenous TID WC  . metoprolol tartrate  12.5 mg Oral BID  . sodium chloride  300 mL Intravenous Once  . sodium chloride  500 mL Intravenous Once  . sodium chloride  500 mL Intravenous Once  . DISCONTD: sodium chloride   Intravenous Once  . DISCONTD: sodium chloride   Intravenous Once  . DISCONTD: fluticasone  2 spray Each Nare QHS  . DISCONTD: furosemide  20 mg Intravenous Once  . DISCONTD: lisinopril  2.5 mg Oral Daily  . DISCONTD: metoprolol  5 mg Intravenous Q8H  . DISCONTD: metoprolol succinate  12.5 mg Oral Daily  . DISCONTD: sodium chloride  3 mL Intravenous Q12H   Continuous Infusions:   . 0.45 % NaCl with KCl 20 mEq / L 75 mL/hr (07/13/11 0107)   . dextrose 5 % and 0.45% NaCl 20 mL/hr (07/13/11 0111)  . insulin (NOVOLIN-R) infusion 5.1 Units/hr (07/13/11 0412)  . DISCONTD: sodium chloride    . DISCONTD: lactated ringers Stopped (07/12/11 2130)   PRN Meds:.dextrose, morphine, ondansetron (ZOFRAN) IV, ondansetron, technetium labeled red blood cells, DISCONTD: fentaNYL, DISCONTD: HYDROmorphone, DISCONTD: meperidine, DISCONTD: midazolam, DISCONTD: promethazine, DISCONTD: sodium chloride, DISCONTD: sodium chloride, DISCONTD: sodium chloride irrigation Assessment/Plan:  Lower GI Bleed (07/12/2011)   Assessment: Pt s/p Left colectomy with colostomy.   Plan: Per surgery  Patient Active Hospital Problem List: Thrombocytopenia (06/25/2011)   Assessment: Pt has received 4 units of platelets and they  are stable at 117   Plan: continue to monitor  Diabetes mellitus (06/20/2011)   Assessment: Pt on glucose stabilizer.    Plan: will keep on stabilizer for now until pt tolerating diet and can more easily titrate insulin.  CKD (chronic kidney disease), stage III (06/25/2011)   Assessment: Renal function stable. Continue to monitor   Chronic systolic heart failure (07/12/2011)   Assessment: Pt has received sisgnificant amounts of fluid and we will monitor I&O's closely as well as daily weights. However the last record of LVF was in 2008 so I will obtain a 2-D Echo to evaluate LVF. Agree with low dose ACE and resumption of B-blocker.   Diverticulosis of colon with hemorrhage (07/12/2011)   Assessment: See above.    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 Pt has rec'd 6 units PRBC's. Target Hb >10  DVT prophylaxis with SCD's  GI prophylaxis: will resume PO pepcid.  Updated Daughter at bedside.   Total time: 45 minutes   LOS: 1 day

## 2011-07-13 NOTE — Progress Notes (Signed)
[facility] hospitalist progress note. Chief complaint. Active bleeding. History of present illness. This 78 year old female post colectomy for bleeding diverticuli. She is postop now by about 10 hours. I saw her initially after her arrival from PACU to ICU/step down. She had a surgical drain in her abdomen with about 100 cc of bright red blood. I was called back to the bedside by the staff to his there has been bleeding noted through the patient's abdominal dressing and continued bloody discharge to her surgical drain. The patient does remain stable in regards to her vital signs and level of consciousness. She had a degree of thrombocytopenia prior surgery as well as anemia. She was transfused platelets, plasma, and packed red blood cells over the course of surgery. Her hemoglobin was checked postoperatively and was 8.3. I did recheck a CBC and this has resulted showing WBC height 4.4, hemoglobin low 7.6, hematocrit low 22.9 and platelets low at 117. Vital signs. Temperature 97.5, pulse 90, respirations 16, blood pressure 103/39. O2 sats 97%. General appearance. A frail elderly female in no distress. Alert and cooperative. Cardiac. Rate and rhythm regular. Lungs. Clear but reduced. Abdomen. Mid abdominal surgical dressing shows old bleeding that has soaked through. Staff is reinforced this with 4 x 4's which are also bloody. There is 100 150 cc of bloody discharge in the patient surgical drain bag. Impression/plan. Problem #1 acute bleeding. This in light of anemia and thrombocytopenia. I will transfuse 2 units of platelets. Will transfuse him one unit of packed red blood cells. Will repeat a hemoglobin post transfusion at 10:00 a.m. I've asked the staff to update surgery to see if they have any orders or suggestions as well. Patient currently is hemodynamically stable.

## 2011-07-13 NOTE — Progress Notes (Signed)
Subjective:  Patient denies any chest pain or abdominal pain Patient underwent exploratory laparotomy and colectomy and colostomy tolerated procedure well  Objective:  Vital Signs in the last 24 hours: Temp:  [96.5 F (35.8 C)-97.6 F (36.4 C)] 97.1 F (36.2 C) (02/25 1600) Pulse Rate:  [79-101] 98  (02/25 2000) Resp:  [3-20] 17  (02/25 2000) BP: (100-142)/(37-61) 126/58 mmHg (02/25 2000) SpO2:  [94 %-100 %] 95 % (02/25 2000)  Intake/Output from previous day: 02/24 0701 - 02/25 0700 In: 1610.9 [I.V.:5873.4; UEAVW:0981; IV Piggyback:170] Out: 1465 [Urine:850; Stool:565; Blood:50] Intake/Output from this shift: Total I/O In: 95 [I.V.:95] Out: 40 [Urine:40]  Physical Exam: Neck: no adenopathy, no carotid bruit, no JVD and supple, symmetrical, trachea midline Lungs: Decreased breath sounds at bases with bibasilar Rales Heart: regular rate and rhythm, S1, S2 normal and Soft systolic murmur and S3 gallop noted Abdomen: Soft bowel sounds absent colostomy bag suurgical  dresssing drry Extremities: extremities normal, atraumatic, no cyanosis or edema  Lab Results:  Basename 07/13/11 0950 07/13/11 0300 07/12/11 2136  WBC -- 12.4* 7.8  HGB 10.5* 7.6* --  PLT -- 117* 107*    Basename 07/13/11 0300 07/12/11 2136  NA 143 142  K 5.2* 4.7  CL 115* 113*  CO2 23 23  GLUCOSE 215* 233*  BUN 49* 53*  CREATININE 1.39* 1.37*    Basename 07/12/11 1700 07/12/11 0620  TROPONINI <0.30 <0.30   Hepatic Function Panel  Basename 07/13/11 0300  PROT 3.9*  ALBUMIN 1.8*  AST 24  ALT 55*  ALKPHOS 72  BILITOT 0.9  BILIDIR --  IBILI --   No results found for this basename: CHOL in the last 72 hours No results found for this basename: PROTIME in the last 72 hours  Imaging: Imaging results have been reviewed and Dg Chest 1 View  07/13/2011  *RADIOLOGY REPORT*  Clinical Data: Central line placement.  CHEST - 1 VIEW  Comparison: Chest radiograph performed 06/29/2011  Findings: The  patient's right IJ line is noted ending about the cavoatrial junction.  The lungs remain hypoexpanded.  Previously noted diffuse bilateral airspace opacification has improved, with mild residual vascular congestion and vascular crowding noted, but no significant pulmonary edema now seen.  No pleural effusion or pneumothorax is identified.  The cardiomediastinal silhouette remains mildly enlarged. Calcification is noted within the thoracic aorta.  No acute osseous abnormalities are seen.  IMPRESSION:  1.  Right IJ line noted ending about the cavoatrial junction. 2.  Lungs remain hypoexpanded; diffuse bilateral airspace opacification has significantly improved, with mild residual vascular congestion and mild cardiomegaly seen.  No significant pulmonary edema now present.  Original Report Authenticated By: Tonia Ghent, M.D.   Nm Gi Blood Loss  07/12/2011  *RADIOLOGY REPORT*  Clinical Data: 78 year old female with GI bleed.  NUCLEAR MEDICINE GASTROINTESTINAL BLEEDING STUDY  Technique:  Sequential abdominal images were obtained following intravenous administration of Tc-60m labeled red blood cells.  Radiopharmaceutical: 23.8MILLI CURIE ULTRATAG TECHNETIUM TC 60M- LABELED RED BLOOD CELLS IV KIT  Comparison: None  Findings: Activity identified within the left colon is noted, compatible with GI bleed.  The source of this GI bleed likely originates in the descending colon. No other abnormalities are present.  IMPRESSION: Active GI bleeding from the left/descending colon.  Original Report Authenticated By: Rosendo Gros, M.D.    Cardiac Studies:  Assessment/Plan:  Status post lower GI bleed status post exploratory laparotomy and colectomy and colostomy postop day 1 Coronary artery disease history of MI in  the past Ischemic cardiomyopathy Mild systolic heart failure secondary to volume overload Valvular heart disease Hypertension Diabetes mellitus History of CVA History of bilateral PE in the past History  of DVT Chronic kidney disease COPD Remote tobacco abuse Thrombocytopenia Plan Add digoxin per orders Continue rest of meds  LOS: 1 day    [name] N 07/13/2011, 8:55 PM

## 2011-07-13 NOTE — Progress Notes (Signed)
Inpatient Diabetes Program Recommendations  AACE/ADA: New Consensus Statement on Inpatient Glycemic Control (2009)  Target Ranges:  Prepandial:   less than 140 mg/dL      Peak postprandial:   less than 180 mg/dL (1-2 hours)      Critically ill patients:  140 - 180 mg/dL   Reason for Visit: Hyperglycemia  Results for [name], [name] (MRN [id number]) as of 07/13/2011 13:11  Ref. Range 07/12/2011 21:36 07/13/2011 03:00  Glucose Latest Range: 70-99 mg/dL 244 (H) 010 (H)  Results for [name], [name] (MRN [id number]) as of 07/13/2011 13:11  Ref. Range 06/20/2011 22:50  Hemoglobin A1C Latest Range: <5.7 % 9.3 (H)  Results for [name], [name] (MRN [id number]) as of 07/13/2011 13:11  Ref. Range 07/13/2011 03:06 07/13/2011 04:09 07/13/2011 05:13 07/13/2011 06:05 07/13/2011 07:20 07/13/2011 08:46 07/13/2011 09:53 07/13/2011 11:01 07/13/2011 12:03  Glucose-Capillary Latest Range: 70-99 mg/dL 638 (H) 756 (H) 90 433 (H) 194 (H) 175 (H) 115 (H) 116 (H) 113 (H)    Inpatient Diabetes Program Recommendations Insulin - Basal: Continue on glucostabilizer until criteria met for transitioning to SQ insulin.  Pt on 50/50 insulin at home.  Note: Home dose:  50/50 80 units in am, 55 units in pm

## 2011-07-13 NOTE — Progress Notes (Signed)
  Echocardiogram 2D Echocardiogram has been performed.  Cathie Beams Deneen 07/13/2011, 10:50 AM

## 2011-07-13 NOTE — Op Note (Signed)
NAMEMAKELL, DROHAN NO.:  [id number]  MEDICAL RECORD NO.:  [id number]  LOCATION:  1224                         FACILITY:  [facility]  PHYSICIAN:  Sandria Bales. Ezzard Standing, M.D.  DATE OF BIRTH:  February 21, 1926  DATE OF PROCEDURE:  07/12/2011                              OPERATIVE REPORT  PREOPERATIVE DIAGNOSIS:  Lower gastrointestinal bleeding at the junction of the left colon and sigmoid colon.  POSTOPERATIVE DIAGNOSIS:  Lower gastrointestinal bleeding at the junction of the left colon and sigmoid colon secondary to diverticular disease.  PROCEDURES:  Extended left/sigmoid colectomy, mobilization of splenic flexure, Hartmann pouch, and end left transverse colon colostomy.  SURGEON:  Sandria Bales. Ezzard Standing, M.D.  FIRST ASSISTANT:  Lodema Pilot, MD.  ANESTHESIA:  General endotracheal.  ESTIMATED BLOOD LOSS:  About 100 cc.  DRAINS:  Telfa wicks in the wound.  INDICATION FOR PROCEDURE:  Ms. Marcy is an 78 year old African American female who presented with acute lower GI bleed to [facility] today.  She has significant medical issues including a thrombocytopenia whose etiology is unclear.  She sees Dr. Margaretmary Bayley as her primary care doctor.  Dr. Orpah Cobb from Cardiology standpoint.  A sigmoidoscopy done by Dr. Jeani Hawking showed diffuse sigmoid colon diverticulosis, but he did not identify a bleeding source.  She had a blood pool scan, which showed bleeding which appears to be at the junction of the left colon and sigmoid colon.  I spoke to the family that I am proceeding with a colectomy for resection of this bleeding source.  The indications and risk of the surgery were discussed with the family.  OPERATIVE NOTE:  The patient placed in a supine position, given a general endotracheal anesthetic.  She was given 1 g of cefoxitin at the initiation of procedure.  PAS stockings were placed lithotomy.  She had a right internal jugular vein accessed with a triple-lumen  by Dr. Acey Lav.  The time-out was held and the surgical checklist run.    Her abdomen and perineum were prepped with ChloraPrep and Betadine.  She was sterilely Draped.  I went through a midline abdominal incision and did abdominal Exploration. The right and left lobes of the liver were unremarkable.  The gallbladder that I could see, was unremarkable.  Stomach had NG tube in proper position.  The small bowel was run.  There was no obvious mass or tumor or source of bleeding.  She has significant left colon and sigmoid colon diverticulosis, and this appeared to be the source of bleeding. She did have some scattered diverticula of her transverse colon, so she has a pan colonic diverticulosis, though the most significant disease was by far involving the sigmoid colon and left colon.  Because of her age, because of her medical condition, I really felt That it was not probably wise to try to hook it together, and I felt she would be best served with extended left hemicolectomy, Hartmann pouch and end-colostomy.  So, I identified the distal sigmoid colon down above the rectum.  I went to about 5 to 6 cm above the peritoneal reflection, found there no diverticula in the distal sigmoid colon.  I used a 75  mm GIA stapler across this, and divided the distal bowel. I placed 2-0 prolene sutures in the distal stump for possible later identification. I then used the LigaSure as the primary energy source to divide the mesentery, and took the mesentery of the sigmoid colon.    I identified the left ureter which was well posterior to our dissection.  I mobilized the left colon and splenic flexure.  I took the mesentery down again with the LigaSure.  I got to the left transverse colon, where I had taken out all the significant diverticula of the left colon and sigmoid colon.  There were 1 or 2 more diverticula proximal to this, so I took another small segment.  I put a long suture in the colon  proximally and a short suture in the colon distally to kind of match as being my proximal end of the bowel.  There was no obvious tumor or growths that I could palpate on the specimen.  I re-examined the left colonic gutter, the mesentery.  I irrigated with 3 L of saline.  There was no bleeding. The pelvis was clean.  the bowel looked good.  Again, even though she looked like a candidate for possible re-anastomosis, I thought she would be best served with her age, her significant comorbid problems to bring out a colostomy, and then deal with reversing this ostomy at a later day when she was more stable in better condition.  Therefore, I developed the colostomy wound site in the left upper quadrant.  I brought the bowel through this, tacked the bowel on the fascial side with 3-0 Vicryl sutures and tacked with 3-0 Vicryl sutures for the fascia where I made the ostomy.  I brought the omentum down in the midline.  I closed the midline incision with 2 running #1 PDS sutures with some interrupted #1 Novafil sutures.  I irrigated the wound, closed the skin with staples, I did place Telfa wicks to the wound.  I then matured the colostomy with interrupted 3-0 Vicryl sutures. The viable ostomy about 2 inches in diameter.  I placed an ostomy bag on this, and then sterilely dressed it.  They were trying to extubate her.  She already has a unit bed.  Her estimated blood loss was 100-150 cc at the most.  She was given some FFP at the beginning of the case, and we gave her another unit of platelets at the end of the case.  Sponge and needle count were correct at the end of the case.  She was transferred to the RR in stable condition.   Sandria Bales. Ezzard Standing, M.D., FACS   DHN/MEDQ  D:  07/12/2011  T:  07/13/2011  Job:  [id number]  cc:   Margaretmary Bayley, M.D. Fax: [phone]  Ricki Rodriguez, M.D. Fax: [phone]  Altha Harm, MD  Jordan Hawks Elnoria Howard, MD Fax: [phone]

## 2011-07-13 NOTE — Progress Notes (Signed)
Pt's grandson on leave from the [facility] due to Pt's surgery.  Grandson requesting letter stating his absence from the [facility] is due to Pt's medical condition.  Typed letter for Pt's grandson, Fransisca Connors, and left it with his mom in Pt's room  CSW to sign off.  Providence Crosby, LCSWA Clinical Social Work [phone]

## 2011-07-13 NOTE — Consult Note (Signed)
PCCM progress note.   [name] is an 78 y.o. female. Admitted to the ICU on 2/24 for  management s/p hemicolectomy for GIB on 2/24.  Referring Physician: CCS  Lines R IJ 2/24>>>  Culture data/sepsis markers MRSA Screen 2/24 > neg  Antibiotics  Studies  2/2 CT abd/pelvis w/contrast: cardiomegaly, bibasilar airspace dz. Mild diffuse bladder wall thickening. Severe sigmoid diverticulosis  2/2 VQ scan:neg 2/4 TTE EF 35-40%, mild dd, severe MR 2/24 admitted for BRBPR. GI consulted --> EGD/sigmoidoscopy could not identify source --> tagged RBC study positive for bleed at junction of L colon/sigmoid. Cardiology consulted for preop eval. Pt underwent ex-lap & Dr. Ezzard Standing performed extended left/sigmoid colectomy, Hartmann's pouch, and transverse colostomy   Subjective Nad RA with sat 96%  objective BP 100/57  Pulse 91  Temp(Src) 97 F (36.1 C) (Axillary)  Resp 16  Ht 5\' 3"  (1.6 m)  Wt 141 lb 12.1 oz (64.3 kg)  BMI 25.11 kg/m2  SpO2 96%     . 0.45 % NaCl with KCl 20 mEq / L 75 mL/hr (07/13/11 0930)  . dextrose 5 % and 0.45% NaCl 20 mL/hr (07/13/11 0111)  . insulin (NOVOLIN-R) infusion 1.1 Units/hr (07/13/11 0955)  . DISCONTD: sodium chloride    . DISCONTD: lactated ringers Stopped (07/12/11 2130)  CVP:  [6 mmHg-10 mmHg] 8 mmHg  Intake/Output Summary (Last 24 hours) at 07/13/11 1042 Last data filed at 07/13/11 1000  Gross per 24 hour  Intake 8172.9 ml  Output   1510 ml  Net 6662.9 ml   Physical Exam    Head: Normocephalic.  Eyes: No scleral icterus.  Neck: Normal range of motion.  Cardiovascular: Normal rate and regular rhythm.  Exam reveals no gallop and no friction rub.  Loud iv/vi systolic murmur.  Respiratory: Effort normal. No respiratory distress. She has no wheezes. She has no rales.  GI: Soft. She exhibits no distension. There is no tenderness.  Colostomy bag with dark red blood inside  Musculoskeletal: Normal range of motion. She exhibits no edema.    Neurological: She is alert.  Skin: Skin is warm and dry. She is not diaphoretic.    Assessment/Plan: 9 yoaaf s/p hemicolectomy for diverticular bleed  LGIB 2/2 diverticulosis now s/p  hartman's pouch formation, diverting colostomy on 2/24: got 2 units PRBC on 2/25  post-op with approp response   Lab 07/13/11 0950 07/13/11 0300 07/13/11 0125  HGB 10.5* 7.6* 8.3*  Plan: -advance diet per gen surg recs.  -trend CBC -transfusion trigger for < 7 unless hemodynamically unstable.   Thrombocytopenia  Lab 07/13/11 0300 07/12/11 2136 07/12/11 1700  PLT 117* 107* 113*  plan: -stable. Don't think further PLTs warranted. Will defer to surgery.   Acute renal failure Recent Labs  [facility] 07/13/11 0300 07/12/11 2136 07/12/11 0620   CREATININE 1.39* 1.37* 1.67*  plan -renal adjust meds -trend chemistry    DM CBG (last 3)   Basename 07/13/11 0720 07/13/11 0605 07/13/11 0513  GLUCAP 194* 140* 90  plan: -cont hyperglycemia protocol  Chronic systolic HF with low EF & severe MR Plan: -continue digoxin -continue lasix 20mg  IV BID; will eventually need to restart home dose lasix 40mg  PO qd -Echo 2/25 >>  -would hold off on ace-I given renal dysfxn and early post-op  H/o aspiration/dysphagia:  Per daughter, pt has been having a difficult time with swallowing Plan: -pt will need swallow evaluation when surgery okay with pt attempting feeding  Dispo Has excellent IM care currently following. OK  to transition to SDU status. PCCM will be available on PRN basis.   BABCOCK,PETE 07/13/2011, 10:36 AM   Pt independently  seen and examined and available cxr's reviewed and I agree with above findings/ imp/ plan and discussed with Dr Ashley Royalty from [facility] as well  Sandrea Hughs, MD Pulmonary and Critical Care Medicine [facility] Healthcare Cell [phone]

## 2011-07-13 NOTE — Progress Notes (Signed)
eLink Physician-Brief Progress Note Patient Name: [name] DOB: 21-Jul-1925 MRN: [id number]  Date of Service  07/13/2011   HPI/Events of Note  Multiple IVFs s/p surgery   eICU Interventions  Adjustments made in IVFs to reduce ongoing volume administration and reduction in glucose load.   Intervention Category Minor Interventions: Routine modifications to care plan (e.g. PRN medications for pain, fever)  Ceazia Harb 07/13/2011, 1:06 AM

## 2011-07-13 NOTE — Consult Note (Signed)
WOC ostomy consult  Stoma type/location: left lower quadrant colostomy Stomal assessment/size: 1 and 3/8 inch round, budded stoma, pink, viable visualized through pouch Peristomal assessment: No visualized. Treatment options for stomal/peristomal skin: None noted at this time; no gas or stool in pouch, pouch intact, not changed. Output: scant amount blood tinged drainaged in pouch. No flatus. Ostomy pouching: 1pc. Karaya pouch [phone] (Lawson#: [phone]) Education provided: Patient arouses, but is drowsy.  Family members present for introduction to ostomy nurse.  Understand role and function of ostomy nurse in the post operative course. Will follow with you. Thanks, Ladona Mow, MSN, RN, [facility], CWOCN [phone])

## 2011-07-14 ENCOUNTER — Inpatient Hospital Stay (HOSPITAL_COMMUNITY): Payer: PRIVATE HEALTH INSURANCE

## 2011-07-14 LAB — CBC
HCT: 27.6 % — ABNORMAL LOW (ref 36.0–46.0)
Hemoglobin: 9.3 g/dL — ABNORMAL LOW (ref 12.0–15.0)
MCH: 30.2 pg (ref 26.0–34.0)
MCV: 89.6 fL (ref 78.0–100.0)
Platelets: 90 10*3/uL — ABNORMAL LOW (ref 150–400)
RBC: 3.08 MIL/uL — ABNORMAL LOW (ref 3.87–5.11)

## 2011-07-14 LAB — GLUCOSE, CAPILLARY
Glucose-Capillary: 115 mg/dL — ABNORMAL HIGH (ref 70–99)
Glucose-Capillary: 189 mg/dL — ABNORMAL HIGH (ref 70–99)
Glucose-Capillary: 197 mg/dL — ABNORMAL HIGH (ref 70–99)
Glucose-Capillary: 214 mg/dL — ABNORMAL HIGH (ref 70–99)
Glucose-Capillary: 87 mg/dL (ref 70–99)

## 2011-07-14 LAB — COMPREHENSIVE METABOLIC PANEL
AST: 33 U/L (ref 0–37)
Albumin: 1.9 g/dL — ABNORMAL LOW (ref 3.5–5.2)
Chloride: 114 mEq/L — ABNORMAL HIGH (ref 96–112)
Creatinine, Ser: 1.32 mg/dL — ABNORMAL HIGH (ref 0.50–1.10)
Total Bilirubin: 1.1 mg/dL (ref 0.3–1.2)
Total Protein: 4.5 g/dL — ABNORMAL LOW (ref 6.0–8.3)

## 2011-07-14 LAB — DIFFERENTIAL
Eosinophils Absolute: 0.5 10*3/uL (ref 0.0–0.7)
Eosinophils Relative: 3 % (ref 0–5)
Lymphs Abs: 2.2 10*3/uL (ref 0.7–4.0)
Monocytes Absolute: 1.7 10*3/uL — ABNORMAL HIGH (ref 0.1–1.0)
Monocytes Relative: 12 % (ref 3–12)

## 2011-07-14 MED ORDER — ALPRAZOLAM 0.25 MG PO TABS
0.2500 mg | ORAL_TABLET | Freq: Every day | ORAL | Status: DC
  Administered 2011-07-14 – 2011-07-19 (×6): 0.25 mg via ORAL
  Filled 2011-07-14 (×6): qty 1

## 2011-07-14 MED ORDER — FUROSEMIDE 10 MG/ML IJ SOLN
20.0000 mg | Freq: Once | INTRAMUSCULAR | Status: AC
  Administered 2011-07-14: 20 mg via INTRAVENOUS
  Filled 2011-07-14: qty 2

## 2011-07-14 NOTE — Progress Notes (Signed)
"  feel bad"; sat in chair 2 hrs. Some burping.  abd soft, nd, expected TTP. Ostomy-edematous, pink, viable. Some dark thin blood in bag.  PT/OT OOB, pulm toilet Sips/chips Monitor hgb-hold chemical vte prophylaxis for now.  [name]. Andrey Campanile, MD, FACS General, Bariatric, & Minimally Invasive Surgery [facility] Surgery, [place]

## 2011-07-14 NOTE — Progress Notes (Signed)
Subjective:  Patient denies any chest pain or shortness of breath Denies any abdominal pain  Objective:  Vital Signs in the last 24 hours: Temp:  [98 F (36.7 C)-100 F (37.8 C)] 98.2 F (36.8 C) (02/26 2000) Pulse Rate:  [92-112] 106  (02/26 2000) Resp:  [17-22] 19  (02/26 2000) BP: (115-136)/(47-57) 128/57 mmHg (02/26 2000) SpO2:  [91 %-96 %] 96 % (02/26 2000)  Intake/Output from previous day: 02/25 0701 - 02/26 0700 In: 2342 [I.V.:2090; Blood:200; IV Piggyback:52] Out: 770 [Urine:770] Intake/Output from this shift: Total I/O In: 20 [I.V.:20] Out: -   Physical Exam: Neck: no adenopathy, no carotid bruit, no JVD and supple, symmetrical, trachea midline Lungs: Decreased breath sounds at bases with occasional rares Heart: regular rate and rhythm, S1, S2 normal and Soft systolic murmur and S3 gallop noted Abdomen: Soft bowel sounds are absent  surgical dressing dry Extremities: extremities normal, atraumatic, no cyanosis or edema  Lab Results:  Basename 07/14/11 0804 07/13/11 0950 07/13/11 0300  WBC 13.9* -- 12.4*  HGB 9.3* 10.5* --  PLT 90* -- 117*    Basename 07/14/11 0804 07/13/11 0300  NA 142 143  K 5.2* 5.2*  CL 114* 115*  CO2 22 23  GLUCOSE 96 215*  BUN 37* 49*  CREATININE 1.32* 1.39*    Basename 07/12/11 1700 07/12/11 0620  TROPONINI <0.30 <0.30   Hepatic Function Panel  Basename 07/14/11 0804  PROT 4.5*  ALBUMIN 1.9*  AST 33  ALT 52*  ALKPHOS 75  BILITOT 1.1  BILIDIR --  IBILI --   No results found for this basename: CHOL in the last 72 hours No results found for this basename: PROTIME in the last 72 hours  Imaging: Imaging results have been reviewed and Dg Chest 1 View  07/13/2011  *RADIOLOGY REPORT*  Clinical Data: Central line placement.  CHEST - 1 VIEW  Comparison: Chest radiograph performed 06/29/2011  Findings: The patient's right IJ line is noted ending about the cavoatrial junction.  The lungs remain hypoexpanded.  Previously noted  diffuse bilateral airspace opacification has improved, with mild residual vascular congestion and vascular crowding noted, but no significant pulmonary edema now seen.  No pleural effusion or pneumothorax is identified.  The cardiomediastinal silhouette remains mildly enlarged. Calcification is noted within the thoracic aorta.  No acute osseous abnormalities are seen.  IMPRESSION:  1.  Right IJ line noted ending about the cavoatrial junction. 2.  Lungs remain hypoexpanded; diffuse bilateral airspace opacification has significantly improved, with mild residual vascular congestion and mild cardiomegaly seen.  No significant pulmonary edema now present.  Original Report Authenticated By: Tonia Ghent, M.D.   Dg Chest Port 1 View  07/14/2011  *RADIOLOGY REPORT*  Clinical Data: Postsurgical hypoxemia  PORTABLE CHEST - 1 VIEW  Comparison: 07/12/2011  Findings: Cardiomegaly again noted.  Central mild vascular congestion and mild perihilar interstitial prominence without convincing pulmonary edema.  Minimal basilar atelectasis without focal infiltration.  Stable right IJ central line position.  IMPRESSION: Central mild vascular congestion and mild perihilar interstitial prominence without convincing pulmonary edema.  Minimal basilar atelectasis without focal infiltration.  Stable right IJ central line position.  Original Report Authenticated By: Natasha Mead, M.D.    Cardiac Studies:  Assessment/Plan:  Status post left hemicolectomy postop day 2 doing well Status post lower GI bleeding Postop ileus Postop anemia Mild systolic heart failure  valvular heart disease Hypertension Diabetes mellitus History of CVA History of bilateral PE and DVT in past Chronic kidney disease COPD Remote  tobacco abuse Thrombocytopenia Plan Continue present management per surgery  LOS: 2 days    Shifa Brisbon N 07/14/2011, 8:42 PM

## 2011-07-14 NOTE — Progress Notes (Signed)
Subjective: Pt doing well today. She has been up in the chair and is conversing with her family. Still no flatus. Objective: Filed Vitals:   07/14/11 1200 07/14/11 1600 07/14/11 1800 07/14/11 2000  BP: 129/56 136/47 123/51 128/57  Pulse: 104 104 104 106  Temp: 100 F (37.8 C) 99.9 F (37.7 C)  98.2 F (36.8 C)  TempSrc: Oral Axillary  Oral  Resp: 19 21 21 19   Height:      Weight:      SpO2: 93% 95% 96% 96%   Weight change:   Intake/Output Summary (Last 24 hours) at 07/14/11 2016 Last data filed at 07/14/11 2000  Gross per 24 hour  Intake   1415 ml  Output   1310 ml  Net    105 ml    General: Alert, awake, oriented x3, in no acute distress.  HEENT: [place]/AT PEERL, EOMI (Pt legally blind) Neck: Trachea midline,  no masses, no thyromegal,y no JVD, no carotid bruit OROPHARYNX:  Moist, No exudate/ erythema/lesions.  Heart: Regular rate and rhythm, without murmurs, rubs, gallops, Sinus tachycardia on telemetry. Lungs: Clear to auscultation, no wheezing or rhonchi noted but decreased air entry in bases. No increased vocal fremitus resonant to percussion  Abdomen: Soft, nontender, nondistended, positive bowel sounds..  Neuro: No focal neurological deficits noted cranial nerves II through XII grossly intact. DTRs 2+ bilaterally upper and lower extremities. Strength: moving bilateral upper and lower extremities. Musculoskeletal:extremities cool. Psychiatric: Patient alert and oriented x3.      Lab Results:  Basename 07/14/11 0804 07/13/11 0300  NA 142 143  K 5.2* 5.2*  CL 114* 115*  CO2 22 23  GLUCOSE 96 215*  BUN 37* 49*  CREATININE 1.32* 1.39*  CALCIUM 7.3* 6.4*  MG -- --  PHOS -- --    Basename 07/14/11 0804 07/13/11 0300  AST 33 24  ALT 52* 55*  ALKPHOS 75 72  BILITOT 1.1 0.9  PROT 4.5* 3.9*  ALBUMIN 1.9* 1.8*   No results found for this basename: LIPASE:2,AMYLASE:2 in the last 72 hours  Basename 07/14/11 0804 07/13/11 0950 07/13/11 0300 07/12/11 2136  WBC  13.9* -- 12.4* --  NEUTROABS 9.5* -- -- 6.7  HGB 9.3* 10.5* -- --  HCT 27.6* 30.3* -- --  MCV 89.6 -- 90.5 --  PLT 90* -- 117* --    Basename 07/12/11 1700 07/12/11 0620  CKTOTAL 106 --  CKMB 4.6* --  CKMBINDEX -- --  TROPONINI <0.30 <0.30   No components found with this basename: POCBNP:3 No results found for this basename: DDIMER:2 in the last 72 hours No results found for this basename: HGBA1C:2 in the last 72 hours No results found for this basename: CHOL:2,HDL:2,LDLCALC:2,TRIG:2,CHOLHDL:2,LDLDIRECT:2 in the last 72 hours No results found for this basename: TSH,T4TOTAL,FREET3,T3FREE,THYROIDAB in the last 72 hours  Basename 07/12/11 0630  VITAMINB12 --  FOLATE --  FERRITIN 331*  TIBC 279  IRON 32*  RETICCTPCT 3.2*    Micro Results: Recent Results (from the past 240 hour(s))  MRSA PCR SCREENING     Status: Normal   Collection Time   07/12/11 10:50 AM      Component Value Range Status Comment   MRSA by PCR NEGATIVE  NEGATIVE  Final     Studies/Results: Dg Chest 1 View  07/13/2011  *RADIOLOGY REPORT*  Clinical Data: Central line placement.  CHEST - 1 VIEW  Comparison: Chest radiograph performed 06/29/2011  Findings: The patient's right IJ line is noted ending about the cavoatrial junction.  The lungs remain hypoexpanded.  Previously noted diffuse bilateral airspace opacification has improved, with mild residual vascular congestion and vascular crowding noted, but no significant pulmonary edema now seen.  No pleural effusion or pneumothorax is identified.  The cardiomediastinal silhouette remains mildly enlarged. Calcification is noted within the thoracic aorta.  No acute osseous abnormalities are seen.  IMPRESSION:  1.  Right IJ line noted ending about the cavoatrial junction. 2.  Lungs remain hypoexpanded; diffuse bilateral airspace opacification has significantly improved, with mild residual vascular congestion and mild cardiomegaly seen.  No significant pulmonary edema now  present.  Original Report Authenticated By: Tonia Ghent, M.D.   Dg Chest 2 View  06/29/2011  *RADIOLOGY REPORT*  Clinical Data: Follow up pneumonia  CHEST - 2 VIEW  Comparison: 06/28/2011  Findings: Heart size is normal.  The lung volumes are low.  Diffuse bilateral lung opacities have increased from previous exam.  There is worsening aeration to the entire right lung.  IMPRESSION:  1.  Interval progression of  pulmonary edema and/or multifocal pneumonia.  Original Report Authenticated By: Rosealee Albee, M.D.   Dg Lumbar Spine Complete  07/07/2011  *RADIOLOGY REPORT*  Clinical Data: Pain after fall  LUMBAR SPINE - COMPLETE 4+ VIEW  Comparison:July 09, 2010  Findings:  An IVC filter is stable in position at the L2 level. Significant disc space narrowing at L4-L5 with anterior osteophytosis remains present and unchanged.  There is also osteophytosis and disc space narrowing at the lower thoracic spine, unchanged.  Facet joint sclerosis from L3-S1 also remains present. No pars interarticularis defects are identified.  There is mild left convex lumbar scoliosis.  The lateral alignment is within normal limits. The vertebral body heights are maintained.  IMPRESSION: No significant change in appearance of degenerative disc disease at L4-L5 and facet joint sclerosis from L3-S1.  If there is clinical concern regarding herniated disc, MRI may be of help.  Original Report Authenticated By: Brandon Melnick, M.D.   Dg Pelvis 1-2 Views  07/07/2011  *RADIOLOGY REPORT*  Clinical Data: Pain after fall  PELVIS - 1-2 VIEW  Comparison: None.  Findings: There is no evidence of hip or pelvic fracture or dislocation.  The hip joint compartments are maintained and symmetric.  The femoral heads are well rounded.  IMPRESSION: There is no evidence of acute abnormality.  Original Report Authenticated By: Brandon Melnick, M.D.   Nm Gi Blood Loss  07/12/2011  *RADIOLOGY REPORT*  Clinical Data: 78 year old female with GI bleed.   NUCLEAR MEDICINE GASTROINTESTINAL BLEEDING STUDY  Technique:  Sequential abdominal images were obtained following intravenous administration of Tc-72m labeled red blood cells.  Radiopharmaceutical: 23.8MILLI CURIE ULTRATAG TECHNETIUM TC 44M- LABELED RED BLOOD CELLS IV KIT  Comparison: None  Findings: Activity identified within the left colon is noted, compatible with GI bleed.  The source of this GI bleed likely originates in the descending colon. No other abnormalities are present.  IMPRESSION: Active GI bleeding from the left/descending colon.  Original Report Authenticated By: Rosendo Gros, M.D.   Ct Abdomen Pelvis W Contrast  06/20/2011  *RADIOLOGY REPORT*  Clinical Data: Lower abdominal and pelvic pain and pressure. Leukocytosis.  CT ABDOMEN AND PELVIS WITH CONTRAST  Technique:  Multidetector CT imaging of the abdomen and pelvis was performed following the standard protocol during bolus administration of intravenous contrast.  Contrast: OMNIPAQUE IOHEXOL 300 MG/ML IV SOLN  Comparison: 08/13/2004  Findings: Increased cardiomegaly is seen since previous study. Diffuse bibasilar air space opacity is  seen as well as a tiny right pleural effusion.  These findings are consistent with congestive heart failure.  The liver, gallbladder, pancreas, spleen, and adrenal glands are normal in appearance.  IVC filter is seen in place.  Renal cysts and parenchymal scarring again seen bilaterally.  Right extrarenal pelvis noted, without evidence of caliectasis or ureterectasis. Mild diffuse bladder wall thickening and mucosal enhancement is again noted, which may be seen with neurogenic bladder, cystitis, or chronic bladder outlet obstruction.  Previous hysterectomy noted.  Severe diverticulosis seen involving the descending and sigmoid colon, however there is no evidence of diverticulitis.  No other inflammatory process or abnormal fluid collections are identified. No soft tissue masses or lymphadenopathy identified  within the abdomen or pelvis.  IMPRESSION:  1.  Increased cardiomegaly, bibasilar airspace disease, and tiny right pleural effusion, consistent with congestive heart failure. 2.  No acute findings within the abdomen or pelvis. 3.  Mild diffuse bladder wall thickening and enhancement. Differential diagnosis includes neurogenic bladder, cystitis, or chronic bladder outlet obstruction. 3.  Severe sigmoid diverticulosis.  No radiographic evidence of diverticulitis.  Original Report Authenticated By: Danae Orleans, M.D.   US Renal  06/22/2011  *RADIOLOGY REPORT*  Clinical Data: Acute renal failure  RENAL/URINARY TRACT ULTRASOUND COMPLETE  Comparison:  CT 06/20/2011  Findings:  Right Kidney:  11.1 cm in length.  Several small cysts but no large cyst or mass.  There is an extrarenal pelvis but no true hydronephrosis.  Left Kidney:  11.4 cm in length.  Several small cysts including 8- 0.0 x 1.1 x 1.5 cm cyst of the upper medial cortex.  No hydronephrosis or mass lesion.  Bladder:  Foley catheter in place.  IMPRESSION: No evidence of renal obstruction.   A 2 cm upper pole cyst of the left kidney.  Numerous other smaller cysts bilaterally.  Extrarenal pelvis on the right  without evidence of true hydronephrosis.  Original Report Authenticated By: Thomasenia Sales, M.D.   Nm Pulmonary Per & Vent  06/20/2011  *RADIOLOGY REPORT*  Clinical Data: Congestive heart failure.  Pulmonary embolus and January 2009.  IVC filter in place.  Shortness of breath.  Elevated D-dimer.  NM PULMONARY VENTILATION AND PERFUSION SCAN  Radiopharmaceutical: 8.6 mCi xenon 133 inhaled.  4.3 mCi technetium 66m MAA IV.  Comparison: Chest x-ray dated 06/20/2011  Findings: The patient has chronic elevation of the right hemidiaphragm.  The ventilation study is otherwise normal.  The perfusion study is normal.  IMPRESSION: Normal ventilation perfusion lung scan.  No evidence of pulmonary embolism.  Original Report Authenticated By: Gwynn Burly, M.D.    Dg Chest Port 1 View  06/28/2011  *RADIOLOGY REPORT*  Clinical Data: Shortness of breath.  PORTABLE CHEST - 1 VIEW  Comparison: 06/23/2011.  Findings: The heart is enlarged but stable.  There is tortuosity and calcification of the thoracic aorta.  Low lung volumes with vascular crowding and atelectasis.  There is also moderate vascular congestion and interstitial edema.  Small bilateral pleural effusions are present.  IMPRESSION: CHF with small effusions.  Original Report Authenticated By: P. Loralie Champagne, M.D.   Dg Chest Port 1 View  06/23/2011  *RADIOLOGY REPORT*  Clinical Data: Cough.  Shortness of breath.  Congestive heart failure.  PORTABLE CHEST - 1 VIEW  Comparison: 06/22/2011  Findings: Low lung volumes are present, causing crowding of the pulmonary vasculature.  The interstitial edema shown on the prior exam has considerably improved.  There is continued obscuration of the left hemidiaphragm.  Dense mitral annular calcification noted.  Thoracic spondylosis noted.  IMPRESSION:  1.  Pulmonary venous hypertension noted.  The interstitial edema shown on the prior exam is significantly improved. 2.  Mild cardiomegaly with mitral annular calcification. 3.  Low lung volumes. 4.  Thoracic spondylosis. 5.  Indistinct airspace opacity in the left lower lobe, potentially confluent edema, pneumonia, or atelectasis.  Original Report Authenticated By: Dellia Cloud, M.D.   Dg Chest Port 1 View  06/22/2011  *RADIOLOGY REPORT*  Clinical Data: Shortness of breath.  Question congestive heart failure.  PORTABLE CHEST - 1 VIEW  Comparison: Plain films of the chest 12/01/2010, 07/03/2010 and 07/02/2009.  Findings: There is cardiomegaly with interstitial pulmonary edema. Hazy opacities over the chest bilaterally are compatible with layering pleural effusions.  Calcified mitral annulus is noted.  IMPRESSION: Cardiomegaly and pulmonary edema with bilateral pleural effusions.  Original Report Authenticated By:  Bernadene Bell. D'ALESSIO, M.D.   Dg Abd Acute W/chest  06/20/2011  *RADIOLOGY REPORT*  Clinical Data: Left mid abdominal pain, discomfort  ACUTE ABDOMEN SERIES (ABDOMEN 2 VIEW & CHEST 1 VIEW)  Comparison: 06/10/2010.  Findings:Cardiac leads overlie the chest.  Mitral annular calcification accounts for coarse radiodensities over the cardiac silhouettes. Lung volumes are low with crowding of the bronchovascular markings.  Allowing for this, no focal pulmonary opacity is seen.  IVC filter in place.  No free air beneath the diaphragms or on the decubitus projection.  Splenic arterial calcification noted without definite calcified aneurysm.  Normal bowel gas pattern.  No acute osseous abnormality.  IMPRESSION: Normal bowel gas pattern.  Low lung volumes. If the patient's symptoms continue, consider PA and lateral chest radiographs obtained at full inspiration when the patient is clinically able.  Original Report Authenticated By: Harrel Lemon, M.D.    Medications: I have reviewed the patient's current medications. Scheduled Meds:    . ALPRAZolam  0.25 mg Oral QHS  . antiseptic oral rinse  15 mL Mouth Rinse q12n4p  . chlorhexidine  15 mL Mouth Rinse BID  . digoxin  0.25 mg Intravenous Daily  . famotidine  20 mg Oral QHS  . furosemide  20 mg Intravenous BID  . furosemide  20 mg Intravenous Once  . guaiFENesin  600 mg Oral BID  . insulin aspart  0-15 Units Subcutaneous Q4H  . insulin glargine  10 Units Subcutaneous QHS  . metoprolol tartrate  12.5 mg Oral BID  . DISCONTD: digoxin  0.125 mg Intravenous Daily  . DISCONTD: insulin regular  0-10 Units Intravenous TID WC   Continuous Infusions:    . dextrose 5 % and 0.45% NaCl 20 mL/hr (07/13/11 0111)  . DISCONTD: 0.45 % NaCl with KCl 20 mEq / L 75 mL/hr at 07/13/11 2217  . DISCONTD: insulin (NOVOLIN-R) infusion Stopped (07/13/11 1730)   PRN Meds:.dextrose, morphine, ondansetron (ZOFRAN) IV Assessment/Plan:  Lower GI Bleed (07/12/2011)    Assessment: Pt s/p Left colectomy with colostomy.   Plan: Per surgery  Patient Active Hospital Problem List: Thrombocytopenia (06/25/2011)   Assessment: Platelets decreased from 117 to 90 today will continue to monitor. May need to transfuse if any signs of bleeding.   Plan: continue to monitor  Diabetes mellitus (06/20/2011)   Assessment: Pt on Lantus and Novolog and doing well.   Plan:will titrate as oral intake increases  CKD (chronic kidney disease), stage III (06/25/2011)   Assessment: Renal function stable. Continue to monitor   Chronic systolic heart failure (07/12/2011)   Assessment: 2-D Echo shows persistent  ef of 20%. Will need to monitor fluids closely. Continue Lasix.   Diverticulosis of colon with hemorrhage (07/12/2011)   Assessment: See above.    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  Updated Daughter at bedside.      LOS: 2 days

## 2011-07-14 NOTE — Progress Notes (Signed)
Patient ID: [name], female   DOB: [birth year], 78 y.o.   MRN: [id number] 2 Days Post-Op  Subjective: Pt doesn't speak much.  No nausea.   Objective: Vital signs in last 24 hours: Temp:  [97 F (36.1 C)-98.7 F (37.1 C)] 98.7 F (37.1 C) (02/26 0800) Pulse Rate:  [88-112] 112  (02/26 0800) Resp:  [16-22] 22  (02/26 0800) BP: (100-131)/(40-61) 129/56 mmHg (02/26 0800) SpO2:  [91 %-96 %] 93 % (02/26 0800) Last BM Date: 07/12/11  Intake/Output from previous day: 02/25 0701 - 02/26 0700 In: 2342 [I.V.:2090; Blood:200; IV Piggyback:52] Out: 770 [Urine:770] Intake/Output this shift:    PE: Abd: soft, -BS, ND, incision clean.  Ostomy with no air or stool.  Some serosang output.  Lab Results:   Basename 07/14/11 0804 07/13/11 0950 07/13/11 0300  WBC 13.9* -- 12.4*  HGB 9.3* 10.5* --  HCT 27.6* 30.3* --  PLT 90* -- 117*   BMET  Basename 07/14/11 0804 07/13/11 0300  NA 142 143  K 5.2* 5.2*  CL 114* 115*  CO2 22 23  GLUCOSE 96 215*  BUN 37* 49*  CREATININE 1.32* 1.39*  CALCIUM 7.3* 6.4*   PT/INR  Basename 07/14/11 0804 07/13/11 0317  LABPROT 16.5* 18.3*  INR 1.31 1.49     Studies/Results: Dg Chest 1 View  07/13/2011  *RADIOLOGY REPORT*  Clinical Data: Central line placement.  CHEST - 1 VIEW  Comparison: Chest radiograph performed 06/29/2011  Findings: The patient's right IJ line is noted ending about the cavoatrial junction.  The lungs remain hypoexpanded.  Previously noted diffuse bilateral airspace opacification has improved, with mild residual vascular congestion and vascular crowding noted, but no significant pulmonary edema now seen.  No pleural effusion or pneumothorax is identified.  The cardiomediastinal silhouette remains mildly enlarged. Calcification is noted within the thoracic aorta.  No acute osseous abnormalities are seen.  IMPRESSION:  1.  Right IJ line noted ending about the cavoatrial junction. 2.  Lungs remain hypoexpanded; diffuse bilateral  airspace opacification has significantly improved, with mild residual vascular congestion and mild cardiomegaly seen.  No significant pulmonary edema now present.  Original Report Authenticated By: Tonia Ghent, M.D.   Nm Gi Blood Loss  07/12/2011  *RADIOLOGY REPORT*  Clinical Data: 78 year old female with GI bleed.  NUCLEAR MEDICINE GASTROINTESTINAL BLEEDING STUDY  Technique:  Sequential abdominal images were obtained following intravenous administration of Tc-59m labeled red blood cells.  Radiopharmaceutical: 23.8MILLI CURIE ULTRATAG TECHNETIUM TC 38M- LABELED RED BLOOD CELLS IV KIT  Comparison: None  Findings: Activity identified within the left colon is noted, compatible with GI bleed.  The source of this GI bleed likely originates in the descending colon. No other abnormalities are present.  IMPRESSION: Active GI bleeding from the left/descending colon.  Original Report Authenticated By: Rosendo Gros, M.D.    Anti-infectives: Anti-infectives     Start     Dose/Rate Route Frequency Ordered Stop   07/13/11 0000   cefOXitin (MEFOXIN) 1 g in dextrose 5 % 50 mL IVPB        1 g 100 mL/hr over 30 Minutes Intravenous Every 6 hours 07/12/11 2133 07/13/11 1237           Assessment/Plan  1. S/p left hemicolectomy for LGI bleed 2. post-op ileus  3. ABL anemia  Plan: 1. Will cont NPO x sips and chips due to post op ileus.  Await bowel function. 2. Agree with mobilization with PT. 3. Follow hgb.   LOS: 2 days  [name] 07/14/2011

## 2011-07-15 LAB — GLUCOSE, CAPILLARY
Glucose-Capillary: 191 mg/dL — ABNORMAL HIGH (ref 70–99)
Glucose-Capillary: 140 mg/dL — ABNORMAL HIGH (ref 70–99)
Glucose-Capillary: 151 mg/dL — ABNORMAL HIGH (ref 70–99)
Glucose-Capillary: 139 mg/dL — ABNORMAL HIGH (ref 70–99)
Glucose-Capillary: 144 mg/dL — ABNORMAL HIGH (ref 70–99)

## 2011-07-15 LAB — CBC
HCT: 25.2 % — ABNORMAL LOW (ref 36.0–46.0)
MCH: 30.1 pg (ref 26.0–34.0)
MCV: 91.3 fL (ref 78.0–100.0)
Platelets: 87 10*3/uL — ABNORMAL LOW (ref 150–400)
RBC: 2.76 MIL/uL — ABNORMAL LOW (ref 3.87–5.11)
RDW: 19.9 % — ABNORMAL HIGH (ref 11.5–15.5)

## 2011-07-15 LAB — DIFFERENTIAL
Eosinophils Absolute: 0.1 10*3/uL (ref 0.0–0.7)
Eosinophils Relative: 1 % (ref 0–5)
Lymphocytes Relative: 19 % (ref 12–46)
Lymphs Abs: 2.2 10*3/uL (ref 0.7–4.0)
Monocytes Absolute: 1.1 10*3/uL — ABNORMAL HIGH (ref 0.1–1.0)

## 2011-07-15 LAB — BASIC METABOLIC PANEL
CO2: 22 mEq/L (ref 19–32)
Calcium: 7.6 mg/dL — ABNORMAL LOW (ref 8.4–10.5)
Chloride: 116 mEq/L — ABNORMAL HIGH (ref 96–112)
Creatinine, Ser: 1.59 mg/dL — ABNORMAL HIGH (ref 0.50–1.10)
Glucose, Bld: 195 mg/dL — ABNORMAL HIGH (ref 70–99)

## 2011-07-15 LAB — MAGNESIUM: Magnesium: 1.8 mg/dL (ref 1.5–2.5)

## 2011-07-15 MED ORDER — FUROSEMIDE 10 MG/ML IJ SOLN
40.0000 mg | Freq: Two times a day (BID) | INTRAMUSCULAR | Status: DC
  Administered 2011-07-15 – 2011-07-20 (×10): 40 mg via INTRAVENOUS
  Filled 2011-07-15 (×13): qty 4

## 2011-07-15 NOTE — Progress Notes (Signed)
3 Days Post-Op  Subjective: No major issues. Says she feels better. Denies n/v.  Pain ok.  hgb drifted down 1 point. No blood products yesterday  Objective: Vital signs in last 24 hours: Temp:  [97.5 F (36.4 C)-100 F (37.8 C)] 98.9 F (37.2 C) (02/27 0320) Pulse Rate:  [58-112] 89  (02/27 0600) Resp:  [16-22] 16  (02/27 0600) BP: (96-136)/(36-57) 115/44 mmHg (02/27 0600) SpO2:  [92 %-100 %] 100 % (02/27 0600) Weight:  [148 lb 9.4 oz (67.4 kg)] 148 lb 9.4 oz (67.4 kg) (02/27 0500) Last BM Date: 07/12/11  Intake/Output from previous day: 02/26 0701 - 02/27 0700 In: 665 [I.V.:665] Out: 1805 [Urine:1795; Stool:10] Intake/Output this shift:    Alert, appropriate, nad Soft, minimal TTP, incision c/d/i with wicks. Ostomy- dark, but appears viable. No air in bag. Trace blood in bag  Lab Results:   Basename 07/15/11 0410 07/14/11 0804  WBC 11.6* 13.9*  HGB 8.3* 9.3*  HCT 25.2* 27.6*  PLT 87* 90*   BMET  Basename 07/15/11 0410 07/14/11 0804  NA 145 142  K 5.5* 5.2*  CL 116* 114*  CO2 22 22  GLUCOSE 195* 96  BUN 42* 37*  CREATININE 1.59* 1.32*  CALCIUM 7.6* 7.3*   PT/INR  Basename 07/14/11 0804 07/13/11 0317  LABPROT 16.5* 18.3*  INR 1.31 1.49   ABG No results found for this basename: PHART:2,PCO2:2,PO2:2,HCO3:2 in the last 72 hours  Studies/Results: Dg Chest Port 1 View  07/14/2011  *RADIOLOGY REPORT*  Clinical Data: Postsurgical hypoxemia  PORTABLE CHEST - 1 VIEW  Comparison: 07/12/2011  Findings: Cardiomegaly again noted.  Central mild vascular congestion and mild perihilar interstitial prominence without convincing pulmonary edema.  Minimal basilar atelectasis without focal infiltration.  Stable right IJ central line position.  IMPRESSION: Central mild vascular congestion and mild perihilar interstitial prominence without convincing pulmonary edema.  Minimal basilar atelectasis without focal infiltration.  Stable right IJ central line position.  Original Report  Authenticated By: Natasha Mead, M.D.    Anti-infectives: Anti-infectives     Start     Dose/Rate Route Frequency Ordered Stop   07/13/11 0000   cefOXitin (MEFOXIN) 1 g in dextrose 5 % 50 mL IVPB        1 g 100 mL/hr over 30 Minutes Intravenous Every 6 hours 07/12/11 2133 07/13/11 1237          Assessment/Plan: s/p Procedure(s) (LRB): EXPLORATORY LAPAROTOMY (N/A) Extended Left colectomy with end colostomy for LGIB  Still no signs of bowel function.  Ok for pt to get swallow evaluation from speech. If passes swallow-can start clears  Anemia- surgery ok with hgb of 8 unless pt needs a higher hgb for cardiac reasons. Monitor cbc daily VTE prophylaxis - scds. Hold chemical vte for risk of bleeding  MOBILIZE. PT/OT Ok to transfer to SDU/floor from surgery point of view  [name]. Sara Campanile, MD, FACS General, Bariatric, & Minimally Invasive Surgery [facility], [place]   LOS: 3 days    [name] 07/15/2011

## 2011-07-15 NOTE — Progress Notes (Signed)
Patient started on 2L nasal cannula. O2 sats were consistently 89-91% for a short period of time around 4am. [name] family member states that the patient is on 2L at home. The patient is now tracking at 99% consistently.

## 2011-07-15 NOTE — Evaluation (Signed)
Clinical/Bedside Swallow Evaluation Patient Details  Name: [name] MRN: [id number] DOB: [birth year] Today's Date: 07/15/2011 [id number]  Past Medical History:  Past Medical History  Diagnosis Date  . Diabetes mellitus   . Hypertension   . Vascular disease   . Shortness of breath   . Bladder infection   . COPD (chronic obstructive pulmonary disease)   . Myocardial infarction   . Stroke   . GERD (gastroesophageal reflux disease)    Past Surgical History:  Past Surgical History  Procedure Date  . Bladder tack   . Femoral artery stent   . Abdominal hysterectomy   . Esophagogastroduodenoscopy 07/12/2011    Procedure: ESOPHAGOGASTRODUODENOSCOPY (EGD);  Surgeon: Theda Belfast, MD;  Location: [facility];  Service: Endoscopy;  Laterality: N/A;  bedside   . Flexible sigmoidoscopy 07/12/2011    Procedure: FLEXIBLE SIGMOIDOSCOPY;  Surgeon: Theda Belfast, MD;  Location: [facility];  Service: Endoscopy;  Laterality: N/A;   HPI:  78 yo female adm to [facility] with GI bleed 07/10/11, pt underwent EGD 07/12/11 identifying gastritis and gastroparesis with food retention, flexible sigmoidoscopy identifying hemmorhoids.  Pt is s/p hemicolectomy surgery for GI bleed.  Order received for swallow evaluation due to pt's daughter report of h/o with dysphagia.  Pt has undergone UGI in 04/26/2000 indicating hiatal hernia and small amount of gastroesophageal reflux.  Delayed gastric empyting noted on eval 04/27/00.  CXR 07/14/11 with minimal vascular congestion and mild perhilar interstitial proninence without pulm edema, bilateral atelectasis without focal infiltration.  PMH including a GI bleed 2011 and a "PIN Stroke" per daughter Steward Drone.  Daughter Steward Drone reports mother to have problems with swalloiwng *coughing on liquids partially through meal x1 month- consistent with timing of increased weakness & decr mobilitization.  Prior to hospitalization 2 weeks ago, pt was feeding herself.   MRI 2008 showed olc  lacunes left thalamuls and corona radiata-bilaterally.     Assessment/Recommendations/Treatment Plan Suspected Esophageal Findings Suspected Esophageal Findings: Other (comment) (known h/o gastroparesis, reflux-pt allergic to PPI)  SLP Assessment Clinical Impression Statement: BSE completed with daughter Steward Drone present, pt given few boluses of jello and water due to mental status/lethargy.  No overt clinical s/s of aspiration, however gross weakness, known h/o dysphagia and GI issues *gastroparesis/reflux/hiatal hernia place pt at moderate risk of aspiration with po intake.  Recommend initiate a clear liquid diet (small amounts throughout the day) with STRICT aspiration precautions.  Daughter thoroughly educated to precautions to maximize airway protection and risk factors for aspiration/aspiration pna.  Daughter verbalized understanding to demonstrated aspiration precautions and appears to have a good understanding.  SLP suspects swallow function will improve to baseline levels (prior to this admission) with medical/mental status improvement.  SLP to follow for education/tolerance.  Thanks for this referral.   Risk for Aspiration: Moderate Other Related Risk Factors: History of dysphagia;History of GERD;Cognitive impairment;Lethargy;Decreased respiratory status;History of esophageal-related issues;Previous CVA  Swallow Evaluation Recommendations Solid Consistency: No solids, see liquids Liquid Consistency: Thin Medication Administration: Crushed with puree (with puree/jello and follow with water) Supervision: Staff feed patient;Full supervision/cueing for compensatory strategies Compensations: Slow rate;Small sips/bites;Check for pocketing Postural Changes and/or Swallow Maneuvers: Seated upright 90 degrees;Upright 30-60 min after meal Oral Care Recommendations: Oral care QID  Treatment Plan Treatment Plan Recommendations: Therapy as outlined in treatment plan below Speech Therapy Frequency:  min 2x/week Treatment Duration: 2 weeks Interventions: Aspiration precaution training;Compensatory techniques;Patient/family education  Prognosis Prognosis for Safe Diet Advancement: Fair Barriers to Reach Goals: Severity of dysphagia;Time post onset;Cognitive deficits  Individuals Consulted Consulted and Agree with Results and Recommendations: Patient;Family member/caregiver Family Member Consulted: Brenda  Swallowing Goals  SLP Swallowing Goals Patient will utilize recommended strategies during swallow to increase swallowing safety with: Total assistance   General  Date of Onset: 07/15/11 HPI: 78 yo female adm to [facility] with GI bleed 07/10/11, pt underwent EGD 07/12/11 identifying gastritis and gastroparesis with food retention, flexible sigmoidoscopy identifying hemmorhoids.  Pt is s/p hemicolectomy surgery for GI bleed.  Order received for swallow evaluation due to pt's daughter report of h/o with dysphagia.  Pt has undergone UGI in 04/26/2000 indicating hiatal hernia and small amount of gastroesophageal reflux.  Delayed gastric empyting noted on eval 04/27/00.  CXR 07/14/11 with minimal vascular congestion and mild perhilar interstitial proninence without pulm edema, bilateral atelectasis without focal infiltration.  PMH including a GI bleed 2011 and a "PIN Stroke" per daughter Steward Drone.  Daughter Steward Drone reports mother to have problems with swalloiwng *coughing on liquids partially through meal x1 month- consistent with timing of increased weakness & decr mobilitization.  Prior to hospitalization 2 weeks ago, pt was feeding herself.   MRI 2008 showed olc lacunes left thalamuls and corona radiata-bilaterally.   Type of Study: Bedside swallow evaluation Diet Prior to this Study: Other (Comment) (sips/chips) Temperature Spikes Noted: No Respiratory Status: Supplemental O2 delivered via (comment) Behavior/Cognition: Lethargic;Requires cueing;Doesn't follow directions;Other (comment) (daughter report  MS decline, 50% responses-with delay) Oral Cavity - Dentition:  (does not always wear partials to eat per daughter) Vision: Impaired for self-feeding (pt legally blind) Patient Positioning: Upright in bed Baseline Vocal Quality: Low vocal intensity;Other (comment) (weak phonation) Volitional Cough: Other (Comment) (pt reports pain with cough, not tested, weak phonation) Volitional Swallow: Unable to elicit  Oral Motor/Sensory Function  Overall Oral Motor/Sensory Function: Impaired at baseline (generalized weakness, ? apraxia-)  Consistency Results  Ice Chips Ice chips: Not tested  Thin Liquid Thin Liquid: Impaired Presentation: Cup;Spoon Oral Phase Impairments: Reduced labial seal;Reduced lingual movement/coordination Pharyngeal  Phase Impairments: Delayed Swallow;Multiple swallows Other Comments: pt required verbal cueing to seal lips on spoon initially, multiple swallows but no overt clinical s/s of aspiration- few boluses tested only due to pt's lethargy-  Nectar Thick Liquid Nectar Thick Liquid: Not tested  Honey Thick Liquid Honey Thick Liquid: Not tested  Puree Puree: Impaired Presentation: Spoon Oral Phase Impairments: Reduced lingual movement/coordination;Impaired anterior to posterior transit Pharyngeal Phase Impairments: Delayed Swallow Other Comments: JELLO-   Solid Solid: Not tested Donavan Burnet, MS [facility] SLP [phone]

## 2011-07-15 NOTE — Progress Notes (Signed)
At 0200 patient appeared to be in a junctional rhythm. HR changed from 90s to high 50's/ low 60s and there was an absence of P wave shown of the monitor. Before a 12 lead could be obtained, patient returned to NSR and HR is now back in the low 90s. Patient remains asleep but will arouse to voice, as seen when this RN entered room to take a manual blood pressure.

## 2011-07-15 NOTE — Progress Notes (Signed)
Subjective:  Feeling better. Tolerating clear liquids. No fever.  Objective:  Vital Signs in the last 24 hours: Temp:  [97.5 F (36.4 C)-99.9 F (37.7 C)] 98.3 F (36.8 C) (02/27 1200) Pulse Rate:  [58-106] 99  (02/27 1200) Cardiac Rhythm:  [-] Normal sinus rhythm (02/27 0800) Resp:  [16-26] 25  (02/27 1200) BP: (96-136)/(36-57) 128/42 mmHg (02/27 1200) SpO2:  [92 %-100 %] 95 % (02/27 1200) Weight:  [67.4 kg (148 lb 9.4 oz)] 67.4 kg (148 lb 9.4 oz) (02/27 0500)  Physical Exam: BP Readings from Last 1 Encounters:  07/15/11 128/42    Wt Readings from Last 1 Encounters:  07/15/11 67.4 kg (148 lb 9.4 oz)    Weight change:   HEENT: [place]/AT, Eyes-Brown, PERL, EOMI, Conjunctiva-Pale, Sclera-Non-icteric Neck: No JVD, No bruit, Trachea midline. Lungs:  Clear, Bilateral. Cardiac:  Regular rhythm, normal S1 and S2, no S3.  Abdomen:  Soft. Surgical wound healing well Extremities:  No edema present. No cyanosis. No clubbing. CNS: AxOx3, Cranial nerves grossly intact, moves all 4 extremities. Right handed. Skin: Warm and dry.   Intake/Output from previous day: 02/26 0701 - 02/27 0700 In: 685 [I.V.:685] Out: 1805 [Urine:1795; Stool:10]    Lab Results: BMET    Component Value Date/Time   NA 145 07/15/2011 0410   K 5.5* 07/15/2011 0410   CL 116* 07/15/2011 0410   CO2 22 07/15/2011 0410   GLUCOSE 195* 07/15/2011 0410   BUN 42* 07/15/2011 0410   CREATININE 1.59* 07/15/2011 0410   CALCIUM 7.6* 07/15/2011 0410   GFRNONAA 29* 07/15/2011 0410   GFRAA 34* 07/15/2011 0410   CBC    Component Value Date/Time   WBC 11.6* 07/15/2011 0410   WBC 9.1 08/23/2009 0819   RBC 2.76* 07/15/2011 0410   RBC 4.42 08/23/2009 0819   HGB 8.3* 07/15/2011 0410   HGB 12.6 08/23/2009 0819   HCT 25.2* 07/15/2011 0410   HCT 38.4 08/23/2009 0819   PLT 87* 07/15/2011 0410   PLT 243 08/23/2009 0819   MCV 91.3 07/15/2011 0410   MCV 86.8 08/23/2009 0819   MCH 30.1 07/15/2011 0410   MCH 28.4 08/23/2009 0819   MCHC 32.9 07/15/2011 0410    MCHC 32.7 08/23/2009 0819   RDW 19.9* 07/15/2011 0410   RDW 16.1* 08/23/2009 0819   LYMPHSABS 2.2 07/15/2011 0410   LYMPHSABS 1.4 08/23/2009 0819   MONOABS 1.1* 07/15/2011 0410   MONOABS 0.7 08/23/2009 0819   EOSABS 0.1 07/15/2011 0410   EOSABS 0.3 08/23/2009 0819   BASOSABS 0.1 07/15/2011 0410   BASOSABS 0.0 08/23/2009 0819   CARDIAC ENZYMES Lab Results  Component Value Date   CKTOTAL 106 07/12/2011   CKMB 4.6* 07/12/2011   TROPONINI <0.30 07/12/2011    Assessment/Plan:  Patient Active Hospital Problem List:  Lower GI bleed, S/P left hemicolectomy and Colostomy. Anemia of blood loss Diabetes mellitus (06/20/2011) CKD (chronic kidney disease), stage III (06/25/2011) Thrombocytopenia (06/25/2011) Hypertension (06/20/2011) Chronic systolic heart failure (07/12/2011) Hyperkalemia  Follow with surgery.   LOS: 3 days    Orpah Cobb  MD  07/15/2011, 1:29 PM

## 2011-07-15 NOTE — Evaluation (Signed)
Physical Therapy Evaluation Patient Details Name: [name] MRN: [id number] DOB: [birth year] Today's Date: 07/15/2011  Problem List:  Patient Active Problem List  Diagnoses  . Candiduria  . Diabetes mellitus  . Hypertension  . Acute renal failure  . CKD (chronic kidney disease), stage III  . Thrombocytopenia  . Pulmonary edema  . Acute combined systolic and diastolic congestive heart failure  . Hyponatremia  . GI bleed  . LBBB (left bundle branch block)  . Chronic systolic heart failure  . Diverticulosis of colon with hemorrhage    Past Medical History:  Past Medical History  Diagnosis Date  . Diabetes mellitus   . Hypertension   . Vascular disease   . Shortness of breath   . Bladder infection   . COPD (chronic obstructive pulmonary disease)   . Myocardial infarction   . Stroke   . GERD (gastroesophageal reflux disease)    Past Surgical History:  Past Surgical History  Procedure Date  . Bladder tack   . Femoral artery stent   . Abdominal hysterectomy   . Esophagogastroduodenoscopy 07/12/2011    Procedure: ESOPHAGOGASTRODUODENOSCOPY (EGD);  Surgeon: Theda Belfast, MD;  Location: [facility];  Service: Endoscopy;  Laterality: N/A;  bedside   . Flexible sigmoidoscopy 07/12/2011    Procedure: FLEXIBLE SIGMOIDOSCOPY;  Surgeon: Theda Belfast, MD;  Location: [facility];  Service: Endoscopy;  Laterality: N/A;    PT Assessment/Plan/Recommendation PT Assessment Clinical Impression Statement: Patient admitted with GI bleed, s/p colectomy and colostomy on 07/12/11.  She presents with severely decreased strength (right worse than left,) decreased balance, decreased activity tolerance and diminished communication.  She will benefit from skilled PT in acute setting to  maximize  independence for decreased burden of care on her family at discharge.  Would need to be +1 assist for family to care for patient, therefore, may need STSNF initially if still at +2 assist. PT  Recommendation/Assessment: Patient will need skilled PT in the acute care venue PT Problem List: Decreased strength;Decreased range of motion;Decreased activity tolerance;Decreased balance;Decreased mobility;Pain PT Therapy Diagnosis : Generalized weakness;Acute pain PT Plan PT Frequency: Min 3X/week PT Treatment/Interventions: DME instruction;Gait training;Functional mobility training;Therapeutic activities;Therapeutic exercise;Balance training;Patient/family education PT Recommendation Follow Up Recommendations: Home health PT;Supervision/Assistance - 24 hour;Skilled nursing facility (depending on progress) Equipment Recommended:  (? hospital bed) PT Goals  Acute Rehab PT Goals PT Goal Formulation: With patient/family Time For Goal Achievement: 2 weeks Pt will go Supine/Side to Sit: with min assist PT Goal: Supine/Side to Sit - Progress: Goal set today Pt will go Sit to Supine/Side: with min assist PT Goal: Sit to Supine/Side - Progress: Goal set today Pt will go Sit to Stand: with mod assist PT Goal: Sit to Stand - Progress: Goal set today Pt will go Stand to Sit: with min assist PT Goal: Stand to Sit - Progress: Goal set today Pt will Ambulate: 16 - 50 feet;with rolling walker;with mod assist PT Goal: Ambulate - Progress: Goal set today Pt will Perform Home Exercise Program: with supervision, verbal cues required/provided PT Goal: Perform Home Exercise Program - Progress: Goal set today  PT Evaluation Precautions/Restrictions  Precautions Precautions: Fall Prior Functioning  Home Living Lives With: Daughter Receives Help From: Family Type of Home: House Home Layout: One level Home Access: Stairs to enter Entrance Stairs-Rails: Right Entrance Stairs-Number of Steps: 5 Home Adaptive Equipment: Environmental consultant - four wheeled;Wheelchair - Careers adviser (comment);Bedside commode/3-in-1;Tub transfer bench (home O2) Prior Function Level of Independence: Needs assistance with  tranfers;Needs assistance  with ADLs Cognition Cognition Arousal/Alertness: Lethargic Overall Cognitive Status: History of cognitive impairments History of Cognitive Impairment: Decline in baseline functioning (due to lethargy) Following Commands: Follows one step commands inconsistently Cognition - Other Comments: daughter reports patient would talk to whom she wanted when she wanted.  Did not utter any intelligible words during my session, but did make eye contact and follow simple one-step commands  Sensation/Coordination   Extremity Assessment RUE Assessment RUE Assessment: Exceptions to [facility] RUE AROM (degrees) RUE Overall AROM Comments: PROM WFL RUE Strength RUE Overall Strength Comments: unable to lift antigravity or even squeeze my hand.  did prop on right elbow in sitting LUE Assessment LUE Assessment: Exceptions to WFL LUE AROM (degrees) LUE Overall AROM Comments: AAROM grossly WFL LUE Strength LUE Overall Strength Comments: grossly 2+/5 RLE Assessment RLE Assessment: Not tested (but functionally weak with attempts to stand) LLE Assessment LLE Assessment: Not tested (but functionally weak with attempts to stand) Mobility (including Balance) Bed Mobility Bed Mobility: Yes Supine to Sit: 1: +2 Total assist;HOB elevated (Comment degrees) Supine to Sit Details (indicate cue type and reason): HOB 35*, pt=30% Sitting - Scoot to Edge of Bed: 3: Mod assist Sitting - Scoot to Edge of Bed Details (indicate cue type and reason): scooted forward on pad under patient Transfers Sit to Stand: 1: +2 Total assist;From bed Sit to Stand Details (indicate cue type and reason): pt=20% assist to bring hips forward and shoulders back as well as lifting assist Stand to Sit: To bed;1: +2 Total assist Stand to Sit Details: pt 10% assist for controlled descent (unable to stand with walker to take steps)  Daughter reports she has been fearful since fall couple of weeks ago Stand Pivot Transfers: 1:  +2 Total assist Stand Pivot Transfer Details (indicate cue type and reason): pt=20%; patient able to take couple of steps with +2 assist  Posture/Postural Control Posture/Postural Control: Postural limitations Postural Limitations: forward head and rounded shoulders Static Sitting Balance Static Sitting - Balance Support: Right upper extremity supported;Left upper extremity supported Static Sitting - Level of Assistance: 3: Mod assist Static Sitting - Comment/# of Minutes: leans to right supported with elbow on my leg sitting beside her Exercise    End of Session PT - End of Session Equipment Utilized During Treatment: Gait belt Activity Tolerance: Patient limited by fatigue Patient left: in chair;with call bell in reach;with family/visitor present Nurse Communication: Mobility status for transfers General Behavior During Session: Lethargic Cognition: Impaired, at baseline  [facility] 07/15/2011, 2:09 PM

## 2011-07-15 NOTE — Plan of Care (Signed)
Problem: Phase I Progression Outcomes Goal: Pain controlled with appropriate interventions Outcome: Progressing PRN medications still needed

## 2011-07-15 NOTE — Progress Notes (Signed)
Subjective: No n/v. Pain tolerable.  Objective: Vital signs in last 24 hours: Temp:  [97.5 F (36.4 C)-100 F (37.8 C)] 99.1 F (37.3 C) (02/27 0800) Pulse Rate:  [58-112] 95  (02/27 0800) Resp:  [16-21] 18  (02/27 0800) BP: (96-136)/(36-57) 128/50 mmHg (02/27 0800) SpO2:  [92 %-100 %] 99 % (02/27 0800) Weight:  [67.4 kg (148 lb 9.4 oz)] 67.4 kg (148 lb 9.4 oz) (02/27 0500) Weight change:  Last BM Date: 07/12/11  Intake/Output from previous day: 02/26 0701 - 02/27 0700 In: 685 [I.V.:685] Out: 1805 [Urine:1795; Stool:10] Total I/O In: 22 [I.V.:20; IV Piggyback:2] Out: 200 [Urine:200]   Physical Exam: General: Alert, awake, oriented x3, in no acute distress. HEENT: No bruits, no goiter. Heart: Regular rate and rhythm, without murmurs, rubs, gallops. Lungs: Clear to auscultation bilaterally. Abdomen: Soft, nontender, nondistended, positive bowel sounds. Extremities: 1-2+ edema.    Lab Results: Basic Metabolic Panel:  Basename 07/15/11 0410 07/14/11 0804  NA 145 142  K 5.5* 5.2*  CL 116* 114*  CO2 22 22  GLUCOSE 195* 96  BUN 42* 37*  CREATININE 1.59* 1.32*  CALCIUM 7.6* 7.3*  MG 1.8 --  PHOS -- --   Liver Function Tests:  Basename 07/14/11 0804 07/13/11 0300  AST 33 24  ALT 52* 55*  ALKPHOS 75 72  BILITOT 1.1 0.9  PROT 4.5* 3.9*  ALBUMIN 1.9* 1.8*    CBC:  Basename 07/15/11 0410 07/14/11 0804  WBC 11.6* 13.9*  NEUTROABS 8.1* 9.5*  HGB 8.3* 9.3*  HCT 25.2* 27.6*  MCV 91.3 89.6  PLT 87* 90*   Cardiac Enzymes:  Basename 07/12/11 1700  CKTOTAL 106  CKMB 4.6*  CKMBINDEX --  TROPONINI <0.30   BNP:  Basename 07/13/11 0950  PROBNP 3502.0*   CBG:  Basename 07/15/11 0739 07/15/11 0356 07/14/11 2332 07/14/11 1934 07/14/11 1533 07/14/11 1145  GLUCAP 140* 191* 189* 197* 158* 115*   Coagulation:  Basename 07/14/11 0804 07/13/11 0317  LABPROT 16.5* 18.3*  INR 1.31 1.49    Recent Results (from the past 240 hour(s))  MRSA PCR SCREENING      Status: Normal   Collection Time   07/12/11 10:50 AM      Component Value Range Status Comment   MRSA by PCR NEGATIVE  NEGATIVE  Final     Studies/Results: Dg Chest Port 1 View  07/14/2011  *RADIOLOGY REPORT*  Clinical Data: Postsurgical hypoxemia  PORTABLE CHEST - 1 VIEW  Comparison: 07/12/2011  Findings: Cardiomegaly again noted.  Central mild vascular congestion and mild perihilar interstitial prominence without convincing pulmonary edema.  Minimal basilar atelectasis without focal infiltration.  Stable right IJ central line position.  IMPRESSION: Central mild vascular congestion and mild perihilar interstitial prominence without convincing pulmonary edema.  Minimal basilar atelectasis without focal infiltration.  Stable right IJ central line position.  Original Report Authenticated By: Natasha Mead, M.D.    Medications: Scheduled Meds:   . ALPRAZolam  0.25 mg Oral QHS  . antiseptic oral rinse  15 mL Mouth Rinse q12n4p  . chlorhexidine  15 mL Mouth Rinse BID  . digoxin  0.25 mg Intravenous Daily  . famotidine  20 mg Oral QHS  . furosemide  20 mg Intravenous BID  . furosemide  20 mg Intravenous Once  . guaiFENesin  600 mg Oral BID  . insulin aspart  0-15 Units Subcutaneous Q4H  . insulin glargine  10 Units Subcutaneous QHS  . metoprolol tartrate  12.5 mg Oral BID   Continuous Infusions:   .  dextrose 5 % and 0.45% NaCl 20 mL/hr at 07/15/11 0245  . DISCONTD: 0.45 % NaCl with KCl 20 mEq / L 75 mL/hr at 07/13/11 2217  . DISCONTD: insulin (NOVOLIN-R) infusion Stopped (07/13/11 1730)   PRN Meds:.dextrose, morphine, ondansetron (ZOFRAN) IV  Assessment/Plan:  Active Problems:  Diabetes mellitus  Hypertension  CKD (chronic kidney disease), stage III  Thrombocytopenia  GI bleed  Chronic systolic heart failure  Diverticulosis of colon with hemorrhage   #1 Lower GI Bleed: s/p left colectomy and colostomy. Per surgery ok to start clears if passes swallow eval. Continued plans per  surgery.  #2 Thrombocytopenia: Platelets continue to decrease from 90-87. No need for transfusion at this point. Monitor.  #3 ABLA: Hb down to 8.3 today from 9.3. Did not receive blood products yesterday. No plans for transfusion at this point. Recheck CBC in am.  #4 CKD Stage III: Cr at baseline. Follow.  #5 Chronic Systolic CHF: ECHO with EF 20%. Monitor fluid status closely. Continue lasix. Her cardiologist ,Dr. Sharyn Lull, is on board.  #6 Dispo: transfer to regular floor today. Monitor volume status and Hb closely and advance diet as tolerated. PT evals in am.   LOS: 3 days   [name],[name] [facility] Pager: [phone] 07/15/2011, 9:43 AM

## 2011-07-16 ENCOUNTER — Inpatient Hospital Stay (HOSPITAL_COMMUNITY): Admit: 2011-07-16 | Payer: Medicare Other

## 2011-07-16 LAB — TYPE AND SCREEN
ABO/RH(D): A POS
Unit division: 0
Unit division: 0
Unit division: 0
Unit division: 0

## 2011-07-16 LAB — GLUCOSE, CAPILLARY
Glucose-Capillary: 143 mg/dL — ABNORMAL HIGH (ref 70–99)
Glucose-Capillary: 143 mg/dL — ABNORMAL HIGH (ref 70–99)
Glucose-Capillary: 156 mg/dL — ABNORMAL HIGH (ref 70–99)
Glucose-Capillary: 162 mg/dL — ABNORMAL HIGH (ref 70–99)
Glucose-Capillary: 179 mg/dL — ABNORMAL HIGH (ref 70–99)
Glucose-Capillary: 187 mg/dL — ABNORMAL HIGH (ref 70–99)
Glucose-Capillary: 191 mg/dL — ABNORMAL HIGH (ref 70–99)
Glucose-Capillary: 221 mg/dL — ABNORMAL HIGH (ref 70–99)
Glucose-Capillary: 61 mg/dL — ABNORMAL LOW (ref 70–99)

## 2011-07-16 LAB — CBC
MCHC: 32.5 g/dL (ref 30.0–36.0)
Platelets: 74 10*3/uL — ABNORMAL LOW (ref 150–400)
RDW: 19.9 % — ABNORMAL HIGH (ref 11.5–15.5)
WBC: 10.2 10*3/uL (ref 4.0–10.5)

## 2011-07-16 LAB — BASIC METABOLIC PANEL
BUN: 42 mg/dL — ABNORMAL HIGH (ref 6–23)
Chloride: 114 mEq/L — ABNORMAL HIGH (ref 96–112)
GFR calc Af Amer: 33 mL/min — ABNORMAL LOW (ref >90)
GFR calc non Af Amer: 28 mL/min — ABNORMAL LOW (ref >90)
Potassium: 4.6 mEq/L (ref 3.5–5.1)
Sodium: 143 mEq/L (ref 135–145)

## 2011-07-16 NOTE — Progress Notes (Signed)
Subjective:  Not happy with diet restriction.  Objective:  Vital Signs in the last 24 hours: Temp:  [97.2 F (36.2 C)-100.5 F (38.1 C)] 98.6 F (37 C) (02/28 1416) Pulse Rate:  [87-101] 87  (02/28 1416) Cardiac Rhythm:  [-] Normal sinus rhythm;Sinus tachycardia (02/28 0920) Resp:  [18-23] 18  (02/28 1416) BP: (106-124)/(40-73) 113/67 mmHg (02/28 1416) SpO2:  [92 %-100 %] 100 % (02/28 1416)  Physical Exam: BP Readings from Last 1 Encounters:  07/16/11 113/67    Wt Readings from Last 1 Encounters:  07/15/11 67.4 kg (148 lb 9.4 oz)    Weight change:   HEENT: [place]/AT, Eyes-Brown, PERL, EOMI, Conjunctiva-Pale, Sclera-Non-icteric Neck: No JVD, No bruit, Trachea midline. Lungs:  Clear, Bilateral. Cardiac:  Regular rhythm, normal S1 and S2, no S3. III/VI systolic murmur. Abdomen:  Soft, non-tender. Extremities:  No edema present. No cyanosis. No clubbing. CNS: AxOx3, Cranial nerves grossly intact, moves all 4 extremities. Right handed. Skin: Warm and dry.   Intake/Output from previous day: 02/27 0701 - 02/28 0700 In: 302 [P.O.:60; I.V.:240; IV Piggyback:2] Out: 1825 [Urine:1825]    Lab Results: BMET    Component Value Date/Time   NA 143 07/16/2011 0536   K 4.6 07/16/2011 0536   CL 114* 07/16/2011 0536   CO2 24 07/16/2011 0536   GLUCOSE 194* 07/16/2011 0536   BUN 42* 07/16/2011 0536   CREATININE 1.62* 07/16/2011 0536   CALCIUM 8.0* 07/16/2011 0536   GFRNONAA 28* 07/16/2011 0536   GFRAA 33* 07/16/2011 0536   CBC    Component Value Date/Time   WBC 10.2 07/16/2011 0536   WBC 9.1 08/23/2009 0819   RBC 2.56* 07/16/2011 0536   RBC 4.42 08/23/2009 0819   HGB 7.7* 07/16/2011 0536   HGB 12.6 08/23/2009 0819   HCT 23.7* 07/16/2011 0536   HCT 38.4 08/23/2009 0819   PLT 74* 07/16/2011 0536   PLT 243 08/23/2009 0819   MCV 92.6 07/16/2011 0536   MCV 86.8 08/23/2009 0819   MCH 30.1 07/16/2011 0536   MCH 28.4 08/23/2009 0819   MCHC 32.5 07/16/2011 0536   MCHC 32.7 08/23/2009 0819   RDW 19.9* 07/16/2011  0536   RDW 16.1* 08/23/2009 0819   LYMPHSABS 2.2 07/15/2011 0410   LYMPHSABS 1.4 08/23/2009 0819   MONOABS 1.1* 07/15/2011 0410   MONOABS 0.7 08/23/2009 0819   EOSABS 0.1 07/15/2011 0410   EOSABS 0.3 08/23/2009 0819   BASOSABS 0.1 07/15/2011 0410   BASOSABS 0.0 08/23/2009 0819   CARDIAC ENZYMES Lab Results  Component Value Date   CKTOTAL 106 07/12/2011   CKMB 4.6* 07/12/2011   TROPONINI <0.30 07/12/2011    Assessment/Plan:  Patient Active Hospital Problem List:  Acute Lower GI bleed S/P left hemicolectomy and Colostomy.  Anemia of blood loss  Diabetes mellitus (06/20/2011) CKD (chronic kidney disease), stage III (06/25/2011) Thrombocytopenia (06/25/2011) Hypertension (06/20/2011) Chronic systolic heart failure (07/12/2011)  Hyperkalemia-Resolved  Follow with surgery.    LOS: 4 days    Orpah Cobb  MD  07/16/2011, 6:50 PM

## 2011-07-16 NOTE — Plan of Care (Signed)
Patient has been transferred to room 1319 and received by the RN.

## 2011-07-16 NOTE — Progress Notes (Signed)
More alert.  Air in ostomy bag.  Clears for today. If tolerates, will adv diet Friday hgb slightly down - could be dilutional. Repeat in AM. Hold heparin/lovenox given anemia and risk of bleeding.   Ambulate, pt/ot.  [name]. Andrey Campanile, MD, FACS General, Bariatric, & Minimally Invasive Surgery [facility], [place]

## 2011-07-16 NOTE — Progress Notes (Signed)
07-16-11 Spoke with Mrs Leinweber' dtr, Myrene Buddy Billingslely at bedside to discuss dc plans. The plan is to return home with [facility] at dc. Patient lives with dtr, Lowella Dell. Has dme at home. Reports not needing a hospital bed. Already active with [facility] for PT/RN services. Also confirmed with Darl Pikes with [facility]. PCP: Dr Margaretmary Bayley. No further needs assessed.  [address], RN,BSN, [place] [phone]

## 2011-07-16 NOTE — Progress Notes (Signed)
Patient ID: [name], female   DOB: [birth year], 78 y.o.   MRN: [id number] 4 Days Post-Op  Subjective: Pt without complaints.  Appears sleepy.  No nausea.  Objective: Vital signs in last 24 hours: Temp:  [97.2 F (36.2 C)-100.5 F (38.1 C)] 97.2 F (36.2 C) (02/28 0410) Pulse Rate:  [93-102] 98  (02/28 0410) Resp:  [18-26] 18  (02/28 0410) BP: (105-128)/(40-73) 117/70 mmHg (02/28 0410) SpO2:  [92 %-100 %] 100 % (02/28 0410) Last BM Date:  (Colostomy)  Intake/Output from previous day: 02/27 0701 - 02/28 0700 In: 302 [P.O.:60; I.V.:240; IV Piggyback:2] Out: 1825 [Urine:1825] Intake/Output this shift:    PE: Abd: soft, tender, +BS, ostomy with air in bag, no stool, but some bloody drainage (minimal), ND  Lab Results:   [facility] 07/16/11 0536 07/15/11 0410  WBC 10.2 11.6*  HGB 7.7* 8.3*  HCT 23.7* 25.2*  PLT 74* 87*   BMET  Basename 07/16/11 0536 07/15/11 0410  NA 143 145  K 4.6 5.5*  CL 114* 116*  CO2 24 22  GLUCOSE 194* 195*  BUN 42* 42*  CREATININE 1.62* 1.59*  CALCIUM 8.0* 7.6*   PT/INR  Basename 07/14/11 0804  LABPROT 16.5*  INR 1.31     Studies/Results: Dg Chest Port 1 View  07/14/2011  *RADIOLOGY REPORT*  Clinical Data: Postsurgical hypoxemia  PORTABLE CHEST - 1 VIEW  Comparison: 07/12/2011  Findings: Cardiomegaly again noted.  Central mild vascular congestion and mild perihilar interstitial prominence without convincing pulmonary edema.  Minimal basilar atelectasis without focal infiltration.  Stable right IJ central line position.  IMPRESSION: Central mild vascular congestion and mild perihilar interstitial prominence without convincing pulmonary edema.  Minimal basilar atelectasis without focal infiltration.  Stable right IJ central line position.  Original Report Authenticated By: Natasha Mead, M.D.    Anti-infectives: Anti-infectives     Start     Dose/Rate Route Frequency Ordered Stop   07/13/11 0000   cefOXitin (MEFOXIN) 1 g in dextrose 5 % 50  mL IVPB        1 g 100 mL/hr over 30 Minutes Intravenous Every 6 hours 07/12/11 2133 07/13/11 1237           Assessment/Plan  1. S/p extended left hemicolectomy for LGI bleed 2. ABL anemia 3. Post-op ileus  Plan: 1. Will allow clear liquids today, strict aspiration precautions. 2. Follow hgb trend.  No evidence of acute bleeding in ostomy, but hgb continues to slowly trend down. 3. Cont PT/OT for mobilization   LOS: 4 days    Antwion Carpenter E 07/16/2011

## 2011-07-16 NOTE — Progress Notes (Signed)
Subjective: No n/v. Pain tolerable. Daughter Myrene Buddy present and updated.  Objective: Vital signs in last 24 hours: Temp:  [97.2 F (36.2 C)-100.5 F (38.1 C)] 98.6 F (37 C) (02/28 1416) Pulse Rate:  [87-101] 87  (02/28 1416) Resp:  [18-25] 18  (02/28 1416) BP: (105-124)/(40-73) 113/67 mmHg (02/28 1416) SpO2:  [92 %-100 %] 100 % (02/28 1416) Weight change:  Last BM Date:  (Colostomy)  Intake/Output from previous day: 02/27 0701 - 02/28 0700 In: 302 [P.O.:60; I.V.:240; IV Piggyback:2] Out: 1825 [Urine:1825] Total I/O In: 240 [P.O.:240] Out: -    Physical Exam: General: Alert, awake, oriented x3, in no acute distress. HEENT: No bruits, no goiter. Heart: Regular rate and rhythm, without murmurs, rubs, gallops. Lungs: Clear to auscultation bilaterally. Abdomen: Soft, nontender, nondistended, positive bowel sounds. Extremities: 1-2+ edema.    Lab Results: Basic Metabolic Panel:  Basename 07/16/11 0536 07/15/11 0410  NA 143 145  K 4.6 5.5*  CL 114* 116*  CO2 24 22  GLUCOSE 194* 195*  BUN 42* 42*  CREATININE 1.62* 1.59*  CALCIUM 8.0* 7.6*  MG -- 1.8  PHOS -- --   Liver Function Tests:  [facility] 07/14/11 0804  AST 33  ALT 52*  ALKPHOS 75  BILITOT 1.1  PROT 4.5*  ALBUMIN 1.9*    CBC:  Basename 07/16/11 0536 07/15/11 0410 07/14/11 0804  WBC 10.2 11.6* --  NEUTROABS -- 8.1* 9.5*  HGB 7.7* 8.3* --  HCT 23.7* 25.2* --  MCV 92.6 91.3 --  PLT 74* 87* --   CBG:  Basename 07/16/11 1214 07/16/11 0815 07/16/11 0405 07/15/11 2358 07/15/11 2221 07/15/11 1940  GLUCAP 196* 187* 199* 162* 144* 139*   Coagulation:  Basename 07/14/11 0804  LABPROT 16.5*  INR 1.31    Recent Results (from the past 240 hour(s))  MRSA PCR SCREENING     Status: Normal   Collection Time   07/12/11 10:50 AM      Component Value Range Status Comment   MRSA by PCR NEGATIVE  NEGATIVE  Final     Studies/Results: No results found.  Medications: Scheduled Meds:    .  ALPRAZolam  0.25 mg Oral QHS  . antiseptic oral rinse  15 mL Mouth Rinse q12n4p  . chlorhexidine  15 mL Mouth Rinse BID  . digoxin  0.25 mg Intravenous Daily  . famotidine  20 mg Oral QHS  . furosemide  40 mg Intravenous BID  . guaiFENesin  600 mg Oral BID  . insulin aspart  0-15 Units Subcutaneous Q4H  . insulin glargine  10 Units Subcutaneous QHS  . metoprolol tartrate  12.5 mg Oral BID   Continuous Infusions:    . dextrose 5 % and 0.45% NaCl 20 mL/hr at 07/15/11 0245   PRN Meds:.dextrose, morphine, ondansetron (ZOFRAN) IV  Assessment/Plan:  Active Problems:  Diabetes mellitus  Hypertension  CKD (chronic kidney disease), stage III  Thrombocytopenia  GI bleed  Chronic systolic heart failure  Diverticulosis of colon with hemorrhage   #1 Lower GI Bleed: s/p left colectomy and colostomy. On clears, doing well, is actually hungry today. Continued plans per surgery.  #2 Thrombocytopenia: Platelets continue to Slowly trend down. 74 today. D/w Dr. Cyndie Chime who will followup as an outpatient.  #3 ABLA: Hb down to 7.7 today.  Did not receive blood products yesterday. No plans for transfusion at this point. Recheck CBC in am.  #4 CKD Stage III: Cr at baseline. Follow.  #5 Chronic Systolic CHF: ECHO with EF 20%. Monitor  fluid status closely. Continue lasix. Her cardiologist ,Dr. Sharyn Lull, is on board.  #6 Dispo:  Monitor volume status and Hb closely and advance diet as tolerated. Plan for DC home once tolerating solids; hopefully in 48 hours.   LOS: 4 days   [facility] Pager: [phone] 07/16/2011, 3:29 PM

## 2011-07-16 NOTE — Progress Notes (Signed)
Speech Language/Pathology Speech Pathology: Dysphagia Treatment Note  Patient was observed with : popsicle and Thin liquids.  Daughter feeding pt first clear liquid tray.   Patient was noted to have s/s of aspiration : minimal amount of coughing noted, daughter Myrene Buddy states pt has phlegm with expectoration of yellow tinged secretions yesterday, cough observed with and without intake.    Daughter stated pt's swallow currently is better than prior to admission, overt cough with liquids reported by daughter before this admit.  Pt was consuming regular diet prior to admission. Sippy cup used at home with pt and family noted increased coughing-improved currently. Educated pt's daughter Myrene Buddy to aspiration precautions and SLP role to assist to maximize airway protection, she verbalized understanding.   Lung Sounds:  clear Temperature: afebrile today  Patient required:  Pt with delayed swallow initiation up to 15 seconds.  Clinical Impression: Much improved LOA today and participation.    Recommendations:  Continue diet per surgery with full assistance and strict asp precautions.  ST to follow for monitoring of tolerance and modifications as needed with diet advancement.    Pain:   none Intervention Required:   No  Goals: Goals Partially Met  Donavan Burnet, MS [facility] SLP [phone]

## 2011-07-17 LAB — CBC
Hemoglobin: 7.5 g/dL — ABNORMAL LOW (ref 12.0–15.0)
MCH: 29.8 pg (ref 26.0–34.0)
MCV: 92.9 fL (ref 78.0–100.0)
Platelets: 67 10*3/uL — ABNORMAL LOW (ref 150–400)
RBC: 2.52 MIL/uL — ABNORMAL LOW (ref 3.87–5.11)
WBC: 8.2 10*3/uL (ref 4.0–10.5)

## 2011-07-17 LAB — GLUCOSE, CAPILLARY: Glucose-Capillary: 163 mg/dL — ABNORMAL HIGH (ref 70–99)

## 2011-07-17 MED ORDER — MORPHINE SULFATE 2 MG/ML IJ SOLN: 0.5000 mg | INTRAMUSCULAR | Status: DC | PRN

## 2011-07-17 MED ORDER — OXYCODONE-ACETAMINOPHEN 5-325 MG PO TABS: 1.0000 | ORAL_TABLET | ORAL | Status: DC | PRN

## 2011-07-17 MED ORDER — ACETAMINOPHEN 160 MG/5ML PO SOLN
650.0000 mg | ORAL | Status: DC | PRN
  Administered 2011-07-19 (×2): 650 mg via ORAL
  Filled 2011-07-17 (×2): qty 20.3

## 2011-07-17 NOTE — Progress Notes (Signed)
5 Days Post-Op  Subjective: TM 100.8 PM 2/27 afebrile since, VSS. All BP's lying down. I/O not really recorded.   H/H7.5/23.4 today.  Sleepy/tired up in chair earlier, and just had pain meds, didn't talk much but follows commands well. Objective: Vital signs in last 24 hours: Temp:  [97.4 F (36.3 C)-98.6 F (37 C)] 98.2 F (36.8 C) (03/01 0454) Pulse Rate:  [87-96] 96  (03/01 0454) Resp:  [16-18] 18  (03/01 0454) BP: (105-114)/(65-70) 105/65 mmHg (03/01 0454) SpO2:  [100 %] 100 % (03/01 0454) Last BM Date:  (new colostomy)  Intake/Output from previous day: 02/28 0701 - 03/01 0700 In: 240 [P.O.:240] Out: 1050 [Urine:1050] Intake/Output this shift: Total I/O In: 60 [P.O.:60] Out: -   PE:  Sleepy, but follows commands, Chest: Rales L base, otherwise clear. Insentive Spirometer is in bedside stand.  Daughter say she hasn't been able to do it. Abd:  Soft, some gas in bag.  She has old bloody drainage in Colostomy bag, no stool I can see.  +BS, Wound looks fine Wicks in place.  Lab Results:   [facility] 07/17/11 0519 07/16/11 0536  WBC 8.2 10.2  HGB 7.5* 7.7*  HCT 23.4* 23.7*  PLT 67* 74*    Lab 07/14/11 0804 07/13/11 0300 07/12/11 0620  AST 33 24 52*  ALT 52* 55* 163*  ALKPHOS 75 72 204*  BILITOT 1.1 0.9 1.5*  PROT 4.5* 3.9* 6.8  ALBUMIN 1.9* 1.8* 2.8*    BMET  Basename 07/16/11 0536 07/15/11 0410  NA 143 145  K 4.6 5.5*  CL 114* 116*  CO2 24 22  GLUCOSE 194* 195*  BUN 42* 42*  CREATININE 1.62* 1.59*  CALCIUM 8.0* 7.6*   PT/INR No results found for this basename: LABPROT:2,INR:2 in the last 72 hours   Studies/Results: No results found.  Anti-infectives: Anti-infectives     Start     Dose/Rate Route Frequency Ordered Stop   07/13/11 0000   cefOXitin (MEFOXIN) 1 g in dextrose 5 % 50 mL IVPB        1 g 100 mL/hr over 30 Minutes Intravenous Every 6 hours 07/12/11 2133 07/13/11 1237         Current Facility-Administered Medications  Medication Dose  Route Frequency Provider Last Rate Last Dose  . ALPRAZolam (XANAX) tablet 0.25 mg  0.25 mg Oral QHS Michelle A. Ashley Royalty, MD   0.25 mg at 07/16/11 2209  . antiseptic oral rinse (BIOTENE) solution 15 mL  15 mL Mouth Rinse q12n4p Michelle A. Ashley Royalty, MD   15 mL at 07/16/11 1731  . chlorhexidine (PERIDEX) 0.12 % solution 15 mL  15 mL Mouth Rinse BID Michelle A. Ashley Royalty, MD   15 mL at 07/17/11 0837  . dextrose 5 %-0.45 % sodium chloride infusion   Intravenous Continuous Shelba Flake, MD 20 mL/hr at 07/15/11 0245    . dextrose 50 % solution 25 mL  25 mL Intravenous PRN Helen Hashimoto, MD      . digoxin [facility]) injection 0.25 mg  0.25 mg Intravenous Daily Robynn Pane, MD   0.25 mg at 07/16/11 0930  . famotidine (PEPCID) tablet 20 mg  20 mg Oral QHS Michelle A. Ashley Royalty, MD   20 mg at 07/17/11 0002  . furosemide (LASIX) injection 40 mg  40 mg Intravenous BID Ricki Rodriguez, MD   40 mg at 07/17/11 0836  . guaiFENesin (MUCINEX) 12 hr tablet 600 mg  600 mg Oral BID Leslye Peer, MD   600 mg  at 07/16/11 2208  . insulin aspart (novoLOG) injection 0-15 Units  0-15 Units Subcutaneous Q4H Anders Simmonds, NP   3 Units at 07/17/11 0836  . insulin glargine (LANTUS) injection 10 Units  10 Units Subcutaneous QHS Anders Simmonds, NP   10 Units at 07/16/11 2210  . metoprolol tartrate (LOPRESSOR) tablet 12.5 mg  12.5 mg Oral BID Michelle A. Ashley Royalty, MD   12.5 mg at 07/16/11 0916  . morphine 2 MG/ML injection 2 mg  2 mg Intravenous Q2H PRN Atilano Ina, MD,FACS   2 mg at 07/17/11 0836  . ondansetron (ZOFRAN) injection 4 mg  4 mg Intravenous Q6H PRN Kandis Cocking, MD        Assessment/Plan Lower GI bleed, junction of left and sigmoid colon s/p Extended left/sigmoid colectomy, mobilization of splenic flexure, Hartmann's pouch, end left transverse colon ostomy. 07/12/11 DR. Newman    POD5 Multiple transfusions 6 units PRBC, 2units of platelets, FFP  07/13/11. Lovenox/Heparin on hold Hospitalized 2/2-2/13  with following Dx.: .Candiduria .Diabetes mellitus .Hypertension .Acute renal failure .CKD (chronic kidney disease), stage III .Thrombocytopenia .Pulmonary edema .Acute combined systolic and diastolic congestive heart failure .Hyponatremia  Foley still in. Plan:  I would suggest we transfuse her at least 1 more unit of PRBC's, work on mobilizing her, she is on clears with aspiration precautions.  I will look at meds and see if we are causing some of the sedation (ativan, HS,   MS 2mg  q2h.prn.) Change to lower dose. Will suggest she come off ativan unless medicine thinks she needs it. She needs Ot/PT, but at this point she's to lethargic to do any therapy. I will up her to fulls, and change pain med.  Defer ativan and transfusing to Medicine.    LOS: 5 days    Ranell Skibinski 07/17/2011

## 2011-07-17 NOTE — Progress Notes (Signed)
Subjective: Currently sleeping. No family present.  Objective: Vital signs in last 24 hours: Temp:  [97.3 F (36.3 C)-98.2 F (36.8 C)] 98.1 F (36.7 C) (03/01 1514) Pulse Rate:  [81-96] 81  (03/01 1514) Resp:  [1-18] 1  (03/01 1519) BP: (105-125)/(65-78) 125/77 mmHg (03/01 1514) SpO2:  [100 %] 100 % (03/01 1514) Weight change:  Last BM Date:  (new colostomy)  Intake/Output from previous day: 02/28 0701 - 03/01 0700 In: 240 [P.O.:240] Out: 1050 [Urine:1050] Total I/O In: 60 [P.O.:60] Out: -    Physical Exam: Deferred as patient sleeping   Lab Results: Basic Metabolic Panel:  Basename 07/16/11 0536 07/15/11 0410  NA 143 145  K 4.6 5.5*  CL 114* 116*  CO2 24 22  GLUCOSE 194* 195*  BUN 42* 42*  CREATININE 1.62* 1.59*  CALCIUM 8.0* 7.6*  MG -- 1.8  PHOS -- --    CBC:  Basename 07/17/11 0519 07/16/11 0536 07/15/11 0410  WBC 8.2 10.2 --  NEUTROABS -- -- 8.1*  HGB 7.5* 7.7* --  HCT 23.4* 23.7* --  MCV 92.9 92.6 --  PLT 67* 74* --   CBG:  Basename 07/17/11 1614 07/17/11 1209 07/17/11 0727 07/17/11 0451 07/16/11 2353 07/16/11 2023  GLUCAP 271* 211* 163* 170* 191* 201*     Recent Results (from the past 240 hour(s))  MRSA PCR SCREENING     Status: Normal   Collection Time   07/12/11 10:50 AM      Component Value Range Status Comment   MRSA by PCR NEGATIVE  NEGATIVE  Final     Studies/Results: No results found.  Medications: Scheduled Meds:    . ALPRAZolam  0.25 mg Oral QHS  . antiseptic oral rinse  15 mL Mouth Rinse q12n4p  . chlorhexidine  15 mL Mouth Rinse BID  . digoxin  0.25 mg Intravenous Daily  . famotidine  20 mg Oral QHS  . furosemide  40 mg Intravenous BID  . guaiFENesin  600 mg Oral BID  . insulin aspart  0-15 Units Subcutaneous Q4H  . insulin glargine  10 Units Subcutaneous QHS  . metoprolol tartrate  12.5 mg Oral BID   Continuous Infusions:    . dextrose 5 % and 0.45% NaCl 20 mL/hr at 07/15/11 0245   PRN Meds:.acetaminophen  (TYLENOL) oral liquid 160 mg/5 mL, dextrose, morphine, ondansetron (ZOFRAN) IV, oxyCODONE-acetaminophen, DISCONTD: morphine  Assessment/Plan:  Principal Problem:  *GI bleed Active Problems:  Diabetes mellitus  Hypertension  CKD (chronic kidney disease), stage III  Thrombocytopenia  Chronic systolic heart failure  Diverticulosis of colon with hemorrhage   #1 Lower GI Bleed: s/p left colectomy and colostomy. Per surgery ok to advance to a full liquid diet.  #2 Thrombocytopenia: Platelets continue to Slowly trend down. 67 today. D/w Dr. Cyndie Chime who will followup as an outpatient.  #3 ABLA: Hb stable at 7.5today.  Did not receive blood products yesterday. No plans for transfusion at this point. Recheck CBC in am.  #4 CKD Stage III: Cr at baseline. Follow.  #5 Chronic Systolic CHF: ECHO with EF 20%. Monitor fluid status closely. Continue lasix. Her cardiologist ,Dr. Sharyn Lull, is on board.  #6 Dispo:  Monitor volume status and Hb closely and advance diet as tolerated. Plan for DC home once tolerating solids; hopefully in 48 hours.   LOS: 5 days   [facility] Pager: [phone] 07/17/2011, 5:04 PM

## 2011-07-17 NOTE — Progress Notes (Signed)
Not as awake right now. Daughter reports pt got pain med about 1.5 hr ago.   hgb stable at around 7.5  abd soft, nd, nt, incision c/d/i with wicks. Ostomy with air in bag, edematous but viable. serosang fluid in bag. +BS  Ok to [employer] to full liquids Given age and cardiac hx, consider transfusion 1u prbc Cont to hold heparin/lovenox Can d/c foley from our perspective rec decreasing narcotic dosage rec stopping xanax unless pt is on it chronically Mobilize Pt/ot  [name]. Andrey Campanile, MD, FACS General, Bariatric, & Minimally Invasive Surgery [facility], [place]

## 2011-07-17 NOTE — Progress Notes (Signed)
Subjective:  Sleepy. Tolerated feedings.  Objective:  Vital Signs in the last 24 hours: Temp:  [97.3 F (36.3 C)-98.2 F (36.8 C)] 97.3 F (36.3 C) (03/01 1016) Pulse Rate:  [87-96] 89  (03/01 1016) Cardiac Rhythm:  [-]  Resp:  [16-18] 18  (03/01 1016) BP: (105-123)/(65-78) 123/78 mmHg (03/01 1016) SpO2:  [100 %] 100 % (03/01 1016)  Physical Exam: BP Readings from Last 1 Encounters:  07/17/11 123/78    Wt Readings from Last 1 Encounters:  07/15/11 67.4 kg (148 lb 9.4 oz)    Weight change:   HEENT: [place]/AT, Eyes-Brown, PERL, EOMI, Conjunctiva-Pale, Sclera-Non-icteric Neck: No JVD, No bruit, Trachea midline. Lungs:  Clear, Bilateral. Cardiac:  Regular rhythm, normal S1 and S2, no S3. II/VI systolic murmur. Abdomen:  Soft, non-tender. Extremities:  No edema present. No cyanosis. No clubbing. CNS: AxOx3, Cranial nerves grossly intact, moves all 4 extremities. Right handed. Skin: Warm and dry.   Intake/Output from previous day: 02/28 0701 - 03/01 0700 In: 240 [P.O.:240] Out: 1050 [Urine:1050]    Lab Results: BMET    Component Value Date/Time   NA 143 07/16/2011 0536   K 4.6 07/16/2011 0536   CL 114* 07/16/2011 0536   CO2 24 07/16/2011 0536   GLUCOSE 194* 07/16/2011 0536   BUN 42* 07/16/2011 0536   CREATININE 1.62* 07/16/2011 0536   CALCIUM 8.0* 07/16/2011 0536   GFRNONAA 28* 07/16/2011 0536   GFRAA 33* 07/16/2011 0536   CBC    Component Value Date/Time   WBC 8.2 07/17/2011 0519   WBC 9.1 08/23/2009 0819   RBC 2.52* 07/17/2011 0519   RBC 4.42 08/23/2009 0819   HGB 7.5* 07/17/2011 0519   HGB 12.6 08/23/2009 0819   HCT 23.4* 07/17/2011 0519   HCT 38.4 08/23/2009 0819   PLT 67* 07/17/2011 0519   PLT 243 08/23/2009 0819   MCV 92.9 07/17/2011 0519   MCV 86.8 08/23/2009 0819   MCH 29.8 07/17/2011 0519   MCH 28.4 08/23/2009 0819   MCHC 32.1 07/17/2011 0519   MCHC 32.7 08/23/2009 0819   RDW 19.6* 07/17/2011 0519   RDW 16.1* 08/23/2009 0819   LYMPHSABS 2.2 07/15/2011 0410   LYMPHSABS 1.4 08/23/2009 0819   MONOABS 1.1* 07/15/2011 0410   MONOABS 0.7 08/23/2009 0819   EOSABS 0.1 07/15/2011 0410   EOSABS 0.3 08/23/2009 0819   BASOSABS 0.1 07/15/2011 0410   BASOSABS 0.0 08/23/2009 0819   CARDIAC ENZYMES Lab Results  Component Value Date   CKTOTAL 106 07/12/2011   CKMB 4.6* 07/12/2011   TROPONINI <0.30 07/12/2011    Assessment/Plan:  Patient Active Hospital Problem List:  Acute Lower GI bleed  S/P left hemicolectomy and Colostomy.  Anemia of blood loss  Diabetes mellitus (06/20/2011) CKD (chronic kidney disease), stage III (06/25/2011) Thrombocytopenia (06/25/2011) Hypertension (06/20/2011) Chronic systolic heart failure (07/12/2011)  Hyperkalemia-Resolved  Follow with surgery.   LOS: 5 days    Orpah Cobb  MD  07/17/2011, 2:35 PM

## 2011-07-17 NOTE — Progress Notes (Signed)
Physical Therapy Treatment Patient Details Name: [name] MRN: [id number] DOB: 02-Jul-1925 Today's Date: 07/17/2011  PT Assessment/Plan  PT - Assessment/Plan Comments on Treatment Session: Pt is groggy, possibly because she had pain medicine earlier this morning and also has low Hgb; requires +2 total assist for mobility. PT Plan: Discharge plan remains appropriate PT Frequency: Min 3X/week Follow Up Recommendations: Supervision/Assistance - 24 hour;Skilled nursing facility;Home health PT (at present, pt will require SNF level of care) Equipment Recommended:  (?hospital bed if DC home) PT Goals  Acute Rehab PT Goals PT Goal Formulation: With patient/family Time For Goal Achievement: 2 weeks Pt will go Supine/Side to Sit: with min assist PT Goal: Supine/Side to Sit - Progress: Progressing toward goal Pt will go Sit to Stand: with mod assist PT Goal: Sit to Stand - Progress: Progressing toward goal Pt will go Stand to Sit: with min assist PT Goal: Stand to Sit - Progress: Progressing toward goal Pt will Ambulate: 16 - 50 feet;with rolling walker;with mod assist PT Goal: Ambulate - Progress: Not progressing Pt will Perform Home Exercise Program: with supervision, verbal cues required/provided PT Goal: Perform Home Exercise Program - Progress: Not met  PT Treatment Precautions/Restrictions  Precautions Precautions:  (colostomy bag and abdomenal incision) Required Braces or Orthoses: No Restrictions Weight Bearing Restrictions: No Mobility (including Balance) Bed Mobility Bed Mobility: Yes Rolling Right: 1: +2 Total assist;With rail Rolling Right Details (indicate cue type and reason): pt 25% Supine to Sit: 1: +2 Total assist;HOB elevated (Comment degrees) (HOB 20*) Supine to Sit Details (indicate cue type and reason): pt 25%, assist to initiate movement, elevate trunk Sitting - Scoot to Edge of Bed: 2: Max assist Sitting - Scoot to [place] of Bed Details (indicate cue type and  reason): pt 40% Transfers Transfers: Yes Sit to Stand: 1: +2 Total assist;From bed Sit to Stand Details (indicate cue type and reason): pt 25%, assist to clear bed, pt unable to achieve full upright position, trunk flexed in standing with posterior lean Stand to Sit: To chair/3-in-1;1: +2 Total assist;With armrests Stand to Sit Details: pt 50%, assist to control descent Stand Pivot Transfers: 1: +2 Total assist Stand Pivot Transfer Details (indicate cue type and reason): pt 25%, poor balance in standing Ambulation/Gait Ambulation/Gait: No  Posture/Postural Control Posture/Postural Control: Postural limitations Postural Limitations: flexed neck and trunk in sit and stand, posterior lean in standing Balance Balance Assessed: Yes Static Sitting Balance Static Sitting - Balance Support: Right upper extremity supported;Left upper extremity supported Static Sitting - Level of Assistance: 3: Mod assist Static Sitting - Comment/# of Minutes: 4 minutes; leans posteriorly Exercise    End of Session PT - End of Session Equipment Utilized During Treatment: Gait belt Activity Tolerance: Patient limited by fatigue Patient left: in chair;with call bell in reach;with family/visitor present Nurse Communication: Mobility status for transfers General Behavior During Session: Lethargic Cognition: Impaired, at baseline  Tamala Ser 07/17/2011, 10:58 AM [phone]

## 2011-07-18 LAB — CBC
Hemoglobin: 8.5 g/dL — ABNORMAL LOW (ref 12.0–15.0)
MCH: 29.3 pg (ref 26.0–34.0)
MCV: 92.1 fL (ref 78.0–100.0)
RBC: 2.9 MIL/uL — ABNORMAL LOW (ref 3.87–5.11)

## 2011-07-18 LAB — GLUCOSE, CAPILLARY
Glucose-Capillary: 192 mg/dL — ABNORMAL HIGH (ref 70–99)
Glucose-Capillary: 142 mg/dL — ABNORMAL HIGH (ref 70–99)
Glucose-Capillary: 271 mg/dL — ABNORMAL HIGH (ref 70–99)
Glucose-Capillary: 326 mg/dL — ABNORMAL HIGH (ref 70–99)
Glucose-Capillary: 316 mg/dL — ABNORMAL HIGH (ref 70–99)

## 2011-07-18 MED ORDER — INSULIN ASPART 100 UNIT/ML ~~LOC~~ SOLN
0.0000 [IU] | Freq: Three times a day (TID) | SUBCUTANEOUS | Status: DC
  Administered 2011-07-18: 11 [IU] via SUBCUTANEOUS
  Administered 2011-07-19: 3 [IU] via SUBCUTANEOUS
  Administered 2011-07-19: 8 [IU] via SUBCUTANEOUS
  Administered 2011-07-19: 11 [IU] via SUBCUTANEOUS
  Administered 2011-07-20: 3 [IU] via SUBCUTANEOUS
  Administered 2011-07-20: 5 [IU] via SUBCUTANEOUS
  Filled 2011-07-18: qty 3

## 2011-07-18 NOTE — Progress Notes (Signed)
6 Days Post-Op  Subjective: Sleeping.  Able to be aroused.  Objective: Vital signs in last 24 hours: Temp:  [97.3 F (36.3 C)-99 F (37.2 C)] 97.6 F (36.4 C) (03/02 0550) Pulse Rate:  [81-95] 95  (03/02 0550) Resp:  [1-18] 18  (03/02 0550) BP: (108-144)/(62-81) 144/81 mmHg (03/02 0550) SpO2:  [98 %-100 %] 100 % (03/02 0550) Last BM Date:  (new colostomy)  Intake/Output from previous day: 03/01 0701 - 03/02 0700 In: 60 [P.O.:60] Out: 1175 [Urine:1175] Intake/Output this shift:    PE: Abd-soft, wound clean and wicks removed, colostomy is edematous with some thin bloody liquid and gas in bag  Lab Results:   Basename 07/18/11 0830 07/17/11 0519  WBC 8.7 8.2  HGB 8.5* 7.5*  HCT 26.7* 23.4*  PLT 84* 67*   BMET  Basename 07/16/11 0536  NA 143  K 4.6  CL 114*  CO2 24  GLUCOSE 194*  BUN 42*  CREATININE 1.62*  CALCIUM 8.0*   PT/INR No results found for this basename: LABPROT:2,INR:2 in the last 72 hours Comprehensive Metabolic Panel:    Component Value Date/Time   NA 143 07/16/2011 0536   K 4.6 07/16/2011 0536   CL 114* 07/16/2011 0536   CO2 24 07/16/2011 0536   BUN 42* 07/16/2011 0536   CREATININE 1.62* 07/16/2011 0536   GLUCOSE 194* 07/16/2011 0536   CALCIUM 8.0* 07/16/2011 0536   AST 33 07/14/2011 0804   ALT 52* 07/14/2011 0804   ALKPHOS 75 07/14/2011 0804   BILITOT 1.1 07/14/2011 0804   PROT 4.5* 07/14/2011 0804   ALBUMIN 1.9* 07/14/2011 0804     Studies/Results: No results found.  Anti-infectives: Anti-infectives     Start     Dose/Rate Route Frequency Ordered Stop   07/13/11 0000   cefOXitin (MEFOXIN) 1 g in dextrose 5 % 50 mL IVPB        1 g 100 mL/hr over 30 Minutes Intravenous Every 6 hours 07/12/11 2133 07/13/11 1237          Assessment Principal Problem:  *GI bleed Active Problems:  Diabetes mellitus  Hypertension  CKD (chronic kidney disease), stage III  Thrombocytopenia-slightly improved  Chronic systolic heart failure  Diverticulosis  of colon with hemorrhage s/p left colectomy and colostomy 07/11/11  ABL anemia    LOS: 6 days   Plan: Solid CHO modified diet.   Jesselle Laflamme J 07/18/2011

## 2011-07-18 NOTE — Progress Notes (Signed)
Subjective: Currently eating solid lunch. Daughter is present and feeding her.  Objective: Vital signs in last 24 hours: Temp:  [97.6 F (36.4 C)-99 F (37.2 C)] 98.4 F (36.9 C) (03/02 1100) Pulse Rate:  [81-99] 99  (03/02 1100) Resp:  [1-18] 18  (03/02 1100) BP: (108-144)/(62-81) 134/69 mmHg (03/02 1100) SpO2:  [98 %-100 %] 100 % (03/02 1100) Weight change:  Last BM Date:  (new colostomy)  Intake/Output from previous day: 03/01 0701 - 03/02 0700 In: 60 [P.O.:60] Out: 1175 [Urine:1175] Total I/O In: 120 [P.O.:120] Out: 250 [Urine:250]   Physical Exam: General: Alert, awake, oriented x3, in no acute distress.  HEENT: No bruits, no goiter.  Heart: Regular rate and rhythm, without murmurs, rubs, gallops.  Lungs: Clear to auscultation bilaterally.  Abdomen: Soft, nontender, nondistended, positive bowel sounds.  Extremities: 1-2+ edema.    Lab Results: Basic Metabolic Panel:  [facility] 07/16/11 0536  NA 143  K 4.6  CL 114*  CO2 24  GLUCOSE 194*  BUN 42*  CREATININE 1.62*  CALCIUM 8.0*  MG --  PHOS --    CBC:  Basename 07/18/11 0830 07/17/11 0519  WBC 8.7 8.2  NEUTROABS -- --  HGB 8.5* 7.5*  HCT 26.7* 23.4*  MCV 92.1 92.9  PLT 84* 67*   CBG:  Basename 07/18/11 1202 07/18/11 0819 07/18/11 0346 07/18/11 0026 07/17/11 2017 07/17/11 1614  GLUCAP 271* 142* 192* 211* 254* 271*     Recent Results (from the past 240 hour(s))  MRSA PCR SCREENING     Status: Normal   Collection Time   07/12/11 10:50 AM      Component Value Range Status Comment   MRSA by PCR NEGATIVE  NEGATIVE  Final     Studies/Results: No results found.  Medications: Scheduled Meds:    . ALPRAZolam  0.25 mg Oral QHS  . antiseptic oral rinse  15 mL Mouth Rinse q12n4p  . chlorhexidine  15 mL Mouth Rinse BID  . digoxin  0.25 mg Intravenous Daily  . famotidine  20 mg Oral QHS  . furosemide  40 mg Intravenous BID  . guaiFENesin  600 mg Oral BID  . insulin aspart  0-15 Units  Subcutaneous Q4H  . insulin glargine  10 Units Subcutaneous QHS  . metoprolol tartrate  12.5 mg Oral BID   Continuous Infusions:    . dextrose 5 % and 0.45% NaCl 20 mL/hr at 07/15/11 0245   PRN Meds:.acetaminophen (TYLENOL) oral liquid 160 mg/5 mL, dextrose, morphine, ondansetron (ZOFRAN) IV, oxyCODONE-acetaminophen  Assessment/Plan:  Principal Problem:  *GI bleed Active Problems:  Diabetes mellitus  Hypertension  CKD (chronic kidney disease), stage III  Thrombocytopenia  Chronic systolic heart failure  Diverticulosis of colon with hemorrhage s/p left colectomy and colostomy 07/11/11   #1 Lower GI Bleed: s/p left colectomy and colostomy. Per surgery ok to advance to a solid diet.  #2 Thrombocytopenia: Platelets increased to 84 today. D/w Dr. Cyndie Chime who will followup as an outpatient.  #3 ABLA: Hb slowly drifting upwards.  Did not receive blood products yesterday. No plans for transfusion at this point. Recheck CBC in am.  #4 CKD Stage III: Cr at baseline. Follow.  #5 Chronic Systolic CHF: ECHO with EF 20%. Monitor fluid status closely. Continue lasix. Her cardiologist ,Dr. Sharyn Lull, is on board.  #6 Dispo:  Monitor volume status and Hb closely and advance diet as tolerated. Plan for DC home once tolerating solids; hopefully in am.   LOS: 6 days   HERNANDEZ ACOSTA,Mylinh Cragg  [facility] Pager: [phone] 07/18/2011, 2:22 PM

## 2011-07-18 NOTE — Progress Notes (Signed)
Speech Language/Pathology Speech Pathology: Dysphagia Treatment Note 1025-1050  Patient was observed with : Regular (pt with slow mastication of cracker but no significant stasis - that is baseline for her per family)  Pt also has partials at home that she wears to eat - SLP asked Myrene Buddy to bring these in for pt to use during meals.   Due to weakness with weak cough/phonation and pt's bedbound status, recommend continue STRICT aspiration precautions.     Patient was noted to have s/s of aspiration : No:    Lung Sounds:  decreased Temperature: afebrile Last CXR 07/14/11  Daughters Myrene Buddy and Steward Drone present and report pt enjoyed consumption of oatmeal today without problems managing- family denies pt swallowing different than prior to last hospitalization.  Educated family to aspiration precautions, diet modifications, compensatory strategies and multiple risk factors for asp pneumonia.   Also provided Myrene Buddy with sample packet of thickener to use if note pt overtly coughing with thin liquids-but not with her Glucerna.  Further advised Myrene Buddy that when dysphagia progresses to aspiration is occuring with all intake, thin water will be better absorbed by the lungs than thickener.    As this pt is frail and elderly, anticipate variance in swallow ability.  Daughter Steward Drone left at 1030, but prior to departure verbalized strategies SLP had reviewed during initial eval without cues.    Patient required:  Pt requires total assist to feed due to weakness, pt naturally takes small bites/sips without verbal cues.    Clinical Impression: Chronic dysphagia for which family manages well.    Recommendations:  Due to weakness with weak cough/phonation and pt's bedbound status, recommend continue STRICT aspiration precautions with current diet.  Family member is always present with pt, therefore agree diet liberalization with familiar feeders and precautions is appropriate.   Daughter Myrene Buddy also reports pt's  phonatory strength is consistent with previous hospitalization.      Pain:   none Intervention Required:   No  Goals: All Goals Met   Donavan Burnet, MS [facility] SLP [phone]

## 2011-07-19 LAB — CBC
Hemoglobin: 8 g/dL — ABNORMAL LOW (ref 12.0–15.0)
MCHC: 32.5 g/dL (ref 30.0–36.0)
Platelets: 89 10*3/uL — ABNORMAL LOW (ref 150–400)
RDW: 18.3 % — ABNORMAL HIGH (ref 11.5–15.5)

## 2011-07-19 MED ORDER — INSULIN GLARGINE 100 UNIT/ML ~~LOC~~ SOLN
20.0000 [IU] | Freq: Every day | SUBCUTANEOUS | Status: DC
  Administered 2011-07-19: 20 [IU] via SUBCUTANEOUS
  Filled 2011-07-19: qty 3

## 2011-07-19 MED ORDER — SODIUM CHLORIDE 0.9 % IV SOLN: INTRAVENOUS | Status: DC

## 2011-07-19 MED ORDER — LACTATED RINGERS IV SOLN: INTRAVENOUS | Status: DC

## 2011-07-19 MED ORDER — SODIUM CHLORIDE 0.9 % IV SOLN
INTRAVENOUS | Status: DC
  Administered 2011-07-19: 20 mL via INTRAVENOUS
  Administered 2011-07-20: 20 mL/h via INTRAVENOUS

## 2011-07-19 NOTE — Plan of Care (Signed)
Problem: Phase I Progression Outcomes Goal: Tubes/drains patent Outcome: Progressing Foley intact

## 2011-07-19 NOTE — Plan of Care (Signed)
Problem: Phase I Progression Outcomes Goal: OOB as tolerated unless otherwise ordered Outcome: Progressing OOB sitting up in chair x 3hours

## 2011-07-19 NOTE — Progress Notes (Signed)
7 Days Post-Op  Subjective: No complaints.  Objective: Vital signs in last 24 hours: Temp:  [97.6 F (36.4 C)-99.8 F (37.7 C)] 99.8 F (37.7 C) (03/03 0515) Pulse Rate:  [85-99] 89  (03/03 0515) Resp:  [16-18] 16  (03/03 0515) BP: (126-145)/(69-79) 139/75 mmHg (03/03 0515) SpO2:  [100 %] 100 % (03/03 0515) Last BM Date: 07/18/11  Intake/Output from previous day: 03/02 0701 - 03/03 0700 In: 240 [P.O.:240] Out: 2100 [Urine:2100] Intake/Output this shift:    PE: Abd-soft, incision clean and dry, some dark bloody stoma output  Lab Results:   Basename 07/19/11 0435 07/18/11 0830  WBC 7.5 8.7  HGB 8.0* 8.5*  HCT 24.6* 26.7*  PLT 89* 84*   BMET No results found for this basename: NA:2,K:2,CL:2,CO2:2,GLUCOSE:2,BUN:2,CREATININE:2,CALCIUM:2 in the last 72 hours PT/INR No results found for this basename: LABPROT:2,INR:2 in the last 72 hours Comprehensive Metabolic Panel:    Component Value Date/Time   NA 143 07/16/2011 0536   K 4.6 07/16/2011 0536   CL 114* 07/16/2011 0536   CO2 24 07/16/2011 0536   BUN 42* 07/16/2011 0536   CREATININE 1.62* 07/16/2011 0536   GLUCOSE 194* 07/16/2011 0536   CALCIUM 8.0* 07/16/2011 0536   AST 33 07/14/2011 0804   ALT 52* 07/14/2011 0804   ALKPHOS 75 07/14/2011 0804   BILITOT 1.1 07/14/2011 0804   PROT 4.5* 07/14/2011 0804   ALBUMIN 1.9* 07/14/2011 0804     Studies/Results: No results found.  Anti-infectives: Anti-infectives     Start     Dose/Rate Route Frequency Ordered Stop   07/13/11 0000   cefOXitin (MEFOXIN) 1 g in dextrose 5 % 50 mL IVPB        1 g 100 mL/hr over 30 Minutes Intravenous Every 6 hours 07/12/11 2133 07/13/11 1237          Assessment Principal Problem:  *GI bleed Active Problems:  Diabetes mellitus  Hypertension  CKD (chronic kidney disease), stage III  Thrombocytopenia-a little better  Chronic systolic heart failure  Diverticulosis of colon with hemorrhage s/p left colectomy and colostomy 07/11/11-making slow  progress    LOS: 7 days   Plan: Discharge planning discussed with daughter.  Initial plans were to dc home with [facility] services.  Will see how she does early this week.  May need a SNF if family not comfortable with the care that is needed.  Discussed with her dtr, Lowella Dell.   Lezlee Gills J 07/19/2011

## 2011-07-19 NOTE — Progress Notes (Signed)
Subjective: No complaints. Tolerating solids. Still no significant BM. Daughter [name] present and updated.  Objective: Vital signs in last 24 hours: Temp:  [97.6 F (36.4 C)-99.8 F (37.7 C)] 99.8 F (37.7 C) (03/03 0515) Pulse Rate:  [85-96] 96  (03/03 0921) Resp:  [16-18] 16  (03/03 0515) BP: (126-145)/(63-79) 142/63 mmHg (03/03 0921) SpO2:  [100 %] 100 % (03/03 0515) Weight change:  Last BM Date: 07/18/11  Intake/Output from previous day: 03/02 0701 - 03/03 0700 In: 240 [P.O.:240] Out: 2100 [Urine:2100]     Physical Exam: General: Alert, awake, oriented x3, in no acute distress.  HEENT: No bruits, no goiter.  Heart: Regular rate and rhythm, without murmurs, rubs, gallops.  Lungs: Clear to auscultation bilaterally.  Abdomen: Soft, nontender, nondistended, positive bowel sounds, colostomy in place. Extremities: 1-2+ edema.    Lab Results:  CBC:  Basename 07/19/11 0435 07/18/11 0830  WBC 7.5 8.7  NEUTROABS -- --  HGB 8.0* 8.5*  HCT 24.6* 26.7*  MCV 90.8 92.1  PLT 89* 84*   CBG:  Basename 07/19/11 0739 07/18/11 2043 07/18/11 1714 07/18/11 1202 07/18/11 0819 07/18/11 0346  GLUCAP 175* 316* 326* 271* 142* 192*     Recent Results (from the past 240 hour(s))  MRSA PCR SCREENING     Status: Normal   Collection Time   07/12/11 10:50 AM      Component Value Range Status Comment   MRSA by PCR NEGATIVE  NEGATIVE  Final     Studies/Results: No results found.  Medications: Scheduled Meds:    . ALPRAZolam  0.25 mg Oral QHS  . antiseptic oral rinse  15 mL Mouth Rinse q12n4p  . chlorhexidine  15 mL Mouth Rinse BID  . digoxin  0.25 mg Intravenous Daily  . famotidine  20 mg Oral QHS  . furosemide  40 mg Intravenous BID  . guaiFENesin  600 mg Oral BID  . insulin aspart  0-15 Units Subcutaneous TID WC  . insulin glargine  10 Units Subcutaneous QHS  . metoprolol tartrate  12.5 mg Oral BID  . DISCONTD: insulin aspart  0-15 Units Subcutaneous Q4H   Continuous  Infusions:    . dextrose 5 % and 0.45% NaCl 20 mL/hr (07/19/11 0819)   PRN Meds:.acetaminophen (TYLENOL) oral liquid 160 mg/5 mL, dextrose, morphine, ondansetron (ZOFRAN) IV, oxyCODONE-acetaminophen  Assessment/Plan:  Principal Problem:  *GI bleed Active Problems:  Diabetes mellitus  Hypertension  CKD (chronic kidney disease), stage III  Thrombocytopenia  Chronic systolic heart failure  Diverticulosis of colon with hemorrhage s/p left colectomy and colostomy 07/11/11   #1 Lower GI Bleed: s/p left colectomy and colostomy. Tolerating solid diet without nausea or abdominal pain.  #2 Thrombocytopenia: Platelets increased to 89 today. D/w Dr. Cyndie Chime who will followup as an outpatient.  #3 ABLA: Hb stable.  Did not receive blood products yesterday. No plans for transfusion at this point. Recheck CBC in am.  #4 CKD Stage III: Cr at baseline. Follow.  #5 Chronic Systolic CHF: ECHO with EF 20%. Monitor fluid status closely. Continue lasix. Her cardiologist ,Dr. Sharyn Lull, is on board.  #6 Dispo:  Monitor volume status and Hb closely and advance diet as tolerated. Surgery would like to see her having a BM prior to DC. Per daughter, she works in the am and it would be difficult for them to take her back until after 6pm Monday.   LOS: 7 days   [name],[name] [facility] Pager: [phone] 07/19/2011, 11:27 AM

## 2011-07-19 NOTE — Plan of Care (Signed)
Problem: Phase I Progression Outcomes Goal: Sutures/staples intact Outcome: Progressing intact

## 2011-07-19 NOTE — Progress Notes (Signed)
Subjective:  No chest pain. Awake. Tolerated PO feedings.  Objective:  Vital Signs in the last 24 hours: Temp:  [97.6 F (36.4 C)-99.8 F (37.7 C)] 99.8 F (37.7 C) (03/03 0515) Pulse Rate:  [85-99] 89  (03/03 0515) Cardiac Rhythm:  [-]  Resp:  [16-18] 16  (03/03 0515) BP: (126-145)/(69-79) 139/75 mmHg (03/03 0515) SpO2:  [100 %] 100 % (03/03 0515)  Physical Exam: BP Readings from Last 1 Encounters:  07/19/11 139/75    Wt Readings from Last 1 Encounters:  07/15/11 67.4 kg (148 lb 9.4 oz)    Weight change:   HEENT: [place]/AT, Eyes-Brown, PERL, EOMI, Conjunctiva-Pale, Sclera-Non-icteric Neck: No JVD, No bruit, Trachea midline. Lungs:  Clear, Bilateral. Cardiac:  Regular rhythm, normal S1 and S2, no S3. II/VI systolic murmur at left sternal border. Abdomen:  Soft, non-tender. Extremities:  No edema present. No cyanosis. No clubbing. CNS: AxOx3, Cranial nerves grossly intact, moves all 4 extremities. Right handed. Skin: Warm and dry.   Intake/Output from previous day: 03/02 0701 - 03/03 0700 In: 240 [P.O.:240] Out: 2100 [Urine:2100]    Lab Results: BMET    Component Value Date/Time   NA 143 07/16/2011 0536   K 4.6 07/16/2011 0536   CL 114* 07/16/2011 0536   CO2 24 07/16/2011 0536   GLUCOSE 194* 07/16/2011 0536   BUN 42* 07/16/2011 0536   CREATININE 1.62* 07/16/2011 0536   CALCIUM 8.0* 07/16/2011 0536   GFRNONAA 28* 07/16/2011 0536   GFRAA 33* 07/16/2011 0536   CBC    Component Value Date/Time   WBC 7.5 07/19/2011 0435   WBC 9.1 08/23/2009 0819   RBC 2.71* 07/19/2011 0435   RBC 4.42 08/23/2009 0819   HGB 8.0* 07/19/2011 0435   HGB 12.6 08/23/2009 0819   HCT 24.6* 07/19/2011 0435   HCT 38.4 08/23/2009 0819   PLT 89* 07/19/2011 0435   PLT 243 08/23/2009 0819   MCV 90.8 07/19/2011 0435   MCV 86.8 08/23/2009 0819   MCH 29.5 07/19/2011 0435   MCH 28.4 08/23/2009 0819   MCHC 32.5 07/19/2011 0435   MCHC 32.7 08/23/2009 0819   RDW 18.3* 07/19/2011 0435   RDW 16.1* 08/23/2009 0819   LYMPHSABS 2.2 07/15/2011  0410   LYMPHSABS 1.4 08/23/2009 0819   MONOABS 1.1* 07/15/2011 0410   MONOABS 0.7 08/23/2009 0819   EOSABS 0.1 07/15/2011 0410   EOSABS 0.3 08/23/2009 0819   BASOSABS 0.1 07/15/2011 0410   BASOSABS 0.0 08/23/2009 0819   CARDIAC ENZYMES Lab Results  Component Value Date   CKTOTAL 106 07/12/2011   CKMB 4.6* 07/12/2011   TROPONINI <0.30 07/12/2011    Assessment/Plan:  Patient Active Hospital Problem List:  Acute Lower GI bleed  S/P left hemicolectomy and Colostomy.  Anemia of blood loss  Diabetes mellitus (06/20/2011) CKD (chronic kidney disease), stage III (06/25/2011) Thrombocytopenia (06/25/2011) Hypertension (06/20/2011) Chronic systolic heart failure (07/12/2011)  Hyperkalemia-Resolved  Home soon, F/U in 3-4 weeks.   LOS: 7 days    Orpah Cobb  MD  07/19/2011, 9:20 AM

## 2011-07-20 ENCOUNTER — Encounter (HOSPITAL_COMMUNITY): Payer: Self-pay | Admitting: Surgery

## 2011-07-20 MED ORDER — OXYCODONE-ACETAMINOPHEN 5-325 MG PO TABS: 1.0000 | ORAL_TABLET | ORAL | Status: DC | PRN

## 2011-07-20 MED ORDER — INSULIN GLARGINE 100 UNIT/ML ~~LOC~~ SOLN: 10.0000 [IU] | Freq: Every day | SUBCUTANEOUS | Status: DC

## 2011-07-20 NOTE — Discharge Instructions (Signed)
[facility]      [facility] [phone]  OPEN ABDOMINAL SURGERY: POST OP INSTRUCTIONS  Always review your discharge instruction sheet given to you by the facility where your surgery was performed.  IF YOU HAVE DISABILITY OR FAMILY LEAVE FORMS, YOU MUST BRING THEM TO THE OFFICE FOR PROCESSING.  PLEASE DO NOT GIVE THEM TO YOUR DOCTOR.  1. A prescription for pain medication may be given to you upon discharge.  Take your pain medication as prescribed, if needed.  If narcotic pain medicine is not needed, then you may take acetaminophen (Tylenol) or ibuprofen (Advil) as needed. 2. Take your usually prescribed medications unless otherwise directed. 3. If you need a refill on your pain medication, please contact your pharmacy. They will contact our office to request authorization.  Prescriptions will not be filled after 5pm or on week-ends. 4. You should follow a light diet the first few days after arrival home, such as soup and crackers, pudding, etc.unless your doctor has advised otherwise. A high-fiber, low fat diet can be resumed as tolerated.   Be sure to include lots of fluids daily. Most patients will experience some swelling and bruising on the chest and neck area.  Ice packs will help.  Swelling and bruising can take several days to resolve 5. Most patients will experience some swelling and bruising in the area of the incision. Ice pack will help. Swelling and bruising can take several days to resolve..  6. It is common to experience some constipation if taking pain medication after surgery.  Increasing fluid intake and taking a stool softener will usually help or prevent this problem from occurring.  A mild laxative (Milk of Magnesia or Miralax) should be taken according to package directions if there are no bowel movements after 48 hours. 7.  You may have steri-strips (small skin tapes) in place directly over the incision.  These strips should be left on the skin for 7-10 days.  If your  surgeon used skin glue on the incision, you may shower in 24 hours.  The glue will flake off over the next 2-3 weeks.  Any sutures or staples will be removed at the office during your follow-up visit. You may find that a light gauze bandage over your incision may keep your staples from being rubbed or pulled. You may shower and replace the bandage daily. 8. ACTIVITIES:  You may resume regular (light) daily activities beginning the next day--such as daily self-care, walking, climbing stairs--gradually increasing activities as tolerated.  You may have sexual intercourse when it is comfortable.  Refrain from any heavy lifting or straining until approved by your doctor. a. You may drive when you no longer are taking prescription pain medication, you can comfortably wear a seatbelt, and you can safely maneuver your car and apply brakes b. Return to Work: ___________________________________ 9. You should see your doctor in the office for a follow-up appointment approximately two weeks after your surgery.  Make sure that you call for this appointment within a day or two after you arrive home to insure a convenient appointment time. OTHER INSTRUCTIONS:  _____________________________________________________________ _____________________________________________________________  WHEN TO CALL YOUR DOCTOR: 1. Fever over 101.0 2. Inability to urinate 3. Nausea and/or vomiting 4. Extreme swelling or bruising 5. Continued bleeding from incision. 6. Increased pain, redness, or drainage from the incision. 7. Difficulty swallowing or breathing 8. Muscle cramping or spasms. 9. Numbness or tingling in hands or feet or around lips.  The clinic staff is available to   answer your questions during regular business hours.  Please don't hesitate to call and ask to speak to one of the nurses if you have concerns.  For further questions, please visit [email or web address]   

## 2011-07-20 NOTE — Progress Notes (Signed)
Physical Therapy Treatment Patient Details Name: [name] MRN: [id number] DOB: 26-Dec-1925 Today's Date: 07/20/2011  PT Assessment/Plan  PT - Assessment/Plan Comments on Treatment Session: Pt with very flat affect and unable to respond to verbal commands during session. RN aware and states that has been her normal lately. Sat pt on EOB to attempt sitting balance activities, however, pt unable to participate.  Sat supported by PT on EOB approx . Pt to D/C home today, however, continue to recommend SNF for pt to increase pt safety and decrease BOC.   PT Plan: Discharge plan remains appropriate PT Frequency: Min 3X/week Follow Up Recommendations: Supervision/Assistance - 24 hour;Skilled nursing facility;Home health PT Equipment Recommended: None recommended by OT;None recommended by PT PT Goals  Acute Rehab PT Goals PT Goal Formulation: With patient/family Time For Goal Achievement: 2 weeks Pt will go Supine/Side to Sit: with min assist PT Goal: Supine/Side to Sit - Progress: Not progressing Pt will go Sit to Supine/Side: with min assist PT Goal: Sit to Supine/Side - Progress: Not progressing  PT Treatment Precautions/Restrictions  Precautions Precautions: Fall Required Braces or Orthoses: No Restrictions Weight Bearing Restrictions: No Mobility (including Balance) Bed Mobility Bed Mobility: Yes Rolling Right: 1: +2 Total assist Rolling Right Details (indicate cue type and reason): Pt assist 0%.  Unable to follow commands, +2 total assist.  Rolling Left: 1: +2 Total assist Rolling Left Details (indicate cue type and reason): Pt 0%.  Unable to follow commands for rolling.  Supine to Sit: 1: +2 Total assist;Patient percentage (comment) Supine to Sit Details (indicate cue type and reason): Continues to require max encouragement, however pt is unable to follow commands.    Sitting - Scoot to Edge of Bed: 1: +2 Total assist Sitting - Scoot to Edge of Bed Details (indicate cue  type and reason): Pt 0%. Used pad under pt to scoot forward.  Sit to Supine: 1: +2 Total assist;Patient percentage (comment);HOB flat Sit to Supine - Details (indicate cue type and reason): Pt assist 0%. Pt unable to follow any commands.  Transfers Transfers: No    Exercise    End of Session PT - End of Session Patient left: in bed;with call bell in reach;with bed alarm set Nurse Communication: Mobility status for transfers General Behavior During Session: Flat affect Cognition: Impaired, at baseline  Lessie Dings 07/20/2011, 3:12 PM

## 2011-07-20 NOTE — Progress Notes (Signed)
Pt appetite is fair. She doesn't open her eyes very often. Colostomy bag emptied x2, it was full of liquid brown stool. Foley was dc'd 1530. Family in to take pt. Home discharge instructions given to family. IV team in to DC IJ. Pt is a two person assist to transfer.

## 2011-07-20 NOTE — Progress Notes (Signed)
8 Days Post-Op  Subjective: Pt ok. On Carb Mod diet  Objective: Vital signs in last 24 hours: Temp:  [97.5 F (36.4 C)-99.1 F (37.3 C)] 99.1 F (37.3 C) (03/04 1040) Pulse Rate:  [70-97] 97  (03/04 1040) Resp:  [14-16] 16  (03/04 1040) BP: (125-140)/(66-79) 139/74 mmHg (03/04 1040) SpO2:  [100 %] 100 % (03/04 1040) Last BM Date: 07/19/11  Intake/Output this shift:    Physical Exam: BP 139/74  Pulse 97  Temp(Src) 99.1 F (37.3 C) (Oral)  Resp 16  Ht 5\' 3"  (1.6 m)  Wt 67.4 kg (148 lb 9.4 oz)  BMI 26.32 kg/m2  SpO2 100% Abdomen: soft, ND, NT Ostomy pink and viable, pasty brown stool output.  Labs: CBC  Basename 07/19/11 0435 07/18/11 0830  WBC 7.5 8.7  HGB 8.0* 8.5*  HCT 24.6* 26.7*  PLT 89* 84*   BMET No results found for this basename: NA:2,K:2,CL:2,CO2:2,GLUCOSE:2,BUN:2,CREATININE:2,CALCIUM:2 in the last 72 hours LFT No results found for this basename: PROT,ALBUMIN,AST,ALT,ALKPHOS,BILITOT,BILIDIR,IBILI,LIPASE in the last 72 hours PT/INR No results found for this basename: LABPROT:2,INR:2 in the last 72 hours ABG No results found for this basename: PHART:2,PCO2:2,PO2:2,HCO3:2 in the last 72 hours  Studies/Results: No results found.  Assessment: Principal Problem:  *GI bleed Active Problems:  Diabetes mellitus  Hypertension  CKD (chronic kidney disease), stage III  Thrombocytopenia  Chronic systolic heart failure  Diverticulosis of colon with hemorrhage s/p left colectomy and colostomy 07/11/11   Procedure(s): EXPLORATORY LAPAROTOMY  Plan: DC today Follow up in office for staple removal.  LOS: 8 days    Alyse Low 07/20/2011 11:26 AM

## 2011-07-20 NOTE — Progress Notes (Signed)
   CARE MANAGEMENT NOTE 07/20/2011  Patient:  [name], [name]   Account Number:  [id number]  Date Initiated:  07/20/2011  Documentation initiated by:  Lorenda Ishihara  Subjective/Objective Assessment:   78 yo female admitted with GIB, required resection of bowel with colostomy. PTA lived at home with dtr. PCP is Dr. Margaretmary Bayley, Cardiologist is Dr. Algie Coffer     Action/Plan:   d/c home with dtr and [facility] services   Anticipated DC Date:  07/20/2011   Anticipated DC Plan:  HOME W HOME HEALTH SERVICES      DC Planning Services  CM consult      [facility] Choice  Resumption Of Svcs/PTA Provider   Choice offered to / List presented to:  C-4 Adult Children        HH arranged  HH-1 RN  HH-10 DISEASE MANAGEMENT  HH-2 PT  HH-3 OT      [facility] agency  [facility]   Status of service:  Completed, signed off Medicare Important Message given?   (If response is "NO", the following Medicare IM given date fields will be blank) Date Medicare IM given:   Date Additional Medicare IM given:    Discharge Disposition:  HOME W HOME HEALTH SERVICES  Per UR Regulation:  Reviewed for med. necessity/level of care/duration of stay  Comments:  07-20-11 Lorenda Ishihara RN CM 1610 Patient active with [facility] from previous hospitalization, will resume [facility] services. Notified Darl Pikes with [facility] of d/c today.

## 2011-07-20 NOTE — Discharge Summary (Signed)
Physician Discharge Summary  Patient ID: [name] MRN: [id number] DOB/AGE: October 25, 1925 78 y.o.  Admit date: 07/12/2011 Discharge date: 07/20/2011  Primary Care Physician:  Laurena Slimmer, MD, MD   Discharge Diagnoses:    Principal Problem:  *GI bleed Active Problems:  Diabetes mellitus  Hypertension  CKD (chronic kidney disease), stage III  Thrombocytopenia  Chronic systolic heart failure  Diverticulosis of colon with hemorrhage s/p left colectomy and colostomy 07/11/11  Acute Blood Loss Anemia   Medication List  As of 07/20/2011 10:06 AM   STOP taking these medications         allopurinol 100 MG tablet      aspirin EC 81 MG tablet      Cinnamon 500 MG capsule      CORICIDIN D 2-30-325 MG Tabs      insulin NPH-insulin regular (50-50) 100 UNIT/ML injection      mineral oil liquid      phenazopyridine 200 MG tablet         TAKE these medications         albuterol-ipratropium 18-103 MCG/ACT inhaler   Commonly known as: COMBIVENT   Inhale 2 puffs into the lungs every 6 (six) hours as needed. For shortness of breath      ALPRAZolam 0.25 MG tablet   Commonly known as: XANAX   Take 0.25 mg by mouth at bedtime.      atorvastatin 20 MG tablet   Commonly known as: LIPITOR   Take 20 mg by mouth at bedtime.      B-complex with vitamin C tablet   Take 1 tablet by mouth daily.      Calcium-Magnesium-Zinc 1000-400-15 MG Tabs   Take 1 tablet by mouth daily.      digoxin 0.125 MG tablet   Commonly known as: [name]   Take 125 mcg by mouth every other day.      famotidine 20 MG tablet   Commonly known as: PEPCID   Take 20 mg by mouth at bedtime.      furosemide 40 MG tablet   Commonly known as: LASIX   Take 40 mg by mouth 2 (two) times daily.      GERITOL COMPLETE PO   Take 2 tablets by mouth daily.      ICAPS Caps   Take 1 capsule by mouth 2 (two) times daily.      isosorbide mononitrate 30 MG 24 hr tablet   Commonly known as: IMDUR   Take 15 mg by  mouth daily.      metoprolol tartrate 25 MG tablet   Commonly known as: LOPRESSOR   Take 12.5 mg by mouth 2 (two) times daily.      mometasone 50 MCG/ACT nasal spray   Commonly known as: NASONEX   Place 2 sprays into the nose at bedtime as needed. For congestion      omega-3 acid ethyl esters 1 G capsule   Commonly known as: LOVAZA   Take 2 g by mouth daily.      oxyCODONE-acetaminophen 5-325 MG per tablet   Commonly known as: PERCOCET   Take 1-2 tablets by mouth every 4 (four) hours as needed.      potassium chloride 10 MEQ tablet   Commonly known as: K-DUR   Take 10 mEq by mouth daily.      sodium chloride 0.65 % nasal spray   Commonly known as: OCEAN   Place 1 spray into the nose at bedtime as needed. For congestion  vitamin C 100 MG tablet   Take 100 mg by mouth daily.             Disposition and Follow-up:  Will be discharged home today in stable and improved condition. Home health therapies have been arranged by case management. Will need followup with surgery as deemed appropriate by them.  Consults:  general surgery    Significant Diagnostic Studies:  Dg Chest 1 View  07/13/2011  *RADIOLOGY REPORT*  Clinical Data: Central line placement.  CHEST - 1 VIEW  Comparison: Chest radiograph performed 06/29/2011  Findings: The patient's right IJ line is noted ending about the cavoatrial junction.  The lungs remain hypoexpanded.  Previously noted diffuse bilateral airspace opacification has improved, with mild residual vascular congestion and vascular crowding noted, but no significant pulmonary edema now seen.  No pleural effusion or pneumothorax is identified.  The cardiomediastinal silhouette remains mildly enlarged. Calcification is noted within the thoracic aorta.  No acute osseous abnormalities are seen.  IMPRESSION:  1.  Right IJ line noted ending about the cavoatrial junction. 2.  Lungs remain hypoexpanded; diffuse bilateral airspace opacification has  significantly improved, with mild residual vascular congestion and mild cardiomegaly seen.  No significant pulmonary edema now present.  Original Report Authenticated By: Tonia Ghent, M.D.   Nm Gi Blood Loss  07/12/2011  *RADIOLOGY REPORT*  Clinical Data: 78 year old female with GI bleed.  NUCLEAR MEDICINE GASTROINTESTINAL BLEEDING STUDY  Technique:  Sequential abdominal images were obtained following intravenous administration of Tc-62m labeled red blood cells.  Radiopharmaceutical: 23.8MILLI CURIE ULTRATAG TECHNETIUM TC 79M- LABELED RED BLOOD CELLS IV KIT  Comparison: None  Findings: Activity identified within the left colon is noted, compatible with GI bleed.  The source of this GI bleed likely originates in the descending colon. No other abnormalities are present.  IMPRESSION: Active GI bleeding from the left/descending colon.  Original Report Authenticated By: Rosendo Gros, M.D.    Brief H and P: For complete details please refer to admission H and P, but in brief patient presented with weeks of lower abdominal pain and pressure with associated vaginal pain and itching. Seen by PCP and treated for UTI and improved with pyridium. Vaginal symptoms improved then returned 3 days ago. Was told that her symtoms were due to vaginal dryness . +Dysuria, no hematuria. +suprapubic pain and flank pain. No nausea, vomiting, chest pain, she has had some SOB at rest releived by receiving iv lasix in the ER , she has a hx of copd . No cough or fever. Generalized weakness with decreased PO intake for the past two days and inability to ambulate . Subsequently developed a GI bleed. We were asked to admit her for further evaluation and management.     Hospital Course:  Principal Problem:  *GI bleed Active Problems:  Diabetes mellitus  Hypertension  CKD (chronic kidney disease), stage III  Thrombocytopenia  Chronic systolic heart failure  Diverticulosis of colon with hemorrhage s/p left colectomy and  colostomy 07/11/11  Acute Blood Loss Anemia   #1 GI Bleed: required left colectomy and colostomy to control the bleeding. Is now tolerating solid meals without nausea and vomiting. Has a large amount of brown-colored stool in her colostomy bag. Hb has been stable around the 8-8.5 range.  #2 Thrombocytopenia:  Platelets around 80 and stable. Discussed with Dr. Cyndie Chime who can followup as an outpatient if needed.  #3 CKD Stage III: Cr at baseline. Follow.   #4 Chronic Systolic CHF: ECHO with EF 20%.  No exacerbations this admission. Her cardiologist, Dr. Sharyn Lull has been on board this admission.  Has been deemed medically ready for discharge home today. Home health therapies have been arranged.       Time spent on Discharge: Greater than 30 minutes.  SignedChaya Jan [facility] Pager: [phone] 07/20/2011, 10:06 AM

## 2011-07-21 LAB — GLUCOSE, CAPILLARY: Glucose-Capillary: 252 mg/dL — ABNORMAL HIGH (ref 70–99)

## 2011-07-21 MED ORDER — OXYCODONE-ACETAMINOPHEN 5-325 MG PO TABS: 1.0000 | ORAL_TABLET | ORAL | Status: AC | PRN

## 2011-07-29 ENCOUNTER — Encounter (INDEPENDENT_AMBULATORY_CARE_PROVIDER_SITE_OTHER): Payer: Self-pay | Admitting: Surgery

## 2011-07-29 ENCOUNTER — Ambulatory Visit (INDEPENDENT_AMBULATORY_CARE_PROVIDER_SITE_OTHER): Payer: PRIVATE HEALTH INSURANCE | Admitting: Surgery

## 2011-07-29 VITALS — BP 138/70 | HR 90 | Temp 97.4°F | Resp 30

## 2011-07-29 DIAGNOSIS — K5731 Diverticulosis of large intestine without perforation or abscess with bleeding: Secondary | ICD-10-CM

## 2011-07-29 NOTE — Patient Instructions (Signed)
1.  Get in as much nutrition as possible.  2.  Change abdominal wound 2 X/day.  Saline damp to dry gauze.

## 2011-07-29 NOTE — Progress Notes (Signed)
[facility]  Ovidio Kin, MD,  FACS [address] [place]  [address] [place], [place]    [address] Phone:  [phone] FAX:  [phone]   Re:   [name] DOB:   July 24, 1925 MRN:   [id number]  ASSESSMENT AND PLAN: 1.  S/P left colectomy for GI bleed - 07/12/2011.  Wound has not healed and just fell apart.  This is primarily a nutrition problem.  Will arrange HHC for daily dressing changes.  She has two daughters with her who are taking good care of her.  They will change the dressing the other time.  I will see her back in one week.  2. Thrombocytopenia - 85,000 - 07/12/2011   ? Reason.  3. Diabetes Mellitus.   I suggested that they make an appointment with Dr. Chestine Spore. 4. CKD  5. LBBB.  6. Chronic systolic heart failure - has appointment with Dr. Marta Antu. Algie Coffer. 7. Just discharged 06/30/2011 with diagnosis of acute on chronic respiratory failure.  Though daughter disputes this and said that she was treated for a UTI.  8. Chronic systolic CHF - EF 37%  9. Nutrition - Alb - 2.8.  10. History of vena caval filter. 11.  Malnutrition. 12.  Severe deconditioning.  HISTORY OF PRESENT ILLNESS: Chief Complaint  Patient presents with  . Follow-up    [name] is a 78 y.o. (DOB: 28-Jul-1925)  AA female who is a patient of Laurena Slimmer, MD, MD and comes to me today for follow up of colectomy for lower GI bleed.  She is not very conversant.  She is wheelchair bound.  She cannot help her self at all.  It required two people to get her on the table.  We talked about reversing her colostomy - but this is a long term plan and not possible under current circumstances.  PHYSICAL EXAM: BP 138/70  Pulse 90  Temp(Src) 97.4 F (36.3 C) (Temporal)  Resp 30  Abdomen:  colostomy looks good.  Wound just fell apart with staple removal.  She has no evidence of wound healing, consistent with very poor nutrition.  DATA REVIEWED: Chart from hospital.  Ovidio Kin,  MD, FACS Office:  [phone]

## 2011-07-30 ENCOUNTER — Telehealth (INDEPENDENT_AMBULATORY_CARE_PROVIDER_SITE_OTHER): Payer: Self-pay

## 2011-07-30 NOTE — Telephone Encounter (Signed)
The nurse called to let Dr Ezzard Standing know that when she pulled the dressing off there was green drainage.  I notified Dr Ezzard Standing and there are no new orders.  We see the pt next week.

## 2011-07-31 ENCOUNTER — Telehealth (INDEPENDENT_AMBULATORY_CARE_PROVIDER_SITE_OTHER): Payer: Self-pay

## 2011-07-31 NOTE — Telephone Encounter (Signed)
Patients daughter called saying green drainage coming from incision. No other symptoms such as fever or chills. Want to know if antibiotic can be called in.

## 2011-07-31 NOTE — Telephone Encounter (Signed)
Called patients daughter back and gave her the info and she said ok.

## 2011-07-31 NOTE — Telephone Encounter (Signed)
Per Dr Ezzard Standing, she does not need antibiotics.  She needs to work on her nutrition.  She needs to eat anything she can keep down such as steak, icecream.

## 2011-08-06 ENCOUNTER — Encounter (INDEPENDENT_AMBULATORY_CARE_PROVIDER_SITE_OTHER): Payer: PRIVATE HEALTH INSURANCE | Admitting: Surgery

## 2011-08-07 ENCOUNTER — Ambulatory Visit (INDEPENDENT_AMBULATORY_CARE_PROVIDER_SITE_OTHER): Payer: PRIVATE HEALTH INSURANCE | Admitting: Surgery

## 2011-08-07 VITALS — BP 138/70 | HR 102 | Temp 97.1°F | Resp 48

## 2011-08-07 DIAGNOSIS — K5731 Diverticulosis of large intestine without perforation or abscess with bleeding: Secondary | ICD-10-CM

## 2011-08-07 NOTE — Progress Notes (Addendum)
[facility]  Ovidio Kin, MD,  FACS [address] [place]  [address] [place], [place]    [address] Phone:  [phone] FAX:  [phone]   Re:   [name] DOB:   10/15/1925 MRN:   [id number]  ASSESSMENT AND PLAN: 1.  S/P left colectomy for GI bleed - 07/12/2011.  Non healing midline wound, primarily due to poor nutrition.  Evidence of pseudomonas overgrowth, but cleaned up easily.  She does have some granulation tissue on the side walls of the wound.  Continue twice a day dressing changes.  Push hard on oral intake of food.  See me back in one week.   [I spoke to  Dr. Chestine Spore 08/10/2011.  I would not treat pseudomonas, but continue local wound care.  I talked to him about nutrition issues. DN 08/10/2011]  2. Thrombocytopenia - 85,000 - 07/12/2011   ? Reason.  3. Diabetes Mellitus.   I suggested that they make an appointment with Dr. Chestine Spore. 4. CKD  5. LBBB.  6. Chronic systolic heart failure - has appointment with Dr. Marta Antu. Algie Coffer. 7. Just discharged 06/30/2011 with diagnosis of acute on chronic respiratory failure.  Though daughter disputes this and said that she was treated for a UTI.  8. Chronic systolic CHF - EF 37%  9. Nutrition - Alb - 2.8.  10. History of vena caval filter. 11.  Malnutrition. 12.  Severe deconditioning.  HISTORY OF PRESENT ILLNESS: Chief Complaint  Patient presents with  . Wound Check    [name] is a 78 y.o. (DOB: 10-05-1925)  AA female who is a patient of Laurena Slimmer, MD, MD and comes to me today for follow up of colectomy for lower GI bleed and wound check. She is eating some, but probably not enough.  She does not look any stronger, but she looks no weaker either. She has two daughters with her.  PHYSICAL EXAM: BP 138/70  Pulse 102  Temp(Src) 97.1 F (36.2 C) (Temporal)  Resp 48  Abdomen:  colostomy looks good.  Wound has pseudomonas overgrowth.  But there is some granulation tissue along the side walls.  I  debrided some necrotic tissue at the bottom of the wound.  DATA REVIEWED: Chart from hospital.  Ovidio Kin, MD, FACS Office:  [phone]

## 2011-08-13 ENCOUNTER — Encounter (INDEPENDENT_AMBULATORY_CARE_PROVIDER_SITE_OTHER): Payer: Self-pay | Admitting: Surgery

## 2011-08-13 ENCOUNTER — Ambulatory Visit (INDEPENDENT_AMBULATORY_CARE_PROVIDER_SITE_OTHER): Payer: PRIVATE HEALTH INSURANCE | Admitting: Surgery

## 2011-08-13 VITALS — BP 138/68 | HR 82 | Resp 14 | Ht 63.0 in

## 2011-08-13 DIAGNOSIS — K5731 Diverticulosis of large intestine without perforation or abscess with bleeding: Secondary | ICD-10-CM

## 2011-08-13 NOTE — Progress Notes (Signed)
[facility]  Ovidio Kin, MD,  FACS [address] [place]  [address] [place], [place]    [address] Phone:  [phone] FAX:  [phone]   Re:   [name] DOB:   14-Jan-1926 MRN:   [id number]  ASSESSMENT AND PLAN: 1.  S/P left colectomy for GI bleed - 07/12/2011.  Non healing midline wound, primarily due to poor nutrition.  Continue twice a day dressing changes.  Push hard on oral intake of food.  See me back in one week. (One of my partners will see her next week)  I spoke to  Dr. Chestine Spore 08/10/2011.  I talked to him about nutrition issues.  2. Thrombocytopenia - 85,000 - 07/12/2011   ? Reason.  3. Diabetes Mellitus.   I suggested that they make an appointment with Dr. Chestine Spore. 4. CKD  5. LBBB.  6. Chronic systolic heart failure - has appointment with Dr. Marta Antu. Algie Coffer. 7. Discharged 06/30/2011 with diagnosis of acute on chronic respiratory failure.    8. Chronic systolic CHF - EF 37%  9. Nutrition - Alb - 2.8.  10. History of vena caval filter. 11.  Malnutrition. 12.  History of stroke affecting right side - remote. 13.  Severe deconditioning.  She has made no progress since going home.  In fact, if anything, she is more deconditioned.  I went over basic physical therapy goals with daughters.  She is totally dependent on her daughters to move her.  HISTORY OF PRESENT ILLNESS: Chief Complaint  Patient presents with  . Wound Check   [name] is a 78 y.o. (DOB: November 28, 1925)  AA female who is a patient of Laurena Slimmer, MD, MD and comes to me today for follow up of her wound after a colectomy for lower GI bleed and wound check.  She is a month out and has not improved much.   She has two daughters with her.  PHYSICAL EXAM: BP 138/68  Pulse 82  Resp 14  Ht 5\' 3"  (1.6 m)  Abdomen:  Colostomy looks good.  I debrided the wound again.  She has very little evidence of wound healing.  I think this is a nutrition problem. Left foot:  1.5 x 2.5 cm  lateral ulcer.  DATA REVIEWED: Chart from hospital.  Ovidio Kin, MD, FACS Office:  [phone]

## 2011-08-21 ENCOUNTER — Ambulatory Visit (INDEPENDENT_AMBULATORY_CARE_PROVIDER_SITE_OTHER): Payer: PRIVATE HEALTH INSURANCE | Admitting: Surgery

## 2011-08-21 ENCOUNTER — Telehealth (INDEPENDENT_AMBULATORY_CARE_PROVIDER_SITE_OTHER): Payer: Self-pay | Admitting: General Surgery

## 2011-08-21 VITALS — BP 110/60 | HR 84 | Temp 97.8°F | Resp 25

## 2011-08-21 DIAGNOSIS — K5731 Diverticulosis of large intestine without perforation or abscess with bleeding: Secondary | ICD-10-CM

## 2011-08-21 NOTE — Progress Notes (Signed)
I examined [name] lower midline incision. The deep opening down to the fascia this probably 5 cm in depth. The walls are clean but there is some exudate down in its depths. I think that this wound would respond to negative pressure dressing such as a wound VAC [facility] cannot set this up for [place].  The daughters were concerned about her having some trouble with choking him a BV she has had some small strokes and needs some ThickIT added to her water to help with swallowing.  She can follow up with Dr. Ezzard Standing and 3-4 weeks

## 2011-08-21 NOTE — Telephone Encounter (Signed)
I made an appt for 4 weeks but the daughter Steward Drone wants to keep the appt for next week because Dr Ezzard Standing had wanted to see her weekly until this heals.

## 2011-08-21 NOTE — Telephone Encounter (Signed)
Called [facility] to advise Dr. Daphine Deutscher has requested a wound vac for this patient. Rep Maralyn Sago) confirmed to fax information to [phone].

## 2011-08-21 NOTE — Progress Notes (Signed)
Addended by: Lenise Herald on: 08/21/2011 03:55 PM   Modules accepted: Orders

## 2011-08-21 NOTE — Telephone Encounter (Signed)
Can you please schedule this patient to see Dr. Ezzard Standing for follow up (post op) per Dr. Ermalene Searing request in 4 weeks? I checked the schedule the slots I saw were on hold. Dr. Daphine Deutscher has requested [facility] provide a wound vac for this patient also. She is currently under their care.

## 2011-08-24 ENCOUNTER — Telehealth (INDEPENDENT_AMBULATORY_CARE_PROVIDER_SITE_OTHER): Payer: Self-pay | Admitting: General Surgery

## 2011-08-24 ENCOUNTER — Telehealth (INDEPENDENT_AMBULATORY_CARE_PROVIDER_SITE_OTHER): Payer: Self-pay | Admitting: Surgery

## 2011-08-24 NOTE — Telephone Encounter (Signed)
Calling to inquire when [facility] would be out to apply wound vac.  Pt was seen by Dr[facility] Daphine Deutscher [facility] on [facility]; ordered then.  Suggested they call [facility] to inquire.

## 2011-08-24 NOTE — Telephone Encounter (Signed)
SARA WITH [facility] CALLED RE ORDER FOR NEGATIVE PRESSURE DEVICE TO BE APPLIED/ PT WAS SEEN BY DR/ Daphine Deutscher 08-21-11/ ORDER WAS SIGNED BY DR. MARTIN ON Friday/ I REFAXED THE ORDER ALONG WITH OFFICE NOTE TO SARA/ [phone]/GY

## 2011-08-27 ENCOUNTER — Encounter (INDEPENDENT_AMBULATORY_CARE_PROVIDER_SITE_OTHER): Payer: PRIVATE HEALTH INSURANCE | Admitting: Surgery

## 2011-08-28 ENCOUNTER — Ambulatory Visit (INDEPENDENT_AMBULATORY_CARE_PROVIDER_SITE_OTHER): Payer: PRIVATE HEALTH INSURANCE | Admitting: Surgery

## 2011-08-28 ENCOUNTER — Encounter (INDEPENDENT_AMBULATORY_CARE_PROVIDER_SITE_OTHER): Payer: Self-pay | Admitting: Surgery

## 2011-08-28 VITALS — BP 132/66 | HR 70 | Temp 97.4°F | Ht 63.0 in

## 2011-08-28 DIAGNOSIS — K5731 Diverticulosis of large intestine without perforation or abscess with bleeding: Secondary | ICD-10-CM

## 2011-08-28 NOTE — Progress Notes (Signed)
[facility]  Ovidio Kin, MD,  FACS [address] [place]  [address] [place], [place]    [address] Phone:  [phone] FAX:  [phone]   Re:   [name] DOB:   Aug 30, 1925 MRN:   [id number]  ASSESSMENT AND PLAN: 1.  S/P left colectomy for GI bleed - 07/12/2011.  Overall, she looks better, her general nutrition looks better, and the wound now has granulation tissue on the side walls.  Dr. Daphine Deutscher has ordered a VAC wound.  Will try this.  I will see her back in 3 to 4 weeks.  2. Thrombocytopenia - 85,000 - 07/12/2011  3. Diabetes Mellitus.   Followed by Dr. Audrie Lia 4. CKD  5. LBBB.  6. Chronic systolic heart failure - followed by Dr. Marta Antu. Algie Coffer. 7. Chronic systolic CHF - EF 37%  8. Nutrition - Alb - 2.8.  9. History of vena caval filter. 10.  Malnutrition. 11.  History of stroke affecting right side - remote. 12.  Deconditioning.  Looks better, though dependent on movement from others.  HISTORY OF PRESENT ILLNESS: Chief Complaint  Patient presents with  . Follow-up    reck abd wound   [name] is a 78 y.o. (DOB: [birth year])  AA female who is a patient of Laurena Slimmer, MD, MD and comes to me today for follow up of her wound after a colectomy for lower GI bleed and wound check.  She looks better, her wound looks better.  She is making baby steps in the right direction.   She has two daughters with her.  PHYSICAL EXAM: BP 132/66  Pulse 70  Temp(Src) 97.4 F (36.3 C) (Temporal)  Ht 5\' 3"  (1.6 m)  SpO2 93%  Abdomen:  Colostomy looks good.  I debrided the wound again.  But the wound looks better with granulation tissue on the sidewalls. Left foot:   lateral ulcer.  DATA REVIEWED: Labs from Dr. Shelton Silvas, MD, FACS Office:  [phone]

## 2011-09-03 ENCOUNTER — Telehealth (INDEPENDENT_AMBULATORY_CARE_PROVIDER_SITE_OTHER): Payer: Self-pay

## 2011-09-03 NOTE — Telephone Encounter (Signed)
Copy of orders for wound vac placement ordered on 08/21/2011 faxed to [facility] [phone]. (Faxed on 08/31/11/Merlyn Bollen)

## 2011-09-13 ENCOUNTER — Inpatient Hospital Stay (HOSPITAL_COMMUNITY): Payer: PRIVATE HEALTH INSURANCE

## 2011-09-13 ENCOUNTER — Encounter (HOSPITAL_COMMUNITY): Payer: Self-pay | Admitting: *Deleted

## 2011-09-13 ENCOUNTER — Inpatient Hospital Stay (HOSPITAL_COMMUNITY)
Admission: EM | Admit: 2011-09-13 | Discharge: 2011-09-15 | DRG: 204 | Disposition: A | Discharge status: Home Health | Payer: PRIVATE HEALTH INSURANCE | Attending: Internal Medicine | Admitting: Internal Medicine

## 2011-09-13 ENCOUNTER — Emergency Department (HOSPITAL_COMMUNITY): Payer: PRIVATE HEALTH INSURANCE

## 2011-09-13 DIAGNOSIS — R531 Weakness: Secondary | ICD-10-CM

## 2011-09-13 DIAGNOSIS — K922 Gastrointestinal hemorrhage, unspecified: Secondary | ICD-10-CM

## 2011-09-13 DIAGNOSIS — I5022 Chronic systolic (congestive) heart failure: Secondary | ICD-10-CM | POA: Diagnosis present

## 2011-09-13 DIAGNOSIS — Z933 Colostomy status: Secondary | ICD-10-CM

## 2011-09-13 DIAGNOSIS — E871 Hypo-osmolality and hyponatremia: Secondary | ICD-10-CM | POA: Diagnosis present

## 2011-09-13 DIAGNOSIS — I447 Left bundle-branch block, unspecified: Secondary | ICD-10-CM | POA: Diagnosis present

## 2011-09-13 DIAGNOSIS — I5041 Acute combined systolic (congestive) and diastolic (congestive) heart failure: Secondary | ICD-10-CM

## 2011-09-13 DIAGNOSIS — R0989 Other specified symptoms and signs involving the circulatory and respiratory systems: Principal | ICD-10-CM | POA: Diagnosis present

## 2011-09-13 DIAGNOSIS — E875 Hyperkalemia: Secondary | ICD-10-CM | POA: Diagnosis present

## 2011-09-13 DIAGNOSIS — I129 Hypertensive chronic kidney disease with stage 1 through stage 4 chronic kidney disease, or unspecified chronic kidney disease: Secondary | ICD-10-CM | POA: Diagnosis present

## 2011-09-13 DIAGNOSIS — Z9981 Dependence on supplemental oxygen: Secondary | ICD-10-CM

## 2011-09-13 DIAGNOSIS — I10 Essential (primary) hypertension: Secondary | ICD-10-CM | POA: Diagnosis present

## 2011-09-13 DIAGNOSIS — R0609 Other forms of dyspnea: Principal | ICD-10-CM | POA: Diagnosis present

## 2011-09-13 DIAGNOSIS — D696 Thrombocytopenia, unspecified: Secondary | ICD-10-CM

## 2011-09-13 DIAGNOSIS — D649 Anemia, unspecified: Secondary | ICD-10-CM | POA: Diagnosis present

## 2011-09-13 DIAGNOSIS — J811 Chronic pulmonary edema: Secondary | ICD-10-CM

## 2011-09-13 DIAGNOSIS — N390 Urinary tract infection, site not specified: Secondary | ICD-10-CM | POA: Diagnosis present

## 2011-09-13 DIAGNOSIS — N183 Chronic kidney disease, stage 3 unspecified: Secondary | ICD-10-CM | POA: Diagnosis present

## 2011-09-13 DIAGNOSIS — M549 Dorsalgia, unspecified: Secondary | ICD-10-CM | POA: Diagnosis present

## 2011-09-13 DIAGNOSIS — J4489 Other specified chronic obstructive pulmonary disease: Secondary | ICD-10-CM | POA: Diagnosis present

## 2011-09-13 DIAGNOSIS — R0600 Dyspnea, unspecified: Secondary | ICD-10-CM | POA: Diagnosis present

## 2011-09-13 DIAGNOSIS — N179 Acute kidney failure, unspecified: Secondary | ICD-10-CM

## 2011-09-13 DIAGNOSIS — K5731 Diverticulosis of large intestine without perforation or abscess with bleeding: Secondary | ICD-10-CM

## 2011-09-13 DIAGNOSIS — Z66 Do not resuscitate: Secondary | ICD-10-CM | POA: Diagnosis present

## 2011-09-13 DIAGNOSIS — J449 Chronic obstructive pulmonary disease, unspecified: Secondary | ICD-10-CM | POA: Diagnosis present

## 2011-09-13 DIAGNOSIS — Z9049 Acquired absence of other specified parts of digestive tract: Secondary | ICD-10-CM

## 2011-09-13 DIAGNOSIS — I509 Heart failure, unspecified: Secondary | ICD-10-CM | POA: Diagnosis present

## 2011-09-13 DIAGNOSIS — E119 Type 2 diabetes mellitus without complications: Secondary | ICD-10-CM | POA: Diagnosis present

## 2011-09-13 DIAGNOSIS — B3749 Other urogenital candidiasis: Secondary | ICD-10-CM

## 2011-09-13 HISTORY — DX: Diverticulitis of intestine, part unspecified, without perforation or abscess without bleeding: K57.92

## 2011-09-13 LAB — CBC
HCT: 32.6 % — ABNORMAL LOW (ref 36.0–46.0)
Hemoglobin: 10 g/dL — ABNORMAL LOW (ref 12.0–15.0)
MCH: 25.2 pg — ABNORMAL LOW (ref 26.0–34.0)
MCHC: 30.7 g/dL (ref 30.0–36.0)
MCV: 82.1 fL (ref 78.0–100.0)
Platelets: 259 10*3/uL (ref 150–400)
RBC: 3.97 MIL/uL (ref 3.87–5.11)
RDW: 18.9 % — ABNORMAL HIGH (ref 11.5–15.5)
WBC: 7.3 10*3/uL (ref 4.0–10.5)

## 2011-09-13 LAB — BASIC METABOLIC PANEL
BUN: 44 mg/dL — ABNORMAL HIGH (ref 6–23)
CO2: 21 mEq/L (ref 19–32)
Calcium: 8.6 mg/dL (ref 8.4–10.5)
Calcium: 8.3 mg/dL — ABNORMAL LOW (ref 8.4–10.5)
Chloride: 91 mEq/L — ABNORMAL LOW (ref 96–112)
Creatinine, Ser: 1.13 mg/dL — ABNORMAL HIGH (ref 0.50–1.10)
GFR calc Af Amer: 51 mL/min — ABNORMAL LOW (ref >90)
GFR calc non Af Amer: 44 mL/min — ABNORMAL LOW (ref >90)
GFR calc non Af Amer: 46 mL/min — ABNORMAL LOW (ref >90)
Glucose, Bld: 272 mg/dL — ABNORMAL HIGH (ref 70–99)
Glucose, Bld: 239 mg/dL — ABNORMAL HIGH (ref 70–99)
Potassium: 5.3 mEq/L — ABNORMAL HIGH (ref 3.5–5.1)
Potassium: 5.4 mEq/L — ABNORMAL HIGH (ref 3.5–5.1)
Sodium: 124 mEq/L — ABNORMAL LOW (ref 135–145)
Sodium: 132 mEq/L — ABNORMAL LOW (ref 135–145)

## 2011-09-13 LAB — URINALYSIS, ROUTINE W REFLEX MICROSCOPIC
Bilirubin Urine: NEGATIVE
Glucose, UA: NEGATIVE mg/dL
Ketones, ur: NEGATIVE mg/dL
Nitrite: NEGATIVE
Protein, ur: NEGATIVE mg/dL
Specific Gravity, Urine: 1.018 (ref 1.005–1.030)
Urobilinogen, UA: 0.2 mg/dL (ref 0.0–1.0)
pH: 6 (ref 5.0–8.0)

## 2011-09-13 LAB — CARDIAC PANEL(CRET KIN+CKTOT+MB+TROPI)
CK, MB: 3 ng/mL (ref 0.3–4.0)
Relative Index: INVALID (ref 0.0–2.5)
Total CK: 39 U/L (ref 7–177)
Troponin I: <0.3 ng/mL (ref <0.30)
Troponin I: <0.3 ng/mL (ref <0.30)

## 2011-09-13 LAB — SODIUM, URINE, RANDOM: Sodium, Ur: 23 mEq/L

## 2011-09-13 LAB — URINE MICROSCOPIC-ADD ON

## 2011-09-13 LAB — OSMOLALITY, URINE: Osmolality, Ur: 296 mOsm/kg — ABNORMAL LOW (ref 390–1090)

## 2011-09-13 LAB — GLUCOSE, CAPILLARY: Glucose-Capillary: 236 mg/dL — ABNORMAL HIGH (ref 70–99)

## 2011-09-13 LAB — DIGOXIN LEVEL: Digoxin Level: 0.7 ng/mL — ABNORMAL LOW (ref 0.8–2.0)

## 2011-09-13 LAB — TROPONIN I: Troponin I: <0.3 ng/mL (ref <0.30)

## 2011-09-13 MED ORDER — TECHNETIUM TO 99M ALBUMIN AGGREGATED
3.0000 | Freq: Once | INTRAVENOUS | Status: AC | PRN
  Administered 2011-09-13: 3 via INTRAVENOUS

## 2011-09-13 MED ORDER — OXYCODONE HCL 5 MG PO TABS
5.0000 mg | ORAL_TABLET | ORAL | Status: DC | PRN
  Administered 2011-09-15: 5 mg via ORAL
  Filled 2011-09-13: qty 1

## 2011-09-13 MED ORDER — ALPRAZOLAM 0.25 MG PO TABS
0.2500 mg | ORAL_TABLET | Freq: Every day | ORAL | Status: DC
  Administered 2011-09-14: 0.25 mg via ORAL
  Filled 2011-09-13: qty 1

## 2011-09-13 MED ORDER — ONDANSETRON HCL 4 MG/2ML IJ SOLN
4.0000 mg | Freq: Three times a day (TID) | INTRAMUSCULAR | Status: DC | PRN
  Administered 2011-09-13: 4 mg via INTRAVENOUS
  Filled 2011-09-13: qty 2

## 2011-09-13 MED ORDER — SIMVASTATIN 40 MG PO TABS
40.0000 mg | ORAL_TABLET | Freq: Every day | ORAL | Status: DC
  Administered 2011-09-13 – 2011-09-15 (×3): 40 mg via ORAL
  Filled 2011-09-13 (×3): qty 1

## 2011-09-13 MED ORDER — ALBUTEROL SULFATE (5 MG/ML) 0.5% IN NEBU
2.5000 mg | INHALATION_SOLUTION | Freq: Four times a day (QID) | RESPIRATORY_TRACT | Status: DC
  Administered 2011-09-13 – 2011-09-15 (×5): 2.5 mg via RESPIRATORY_TRACT
  Filled 2011-09-13 (×5): qty 0.5

## 2011-09-13 MED ORDER — ACETAMINOPHEN 325 MG PO TABS
650.0000 mg | ORAL_TABLET | Freq: Four times a day (QID) | ORAL | Status: DC | PRN
  Administered 2011-09-14 (×2): 650 mg via ORAL
  Filled 2011-09-13 (×3): qty 2

## 2011-09-13 MED ORDER — DEXTROSE 5 % IV SOLN
1.0000 g | INTRAVENOUS | Status: DC
  Administered 2011-09-13 – 2011-09-14 (×2): 1 g via INTRAVENOUS
  Filled 2011-09-13 (×3): qty 10

## 2011-09-13 MED ORDER — ACETAMINOPHEN 650 MG RE SUPP: 650.0000 mg | Freq: Four times a day (QID) | RECTAL | Status: DC | PRN

## 2011-09-13 MED ORDER — SODIUM POLYSTYRENE SULFONATE 15 GM/60ML PO SUSP
15.0000 g | Freq: Once | ORAL | Status: AC
  Administered 2011-09-13: 15 g via ORAL
  Filled 2011-09-13: qty 60

## 2011-09-13 MED ORDER — INSULIN GLARGINE 100 UNIT/ML ~~LOC~~ SOLN
10.0000 [IU] | Freq: Every day | SUBCUTANEOUS | Status: DC
  Administered 2011-09-13 – 2011-09-14 (×2): 10 [IU] via SUBCUTANEOUS

## 2011-09-13 MED ORDER — SODIUM CHLORIDE 0.9 % IV SOLN
INTRAVENOUS | Status: DC
  Administered 2011-09-13: 20 mL/h via INTRAVENOUS
  Administered 2011-09-13: 10 mL/h via INTRAVENOUS

## 2011-09-13 MED ORDER — SODIUM POLYSTYRENE SULFONATE 15 GM/60ML PO SUSP
30.0000 g | Freq: Once | ORAL | Status: AC
  Administered 2011-09-13: 30 g via ORAL
  Filled 2011-09-13: qty 120

## 2011-09-13 MED ORDER — DIGOXIN 125 MCG PO TABS
125.0000 ug | ORAL_TABLET | ORAL | Status: DC
  Administered 2011-09-14: 125 ug via ORAL
  Filled 2011-09-13: qty 1

## 2011-09-13 MED ORDER — SODIUM CHLORIDE 0.9 % IV SOLN
INTRAVENOUS | Status: AC
  Administered 2011-09-13: 20:00:00 via INTRAVENOUS

## 2011-09-13 MED ORDER — ONDANSETRON HCL 4 MG/2ML IJ SOLN: 4.0000 mg | Freq: Four times a day (QID) | INTRAMUSCULAR | Status: DC | PRN

## 2011-09-13 MED ORDER — ALBUTEROL SULFATE (5 MG/ML) 0.5% IN NEBU
2.5000 mg | INHALATION_SOLUTION | RESPIRATORY_TRACT | Status: DC | PRN
  Filled 2011-09-13: qty 0.5

## 2011-09-13 MED ORDER — IPRATROPIUM-ALBUTEROL 18-103 MCG/ACT IN AERO
2.0000 | INHALATION_SPRAY | Freq: Four times a day (QID) | RESPIRATORY_TRACT | Status: DC | PRN
  Filled 2011-09-13: qty 14.7

## 2011-09-13 MED ORDER — ONDANSETRON HCL 4 MG PO TABS: 4.0000 mg | ORAL_TABLET | Freq: Four times a day (QID) | ORAL | Status: DC | PRN

## 2011-09-13 MED ORDER — METOPROLOL TARTRATE 12.5 MG HALF TABLET
12.5000 mg | ORAL_TABLET | Freq: Two times a day (BID) | ORAL | Status: DC
  Administered 2011-09-13 – 2011-09-15 (×5): 12.5 mg via ORAL
  Filled 2011-09-13 (×6): qty 1

## 2011-09-13 MED ORDER — IPRATROPIUM BROMIDE 0.02 % IN SOLN
0.5000 mg | Freq: Four times a day (QID) | RESPIRATORY_TRACT | Status: DC
  Administered 2011-09-13 – 2011-09-15 (×5): 0.5 mg via RESPIRATORY_TRACT
  Filled 2011-09-13 (×5): qty 2.5

## 2011-09-13 MED ORDER — INSULIN ASPART 100 UNIT/ML ~~LOC~~ SOLN
0.0000 [IU] | Freq: Three times a day (TID) | SUBCUTANEOUS | Status: DC
  Administered 2011-09-13: 5 [IU] via SUBCUTANEOUS
  Administered 2011-09-14: 2 [IU] via SUBCUTANEOUS
  Administered 2011-09-14: 5 [IU] via SUBCUTANEOUS
  Administered 2011-09-14: 3 [IU] via SUBCUTANEOUS
  Administered 2011-09-15: 8 [IU] via SUBCUTANEOUS

## 2011-09-13 MED ORDER — HYDROMORPHONE HCL PF 1 MG/ML IJ SOLN: 1.0000 mg | Freq: Once | INTRAMUSCULAR | Status: DC

## 2011-09-13 MED ORDER — MORPHINE SULFATE 4 MG/ML IJ SOLN
4.0000 mg | Freq: Once | INTRAMUSCULAR | Status: AC
  Administered 2011-09-13: 4 mg via INTRAVENOUS
  Filled 2011-09-13: qty 1

## 2011-09-13 MED ORDER — MORPHINE SULFATE 2 MG/ML IJ SOLN: 2.0000 mg | INTRAMUSCULAR | Status: DC | PRN

## 2011-09-13 NOTE — ED Notes (Signed)
Patient transported to [facility].

## 2011-09-13 NOTE — ED Notes (Signed)
Pt from home with reports of worsening shortness of breath and abdominal pain that started last night. Pt denies N/V/D and fever.

## 2011-09-13 NOTE — ED Notes (Signed)
Pt w/ hx of CHF presenting w/ SOB since last night.  Pt is on 2L 02 at home.  Clear bilateral breath sounds.  Pt breathing unlabored at 18/min.  100% on 2L [place].

## 2011-09-13 NOTE — Consult Note (Addendum)
WOC ostomy consult  Stoma type/location: LLQ Colostomy Stomal assessment/size: Not visualized Peristomal assessment: Per daughters, peristoma skin is intact.  They care for their mother at home and for her stoma/ostomy.  Emptying pouch here in hospital. Treatment options for stomal/peristomal skin: None indicated Output: thin brown stool  Ostomy pouching: 1pc. Karaya ring pouch #3224 (2-inch) with clamp.  I have ordered 4 pouches to bedside.  I will see tomorrow for initial V.A.C. Change. Changes thereafter can be applied by staff RN. Thanks, Ladona Mow, MSN, RN, [facility], CWOCN [phone])

## 2011-09-13 NOTE — H&P (Signed)
[name] is an 78 y.o. female.    PCP: Laurena Slimmer, MD, MD   Her cardiologist is Dr. Algie Coffer. Her surgeon is Dr. Ezzard Standing  Chief Complaint: Shortness of breath, and then back pain  HPI: This is 78 year old, African American female, with a past medical history of, diabetes, hypertension, systolic heart failure, COPD, who recently underwent sigmoid colectomy Hartman's pouch, and colostomy placement. This was done in February for diverticular bleeding. Patient has a wound VAC. According to the daughters patient has not done much since this recent hospitalization. She's been pretty much bed bound. Only in the last couple days her daughters have been trying to exercise her. And, then last night patient started complaining of some shortness of breath. She has oxygen at home, which was applied. Patient felt better and went to sleep. This morning she woke up and again felt a little short of breath and asked to be brought into the hospital. She denies any chest pain. There has been no cough, but the daughter does mention that patient sometimes coughs when eating or drinking fluids. There has been some swelling in the left foot area and a wound is present in the lateral aide of the foot. No drainage has been noted.  While she was in the ED waiting for evaluation and admission patient started developing pain in the back. The daughter tells me that she had similar back pain a few months ago, and had x-rays done, which showed arthritis in the back and th pain eventually resolved after she went home. Patient points to her mid to lower back. It is currently, severe pain in the form of cramps. Does not have any weakness in the lower extremities at this time. She drinks a lot of water at home, but not significantly large amount.   Home Medications: Prior to Admission medications   Medication Sig Start Date End Date Taking? Authorizing Provider  albuterol-ipratropium (COMBIVENT) 18-103 MCG/ACT inhaler  Inhale 2 puffs into the lungs every 6 (six) hours as needed. For shortness of breath   Yes Historical Provider, MD  ALPRAZolam (XANAX) 0.25 MG tablet Take 0.25 mg by mouth at bedtime.   Yes Historical Provider, MD  Ascorbic Acid (VITAMIN C) 100 MG tablet Take 100 mg by mouth daily.   Yes Historical Provider, MD  atorvastatin (LIPITOR) 20 MG tablet Take 20 mg by mouth at bedtime.   Yes Historical Provider, MD  ciprofloxacin (CIPRO) 500 MG tablet Take 500 mg by mouth 2 (two) times daily. Take for 10 days. 09/02/11  Yes Historical Provider, MD  digoxin ([name]) 0.125 MG tablet Take 125 mcg by mouth every other day.   Yes Historical Provider, MD  famotidine (PEPCID) 20 MG tablet Take 20 mg by mouth at bedtime.   Yes Historical Provider, MD  furosemide (LASIX) 40 MG tablet Take 40 mg by mouth 2 (two) times daily.    Yes Historical Provider, MD  insulin detemir (LEVEMIR) 100 UNIT/ML injection Inject 0-15 Units into the skin See admin instructions. Patient on sliding scale in the morning and takes 15 units at bedtime   Yes Historical Provider, MD  Iron-Vitamins (GERITOL COMPLETE PO) Take 2 tablets by mouth daily.   Yes Historical Provider, MD  metoprolol tartrate (LOPRESSOR) 25 MG tablet Take 12.5 mg by mouth 2 (two) times daily.   Yes Historical Provider, MD  mometasone (NASONEX) 50 MCG/ACT nasal spray Place 2 sprays into the nose at bedtime as needed. For congestion   Yes Historical Provider, MD  Multiple Vitamins-Minerals (ICAPS) CAPS Take 1 capsule by mouth 2 (two) times daily.   Yes Historical Provider, MD  omega-3 acid ethyl esters (LOVAZA) 1 G capsule Take 2 g by mouth daily.   Yes Historical Provider, MD  potassium chloride (K-DUR) 10 MEQ tablet Take 10 mEq by mouth daily.   Yes Historical Provider, MD  sodium chloride (OCEAN) 0.65 % nasal spray Place 1 spray into the nose at bedtime as needed. For congestion   Yes Historical Provider, MD  isosorbide mononitrate (IMDUR) 30 MG 24 hr tablet Take 15 mg  by mouth daily.    Historical Provider, MD    Allergies:  Allergies  Allergen Reactions  . Avelox (Moxifloxacin Hcl In Nacl)   . Coumadin Other (See Comments)    Causes severe bleeding  . Proton Pump Inhibitors Other (See Comments)    Thrombocytopenia    Past Medical History: Past Medical History  Diagnosis Date  . Diabetes mellitus   . Hypertension   . Vascular disease   . Shortness of breath   . Bladder infection   . COPD (chronic obstructive pulmonary disease)   . Myocardial infarction   . Stroke   . GERD (gastroesophageal reflux disease)   . Diverticulitis     Past Surgical History  Procedure Date  . Bladder tack   . Femoral artery stent   . Abdominal hysterectomy   . Esophagogastroduodenoscopy 07/12/2011    Procedure: ESOPHAGOGASTRODUODENOSCOPY (EGD);  Surgeon: Theda Belfast, MD;  Location: [facility];  Service: Endoscopy;  Laterality: N/A;  bedside   . Flexible sigmoidoscopy 07/12/2011    Procedure: FLEXIBLE SIGMOIDOSCOPY;  Surgeon: Theda Belfast, MD;  Location: [facility];  Service: Endoscopy;  Laterality: N/A;  . Laparotomy 07/12/2011    Procedure: EXPLORATORY LAPAROTOMY;  Surgeon: Kandis Cocking, MD;  Location: [facility];  Service: General;  Laterality: N/A;  exploratory laparotomy, left sigmoid colectomy, creation of colostomy  . Abdominal surgery     Social History:  reports that she has never smoked. She has never used smokeless tobacco. She reports that she does not drink alcohol or use illicit drugs.  Family History: There is mention of hypertension in the family  Review of Systems  unobtainable from patient due to severe pain  Physical Examination Blood pressure 154/70, pulse 91, temperature 97.8 F (36.6 C), temperature source Oral, resp. rate 21, SpO2 100.00%.  General appearance: alert, no distress and moderate discomfort Head: Normocephalic, without obvious abnormality, atraumatic Eyes: conjunctivae/corneas clear. PERRL, EOM's intact. Fundi  benign. Neck: no adenopathy, no carotid bruit, no JVD, supple, symmetrical, trachea midline and thyroid not enlarged, symmetric, no tenderness/mass/nodules Back: There is tenderness to palpation over the lower thoracic and upper lumbar vertebrae. Resp: clear to auscultation bilaterally Cardio: S1, S2 is normal. Regular. Systolic murmur appreciated over the precordium. No rubs. GI: There is a colostomy bag as well as a wound VAC. No specific tenderness was present in the abdomen. Bowel sounds are present. No masses, organomegaly appreciated. Extremities: There is mild pitting edema in the lower extremities. Pulses: 2+ and symmetric Skin: Skin color, texture, turgor normal. No rashes or lesions Lymph nodes: Cervical, supraclavicular, and axillary nodes normal. Neurologic: She is alert, but because of pain in the back. She is thrashing around in the bed. Cannot get comfortable. She is moving all extremities and no focal deficits appreciated.  Laboratory Data: Results for orders placed during the hospital encounter of 09/13/11 (from the past 48 hour(s))  CBC     Status:  Abnormal   Collection Time   09/13/11  8:37 AM      Component Value Range Comment   WBC 7.3  4.0 - 10.5 (K/uL)    RBC 3.97  3.87 - 5.11 (MIL/uL)    Hemoglobin 10.0 (*) 12.0 - 15.0 (g/dL)    HCT 16.1 (*) 09.6 - 46.0 (%)    MCV 82.1  78.0 - 100.0 (fL)    MCH 25.2 (*) 26.0 - 34.0 (pg)    MCHC 30.7  30.0 - 36.0 (g/dL)    RDW 04.5 (*) 40.9 - 15.5 (%)    Platelets 259  150 - 400 (K/uL)   BASIC METABOLIC PANEL     Status: Abnormal   Collection Time   09/13/11  8:37 AM      Component Value Range Comment   Sodium 124 (*) 135 - 145 (mEq/L)    Potassium 5.3 (*) 3.5 - 5.1 (mEq/L)    Chloride 91 (*) 96 - 112 (mEq/L)    CO2 21  19 - 32 (mEq/L)    Glucose, Bld 272 (*) 70 - 99 (mg/dL)    BUN 44 (*) 6 - 23 (mg/dL)    Creatinine, Ser 8.11 (*) 0.50 - 1.10 (mg/dL)    Calcium 8.6  8.4 - 10.5 (mg/dL)    GFR calc non Af Amer 44 (*) >90  (mL/min)    GFR calc Af Amer 51 (*) >90 (mL/min)   DIGOXIN LEVEL     Status: Abnormal   Collection Time   09/13/11  8:37 AM      Component Value Range Comment   Digoxin Level 0.7 (*) 0.8 - 2.0 (ng/mL)   TROPONIN I     Status: Normal   Collection Time   09/13/11  8:37 AM      Component Value Range Comment   Troponin I <0.30  <0.30 (ng/mL)     Radiology Reports: Dg Chest 2 View  09/13/2011  *RADIOLOGY REPORT*  Clinical Data: Shortness of breath.  CHEST - 2 VIEW  Comparison: 07/14/2011  Findings: Lung volumes are very low with bibasilar atelectasis present, left greater than right.  No edema, pulmonary consolidation or significant pleural fluid.  There is stable cardiomegaly and stable bulky calcifications within the heart likely at the level of the mitral annulus.  IMPRESSION: Low lung volumes with bibasilar atelectasis.  Original Report Authenticated By: Reola Calkins, M.D.    Electrocardiogram: Sinus rhythm at 80 beats per minute. Normal axis. Left bundle branch is present. Compared to old EKG this EKG is stable.  Assessment/Plan  Principal Problem:  *Dyspnea Active Problems:  Diabetes mellitus  Hypertension  CKD (chronic kidney disease), stage III  Hyponatremia  LBBB (left bundle branch block)  Chronic systolic heart failure  Back pain   #1 dyspnea: Etiology remains unclear. Definitely her exertion in the last couple days after being sedentary for almost 2 months could have caused it. Patient does have a history of DVT and has IVC filter. PE is still a possibility. X-ray does not show any infiltrate in the lung, nor any evidence for edema. Examination also did not suggest these. Does not appear to be an infectious process at this time. We will proceed with a VQ scan. We'll give her inhalers and nebulizer treatments. Hold off on steroids. If the VQ scan is negative and if she continues to have difficulty breathing, steroids may be considered as these symptoms could be reflective  of bronchitis.  #2 hyponatremia with hyperkalemia: She is on diuretics,  which could have contributed to this. She appears to be intravascularly depleted. She does have some pitting edema. Will give her gentle normal saline. Recheck her labs in the morning. Urine osmolality, urine sodium, and TSH will be checked as well. We'll be cautious with IV fluids, knowing that her EF is only about 20%. We will also give her a dose of Kayexalate.  #3 chronic kidney disease, stage III: Monitor renal function closely.  #4 chronic systolic heart failure. No evidence for acute exacerbation. Her EF is known to be 20%. She's not on ACE inhibitors due to renal insufficiency. She could be on Bidil, which should be considered. Digoxin will be continued for now though there is no clear indication for the same.  #5 Back pain: There is some tenderness to palpation in the lower thoracic and upper lumbar vertebrae. She's never had a MRI. We will proceed at this time with the plain x-ray films of the thoracic and lumbar spines. Depending on the results of those and, depending on patient's symptoms MRI will be considered. A UA will be checked to make, sure there is no UTI.  #6 diabetes, type II, on insulin: Continue with insulin. Sliding scale will be provided.  #7 hypertension, is stable. Continue to monitor blood pressures.  #8 anemia: Monitor hemoglobin. Anemia panel to be considered.  #9. She is a DO NOT RESUSCITATE based on my conversation with her daughter.  DVT, prophylaxis will be provided.  Further management decisions will depend on results of further testing and patient's response to treatment.  [facility]  [facility] Pager [phone]  09/13/2011, 10:47 AM

## 2011-09-13 NOTE — ED Provider Notes (Signed)
History    85yf with generalized weakness. Gradual onset yesterday. Associated with mild dyspnea. Denies acute pain. No fever ro chills. No urinary complaints. No v/d. No nausea. No acute trauma. Somewhat recent hospitalization with severe diverticular bleed requiring surgery. Now with colostomy and wound vac. Abdominal pain had been slowly improving. No blood in stool or melena. No recent med changes. Pt with very poor apetite.  CSN: [id number]  Arrival date & time 09/13/11  2130   First MD Initiated Contact with Patient 09/13/11 [phone]      Chief Complaint  Patient presents with  . Shortness of Breath  . Abdominal Pain    (Consider location/radiation/quality/duration/timing/severity/associated sxs/prior treatment) HPI  Past Medical History  Diagnosis Date  . Diabetes mellitus   . Hypertension   . Vascular disease   . Shortness of breath   . Bladder infection   . COPD (chronic obstructive pulmonary disease)   . Myocardial infarction   . Stroke   . GERD (gastroesophageal reflux disease)   . Diverticulitis     Past Surgical History  Procedure Date  . Bladder tack   . Femoral artery stent   . Abdominal hysterectomy   . Esophagogastroduodenoscopy 07/12/2011    Procedure: ESOPHAGOGASTRODUODENOSCOPY (EGD);  Surgeon: Theda Belfast, MD;  Location: [facility];  Service: Endoscopy;  Laterality: N/A;  bedside   . Flexible sigmoidoscopy 07/12/2011    Procedure: FLEXIBLE SIGMOIDOSCOPY;  Surgeon: Theda Belfast, MD;  Location: [facility];  Service: Endoscopy;  Laterality: N/A;  . Laparotomy 07/12/2011    Procedure: EXPLORATORY LAPAROTOMY;  Surgeon: Kandis Cocking, MD;  Location: [facility];  Service: General;  Laterality: N/A;  exploratory laparotomy, left sigmoid colectomy, creation of colostomy  . Abdominal surgery     History reviewed. No pertinent family history.  History  Substance Use Topics  . Smoking status: Never Smoker   . Smokeless tobacco: Never Used  . Alcohol Use:  No    OB History    Grav Para Term Preterm Abortions TAB SAB Ect Mult Living                  Review of Systems   Review of symptoms negative unless otherwise noted in HPI.   Allergies  Avelox; Coumadin; and Proton pump inhibitors  Home Medications   Current Outpatient Rx  Name Route Sig Dispense Refill  . IPRATROPIUM-ALBUTEROL 18-103 MCG/ACT IN AERO Inhalation Inhale 2 puffs into the lungs every 6 (six) hours as needed. For shortness of breath    . ALPRAZOLAM 0.25 MG PO TABS Oral Take 0.25 mg by mouth at bedtime.    Marland Kitchen VITAMIN C 100 MG PO TABS Oral Take 100 mg by mouth daily.    . ATORVASTATIN CALCIUM 20 MG PO TABS Oral Take 20 mg by mouth at bedtime.    Marland Kitchen DIGOXIN 0.125 MG PO TABS Oral Take 125 mcg by mouth every other day.    Marland Kitchen FAMOTIDINE 20 MG PO TABS Oral Take 20 mg by mouth at bedtime.    . FUROSEMIDE 40 MG PO TABS Oral Take 40 mg by mouth daily.     . INSULIN ISOPHANE HUMAN 100 UNIT/ML [place] SUSP Subcutaneous Inject into the skin. NPH 50/50    . GERITOL COMPLETE PO Oral Take 2 tablets by mouth daily.    . ISOSORBIDE MONONITRATE ER 30 MG PO TB24 Oral Take 15 mg by mouth daily.    Marland Kitchen METOPROLOL TARTRATE 25 MG PO TABS Oral Take 12.5 mg by mouth 2 (  two) times daily.    . MOMETASONE FUROATE 50 MCG/ACT NA SUSP Nasal Place 2 sprays into the nose at bedtime as needed. For congestion    . ICAPS PO CAPS Oral Take 1 capsule by mouth 2 (two) times daily.    . OMEGA-3-ACID ETHYL ESTERS 1 G PO CAPS Oral Take 2 g by mouth daily.    Marland Kitchen POTASSIUM CHLORIDE ER 10 MEQ PO TBCR Oral Take 10 mEq by mouth daily.    Marland Kitchen SALINE NASAL SPRAY 0.65 % NA SOLN Nasal Place 1 spray into the nose at bedtime as needed. For congestion      BP 154/70  Pulse 91  Temp(Src) 97.8 F (36.6 C) (Oral)  Resp 21  SpO2 100%  Physical Exam  Nursing note and vitals reviewed. Constitutional: She appears well-developed and well-nourished. No distress.       Laying in bed. NAD.  HENT:  Head: Normocephalic and  atraumatic.  Eyes: Conjunctivae are normal. Right eye exhibits no discharge. Left eye exhibits no discharge.  Neck: Neck supple.  Cardiovascular: Normal rate and regular rhythm.  Exam reveals no gallop and no friction rub.   Murmur heard.      Systolic murmur  Pulmonary/Chest: Breath sounds normal.       Tachypnea in mid 20s. Lungs clear.  Abdominal: Soft. She exhibits no distension. There is no tenderness.       Colostomy with soft brown stool. Wound vac to lower midline with serosang drainage.  Musculoskeletal: She exhibits edema. She exhibits no tenderness.       Mild pitting LE edema. L worse than R but per family this is stable.  Neurological: She is alert. No cranial nerve deficit. She exhibits normal muscle tone. Coordination normal.  Skin: Skin is warm and dry. She is not diaphoretic.  Psychiatric: She has a normal mood and affect. Her behavior is normal. Thought content normal.    ED Course  Procedures (including critical care time)  Labs Reviewed - No data to display Dg Chest 2 View  09/13/2011  *RADIOLOGY REPORT*  Clinical Data: Shortness of breath.  CHEST - 2 VIEW  Comparison: 07/14/2011  Findings: Lung volumes are very low with bibasilar atelectasis present, left greater than right.  No edema, pulmonary consolidation or significant pleural fluid.  There is stable cardiomegaly and stable bulky calcifications within the heart likely at the level of the mitral annulus.  IMPRESSION: Low lung volumes with bibasilar atelectasis.  Original Report Authenticated By: Reola Calkins, M.D.   EKG:  Rhythm:nsr Rate:88 Axis: Left Intervals: LAE. LBBB. ST segments: NS ST changes.   1. Hyponatremia   2. Generalized weakness       MDM  85yF with generalized weakness and fatigue. Work-up significant for hyponatremia which appears to be acute. Hyperglycemic but not enough to attribute to this and has previously had Na in 140s with glucose at this level. Probably multifacotrial. No  thiazide on med list. Hx of CHF but clinically not with significant fluid overload. Very poor nutrition recently. No significant AMS or seizure activity. Fatigue may be attributable to this and given acuity will admit for further eval.        Raeford Razor, MD 09/13/11 [phone]

## 2011-09-14 ENCOUNTER — Inpatient Hospital Stay (HOSPITAL_COMMUNITY): Payer: PRIVATE HEALTH INSURANCE

## 2011-09-14 LAB — COMPREHENSIVE METABOLIC PANEL
AST: 16 U/L (ref 0–37)
BUN: 42 mg/dL — ABNORMAL HIGH (ref 6–23)
CO2: 23 mEq/L (ref 19–32)
Chloride: 100 mEq/L (ref 96–112)
Creatinine, Ser: 1.3 mg/dL — ABNORMAL HIGH (ref 0.50–1.10)
GFR calc non Af Amer: 37 mL/min — ABNORMAL LOW (ref >90)
Total Bilirubin: 0.3 mg/dL (ref 0.3–1.2)

## 2011-09-14 LAB — GLUCOSE, CAPILLARY
Glucose-Capillary: 128 mg/dL — ABNORMAL HIGH (ref 70–99)
Glucose-Capillary: 112 mg/dL — ABNORMAL HIGH (ref 70–99)

## 2011-09-14 LAB — CBC
MCH: 24.7 pg — ABNORMAL LOW (ref 26.0–34.0)
Platelets: 229 10*3/uL (ref 150–400)
RBC: 3.28 MIL/uL — ABNORMAL LOW (ref 3.87–5.11)
WBC: 6.1 10*3/uL (ref 4.0–10.5)

## 2011-09-14 LAB — CARDIAC PANEL(CRET KIN+CKTOT+MB+TROPI): Troponin I: <0.3 ng/mL (ref <0.30)

## 2011-09-14 MED ORDER — FUROSEMIDE 40 MG PO TABS
40.0000 mg | ORAL_TABLET | Freq: Every day | ORAL | Status: DC
  Administered 2011-09-14 – 2011-09-15 (×2): 40 mg via ORAL
  Filled 2011-09-14 (×2): qty 1

## 2011-09-14 MED ORDER — SODIUM CHLORIDE 0.9 % IV BOLUS (SEPSIS)
250.0000 mL | Freq: Once | INTRAVENOUS | Status: AC
  Administered 2011-09-14: 250 mL via INTRAVENOUS

## 2011-09-14 NOTE — Progress Notes (Signed)
Patient seen and examined. Agree with above note. Will repeat CXR. Hydrate orally. Wary of more IVF due to CHF. Back pain is better. Continue to monitor closely. PT/OT.  Sollie Vultaggio 1:36 PM

## 2011-09-14 NOTE — Evaluation (Signed)
Clinical/Bedside Swallow Evaluation Patient Details  Name: AYSLIN KUNDERT MRN: [id number] Date of Birth: 1925/05/29  Today's Date: 09/14/2011 Time: 7829-5621 SLP Time Calculation (min): 36 min  Past Medical History:  Past Medical History  Diagnosis Date  . Diabetes mellitus   . Hypertension   . Vascular disease   . Shortness of breath   . Bladder infection   . COPD (chronic obstructive pulmonary disease)   . Myocardial infarction   . Stroke   . GERD (gastroesophageal reflux disease)   . Diverticulitis    Past Surgical History:  Past Surgical History  Procedure Date  . Bladder tack   . Femoral artery stent   . Abdominal hysterectomy   . Esophagogastroduodenoscopy 07/12/2011    Procedure: ESOPHAGOGASTRODUODENOSCOPY (EGD);  Surgeon: Theda Belfast, MD;  Location: [facility];  Service: Endoscopy;  Laterality: N/A;  bedside   . Flexible sigmoidoscopy 07/12/2011    Procedure: FLEXIBLE SIGMOIDOSCOPY;  Surgeon: Theda Belfast, MD;  Location: [facility];  Service: Endoscopy;  Laterality: N/A;  . Laparotomy 07/12/2011    Procedure: EXPLORATORY LAPAROTOMY;  Surgeon: Kandis Cocking, MD;  Location: [facility];  Service: General;  Laterality: N/A;  exploratory laparotomy, left sigmoid colectomy, creation of colostomy  . Abdominal surgery    HPI:  78 yo female adm to [facility] with Chief Complaint: Shortness of breath, and then back pain.  PMH + for COPD, HTN, sigmoid colectomy, CVA, GERD, bladder infection.  Pt is on 2 liters of oxygen at home but daughter reports she takes if off as needed.  Daugher reports MD to order another CXR due to pt's coughing with liquids - she states previous CXR was negative for pna - showed mild edema and stable cardiomegaly.  Pt anemic, hyponatremic, has UTI and some dyspnea.  Daughter Myrene Buddy reports pt coughing at home over the last two weeks with liquids which has improved with medical improvement.  Family had attempted thickener in drink at home but acknowledge pt  would not drink it.        Assessment / Plan / Recommendation Clinical Impression  Pt observed with lunch - fish, pinto beans, pudding and Sprite - pt consumed approx 25% without any signs of symptoms of aspiration.  Swallow appeared delayed but protective of airway in general during lunch.  Due to pt's multiple medical issues and her chronic dysphagia, asp risk if chronic and rec modify diet/strategies as indicated.  At completion of meal, pt consumed water with immediate weak cough after swallow.  SLP advised daughter to possible aspiration of water - but not carbonated drinks d/t incr sensory input.   Daughter Myrene Buddy reports swallow has improved significantly over the last few days.  Pt's family manages pt's dysphagia at home attempting thickener when dysphagia was worse= but unfortunately Myrene Buddy stated pt would not drink thickened drinks.  Provided a different thickener option to daughter - clear - for prn use.  Advised daughter of option to use carbonated drinks or NATURALLY nectar thick drinks *Buttermilk, V8, Tomato Juice* DURING meals to prevent overt aspiration.  Consume thin water between meals after oral care to decr dehydration risks and for wound healing.  Myrene Buddy also reports mother getting water at home when lying down (*in the middle of the night). causing her to cough.  Advised for pt to be fully upright and fully alert to maximize safety.  Rec continue diet with strict precautions.  Pt for repeat CXR today per MD order, SLP will follow up next date to assure  all education completed and make modifications as needed to diet - daughter Myrene Buddy agreeable.  Thanks for the referral.         Aspiration Risk    Moderate and chronic   Diet Recommendation Regular;Thin liquid   Other  Recommendations     Follow Up Recommendations    None beyond acute care.    Frequency and Duration min 1 x/week  1 week   Pertinent Vitals/Pain All stable    SLP Swallow Goals Patient will utilize recommended  strategies during swallow to increase swallowing safety with: Moderate assistance  Family will use strategies.     Swallow Study Prior Functional Status   Pt is fed at home by family, swallow function better now than in the last few weeks per daughter.     General Date of Onset: 09/14/11 HPI: 78 yo female adm to [facility] with Chief Complaint: Shortness of breath, and then back pain.  PMH + for COPD, HTN, sigmoid colectomy, CVA, GERD, bladder infection.  Pt is on 2 liters of oxygen at home but daughter reports she takes if off as needed.  Daugher reports MD to order another CXR due to pt's coughing with liquids - she states previous CXR was negative for pna - showed mild edema and stable cardiomegaly.  Pt anemic, hyponatremic, has UTI and some dyspnea.  Daughter Myrene Buddy reports pt coughing at home over the last two weeks with liquids which has improved with medical improvement.  Family had attempted thickener in drink at home but acknowledge pt would not drink it.        Type of Study: Bedside swallow evaluation Previous Swallow Assessment: H/o being evaluated at bedside by this SLP Diet Prior to this Study: Regular;Thin liquids Temperature Spikes Noted: No Respiratory Status: Supplemental O2 delivered via (comment) Behavior/Cognition: Alert;Cooperative;Requires cueing Oral Cavity - Dentition: Missing dentition Vision: Impaired for self-feeding (pt self feeds liquids at home but family gives solids ) Patient Positioning: Upright in chair Baseline Vocal Quality: Clear Volitional Cough: Weak Volitional Swallow: Unable to elicit    Oral/Motor/Sensory Function Overall Oral Motor/Sensory Function: Other (comment) (generally weak) Lingual ROM: Reduced left   Ice Chips Ice chips: Not tested   Thin Liquid Thin Liquid: Impaired Pharyngeal  Phase Impairments: Delayed Swallow;Cough - Immediate Other Comments: sprite tolerated well t/o meal, cough observed with water after meal complete, suspect faster  swallow initiation with carbonated beverages and possible aspiration of water    Nectar Thick Nectar Thick Liquid: Not tested   Honey Thick Honey Thick Liquid: Not tested   Puree Puree: Impaired Presentation: Spoon Oral Phase Functional Implications: Prolonged oral transit Pharyngeal Phase Impairments: Delayed Swallow Other Comments: 2 bites of pudding   Solid Solid: Impaired Other Comments: mildly decr mastication - delay that pt manages well - fish fed by daughter - no clinical s/s of aspiration with solids    Donavan Burnet, MS [facility] SLP [phone]

## 2011-09-14 NOTE — Progress Notes (Addendum)
OT Cancellation Note  Treatment cancelled today due to pt too sleepy to participate at this time. OT role explained to daughter.  Pt has 24/7 assist at home. Will recheck on pt later in day or next day.  Alba Cory 09/14/2011, 11:18 AM

## 2011-09-14 NOTE — Evaluation (Signed)
Physical Therapy Evaluation Patient Details Name: [name] MRN: [id number] DOB: Aug 19, 1925 Today's Date: 09/14/2011 Time: 1206-1232 PT Time Calculation (min): 26 min  PT Assessment / Plan / Recommendation Clinical Impression  Pt is 78 yo female with recent colostomy who presents with dyspnea, UTI, and back pain (though back pain has resolved).  Recommend 24 hr assist at home which daughter has already been proviiding and HHPT.  PT will benefit from acute PT to help pt increase strength and mobility OOB to improve pt's safety and independence and decrease caregiver burden upon d/c.    PT Assessment  Patient needs continued PT services    Follow Up Recommendations  Home health PT;Supervision/Assistance - 24 hour    Equipment Recommendations  None recommended by PT    Frequency Min 3X/week    Precautions / Restrictions Precautions Precautions: Fall Precaution Comments: pt is blind Restrictions Weight Bearing Restrictions: No Other Position/Activity Restrictions: pt with abdominal wound vac   Pertinent Vitals/Pain VSS, O2 removed for transfer and then replaced      Mobility  Bed Mobility Bed Mobility: Rolling Right;Right Sidelying to Sit;Sitting - Scoot to [place] of Bed Rolling Right: 1: +2 Total assist Rolling Right: Patient Percentage: 50% Right Sidelying to Sit: 1: +2 Total assist;With rails;HOB flat Right Sidelying to Sit: Patient Percentage: 50% Sitting - Scoot to Edge of Bed: 1: +2 Total assist Sitting - Scoot to Edge of Bed: Patient Percentage: 30% Details for Bed Mobility Assistance: pt having difficulty initiating mvmts, vc's to reach for bed rail (pt hindered partly due to blindness), hand over hand cueing to get bed rail.  Assistance to get pt fully into SL position for transition to sitting.  Pt's sitting balance poor, assistance needed to wt-shift right and left to scoot to EOB Transfers Transfers: Sit to Stand;Stand to Sit;Stand Pivot Transfers Sit to Stand:  1: +2 Total assist;From bed;With upper extremity assist Sit to Stand: Patient Percentage: 40% Stand to Sit: 1: +2 Total assist;To chair/3-in-1;With upper extremity assist Stand to Sit: Patient Percentage: 40% Stand Pivot Transfers: 1: +2 Total assist Stand Pivot Transfers: Patient Percentage: 50% Details for Transfer Assistance: Pt did not participate in initial attempt to stand.  On second attempt, did show quad and hip extensor activation but still needed +2 assist to achieve full standing.  With SPT, manual facilitation give at hips for wt-shifting.  At first, each foot had to be assisted to step but after 4 steps, pt able to take small pivot steps with vc's and facilitation for wt-shifting. Ambulation/Gait Ambulation/Gait Assistance: Other (comment) (unable) Stairs: No Wheelchair Mobility Wheelchair Mobility: No    Exercises General Exercises - Lower Extremity Ankle Circles/Pumps: AROM;Both;20 reps;Seated   PT Goals Acute Rehab PT Goals PT Goal Formulation: With patient/family Time For Goal Achievement: 09/28/11 Potential to Achieve Goals: Good Pt will go Supine/Side to Sit: with supervision PT Goal: Supine/Side to Sit - Progress: Goal set today Pt will Sit at Edge of Bed: with supervision;6-10 min;with no upper extremity support PT Goal: Sit at Edge Of Bed - Progress: Goal set today Pt will go Sit to Supine/Side: with supervision PT Goal: Sit to Supine/Side - Progress: Goal set today Pt will go Sit to Stand: with min assist PT Goal: Sit to Stand - Progress: Goal set today Pt will go Stand to Sit: with min assist PT Goal: Stand to Sit - Progress: Goal set today Pt will Transfer Bed to Chair/Chair to Bed: with min assist PT Transfer Goal: Bed to Chair/Chair  to Bed - Progress: Goal set today Pt will Ambulate: with min assist;with rolling walker;1 - 15 feet PT Goal: Ambulate - Progress: Goal set today Pt will Perform Home Exercise Program: with supervision, verbal cues  required/provided PT Goal: Perform Home Exercise Program - Progress: Goal set today  Visit Information  Last PT Received On: 09/14/11 Assistance Needed: +2    Subjective Data  Subjective: pt wants to get out of the bed.  She reports that abdomen in sore after wond care just performed. Patient Stated Goal: to return home   Prior Functioning  Home Living Lives With: Daughter (other daughter helps as well) Available Help at Discharge: Family;Available 24 hours/day Type of Home: House Home Access: Stairs to enter [employer] of Steps: 2 Entrance Stairs-Rails: None Home Layout: One level Bathroom Shower/Tub: Nurse, adult Accessibility: Yes How Accessible: Accessible via wheelchair;Accessible via walker Home Adaptive Equipment: Walker - rolling;Walker - four wheeled;Wheelchair - Careers adviser (comment);Tub transfer bench (bed rails) Additional Comments: pt's daughter bumps her up the 2 steps into home, which has worked out [employer], she reports that the w/c is lightweight.  Daughter has been helping pt to transfer bed to chair recently as mother has been able. Prior Function Level of Independence: Needs assistance Needs Assistance: Transfers;Gait;Toileting;Grooming;Dressing;Bathing;Meal Prep;Light Housekeeping Meal Prep: Total Light Housekeeping: Total Gait Assistance: daughter assists pt with ambulating short diatnce with rollator Transfer Assistance: daughter assists pt with transfers to and from w/c Able to Take Stairs?: No Driving: No Vocation: Retired Musician: No difficulties;Other (comment) (decreased verbalization)    Cognition  Overall Cognitive Status: History of cognitive impairments - at baseline Arousal/Alertness: Awake/alert Orientation Level: Time;Place Behavior During Session: WFL for tasks performed Cognition - Other Comments: poor historian    Extremity/Trunk Assessment Right Upper Extremity Assessment RUE ROM/Strength/Tone:  WFL for tasks assessed Left Upper Extremity Assessment LUE ROM/Strength/Tone: WFL for tasks assessed Right Lower Extremity Assessment RLE ROM/Strength/Tone: Deficits RLE ROM/Strength/Tone Deficits: hip flexion 2+/5, knee ext 3/5 RLE Sensation: WFL - Proprioception RLE Coordination: WFL - gross motor Left Lower Extremity Assessment LLE ROM/Strength/Tone: Deficits LLE ROM/Strength/Tone Deficits: hip flex 2+/5, knee ext 3/5 LLE Coordination: WFL - gross motor Trunk Assessment Trunk Assessment: Kyphotic   Balance Balance Balance Assessed: Yes Static Sitting Balance Static Sitting - Balance Support: Bilateral upper extremity supported;Feet supported Static Sitting - Level of Assistance: 3: Mod assist (as little as min-guard at times, post and right lean)  End of Session PT - End of Session Equipment Utilized During Treatment: Gait belt Activity Tolerance: Patient limited by fatigue Patient left: in chair;with call bell/phone within reach;with family/visitor present Nurse Communication: Mobility status   [name], [place] 09/14/2011, 2:57 PM  Lyanne Co, PT  Acute Rehab Services  [phone]

## 2011-09-14 NOTE — Progress Notes (Signed)
   CARE MANAGEMENT NOTE 09/14/2011  Patient:  REMINGTYN, DEPAOLA   Account Number:  [id number]  Date Initiated:  09/14/2011  Documentation initiated by:  Lanier Clam  Subjective/Objective Assessment:   ADMITTED W/SOB.HX:DVT,IVC FILTER,LLQ COLOSTOMY-ACTIVE W/[facility]-NEG PRESSURE WOUND THERAPY.     Action/Plan:   FROM HOME.   Anticipated DC Date:  09/18/2011   Anticipated DC Plan:  HOME W HOME HEALTH SERVICES         [facility] Choice  Resumption Of Svcs/PTA Provider   Choice offered to / List presented to:             Status of service:  In process, will continue to follow Medicare Important Message given?   (If response is "NO", the following Medicare IM given date fields will be blank) Date Medicare IM given:   Date Additional Medicare IM given:    Discharge Disposition:    Per UR Regulation:  Reviewed for med. necessity/level of care/duration of stay  If discussed at Long Length of Stay Meetings, dates discussed:    Comments:  09/14/11 Worthy Boschert RN,BSN NCM [phone] [facility] SUSAN DALE(KIAS[place]) CONTACTED ABOUT NWPT EQUIP IN [name] RM IN CLOSET FOR DTR TO PICK UP, TAKE HOME THEN BRING BACK TO HOSPITAL @ D/C TO BE REAPPLIED IF MD AGREE.

## 2011-09-14 NOTE — Progress Notes (Signed)
Subjective: Lying in bed eyes opened. Will follow commands. Not very verbal. Daughter at bedside assisting with breakfast  Objective: Vital signs Filed Vitals:   09/13/11 2054 09/13/11 2147 09/14/11 0431 09/14/11 0715  BP:  111/73 112/69   Pulse:  72 75   Temp:  97.9 F (36.6 C) 99.9 F (37.7 C)   TempSrc:  Axillary Oral   Resp:  18 18   Height:      Weight:      SpO2: 99% 99% 96% 90%   Weight change:  Last BM Date: 09/13/11  Intake/Output from previous day: 04/28 0701 - 04/29 0700 In: 1191.7 [P.O.:240; I.V.:651.7; IV Piggyback:300] Out: 526 [Urine:525; Stool:1]     Physical Exam: General: Alert, awake,  in no acute distress. Frail appearing.  HEENT: No bruits, no goiter. Mucus membranes mouth moist/pink, poor dentition.  Heart: Regular rate and rhythm, + murmur. No rubs, gallops.Trace LEE bilateral feet L>R.  Lungs:Normal effort, somewhat shallow. Breath sounds clear to auscultation bilaterally. No wheeze Abdomen: Soft, nontender, nondistended, positive bowel sounds. Colostomy with moderate amount air and liquid brown stool. Wound vac lower abdomen intact.  Extremities: No clubbing cyanosis  with positive pedal pulses. Trace LEE bilateral feet. Moves extremities  Neuro: Grossly intact, nonfocal. Not very verbal. Will follow commands. Upper extremity strength 4/5 left, 3/5 right.     Lab Results: Basic Metabolic Panel:  Basename 09/14/11 0625 09/13/11 1510  NA 132* 132*  K 4.5 5.4*  CL 100 99  CO2 23 25  GLUCOSE 213* 239*  BUN 42* 43*  CREATININE 1.30* 1.08  CALCIUM 7.5* 8.3*  MG -- --  PHOS -- --   Liver Function Tests:  [facility] 09/14/11 0625  AST 16  ALT 17  ALKPHOS 152*  BILITOT 0.3  PROT 5.9*  ALBUMIN 1.8*   No results found for this basename: LIPASE:2,AMYLASE:2 in the last 72 hours No results found for this basename: AMMONIA:2 in the last 72 hours CBC:  Basename 09/14/11 0625 09/13/11 0837  WBC 6.1 7.3  NEUTROABS -- --  HGB 8.1* 10.0*  HCT  27.1* 32.6*  MCV 82.6 82.1  PLT 229 259   Cardiac Enzymes:  Basename 09/14/11 0625 09/13/11 2219 09/13/11 1510  CKTOTAL 34 39 42  CKMB 2.5 3.0 3.2  CKMBINDEX -- -- --  TROPONINI <0.30 <0.30 <0.30   BNP: No results found for this basename: PROBNP:3 in the last 72 hours D-Dimer: No results found for this basename: DDIMER:2 in the last 72 hours CBG:  Basename 09/13/11 2131 09/13/11 1632  GLUCAP 176* 236*   Hemoglobin A1C:  Basename 09/13/11 1510  HGBA1C 7.2*   Fasting Lipid Panel: No results found for this basename: CHOL,HDL,LDLCALC,TRIG,CHOLHDL,LDLDIRECT in the last 72 hours Thyroid Function Tests:  Basename 09/13/11 1510  TSH 2.171  T4TOTAL --  FREET4 --  T3FREE --  THYROIDAB --   Anemia Panel: No results found for this basename: VITAMINB12,FOLATE,FERRITIN,TIBC,IRON,RETICCTPCT in the last 72 hours Coagulation: No results found for this basename: LABPROT:2,INR:2 in the last 72 hours Urine Drug Screen: Drugs of Abuse  No results found for this basename: labopia, cocainscrnur, labbenz, amphetmu, thcu, labbarb    Alcohol Level: No results found for this basename: ETH:2 in the last 72 hours Urinalysis:  Basename 09/13/11 1149  COLORURINE YELLOW  LABSPEC 1.018  PHURINE 6.0  GLUCOSEU NEGATIVE  HGBUR SMALL*  BILIRUBINUR NEGATIVE  KETONESUR NEGATIVE  PROTEINUR NEGATIVE  UROBILINOGEN 0.2  NITRITE NEGATIVE  LEUKOCYTESUR LARGE*   Misc. Labs:  No results found  for this or any previous visit (from the past 240 hour(s)).  Studies/Results: Dg Chest 2 View  09/13/2011  *RADIOLOGY REPORT*  Clinical Data: Shortness of breath.  CHEST - 2 VIEW  Comparison: 09/13/2011  Findings: Mild interstitial edema present.  No evidence of overt airspace edema.  Stable cardiomegaly.  No significant pleural effusions.  IMPRESSION: Mild interstitial edema.  Stable cardiomegaly.  Original Report Authenticated By: Reola Calkins, M.D.   Dg Chest 2 View  09/13/2011  *RADIOLOGY  REPORT*  Clinical Data: Shortness of breath.  CHEST - 2 VIEW  Comparison: 07/14/2011  Findings: Lung volumes are very low with bibasilar atelectasis present, left greater than right.  No edema, pulmonary consolidation or significant pleural fluid.  There is stable cardiomegaly and stable bulky calcifications within the heart likely at the level of the mitral annulus.  IMPRESSION: Low lung volumes with bibasilar atelectasis.  Original Report Authenticated By: Reola Calkins, M.D.   Dg Thoracic Spine 2 View  09/13/2011  *RADIOLOGY REPORT*  Clinical Data: Upper back pain.  No known injury.  THORACIC SPINE - 2 VIEW  Comparison: Chest radiographs dated 09/13/2011.  Findings: Mild and moderate spur formation throughout the majority of the thoracic spine.  No fractures or subluxations are seen. Mitral valve annulus calcifications are noted.  IMPRESSION: Degenerative changes.  No acute abnormality.  Original Report Authenticated By: Darrol Angel, M.D.   Dg Lumbar Spine 2-3 Views  09/13/2011  *RADIOLOGY REPORT*  Clinical Data: Low back pain.  No known injury.  LUMBAR SPINE - 2-3 VIEW  Comparison: Previous examinations.  Findings: Transitional thoracolumbar vertebra followed by five non- rib bearing lumbar vertebrae.  The last open disc space is labeled the L5-S1 level.  Progressive fusion of the L4 and L5 vertebral bodies since 07/07/2011.  Stable grade 1 anterolisthesis at the L3- 4 level.  Stable mild anterior spur formation at multiple levels of the lumbar and lower thoracic spine.  Facet degenerative changes throughout the lumbar spine.  No fractures seen.  Minimal scoliosis.  Stable inferior vena cava filter.  Atheromatous arterial calcifications.  IMPRESSION: Degenerative changes, as described above.  No acute abnormality.  Original Report Authenticated By: Darrol Angel, M.D.   Nm Pulmonary Perfusion  09/13/2011  *RADIOLOGY REPORT*  Clinical Data: Chest pain.  Shortness of breath.  Chronic kidney  disease.  NM PULMONARY PERFUSION PARTICULATE  Radiopharmaceutical: CURIE MAA TECHNETIUM TO 53M ALBUMIN AGGREGATED  Comparison: 06/20/2011.  Chest radiographs obtained earlier today.  Findings: Again demonstrated is normal perfusion of both lungs with no perfusion defects seen.  IMPRESSION: Normal examination.  No evidence of pulmonary embolism.  Original Report Authenticated By: Darrol Angel, M.D.    Medications: Scheduled Meds:   . albuterol  2.5 mg Nebulization Q6H  . ALPRAZolam  0.25 mg Oral QHS  . cefTRIAXone (ROCEPHIN)  IV  1 g Intravenous Q24H  . digoxin  125 mcg Oral QODAY  . insulin aspart  0-15 Units Subcutaneous TID WC  . insulin glargine  10 Units Subcutaneous QHS  . ipratropium  0.5 mg Nebulization Q6H  . metoprolol tartrate  12.5 mg Oral BID  .  morphine injection  4 mg Intravenous Once  . simvastatin  40 mg Oral q1800  . sodium chloride  250 mL Intravenous Once  . sodium polystyrene  15 g Oral Once  . sodium polystyrene  30 g Oral Once  . DISCONTD:  HYDROmorphone (DILAUDID) injection  1 mg Subcutaneous Once   Continuous Infusions:   .  sodium chloride 50 mL/hr at 09/13/11 1958  . DISCONTD: sodium chloride 10 mL/hr (09/13/11 1412)   PRN Meds:.acetaminophen, acetaminophen, albuterol, albuterol-ipratropium, morphine, ondansetron (ZOFRAN) IV, ondansetron, oxyCODONE, technetium albumin aggregated, DISCONTD: ondansetron (ZOFRAN) IV  Assessment/Plan:  Principal Problem:  *Dyspnea Active Problems:  Diabetes mellitus  Hypertension  CKD (chronic kidney disease), stage III  Hyponatremia  LBBB (left bundle branch block)  Chronic systolic heart failure  Back pain  #1 dyspnea: Etiology remains unclear. Much improved. Sats 90-99 on 2L.   VQ scan no evidence PE.  X-ray does not show any infiltrate in the lung, nor any evidence for edema. Examination also did not suggest these. Continue inhalers and nebulizer treatments. Daughter at bedside reports that PCP ordered nebs  for home but they "do not have the machine".   #2 hyponatremia with hyperkalemia: Hyperkalemia resolved s/p kayexalate.Hyponatremia (mild) unchanged.  She is on diuretics at home.which are being held. She appears to be intravascularly depleted. She does have some pitting edema particularly in her feet.  Urine osmolality 296, urine sodium 23, and TSH 2.1.  IV fluids at kvo, knowing that her EF is only about 20%.    #3 chronic kidney disease, stage III: chart review indicates baseline creatinine range 1.3-1.6. Currently at baseline.  Monitor renal function closely.   #4 chronic systolic heart failure. No evidence for acute exacerbation. Her EF is known to be 20%. She's not on ACE inhibitors due to renal insufficiency. She could be on Bidil, which should be considered. Digoxin will be continued for now though there is no clear indication for the same.   #5 Back pain: Improved. Pain x-ray films of the thoracic and lumbar spines yield no acute abnormality. Could be related to #6.   Will request PT/OT eval.   #6. UTI on rocephin day #2. Will await C&s. Afebrile, non-toxic appearing.   #7 diabetes, type II, on insulin: poor control. CBG range 176-236.  Hg A1C 7.2  Continue home insulin at lower dose. Appetite improved this am. If continues to eat well will consider resuming home dose 15units. Continue sliding scale   #7 hypertension. SBP 11-112.  Continue BB. Continue to monitor blood pressures.   #8 anemia: Hg 8.1 after IV fluids. Chart review indicates baseline 8.5-9.5. Likely of chronic disease. No s/sx active bleeding. Anemia panel 2/13 yields iron 32, sat ratio 11 and ferritin 331.  Monitor hemoglobin.     LOS: 1 day   [facility] M 09/14/2011, 8:46 AM

## 2011-09-14 NOTE — Consult Note (Signed)
WOC consult Note Reason for Consult:initiation of hospital NPWT device.  Patient is using Katr[employer] NPWT device at home; changed to [employer] V[employer]. While in house. Wound type: Surgical Pressure Ulcer POA: No Measurement:10cm x 1cm x 4cm  Wound JXB:JYNWG, pink, non-granulating at base.  Small (<5%) necrotic tissue at periphery at 11 o'clock. Drainage (amount, consistency, odor) small amount serosanguinous Periwound:intact Dressing procedure/placement/frequency:NPWT device dressing changes are due every M-W-F. LLQ Colostomy is close to [place] Field, so pouch changes may or may not have to be coordinated with those dressing changes.  WOC ostomy consult  Stoma type/location: LLQ COlostomy Stomal assessment/size: 1 and 1/4 inch round, pale pink stoma.  Elevated, os located in center and pointing upward. Peristomal assessment: intact, clear Treatment options for stomal/peristomal skin: None indicated Output light brown soft stool Ostomy pouching: 1pc. [facility] #[id number] [place] [phone]).  Uses ostomy clamp [place] #[phone].  I will not follow but will remain available as needed to this patient, her family, her medical and surgical teams and of course, her nurses. I will not follow.  Please re-consult if needed. Thanks, Ladona Mow, MSN, RN, [facility], CWOCN [phone])

## 2011-09-14 NOTE — Progress Notes (Signed)
Pt's UOP = 175 this shift. Urine remains cloudy with sediment. Lenny Pastel, NP notified, new orders received. Will monitor.

## 2011-09-14 NOTE — Progress Notes (Signed)
Inpatient Diabetes Program Recommendations  AACE/ADA: New Consensus Statement on Inpatient Glycemic Control (2009)  Target Ranges:  Prepandial:   less than 140 mg/dL      Peak postprandial:   less than 180 mg/dL (1-2 hours)      Critically ill patients:  140 - 180 mg/dL   Reason for Visit: Hyperglycemia  Results for HAIZLEY, CANNELLA (MRN [id number]) as of 09/14/2011 16:07  Ref. Range 09/13/2011 15:10  Hemoglobin A1C Latest Range: <5.7 % 7.2 (H)  Results for BELLANY, ELBAUM (MRN [id number]) as of 09/14/2011 16:07  Ref. Range 09/13/2011 16:32 09/13/2011 21:31 09/14/2011 08:02 09/14/2011 12:25  Glucose-Capillary Latest Range: 70-99 mg/dL 629 (H) 528 (H) 413 (H) 184 (H)    Inpatient Diabetes Program Recommendations Insulin - Basal: Increase Lantus to 15 units QHS  Note: Will follow.

## 2011-09-15 LAB — BASIC METABOLIC PANEL
CO2: 24 mEq/L (ref 19–32)
Chloride: 99 mEq/L (ref 96–112)
Potassium: 4.1 mEq/L (ref 3.5–5.1)
Sodium: 132 mEq/L — ABNORMAL LOW (ref 135–145)

## 2011-09-15 LAB — CBC
MCV: 83.6 fL (ref 78.0–100.0)
Platelets: 215 10*3/uL (ref 150–400)
RBC: 3.36 MIL/uL — ABNORMAL LOW (ref 3.87–5.11)
WBC: 7.4 10*3/uL (ref 4.0–10.5)

## 2011-09-15 LAB — GLUCOSE, CAPILLARY: Glucose-Capillary: 267 mg/dL — ABNORMAL HIGH (ref 70–99)

## 2011-09-15 MED ORDER — IPRATROPIUM BROMIDE 0.02 % IN SOLN: 0.5000 mg | Freq: Three times a day (TID) | RESPIRATORY_TRACT | Status: DC

## 2011-09-15 MED ORDER — FUROSEMIDE 40 MG PO TABS: 40.0000 mg | ORAL_TABLET | Freq: Every day | ORAL | Status: DC

## 2011-09-15 MED ORDER — ALBUTEROL SULFATE (5 MG/ML) 0.5% IN NEBU
2.5000 mg | INHALATION_SOLUTION | Freq: Three times a day (TID) | RESPIRATORY_TRACT | Status: DC
  Administered 2011-09-15 (×2): 2.5 mg via RESPIRATORY_TRACT
  Filled 2011-09-15 (×3): qty 0.5

## 2011-09-15 MED ORDER — ALBUTEROL SULFATE (5 MG/ML) 0.5% IN NEBU: 2.5000 mg | INHALATION_SOLUTION | Freq: Three times a day (TID) | RESPIRATORY_TRACT | Status: AC

## 2011-09-15 MED ORDER — CEPHALEXIN 250 MG PO CAPS: 250.0000 mg | ORAL_CAPSULE | Freq: Three times a day (TID) | ORAL | Status: DC

## 2011-09-15 MED ORDER — IPRATROPIUM BROMIDE 0.02 % IN SOLN
0.5000 mg | Freq: Three times a day (TID) | RESPIRATORY_TRACT | Status: DC
  Administered 2011-09-15 (×2): 0.5 mg via RESPIRATORY_TRACT
  Filled 2011-09-15 (×3): qty 2.5

## 2011-09-15 MED ORDER — CEPHALEXIN 500 MG PO CAPS: ORAL_CAPSULE | ORAL | Status: DC

## 2011-09-15 MED ORDER — CEPHALEXIN 500 MG PO CAPS
500.0000 mg | ORAL_CAPSULE | Freq: Two times a day (BID) | ORAL | Status: DC
  Administered 2011-09-15: 500 mg via ORAL
  Filled 2011-09-15 (×2): qty 1

## 2011-09-15 NOTE — Progress Notes (Signed)
Patient came in with shortness of breath which has improved. She does have a history of COPD. Her breath sounds are clear and diminished, she has a pulse ox of 94% on 2L nasal cannula. Pt home regimen is Combivent 2 Puffs PRN. RT will change to albuterol and atrovent nebs TID

## 2011-09-15 NOTE — Discharge Summary (Signed)
Patient was seen and examined. Agree with above note. See progress note from today as well. Patient is stable for discharge.  Nielle Duford 2:59 PM

## 2011-09-15 NOTE — Progress Notes (Signed)
[facility] to be D/C'd Home per MD order.  Discussed prescriptions and follow up appointments with the patient. Prescriptions given to patient, medication list explained in detail. Pt verbalized understanding.   Serenitee, Fuertes R  Home Medication Instructions ZOX:[id number]   Printed on:09/15/11 1804  Medication Information                    potassium chloride (K-DUR) 10 MEQ tablet Take 10 mEq by mouth daily.           ALPRAZolam (XANAX) 0.25 MG tablet Take 0.25 mg by mouth at bedtime.           albuterol-ipratropium (COMBIVENT) 18-103 MCG/ACT inhaler Inhale 2 puffs into the lungs every 6 (six) hours as needed. For shortness of breath           digoxin ([name]) 0.125 MG tablet Take 125 mcg by mouth every other day.           famotidine (PEPCID) 20 MG tablet Take 20 mg by mouth at bedtime.           Iron-Vitamins (GERITOL COMPLETE PO) Take 2 tablets by mouth daily.           Multiple Vitamins-Minerals (ICAPS) CAPS Take 1 capsule by mouth 2 (two) times daily.           atorvastatin (LIPITOR) 20 MG tablet Take 20 mg by mouth at bedtime.           metoprolol tartrate (LOPRESSOR) 25 MG tablet Take 12.5 mg by mouth 2 (two) times daily.           mometasone (NASONEX) 50 MCG/ACT nasal spray Place 2 sprays into the nose at bedtime as needed. For congestion           omega-3 acid ethyl esters (LOVAZA) 1 G capsule Take 2 g by mouth daily.           Ascorbic Acid (VITAMIN C) 100 MG tablet Take 100 mg by mouth daily.           sodium chloride (OCEAN) 0.65 % nasal spray Place 1 spray into the nose at bedtime as needed. For congestion           insulin detemir (LEVEMIR) 100 UNIT/ML injection Inject 0-15 Units into the skin See admin instructions. Patient on sliding scale in the morning and takes 15 units at bedtime           albuterol (PROVENTIL) (5 MG/ML) 0.5% nebulizer solution Take 0.5 mLs (2.5 mg total) by nebulization 3 (three) times daily.           cephALEXin (KEFLEX)  500 MG capsule Take for 7 more days.           furosemide (LASIX) 40 MG tablet Take 1 tablet (40 mg total) by mouth daily.           ipratropium (ATROVENT) 0.02 % nebulizer solution Take 2.5 mLs (0.5 mg total) by nebulization 3 (three) times daily.             Filed Vitals:   09/15/11 1422  BP: 133/78  Pulse: 76  Temp: 97.8 F (36.6 C)  Resp: 18    Skin clean, dry and intact without evidence of skin break down, no evidence of skin tears noted. IV catheter discontinued intact. Site without signs and symptoms of complications. Dressing and pressure applied. Pt denies pain at this time. No complaints noted. Pt was switched from [facility] VAC to her own personal  VAC with no problems or complaints.  An After Visit Summary was printed and given to the patient. Patient escorted via WC, and D/C home via private auto.  HILL, Nain Rudd 09/15/2011 6:04 PM

## 2011-09-15 NOTE — Progress Notes (Signed)
Speech Language Pathology Dysphagia Treatment Patient Details Name: JOYCLYN PLAZOLA MRN: [id number] DOB: [birth year] Today's Date: 09/15/2011 Time: 3086-5784 SLP Time Calculation (min): 10 min  Assessment / Plan / Recommendation Clinical Impression  SLP spoke to pt's daughter briefly regarding swallow function, results of CXR and dietary modification indication.  Daughter Myrene Buddy stated that her sister does not plan to purchase clear thickener currently due to expense.  Re-educated daughter to use of carbonated beverages or buttermilk/V8/Tomato juice if prevents overt coughing and consume thin water between meals after oral care to assure pt is hydrated.  Daughter stated understanding to information provided.  Also family has SLP contact number to use if needed.   CXR completed yesterday afternoon negative for acute issue.  Intake documented as 90% of lunch and pt is afebrile with lungs diminished.  Family does an excellent job of managing this pt's chronic dysphagia and modifying diet as needed.  No further SLP follow up needed.     Diet Recommendation    Regular/thin   SLP Plan All goals met      Swallowing Goals  SLP Swallowing Goals Swallow Study Goal #2 - Progress: Met  General Temperature Spikes Noted: No Respiratory Status: Supplemental O2 delivered via (comment) (2 liters) Oral Cavity - Dentition: Missing dentition  Dysphagia Treatment Treatment focused on: Patient/family/caregiver education Family/Caregiver Educated: Arts administrator Patient observed directly with PO's: No Reason PO's not observed: Other (comment) (pt receiving prayer from clergy with family)   Donavan Burnet, MS [facility] SLP [phone]

## 2011-09-15 NOTE — Progress Notes (Signed)
Subjective: Alert drinking juice. Denies pain/discomfort. Daughter at bedside.No events during night  Objective: Vital signs Filed Vitals:   09/14/11 1951 09/14/11 2133 09/15/11 0227 09/15/11 0505  BP:  145/79  122/70  Pulse:  87  76  Temp:  98.4 F (36.9 C)  98.9 F (37.2 C)  TempSrc:  Oral  Oral  Resp:  16  16  Height:      Weight:    57.5 kg (126 lb 12.2 oz)  SpO2: 94% 96% 92% 97%   Weight change: -1 kg (-2 lb 3.3 oz) Last BM Date: 09/13/11  Intake/Output from previous day: 04/29 0701 - 04/30 0700 In: 480 [P.O.:480] Out: 821 [Urine:820; Stool:1]     Physical Exam: General: Alert, awake,oriented to self and place, in no acute distress. Frail appearing HEENT: No bruits, no goiter. Mucus membranes mouth moist/pink. No erythema or exudate Heart: Regular rate and rhythm, without murmurs, rubs, gallops. Lungs: Normal effort. Breath sounds with fine crackles bases particularly left base, otherwise clear to auscultation bilaterally. No wheeze Abdomen: Soft, nontender, nondistended, positive bowel sounds. Colostomy with moderate amount air and small amount liquid stool  Extremities: No clubbing cyanosis  with positive pedal pulses. Bilateral feet with edema Neuro: Grossly intact, nonfocal.    Lab Results: Basic Metabolic Panel:  Basename 09/15/11 0430 09/14/11 0625  NA 132* 132*  K 4.1 4.5  CL 99 100  CO2 24 23  GLUCOSE 102* 213*  BUN 41* 42*  CREATININE 1.40* 1.30*  CALCIUM 7.6* 7.5*  MG -- --  PHOS -- --   Liver Function Tests:  Basename 09/14/11 0625  AST 16  ALT 17  ALKPHOS 152*  BILITOT 0.3  PROT 5.9*  ALBUMIN 1.8*   No results found for this basename: LIPASE:2,AMYLASE:2 in the last 72 hours No results found for this basename: AMMONIA:2 in the last 72 hours CBC:  Basename 09/15/11 0430 09/14/11 0625  WBC 7.4 6.1  NEUTROABS -- --  HGB 8.4* 8.1*  HCT 28.1* 27.1*  MCV 83.6 82.6  PLT 215 229   Cardiac Enzymes:  Basename 09/14/11 0625 09/13/11  2219 09/13/11 1510  CKTOTAL 34 39 42  CKMB 2.5 3.0 3.2  CKMBINDEX -- -- --  TROPONINI <0.30 <0.30 <0.30   BNP: No results found for this basename: PROBNP:3 in the last 72 hours D-Dimer: No results found for this basename: DDIMER:2 in the last 72 hours CBG:  Basename 09/15/11 0725 09/14/11 2136 09/14/11 1822 09/14/11 1225 09/14/11 0802 09/13/11 2131  GLUCAP 75 112* 128* 184* 210* 176*   Hemoglobin A1C:  Basename 09/13/11 1510  HGBA1C 7.2*   Fasting Lipid Panel: No results found for this basename: CHOL,HDL,LDLCALC,TRIG,CHOLHDL,LDLDIRECT in the last 72 hours Thyroid Function Tests:  Basename 09/13/11 1510  TSH 2.171  T4TOTAL --  FREET4 --  T3FREE --  THYROIDAB --   Anemia Panel: No results found for this basename: VITAMINB12,FOLATE,FERRITIN,TIBC,IRON,RETICCTPCT in the last 72 hours Coagulation: No results found for this basename: LABPROT:2,INR:2 in the last 72 hours Urine Drug Screen: Drugs of Abuse  No results found for this basename: labopia, cocainscrnur, labbenz, amphetmu, thcu, labbarb    Alcohol Level: No results found for this basename: ETH:2 in the last 72 hours Urinalysis:  Basename 09/13/11 1149  COLORURINE YELLOW  LABSPEC 1.018  PHURINE 6.0  GLUCOSEU NEGATIVE  HGBUR SMALL*  BILIRUBINUR NEGATIVE  KETONESUR NEGATIVE  PROTEINUR NEGATIVE  UROBILINOGEN 0.2  NITRITE NEGATIVE  LEUKOCYTESUR LARGE*   Misc. Labs:  No results found for this or any  previous visit (from the past 240 hour(s)).  Studies/Results: Dg Chest 2 View  09/13/2011  *RADIOLOGY REPORT*  Clinical Data: Shortness of breath.  CHEST - 2 VIEW  Comparison: 09/13/2011  Findings: Mild interstitial edema present.  No evidence of overt airspace edema.  Stable cardiomegaly.  No significant pleural effusions.  IMPRESSION: Mild interstitial edema.  Stable cardiomegaly.  Original Report Authenticated By: Reola Calkins, M.D.   Dg Chest 2 View  09/13/2011  *RADIOLOGY REPORT*  Clinical Data:  Shortness of breath.  CHEST - 2 VIEW  Comparison: 07/14/2011  Findings: Lung volumes are very low with bibasilar atelectasis present, left greater than right.  No edema, pulmonary consolidation or significant pleural fluid.  There is stable cardiomegaly and stable bulky calcifications within the heart likely at the level of the mitral annulus.  IMPRESSION: Low lung volumes with bibasilar atelectasis.  Original Report Authenticated By: Reola Calkins, M.D.   Dg Thoracic Spine 2 View  09/13/2011  *RADIOLOGY REPORT*  Clinical Data: Upper back pain.  No known injury.  THORACIC SPINE - 2 VIEW  Comparison: Chest radiographs dated 09/13/2011.  Findings: Mild and moderate spur formation throughout the majority of the thoracic spine.  No fractures or subluxations are seen. Mitral valve annulus calcifications are noted.  IMPRESSION: Degenerative changes.  No acute abnormality.  Original Report Authenticated By: Darrol Angel, M.D.   Dg Lumbar Spine 2-3 Views  09/13/2011  *RADIOLOGY REPORT*  Clinical Data: Low back pain.  No known injury.  LUMBAR SPINE - 2-3 VIEW  Comparison: Previous examinations.  Findings: Transitional thoracolumbar vertebra followed by five non- rib bearing lumbar vertebrae.  The last open disc space is labeled the L5-S1 level.  Progressive fusion of the L4 and L5 vertebral bodies since 07/07/2011.  Stable grade 1 anterolisthesis at the L3- 4 level.  Stable mild anterior spur formation at multiple levels of the lumbar and lower thoracic spine.  Facet degenerative changes throughout the lumbar spine.  No fractures seen.  Minimal scoliosis.  Stable inferior vena cava filter.  Atheromatous arterial calcifications.  IMPRESSION: Degenerative changes, as described above.  No acute abnormality.  Original Report Authenticated By: Darrol Angel, M.D.   Nm Pulmonary Perfusion  09/13/2011  *RADIOLOGY REPORT*  Clinical Data: Chest pain.  Shortness of breath.  Chronic kidney disease.  NM PULMONARY  PERFUSION PARTICULATE  Radiopharmaceutical: CURIE MAA TECHNETIUM TO 75M ALBUMIN AGGREGATED  Comparison: 06/20/2011.  Chest radiographs obtained earlier today.  Findings: Again demonstrated is normal perfusion of both lungs with no perfusion defects seen.  IMPRESSION: Normal examination.  No evidence of pulmonary embolism.  Original Report Authenticated By: Darrol Angel, M.D.   Dg Chest Port 1 View  09/14/2011  *RADIOLOGY REPORT*  Clinical Data: Shortness of breath, weakness  PORTABLE CHEST - 1 VIEW  Comparison: 09/13/2011  Findings: Expiratory frontal radiograph with vascular crowding and bibasilar atelectasis.  No definite pleural effusion or pneumothorax.  Cardiomegaly.  IMPRESSION: Expiratory frontal radiograph with bibasilar atelectasis.  Cardiomegaly.  Original Report Authenticated By: Charline Bills, M.D.    Medications: Scheduled Meds:   . albuterol  2.5 mg Nebulization TID  . ALPRAZolam  0.25 mg Oral QHS  . cefTRIAXone (ROCEPHIN)  IV  1 g Intravenous Q24H  . digoxin  125 mcg Oral QODAY  . furosemide  40 mg Oral Daily  . insulin aspart  0-15 Units Subcutaneous TID WC  . insulin glargine  10 Units Subcutaneous QHS  . ipratropium  0.5 mg Nebulization TID  .  metoprolol tartrate  12.5 mg Oral BID  . simvastatin  40 mg Oral q1800  . DISCONTD: albuterol  2.5 mg Nebulization Q6H  . DISCONTD: ipratropium  0.5 mg Nebulization Q6H   Continuous Infusions:  PRN Meds:.acetaminophen, acetaminophen, albuterol, albuterol-ipratropium, morphine, ondansetron (ZOFRAN) IV, ondansetron, oxyCODONE  Assessment/Plan:  Principal Problem:  *Dyspnea Active Problems:  Diabetes mellitus  Hypertension  CKD (chronic kidney disease), stage III  Hyponatremia  LBBB (left bundle branch block)  Chronic systolic heart failure  Back pain   #1 dyspnea: Etiology remains unclear. Resolved. Likely related to physical exertion just prior to admission after being sedentary for 2 months.   Sats remain  90-99 on 2L. Is on oxygen at home. VQ scan no evidence PE. Repeat X-ray shows atelectasis. Lasix resumed 4/29. Swallow eval neg for aspiration.  Continue inhalers and nebulizer treatments.    #2 hyponatremia with hyperkalemia: Hyperkalemia resolved s/p kayexalate.Hyponatremia (mild) remains 132.  Seems to be baseline. Lasix resumed 4/29   #3 chronic kidney disease, stage III: chart review indicates baseline creatinine range 1.3-1.6. Currently at baseline. Monitor renal function closely.   #4 chronic systolic heart failure. No evidence for acute exacerbation. Her EF is known to be 20%. She's not on ACE inhibitors due to renal insufficiency. She could be on Bidil, which should be considered. Digoxin will be continued for now though there is no clear indication for the same. Current wt 57.5kg wt on admission 58.5kg.   #5 Back pain: Improved. Pain x-ray films of the thoracic and lumbar spines yield no acute abnormality. Could be related to #6. Will request PT/OT eval.   #6. UTI on rocephin day #3.  Afebrile, non-toxic appearing. Change to oral antibiotics.  #7 diabetes, type II, on insulin: poor control. CBG range 176-236. Hg A1C 7.2 Continue home insulin at lower dose. Appetite improved this am. If continues to eat well will consider resuming home dose 15units. Continue sliding scale   #7 hypertension. SBP 11-112. Continue BB. Continue to monitor blood pressures.   #8 anemia: Hg 8.1 after IV fluids. Chart review indicates baseline 8.5-9.5. Likely of chronic disease. No s/sx active bleeding. Anemia panel 2/13 yields iron 32, sat ratio 11 and ferritin 331. Monitor hemoglobin.   #9 Dispo: Home with 24/7 care per family. Medically stable and hopefully home with next 24-48 hrs.      LOS: 2 days   [facility] M 09/15/2011, 7:39 AM  Patient seen and examined. Agree with above note. Patient appears to be back to her baseline. Daughter agrees.  Stable for discharge today to home with home  health. Will need oral antibiotics. Inhalers at home.  [name] 12:16 PM

## 2011-09-15 NOTE — Progress Notes (Signed)
OT explained the role of OT to pts caregiver. (Pt asleep). Caregiver performs all ADL activity and encourages pt to wash face and feed self.  Caregiver states she is comfortable with ADL activity with this patient and has all DME. OT will sign off. Lise Auer, OT

## 2011-09-15 NOTE — Discharge Summary (Signed)
Physician Discharge Summary  Patient ID: [name] MRN: [id number] DOB/AGE: [birth year] 78 y.o.  Admit date: 09/13/2011 Discharge date: 09/15/2011  Primary Care Physician:  Laurena Slimmer, MD, MD   Discharge Diagnoses:    Principal Problem:  *Dyspnea Active Problems:  Diabetes mellitus  Hypertension  CKD (chronic kidney disease), stage III  Hyponatremia  LBBB (left bundle branch block)  Chronic systolic heart failure  Back pain   Medication List  As of 09/15/2011  1:48 PM   STOP taking these medications         ciprofloxacin 500 MG tablet      isosorbide mononitrate 30 MG 24 hr tablet         TAKE these medications         albuterol (5 MG/ML) 0.5% nebulizer solution   Commonly known as: PROVENTIL   Take 0.5 mLs (2.5 mg total) by nebulization 3 (three) times daily.      albuterol-ipratropium 18-103 MCG/ACT inhaler   Commonly known as: COMBIVENT   Inhale 2 puffs into the lungs every 6 (six) hours as needed. For shortness of breath      ALPRAZolam 0.25 MG tablet   Commonly known as: XANAX   Take 0.25 mg by mouth at bedtime.      atorvastatin 20 MG tablet   Commonly known as: LIPITOR   Take 20 mg by mouth at bedtime.      cephALEXin 500 MG capsule   Commonly known as: KEFLEX   Take for 7 more days.      digoxin 0.125 MG tablet   Commonly known as: [name]   Take 125 mcg by mouth every other day.      famotidine 20 MG tablet   Commonly known as: PEPCID   Take 20 mg by mouth at bedtime.      furosemide 40 MG tablet   Commonly known as: LASIX   Take 1 tablet (40 mg total) by mouth daily.      GERITOL COMPLETE PO   Take 2 tablets by mouth daily.      ICAPS Caps   Take 1 capsule by mouth 2 (two) times daily.      insulin detemir 100 UNIT/ML injection   Commonly known as: LEVEMIR   Inject 0-15 Units into the skin See admin instructions. Patient on sliding scale in the morning and takes 15 units at bedtime      ipratropium 0.02 % nebulizer solution    Commonly known as: ATROVENT   Take 2.5 mLs (0.5 mg total) by nebulization 3 (three) times daily.      metoprolol tartrate 25 MG tablet   Commonly known as: LOPRESSOR   Take 12.5 mg by mouth 2 (two) times daily.      mometasone 50 MCG/ACT nasal spray   Commonly known as: NASONEX   Place 2 sprays into the nose at bedtime as needed. For congestion      omega-3 acid ethyl esters 1 G capsule   Commonly known as: LOVAZA   Take 2 g by mouth daily.      potassium chloride 10 MEQ tablet   Commonly known as: K-DUR   Take 10 mEq by mouth daily.      sodium chloride 0.65 % nasal spray   Commonly known as: OCEAN   Place 1 spray into the nose at bedtime as needed. For congestion      vitamin C 100 MG tablet   Take 100 mg by mouth daily.  Disposition and Follow-up: Pt medically stable and ready for discharge to home. Will follow up with PCP 1 week.   Consults:  Wound/Ostomy consult  Physical exam: see progress note dated 09/15/11    Significant Diagnostic Studies:    Labs Reviewed  CBC - Abnormal; Notable for the following:    Hemoglobin 10.0 (*)    HCT 32.6 (*)    MCH 25.2 (*)    RDW 18.9 (*)    All other components within normal limits  BASIC METABOLIC PANEL - Abnormal; Notable for the following:    Sodium 124 (*)    Potassium 5.3 (*)    Chloride 91 (*)    Glucose, Bld 272 (*)    BUN 44 (*)    Creatinine, Ser 1.13 (*)    GFR calc non Af Amer 44 (*)    GFR calc Af Amer 51 (*)    All other components within normal limits  DIGOXIN LEVEL - Abnormal; Notable for the following:    Digoxin Level 0.7 (*)    All other components within normal limits  URINALYSIS, ROUTINE W REFLEX MICROSCOPIC - Abnormal; Notable for the following:    APPearance TURBID (*)    Hgb urine dipstick SMALL (*)    Leukocytes, UA LARGE (*)    All other components within normal limits  OSMOLALITY, URINE - Abnormal; Notable for the following:    Osmolality, Ur 296 (*)    All other  components within normal limits  URINE MICROSCOPIC-ADD ON - Abnormal; Notable for the following:    Bacteria, UA FEW (*)    All other components within normal limits  BASIC METABOLIC PANEL - Abnormal; Notable for the following:    Sodium 132 (*)    Potassium 5.4 (*)    Glucose, Bld 239 (*)    BUN 43 (*)    Calcium 8.3 (*)    GFR calc non Af Amer 46 (*)    GFR calc Af Amer 53 (*)    All other components within normal limits  HEMOGLOBIN A1C - Abnormal; Notable for the following:    Hemoglobin A1C 7.2 (*)    Mean Plasma Glucose 160 (*)    All other components within normal limits  COMPREHENSIVE METABOLIC PANEL - Abnormal; Notable for the following:    Sodium 132 (*)    Glucose, Bld 213 (*)    BUN 42 (*)    Creatinine, Ser 1.30 (*)    Calcium 7.5 (*)    Total Protein 5.9 (*)    Albumin 1.8 (*)    Alkaline Phosphatase 152 (*)    GFR calc non Af Amer 37 (*)    GFR calc Af Amer 43 (*)    All other components within normal limits  CBC - Abnormal; Notable for the following:    RBC 3.28 (*)    Hemoglobin 8.1 (*)    HCT 27.1 (*)    MCH 24.7 (*)    MCHC 29.9 (*)    RDW 19.2 (*)    All other components within normal limits  GLUCOSE, CAPILLARY - Abnormal; Notable for the following:    Glucose-Capillary 236 (*)    All other components within normal limits  GLUCOSE, CAPILLARY - Abnormal; Notable for the following:    Glucose-Capillary 176 (*)    All other components within normal limits  GLUCOSE, CAPILLARY - Abnormal; Notable for the following:    Glucose-Capillary 210 (*)    All other components within normal limits  GLUCOSE, CAPILLARY -  Abnormal; Notable for the following:    Glucose-Capillary 184 (*)    All other components within normal limits  CBC - Abnormal; Notable for the following:    RBC 3.36 (*)    Hemoglobin 8.4 (*)    HCT 28.1 (*)    MCH 25.0 (*)    MCHC 29.9 (*)    RDW 19.3 (*)    All other components within normal limits  BASIC METABOLIC PANEL - Abnormal;  Notable for the following:    Sodium 132 (*)    Glucose, Bld 102 (*)    BUN 41 (*)    Creatinine, Ser 1.40 (*)    Calcium 7.6 (*)    GFR calc non Af Amer 34 (*)    GFR calc Af Amer 39 (*)    All other components within normal limits  GLUCOSE, CAPILLARY - Abnormal; Notable for the following:    Glucose-Capillary 128 (*)    All other components within normal limits  GLUCOSE, CAPILLARY - Abnormal; Notable for the following:    Glucose-Capillary 112 (*)    All other components within normal limits  GLUCOSE, CAPILLARY - Abnormal; Notable for the following:    Glucose-Capillary 136 (*)    All other components within normal limits  TROPONIN I  SODIUM, URINE, RANDOM  CARDIAC PANEL(CRET KIN+CKTOT+MB+TROPI)  CARDIAC PANEL(CRET KIN+CKTOT+MB+TROPI)  TSH  CARDIAC PANEL(CRET KIN+CKTOT+MB+TROPI)  GLUCOSE, CAPILLARY        Dg Chest 2 View  09/13/2011  *RADIOLOGY REPORT*  Clinical Data: Shortness of breath.  CHEST - 2 VIEW  Comparison: 09/13/2011  Findings: Mild interstitial edema present.  No evidence of overt airspace edema.  Stable cardiomegaly.  No significant pleural effusions.  IMPRESSION: Mild interstitial edema.  Stable cardiomegaly.  Original Report Authenticated By: Reola Calkins, M.D.   Dg Chest 2 View  09/13/2011  *RADIOLOGY REPORT*  Clinical Data: Shortness of breath.  CHEST - 2 VIEW  Comparison: 07/14/2011  Findings: Lung volumes are very low with bibasilar atelectasis present, left greater than right.  No edema, pulmonary consolidation or significant pleural fluid.  There is stable cardiomegaly and stable bulky calcifications within the heart likely at the level of the mitral annulus.  IMPRESSION: Low lung volumes with bibasilar atelectasis.  Original Report Authenticated By: Reola Calkins, M.D.   Dg Thoracic Spine 2 View  09/13/2011  *RADIOLOGY REPORT*  Clinical Data: Upper back pain.  No known injury.  THORACIC SPINE - 2 VIEW  Comparison: Chest radiographs dated  09/13/2011.  Findings: Mild and moderate spur formation throughout the majority of the thoracic spine.  No fractures or subluxations are seen. Mitral valve annulus calcifications are noted.  IMPRESSION: Degenerative changes.  No acute abnormality.  Original Report Authenticated By: Darrol Angel, M.D.   Dg Lumbar Spine 2-3 Views  09/13/2011  *RADIOLOGY REPORT*  Clinical Data: Low back pain.  No known injury.  LUMBAR SPINE - 2-3 VIEW  Comparison: Previous examinations.  Findings: Transitional thoracolumbar vertebra followed by five non- rib bearing lumbar vertebrae.  The last open disc space is labeled the L5-S1 level.  Progressive fusion of the L4 and L5 vertebral bodies since 07/07/2011.  Stable grade 1 anterolisthesis at the L3- 4 level.  Stable mild anterior spur formation at multiple levels of the lumbar and lower thoracic spine.  Facet degenerative changes throughout the lumbar spine.  No fractures seen.  Minimal scoliosis.  Stable inferior vena cava filter.  Atheromatous arterial calcifications.  IMPRESSION: Degenerative changes, as described above.  No acute abnormality.  Original Report Authenticated By: Darrol Angel, M.D.   Nm Pulmonary Perfusion  09/13/2011  *RADIOLOGY REPORT*  Clinical Data: Chest pain.  Shortness of breath.  Chronic kidney disease.  NM PULMONARY PERFUSION PARTICULATE  Radiopharmaceutical: CURIE MAA TECHNETIUM TO 26M ALBUMIN AGGREGATED  Comparison: 06/20/2011.  Chest radiographs obtained earlier today.  Findings: Again demonstrated is normal perfusion of both lungs with no perfusion defects seen.  IMPRESSION: Normal examination.  No evidence of pulmonary embolism.  Original Report Authenticated By: Darrol Angel, M.D.   Dg Chest Port 1 View  09/14/2011  *RADIOLOGY REPORT*  Clinical Data: Shortness of breath, weakness  PORTABLE CHEST - 1 VIEW  Comparison: 09/13/2011  Findings: Expiratory frontal radiograph with vascular crowding and bibasilar atelectasis.  No definite  pleural effusion or pneumothorax.  Cardiomegaly.  IMPRESSION: Expiratory frontal radiograph with bibasilar atelectasis.  Cardiomegaly.  Original Report Authenticated By: Charline Bills, M.D.       Brief H and P: For complete details please refer to admission H and P, but in brief   This is 78 year old, African American female, with a past medical history of, diabetes, hypertension, systolic heart failure, COPD, who recently underwent sigmoid colectomy Hartman's pouch, and colostomy placement. This was done in February for diverticular bleeding. Patient has a wound VAC. According to the daughters patient has not done much since this recent hospitalization. She's been pretty much bed bound. In the last couple days prior to admission on 09/13/11 her daughters had been trying to exercise her. And, then on 4/27 patient started complaining of some shortness of breath. She has oxygen at home, which was applied. Patient felt better and went to sleep. On 4/28 she woke up and again felt a little short of breath and asked to be brought into the hospital. She denied any chest pain. There had been no cough, but the daughter did mention that patient sometimes coughs when eating or drinking fluids. There had been some swelling in the left foot area and a wound is present in the lateral aide of the foot. No drainage has been noted.  While she was in the ED waiting for evaluation and admission patient started developing pain in the back. The daughter reported that she had similar back pain a few months ago, and had x-rays done, which showed arthritis in the back and the pain eventually resolved after she went home. Patient points to her mid to lower back. Pain was described as severe pain in the form of cramps. Did not have any weakness in the lower extremities.Reportedly she  drinks a lot of water at home, but not significantly large amount. Work up in the ed yields hyponatremia with hyperkalemia. She was admitted to  medicine service.    Hospital Course:   Principal Problem:  *Dyspnea Active Problems:  Diabetes mellitus  Hypertension  CKD (chronic kidney disease), stage III  Hyponatremia  LBBB (left bundle branch block)  Chronic systolic heart failure  Back pain #1 dyspnea: Likely related to physical exertion just prior to admission.  pt admitted to tele.  VQ scan no evidence PE. X-ray does not show any infiltrate in the lung, nor any evidence for edema. Examination also did not suggest these. She was continued on oxygen and provided with inhalers and nebulizer treatments.  Improved quickly. Repeat chest xray shows atelectasis. Lasix resumed 4/29 at lower dose. Swallow eval neg for aspiration. Will be discharged with nebs and inhaler.  #2 hyponatremia with hyperkalemia: Hyperkalemia resolved s/p  kayexalate that pt received in ED. She is on diuretics at home.which initially were held as she appeared to be intravascularly depleted. She did have some pitting edema particularly in her feet. Urine osmolality 296, urine sodium 23, and TSH 2.1. IV fluids very gently on first day then  at kvo, knowing that her EF is only about 20%. Her hyponatremia 132 during this hospitalization. Will need OP follow up with PCP #3 chronic kidney disease, stage III: chart review indicates baseline creatinine range 1.3-1.6. At discharge creatinine in baseline range. Will need follow up BMET evaluate renal function 1 week.  #4 chronic systolic heart failure. No evidence for acute exacerbation during this hospitalization. Marland Kitchen Her EF is known to be 20%. Her volume status negative for this hospitalization.  She's not on ACE inhibitors due to renal insufficiency.  Digoxin will be continued for now though there is no clear indication for the same.  #5 Back pain:. Plain x-ray films of the thoracic and lumbar spines yield no acute abnormality. Could be related to #6.  On day of discharge pt denied pain. Sitting up in chair eating lunch.    #6. UTI. Received IV rocephin for 3 doses.  Afebrile, non-toxic appearing appearing for this hospitalization. Will be discharged on po antibiotics to complete 10 days.  #7 diabetes, type II, on insulin: poor control. CBG range 176-236. Hg A1C 7.2 Continued home insulin at lower dose initally as pt appetite poor. At time of discharge appetite much improved. Will resume home regimen.   #7 hypertension. Inially held lasix and Imdur while BB continued. Pt gently hydrated IV fluids which were discontinued 4/29. Home lasix resumed at lower dose. At discharge will continue BB and lasix at lower dose.   #8 anemia: Hg 8.1 after IV fluids. Chart review indicates baseline 8.5-9.5. Likely of chronic disease. No s/sx active bleeding. Anemia panel 2/13 yields iron 32, sat ratio 11 and ferritin 331.      Time spent on Discharge: 40 minutes  Signed: Gwenyth Bender 09/15/2011, 1:48 PM

## 2011-09-15 NOTE — Progress Notes (Signed)
   CARE MANAGEMENT NOTE 09/15/2011  Patient:  [name], [name]   Account Number:  [id number]  Date Initiated:  09/14/2011  Documentation initiated by:  [facility]  Subjective/Objective Assessment:   ADMITTED W/SOB.HX:DVT,IVC FILTER,LLQ COLOSTOMY-ACTIVE W/[facility]-NEG PRESSURE WOUND THERAPY.     Action/Plan:   FROM HOME.   Anticipated DC Date:  09/18/2011   Anticipated DC Plan:  HOME W HOME HEALTH SERVICES         [facility] Choice  Resumption Of Svcs/PTA Provider   Choice offered to / List presented to:  C-4 Adult Children   DME arranged  NEBULIZER MACHINE      DME agency  [facility]     [facility] arranged  HH-1 RN      [facility] agency  [facility]   Status of service:  Completed, signed off Medicare Important Message given?   (If response is "NO", the following Medicare IM given date fields will be blank) Date Medicare IM given:   Date Additional Medicare IM given:    Discharge Disposition:  HOME W HOME HEALTH SERVICES  Per UR Regulation:  Reviewed for med. necessity/level of care/duration of stay  If discussed at Long Length of Stay Meetings, dates discussed:    Comments:  09/15/11 Iasiah Ozment RN,BSN NCM [phone] [facility] CONTACTED FOR RESUMING HHRN;DME-NEB MACHINE-THEY WILL DELIVER TO HOME.[name] DTR WILL PUT WOUND VAC TUBING ON WHEN SHE ARRIVES,VAC MACHINE IS IN THE [name] RM.[facility] HHRN WILL CHANGE DSG TOMORROW.  09/14/11 Rasheedah Reis RN,BSN NCM [phone] [facility] CONTACTED ABOUT NWPT EQUIP IN [name] RM IN CLOSET FOR DTR TO PICK UP, TAKE HOME THEN BRING BACK TO HOSPITAL @ D/C TO BE REAPPLIED IF MD AGREE.

## 2011-09-21 ENCOUNTER — Inpatient Hospital Stay (HOSPITAL_COMMUNITY)
Admission: AD | Admit: 2011-09-21 | Discharge: 2011-09-25 | DRG: 292 | Disposition: A | Discharge status: Home / Self Care | Payer: PRIVATE HEALTH INSURANCE | Source: Ambulatory Visit | Attending: Cardiovascular Disease | Admitting: Cardiovascular Disease

## 2011-09-21 ENCOUNTER — Encounter (HOSPITAL_COMMUNITY): Payer: Self-pay | Admitting: General Practice

## 2011-09-21 DIAGNOSIS — J811 Chronic pulmonary edema: Secondary | ICD-10-CM | POA: Diagnosis present

## 2011-09-21 DIAGNOSIS — I252 Old myocardial infarction: Secondary | ICD-10-CM

## 2011-09-21 DIAGNOSIS — I10 Essential (primary) hypertension: Secondary | ICD-10-CM | POA: Diagnosis present

## 2011-09-21 DIAGNOSIS — N183 Chronic kidney disease, stage 3 unspecified: Secondary | ICD-10-CM | POA: Diagnosis present

## 2011-09-21 DIAGNOSIS — K5900 Constipation, unspecified: Secondary | ICD-10-CM | POA: Diagnosis present

## 2011-09-21 DIAGNOSIS — J449 Chronic obstructive pulmonary disease, unspecified: Secondary | ICD-10-CM | POA: Diagnosis present

## 2011-09-21 DIAGNOSIS — I509 Heart failure, unspecified: Secondary | ICD-10-CM | POA: Diagnosis present

## 2011-09-21 DIAGNOSIS — G4700 Insomnia, unspecified: Secondary | ICD-10-CM | POA: Diagnosis present

## 2011-09-21 DIAGNOSIS — Z8673 Personal history of transient ischemic attack (TIA), and cerebral infarction without residual deficits: Secondary | ICD-10-CM

## 2011-09-21 DIAGNOSIS — F411 Generalized anxiety disorder: Secondary | ICD-10-CM | POA: Diagnosis present

## 2011-09-21 DIAGNOSIS — Z933 Colostomy status: Secondary | ICD-10-CM

## 2011-09-21 DIAGNOSIS — Z66 Do not resuscitate: Secondary | ICD-10-CM | POA: Diagnosis present

## 2011-09-21 DIAGNOSIS — I5043 Acute on chronic combined systolic (congestive) and diastolic (congestive) heart failure: Principal | ICD-10-CM | POA: Diagnosis present

## 2011-09-21 DIAGNOSIS — E875 Hyperkalemia: Secondary | ICD-10-CM | POA: Diagnosis present

## 2011-09-21 DIAGNOSIS — K219 Gastro-esophageal reflux disease without esophagitis: Secondary | ICD-10-CM | POA: Diagnosis present

## 2011-09-21 DIAGNOSIS — Z79899 Other long term (current) drug therapy: Secondary | ICD-10-CM

## 2011-09-21 DIAGNOSIS — Z794 Long term (current) use of insulin: Secondary | ICD-10-CM

## 2011-09-21 DIAGNOSIS — J4489 Other specified chronic obstructive pulmonary disease: Secondary | ICD-10-CM | POA: Diagnosis present

## 2011-09-21 DIAGNOSIS — I5041 Acute combined systolic (congestive) and diastolic (congestive) heart failure: Secondary | ICD-10-CM

## 2011-09-21 DIAGNOSIS — E119 Type 2 diabetes mellitus without complications: Secondary | ICD-10-CM | POA: Diagnosis present

## 2011-09-21 DIAGNOSIS — Z888 Allergy status to other drugs, medicaments and biological substances status: Secondary | ICD-10-CM

## 2011-09-21 DIAGNOSIS — I129 Hypertensive chronic kidney disease with stage 1 through stage 4 chronic kidney disease, or unspecified chronic kidney disease: Secondary | ICD-10-CM | POA: Diagnosis present

## 2011-09-21 DIAGNOSIS — D696 Thrombocytopenia, unspecified: Secondary | ICD-10-CM | POA: Diagnosis present

## 2011-09-21 DIAGNOSIS — E871 Hypo-osmolality and hyponatremia: Secondary | ICD-10-CM | POA: Diagnosis present

## 2011-09-21 LAB — URINALYSIS, ROUTINE W REFLEX MICROSCOPIC
Bilirubin Urine: NEGATIVE
Hgb urine dipstick: NEGATIVE
Ketones, ur: NEGATIVE mg/dL
Nitrite: NEGATIVE
Urobilinogen, UA: 0.2 mg/dL (ref 0.0–1.0)

## 2011-09-21 LAB — CBC
Hemoglobin: 10.5 g/dL — ABNORMAL LOW (ref 12.0–15.0)
MCH: 25.7 pg — ABNORMAL LOW (ref 26.0–34.0)
Platelets: 263 10*3/uL (ref 150–400)
RBC: 4.08 MIL/uL (ref 3.87–5.11)

## 2011-09-21 LAB — COMPREHENSIVE METABOLIC PANEL
AST: 20 U/L (ref 0–37)
BUN: 43 mg/dL — ABNORMAL HIGH (ref 6–23)
CO2: 19 mEq/L (ref 19–32)
Calcium: 8.6 mg/dL (ref 8.4–10.5)
Creatinine, Ser: 1.32 mg/dL — ABNORMAL HIGH (ref 0.50–1.10)
GFR calc Af Amer: 42 mL/min — ABNORMAL LOW (ref >90)
GFR calc non Af Amer: 36 mL/min — ABNORMAL LOW (ref >90)

## 2011-09-21 LAB — PROTIME-INR
INR: 1.45 (ref 0.00–1.49)
Prothrombin Time: 17.9 seconds — ABNORMAL HIGH (ref 11.6–15.2)

## 2011-09-21 LAB — DIFFERENTIAL
Basophils Absolute: 0.1 10*3/uL (ref 0.0–0.1)
Eosinophils Relative: 1 % (ref 0–5)
Lymphs Abs: 1.5 10*3/uL (ref 0.7–4.0)
Monocytes Absolute: 0.7 10*3/uL (ref 0.1–1.0)
Neutro Abs: 6.5 10*3/uL (ref 1.7–7.7)
Neutrophils Relative %: 73 % (ref 43–77)

## 2011-09-21 LAB — CARDIAC PANEL(CRET KIN+CKTOT+MB+TROPI): CK, MB: 4.6 ng/mL — ABNORMAL HIGH (ref 0.3–4.0)

## 2011-09-21 LAB — MAGNESIUM: Magnesium: 2.2 mg/dL (ref 1.5–2.5)

## 2011-09-21 MED ORDER — FAMOTIDINE 20 MG PO TABS
20.0000 mg | ORAL_TABLET | Freq: Every day | ORAL | Status: DC
  Administered 2011-09-21 – 2011-09-24 (×4): 20 mg via ORAL
  Filled 2011-09-21 (×5): qty 1

## 2011-09-21 MED ORDER — VITAMIN C 250 MG PO TABS
125.0000 mg | ORAL_TABLET | Freq: Every day | ORAL | Status: DC
  Administered 2011-09-22 – 2011-09-23 (×2): 125 mg via ORAL
  Administered 2011-09-24: 11:00:00 via ORAL
  Administered 2011-09-25: 125 mg via ORAL
  Filled 2011-09-21 (×4): qty 1

## 2011-09-21 MED ORDER — SODIUM CHLORIDE 0.9 % IJ SOLN: 3.0000 mL | INTRAMUSCULAR | Status: DC | PRN

## 2011-09-21 MED ORDER — ONDANSETRON HCL 4 MG/2ML IJ SOLN
4.0000 mg | Freq: Four times a day (QID) | INTRAMUSCULAR | Status: DC | PRN
Start: 2011-09-21 — End: 2011-09-25
  Administered 2011-09-23: 4 mg via INTRAVENOUS
  Filled 2011-09-21: qty 2

## 2011-09-21 MED ORDER — SODIUM CHLORIDE 0.9 % IV SOLN: 250.0000 mL | INTRAVENOUS | Status: DC | PRN

## 2011-09-21 MED ORDER — INSULIN DETEMIR 100 UNIT/ML ~~LOC~~ SOLN
15.0000 [IU] | Freq: Every day | SUBCUTANEOUS | Status: DC
  Administered 2011-09-21 – 2011-09-23 (×3): 15 [IU] via SUBCUTANEOUS
  Filled 2011-09-21: qty 10

## 2011-09-21 MED ORDER — FUROSEMIDE 10 MG/ML IJ SOLN
80.0000 mg | Freq: Two times a day (BID) | INTRAMUSCULAR | Status: AC
  Administered 2011-09-21 – 2011-09-24 (×6): 80 mg via INTRAVENOUS
  Filled 2011-09-21 (×6): qty 8

## 2011-09-21 MED ORDER — VITAMIN C 100 MG PO TABS: 100.0000 mg | ORAL_TABLET | Freq: Every day | ORAL | Status: DC

## 2011-09-21 MED ORDER — SODIUM CHLORIDE 0.9 % IJ SOLN
3.0000 mL | Freq: Two times a day (BID) | INTRAMUSCULAR | Status: DC
  Administered 2011-09-21 – 2011-09-25 (×7): 3 mL via INTRAVENOUS

## 2011-09-21 MED ORDER — IPRATROPIUM BROMIDE 0.02 % IN SOLN
0.5000 mg | Freq: Three times a day (TID) | RESPIRATORY_TRACT | Status: DC
  Administered 2011-09-21 – 2011-09-23 (×5): 0.5 mg via RESPIRATORY_TRACT
  Filled 2011-09-21 (×5): qty 2.5

## 2011-09-21 MED ORDER — ACETAMINOPHEN 325 MG PO TABS
650.0000 mg | ORAL_TABLET | ORAL | Status: DC | PRN
  Administered 2011-09-21 – 2011-09-25 (×9): 650 mg via ORAL
  Filled 2011-09-21 (×9): qty 2

## 2011-09-21 MED ORDER — POTASSIUM CHLORIDE CRYS ER 10 MEQ PO TBCR
10.0000 meq | EXTENDED_RELEASE_TABLET | Freq: Two times a day (BID) | ORAL | Status: DC
  Filled 2011-09-21: qty 1

## 2011-09-21 MED ORDER — INSULIN ASPART 100 UNIT/ML ~~LOC~~ SOLN
0.0000 [IU] | Freq: Three times a day (TID) | SUBCUTANEOUS | Status: DC
  Administered 2011-09-22 (×2): 2 [IU] via SUBCUTANEOUS
  Administered 2011-09-22: 1 [IU] via SUBCUTANEOUS
  Administered 2011-09-23: 2 [IU] via SUBCUTANEOUS
  Administered 2011-09-23 (×2): 3 [IU] via SUBCUTANEOUS
  Administered 2011-09-24 (×2): 1 [IU] via SUBCUTANEOUS
  Administered 2011-09-25: 3 [IU] via SUBCUTANEOUS
  Administered 2011-09-25: 2 [IU] via SUBCUTANEOUS

## 2011-09-21 MED ORDER — ALPRAZOLAM 0.25 MG PO TABS
0.2500 mg | ORAL_TABLET | Freq: Every day | ORAL | Status: DC
  Administered 2011-09-21 – 2011-09-23 (×3): 0.25 mg via ORAL
  Filled 2011-09-21 (×3): qty 1

## 2011-09-21 MED ORDER — ALBUTEROL SULFATE (5 MG/ML) 0.5% IN NEBU
2.5000 mg | INHALATION_SOLUTION | Freq: Three times a day (TID) | RESPIRATORY_TRACT | Status: DC
  Administered 2011-09-21 – 2011-09-23 (×4): 2.5 mg via RESPIRATORY_TRACT
  Filled 2011-09-21 (×5): qty 0.5

## 2011-09-21 MED ORDER — DIGOXIN 0.0625 MG HALF TABLET
0.0625 mg | ORAL_TABLET | Freq: Every day | ORAL | Status: DC
  Administered 2011-09-21 – 2011-09-25 (×5): 0.0625 mg via ORAL
  Filled 2011-09-21 (×5): qty 1

## 2011-09-21 MED ORDER — SIMVASTATIN 20 MG PO TABS
20.0000 mg | ORAL_TABLET | Freq: Every day | ORAL | Status: DC
  Administered 2011-09-21 – 2011-09-25 (×5): 20 mg via ORAL
  Filled 2011-09-21 (×5): qty 1

## 2011-09-21 MED ORDER — METOPROLOL TARTRATE 12.5 MG HALF TABLET
12.5000 mg | ORAL_TABLET | Freq: Two times a day (BID) | ORAL | Status: DC
  Administered 2011-09-21 – 2011-09-25 (×8): 12.5 mg via ORAL
  Filled 2011-09-21 (×9): qty 1

## 2011-09-21 NOTE — H&P (Signed)
[name] is an 78 y.o. female.   Chief Complaint: Shortness of breath and leg swelling.  HPI: 78 years old black female was recently discharged on lower dose of lasix last week, now has leg edema and shortness of breath.  Past Medical History  Diagnosis Date  . Diabetes mellitus   . Hypertension   . Vascular disease   . Shortness of breath   . Bladder infection   . COPD (chronic obstructive pulmonary disease)   . Myocardial infarction   . Stroke   . GERD (gastroesophageal reflux disease)   . Diverticulitis       Past Surgical History  Procedure Date  . Bladder tack   . Femoral artery stent   . Abdominal hysterectomy   . Esophagogastroduodenoscopy 07/12/2011    Procedure: ESOPHAGOGASTRODUODENOSCOPY (EGD);  Surgeon: Theda Belfast, MD;  Location: [facility];  Service: Endoscopy;  Laterality: N/A;  bedside   . Flexible sigmoidoscopy 07/12/2011    Procedure: FLEXIBLE SIGMOIDOSCOPY;  Surgeon: Theda Belfast, MD;  Location: [facility];  Service: Endoscopy;  Laterality: N/A;  . Laparotomy 07/12/2011    Procedure: EXPLORATORY LAPAROTOMY;  Surgeon: Kandis Cocking, MD;  Location: [facility];  Service: General;  Laterality: N/A;  exploratory laparotomy, left sigmoid colectomy, creation of colostomy  . Abdominal surgery     No family history on file. Social History:  reports that she has never smoked. She has never used smokeless tobacco. She reports that she does not drink alcohol or use illicit drugs.  Allergies:  Allergies  Allergen Reactions  . Avelox (Moxifloxacin Hcl In Nacl)   . Proton Pump Inhibitors Other (See Comments)    Thrombocytopenia  . Warfarin Sodium Other (See Comments)    Causes severe bleeding    Medications Prior to Admission  Medication Sig Dispense Refill  . albuterol (PROVENTIL) (5 MG/ML) 0.5% nebulizer solution Take 0.5 mLs (2.5 mg total) by nebulization 3 (three) times daily.  20 mL  0  . albuterol-ipratropium (COMBIVENT) 18-103 MCG/ACT inhaler  Inhale 2 puffs into the lungs every 6 (six) hours as needed. For shortness of breath      . ALPRAZolam (XANAX) 0.25 MG tablet Take 0.25 mg by mouth at bedtime.      . Ascorbic Acid (VITAMIN C) 100 MG tablet Take 100 mg by mouth daily.      Marland Kitchen atorvastatin (LIPITOR) 20 MG tablet Take 20 mg by mouth at bedtime.      . cephALEXin (KEFLEX) 500 MG capsule Take 500 mg by mouth 2 (two) times daily. For 7 days; Start date unknown      . digoxin ([name]) 0.125 MG tablet Take 125 mcg by mouth every other day.      . famotidine (PEPCID) 20 MG tablet Take 20 mg by mouth at bedtime.      . furosemide (LASIX) 40 MG tablet Take 1 tablet (40 mg total) by mouth daily.  30 tablet  0  . insulin detemir (LEVEMIR) 100 UNIT/ML injection Inject 0-15 Units into the skin See admin instructions. Patient on sliding scale in the morning and takes 15 units at bedtime      . ipratropium (ATROVENT) 0.02 % nebulizer solution Take 2.5 mLs (0.5 mg total) by nebulization 3 (three) times daily.  75 mL  0  . Iron-Vitamins (GERITOL COMPLETE PO) Take 2 tablets by mouth daily.      . metoprolol tartrate (LOPRESSOR) 25 MG tablet Take 12.5 mg by mouth 2 (two) times daily.      Marland Kitchen  mometasone (NASONEX) 50 MCG/ACT nasal spray Place 2 sprays into the nose at bedtime as needed. For congestion      . Multiple Vitamins-Minerals (ICAPS) CAPS Take 1 capsule by mouth 2 (two) times daily.      Marland Kitchen omega-3 acid ethyl esters (LOVAZA) 1 G capsule Take 1 g by mouth daily.       . potassium chloride (K-DUR) 10 MEQ tablet Take 10 mEq by mouth daily.      . sodium chloride (OCEAN) 0.65 % nasal spray Place 1 spray into the nose at bedtime as needed. For congestion      . DISCONTD: cephALEXin (KEFLEX) 500 MG capsule Take for 7 more days.  14 capsule  0    No results found for this or any previous visit (from the past 48 hour(s)). No results found.  @ROS @ + weight gain,  Wears glasses, + bilateral cataract surgery, + asthma, + chest pain, + chf, +  gastrointestinal bleed, + arthritis, No stroke, seizures or psych admission.  Blood pressure 142/60, pulse 71, temperature 97.9 F (36.6 C), temperature source Axillary, resp. rate 17, SpO2 93.00%. General appearance: Mild respiratory distress and moderate discomfort  Head: Normocephalic, atraumatic. Eyes: Brown, pale conjunctivae/corneas clear. PERRL, EOM's intact.  Neck: no adenopathy, no carotid bruit, + JVD, supple, symmetrical, trachea midline and thyroid not enlarged, symmetric, no tenderness/mass/nodules  Resp: Crackles to auscultation bilaterally  Cardio: S1, S2 is normal. Regular. III/VI Systolic murmur appreciated over the precordium. No rubs.  GI: There is a colostomy bag as well as a wound VAC. No specific tenderness was present in the abdomen. Bowel sounds are present.  Extremities: There is 2+ pitting edema in the lower extremities.  Pulses: 1+ and symmetric  Skin: Clammy with color, texture, turgor normal. No rashes or lesions  Lymph nodes: Cervical, supraclavicular, and axillary nodes normal.  Neurologic: She is awake and alert, but speaks very little. She is moving all extremities and no focal deficits appreciated.   Assessment/Plan Acute on chronic systolic and diastolic left heart failure. DM, II Hypertension CKD, stage III  IV lasix Home meds Oxygen DNR  [name] 09/21/2011, 5:38 PM

## 2011-09-21 NOTE — Progress Notes (Signed)
Pt arrived to unit. Pt placed on tele, VS taken, and assessed. IV attempted and unsuccessful. Foley placed. Will continue to monitor.

## 2011-09-21 NOTE — Progress Notes (Signed)
Pt K=6.0 and CKMG 4.6. Dr. Algie Coffer notified. Order to d/c PO KCL. Will continue to monitor pt.

## 2011-09-21 NOTE — Progress Notes (Signed)
Dr. Roseanne Kaufman office notified of pt's arrival to floor. Per office, Dr. Algie Coffer is en route to hospital. Will continue to monitor and await orders.

## 2011-09-22 ENCOUNTER — Inpatient Hospital Stay (HOSPITAL_COMMUNITY): Payer: PRIVATE HEALTH INSURANCE

## 2011-09-22 LAB — HEMOGLOBIN A1C
Hgb A1c MFr Bld: 7.6 % — ABNORMAL HIGH (ref <5.7)
Mean Plasma Glucose: 171 mg/dL — ABNORMAL HIGH (ref <117)

## 2011-09-22 LAB — CARDIAC PANEL(CRET KIN+CKTOT+MB+TROPI)
CK, MB: 4.1 ng/mL — ABNORMAL HIGH (ref 0.3–4.0)
CK, MB: 3.8 ng/mL (ref 0.3–4.0)
Relative Index: INVALID (ref 0.0–2.5)
Total CK: 48 U/L (ref 7–177)
Troponin I: <0.3 ng/mL (ref <0.30)
Troponin I: <0.3 ng/mL (ref <0.30)

## 2011-09-22 LAB — BASIC METABOLIC PANEL
Calcium: 8.3 mg/dL — ABNORMAL LOW (ref 8.4–10.5)
GFR calc Af Amer: 42 mL/min — ABNORMAL LOW (ref >90)
GFR calc non Af Amer: 36 mL/min — ABNORMAL LOW (ref >90)
Glucose, Bld: 212 mg/dL — ABNORMAL HIGH (ref 70–99)
Potassium: 5.4 mEq/L — ABNORMAL HIGH (ref 3.5–5.1)
Sodium: 123 mEq/L — ABNORMAL LOW (ref 135–145)

## 2011-09-22 LAB — GLUCOSE, CAPILLARY
Glucose-Capillary: 219 mg/dL — ABNORMAL HIGH (ref 70–99)
Glucose-Capillary: 141 mg/dL — ABNORMAL HIGH (ref 70–99)
Glucose-Capillary: 188 mg/dL — ABNORMAL HIGH (ref 70–99)
Glucose-Capillary: 287 mg/dL — ABNORMAL HIGH (ref 70–99)

## 2011-09-22 MED ORDER — HALOPERIDOL LACTATE 5 MG/ML IJ SOLN
0.5000 mg | Freq: Once | INTRAMUSCULAR | Status: AC
  Administered 2011-09-22: 0.5 mg via INTRAVENOUS
  Filled 2011-09-22: qty 0.1

## 2011-09-22 NOTE — Progress Notes (Signed)
Pt is trying to get OOB of bed and yelling out. MD made aware, new order given will continue to monitor. Sharlene Dory, RN

## 2011-09-22 NOTE — Progress Notes (Signed)
Subjective:  Breathing better. Did not sleep last night.  Objective:  Vital Signs in the last 24 hours: Temp:  [97.5 F (36.4 C)-98.3 F (36.8 C)] 97.5 F (36.4 C) (05/07 0612) Pulse Rate:  [67-77] 67  (05/07 0612) Cardiac Rhythm:  [-] Normal sinus rhythm (05/06 2015) Resp:  [16-18] 16  (05/07 0612) BP: (114-142)/(54-75) 114/54 mmHg (05/07 0612) SpO2:  [93 %-100 %] 96 % (05/07 0845) Weight:  [64 kg (141 lb 1.5 oz)-65 kg (143 lb 4.8 oz)] 64 kg (141 lb 1.5 oz) (05/07 0612)  Physical Exam: BP Readings from Last 1 Encounters:  09/22/11 114/54    Wt Readings from Last 1 Encounters:  09/22/11 64 kg (141 lb 1.5 oz)    Weight change:   HEENT: [place]/AT, Eyes-Brown, PERL, EOMI, Conjunctiva-Pale pink, Sclera-Non-icteric Neck: + JVD, No bruit, Trachea midline. Lungs:  Clear, Bilateral. Cardiac:  Regular rhythm, normal S1 and S2, no S3.  Abdomen:  Soft, non-tender. Extremities:  2 + edema present. No cyanosis. No clubbing. CNS: AxOx3, Cranial nerves grossly intact, moves all 4 extremities. Right handed. Skin: Warm and dry.   Intake/Output from previous day: 05/06 0701 - 05/07 0700 In: 8 [IV Piggyback:8] Out: 950 [Urine:950]    Lab Results: BMET    Component Value Date/Time   NA 123* 09/22/2011 0148   K 5.4* 09/22/2011 0148   CL 92* 09/22/2011 0148   CO2 19 09/22/2011 0148   GLUCOSE 212* 09/22/2011 0148   BUN 45* 09/22/2011 0148   CREATININE 1.33* 09/22/2011 0148   CALCIUM 8.3* 09/22/2011 0148   GFRNONAA 36* 09/22/2011 0148   GFRAA 42* 09/22/2011 0148   CBC    Component Value Date/Time   WBC 8.9 09/21/2011 1756   WBC 9.1 08/23/2009 0819   RBC 4.08 09/21/2011 1756   RBC 4.42 08/23/2009 0819   HGB 10.5* 09/21/2011 1756   HGB 12.6 08/23/2009 0819   HCT 33.9* 09/21/2011 1756   HCT 38.4 08/23/2009 0819   PLT 263 09/21/2011 1756   PLT 243 08/23/2009 0819   MCV 83.1 09/21/2011 1756   MCV 86.8 08/23/2009 0819   MCH 25.7* 09/21/2011 1756   MCH 28.4 08/23/2009 0819   MCHC 31.0 09/21/2011 1756   MCHC 32.7 08/23/2009 0819   RDW 20.2* 09/21/2011 1756   RDW 16.1* 08/23/2009 0819   LYMPHSABS 1.5 09/21/2011 1756   LYMPHSABS 1.4 08/23/2009 0819   MONOABS 0.7 09/21/2011 1756   MONOABS 0.7 08/23/2009 0819   EOSABS 0.1 09/21/2011 1756   EOSABS 0.3 08/23/2009 0819   BASOSABS 0.1 09/21/2011 1756   BASOSABS 0.0 08/23/2009 0819   CARDIAC ENZYMES Lab Results  Component Value Date   CKTOTAL 48 09/22/2011   CKMB 4.1* 09/22/2011   TROPONINI <0.30 09/22/2011    Assessment/Plan:  Patient Active Hospital Problem List: Acute combined systolic and diastolic congestive heart failure (06/25/2011)   Assessment: Improving   Plan: Continue diuresis Pulmonary edema (06/25/2011)   Assessment: As above Diabetes mellitus (06/20/2011)   Assessment: Stable Hyponatremia (06/25/2011)   Assessment: Unchanged   Plan: Continue diuresis Hypertension (06/20/2011)   Assessment: Stable       LOS: 1 day    Orpah Cobb  MD  09/22/2011, 9:21 AM

## 2011-09-22 NOTE — Progress Notes (Signed)
Heart Failure Core Measure Documentation  Assessment: [name] has an EF documented as 20% on 2/13 by ECHO  This patient is not currently on an ACEI or ARB for HF.  ACEI or ARB is contraindicated (specify with X) __ACEI allergy AND ARB allergy __Angioedema __ Moderate or severe aortic stenosis __Hyperkalemia __Hypotension __Renal artery stenosis _X_  Worsening renal function, preexisting renal disease or dysfunction - per MD note CKD III  Rational: Heart failure patients with left ventricular systolic dysfunction (LVSD) and an EF < 40% should be prescribed an angiotensin converting enzyme i nhibitor (ACEI) or anangiotensin receptor blocker (ARB) at discharge unless a contraindication is documented in the medical record.  This documentation is being placed in the record in order to provide the best possible care to our heart failure patients, reduce LOS and meet the [facility] standards.    Marcelino Scot 09/22/2011 11:37 AM

## 2011-09-22 NOTE — Progress Notes (Signed)
  Echocardiogram 2D Echocardiogram has been performed.  Mercy Moore 09/22/2011, 5:27 PM

## 2011-09-23 LAB — BASIC METABOLIC PANEL
CO2: 22 mEq/L (ref 19–32)
Calcium: 8.4 mg/dL (ref 8.4–10.5)
GFR calc Af Amer: 43 mL/min — ABNORMAL LOW (ref >90)
Sodium: 127 mEq/L — ABNORMAL LOW (ref 135–145)

## 2011-09-23 LAB — GLUCOSE, CAPILLARY
Glucose-Capillary: 198 mg/dL — ABNORMAL HIGH (ref 70–99)
Glucose-Capillary: 157 mg/dL — ABNORMAL HIGH (ref 70–99)

## 2011-09-23 MED ORDER — POTASSIUM CHLORIDE CRYS ER 10 MEQ PO TBCR
10.0000 meq | EXTENDED_RELEASE_TABLET | Freq: Every day | ORAL | Status: DC
  Administered 2011-09-23 – 2011-09-24 (×2): 10 meq via ORAL
  Filled 2011-09-23 (×3): qty 1

## 2011-09-23 MED ORDER — ZOLPIDEM TARTRATE 5 MG PO TABS
5.0000 mg | ORAL_TABLET | Freq: Every evening | ORAL | Status: DC | PRN
  Administered 2011-09-23 (×2): 5 mg via ORAL
  Filled 2011-09-23 (×2): qty 1

## 2011-09-23 MED ORDER — ADULT MULTIVITAMIN W/MINERALS CH
1.0000 | ORAL_TABLET | Freq: Every day | ORAL | Status: DC
  Administered 2011-09-23 – 2011-09-25 (×3): 1 via ORAL
  Filled 2011-09-23 (×4): qty 1

## 2011-09-23 MED ORDER — IPRATROPIUM BROMIDE 0.02 % IN SOLN
0.5000 mg | Freq: Two times a day (BID) | RESPIRATORY_TRACT | Status: DC
  Administered 2011-09-24 – 2011-09-25 (×2): 0.5 mg via RESPIRATORY_TRACT
  Filled 2011-09-23 (×5): qty 2.5

## 2011-09-23 MED ORDER — DIPHENHYDRAMINE HCL 25 MG PO CAPS: 25.0000 mg | ORAL_CAPSULE | Freq: Every evening | ORAL | Status: DC | PRN

## 2011-09-23 MED ORDER — ALBUTEROL SULFATE (5 MG/ML) 0.5% IN NEBU: 2.5000 mg | INHALATION_SOLUTION | RESPIRATORY_TRACT | Status: DC | PRN

## 2011-09-23 MED ORDER — ALBUTEROL SULFATE (5 MG/ML) 0.5% IN NEBU
2.5000 mg | INHALATION_SOLUTION | Freq: Two times a day (BID) | RESPIRATORY_TRACT | Status: DC
  Administered 2011-09-24 – 2011-09-25 (×2): 2.5 mg via RESPIRATORY_TRACT
  Filled 2011-09-23 (×5): qty 0.5

## 2011-09-23 MED ORDER — GLUCERNA SHAKE PO LIQD
237.0000 mL | Freq: Two times a day (BID) | ORAL | Status: DC
  Administered 2011-09-23 – 2011-09-25 (×4): 237 mL via ORAL

## 2011-09-23 NOTE — Progress Notes (Signed)
09/23/2011 Attie Nawabi [facility] Case Management Note [phone]  Utilization review completed.  

## 2011-09-23 NOTE — Progress Notes (Signed)
Subjective:  Feeling better. Sleeping well with Ambien use.  Moderate LV dysfunction with Mild MS, moderate to severe MR, TR and Severe pulmonary hypertension on 2-D echo.  Objective:  Vital Signs in the last 24 hours: Temp:  [97.3 F (36.3 C)-97.7 F (36.5 C)] 97.3 F (36.3 C) (05/08 0448) Pulse Rate:  [69-95] 95  (05/07 2100) Cardiac Rhythm:  [-]  Resp:  [16-18] 18  (05/08 0448) BP: (101-140)/(52-78) 140/78 mmHg (05/08 0448) SpO2:  [100 %] 100 % (05/08 0923) Weight:  [64 kg (141 lb 1.5 oz)] 64 kg (141 lb 1.5 oz) (05/08 0448)  Physical Exam: BP Readings from Last 1 Encounters:  09/23/11 140/78    Wt Readings from Last 1 Encounters:  09/23/11 64 kg (141 lb 1.5 oz)    Weight change: -1 kg (-2 lb 3.3 oz)  HEENT: [place]/AT, Eyes-Brown, PERL, EOMI, Conjunctiva-Pale pink, Sclera-Non-icteric Neck: No JVD, No bruit, Trachea midline. Lungs:  Clear, Bilateral. Cardiac:  Regular rhythm, normal S1 and S2, no S3. II/VI systolic and diastolic murmur Abdomen:  Soft, non-tender. Extremities:  1 + edema present. No cyanosis. No clubbing. CNS: AxOx3, Cranial nerves grossly intact, moves all 4 extremities. Right handed. Skin: Warm and dry.   Intake/Output from previous day: 05/07 0701 - 05/08 0700 In: 724 [P.O.:721; I.V.:3] Out: 1925 [Urine:1925]    Lab Results: BMET    Component Value Date/Time   NA 127* 09/23/2011 0523   K 4.4 09/23/2011 0523   CL 94* 09/23/2011 0523   CO2 22 09/23/2011 0523   GLUCOSE 160* 09/23/2011 0523   BUN 47* 09/23/2011 0523   CREATININE 1.30* 09/23/2011 0523   CALCIUM 8.4 09/23/2011 0523   GFRNONAA 37* 09/23/2011 0523   GFRAA 43* 09/23/2011 0523   CBC    Component Value Date/Time   WBC 8.9 09/21/2011 1756   WBC 9.1 08/23/2009 0819   RBC 4.08 09/21/2011 1756   RBC 4.42 08/23/2009 0819   HGB 10.5* 09/21/2011 1756   HGB 12.6 08/23/2009 0819   HCT 33.9* 09/21/2011 1756   HCT 38.4 08/23/2009 0819   PLT 263 09/21/2011 1756   PLT 243 08/23/2009 0819   MCV 83.1 09/21/2011 1756   MCV 86.8  08/23/2009 0819   MCH 25.7* 09/21/2011 1756   MCH 28.4 08/23/2009 0819   MCHC 31.0 09/21/2011 1756   MCHC 32.7 08/23/2009 0819   RDW 20.2* 09/21/2011 1756   RDW 16.1* 08/23/2009 0819   LYMPHSABS 1.5 09/21/2011 1756   LYMPHSABS 1.4 08/23/2009 0819   MONOABS 0.7 09/21/2011 1756   MONOABS 0.7 08/23/2009 0819   EOSABS 0.1 09/21/2011 1756   EOSABS 0.3 08/23/2009 0819   BASOSABS 0.1 09/21/2011 1756   BASOSABS 0.0 08/23/2009 0819   CARDIAC ENZYMES Lab Results  Component Value Date   CKTOTAL 48 09/22/2011   CKMB 3.8 09/22/2011   TROPONINI <0.30 09/22/2011    Assessment/Plan:  Patient Active Hospital Problem List:  Acute combined systolic and diastolic congestive heart failure (06/25/2011) Assessment: Improving Plan: Continue diuresis Pulmonary edema (06/25/2011) Assessment: As above Diabetes mellitus (06/20/2011) Assessment: Stable Hyponatremia (06/25/2011) Assessment: Unchanged Plan: Continue diuresis Hypertension (06/20/2011) Assessment: Stable AV thrombus/Vegetation Family to discuss, currently against heparin and coumadin use due to GI bleed issues and dislike of coumadin use and h/o recurrent thrombocytopenia. May chose comfort care. Prognosis poor.   LOS: 2 days    Orpah Cobb  MD  09/23/2011, 11:18 AM

## 2011-09-23 NOTE — Plan of Care (Signed)
Problem: Phase I Progression Outcomes Goal: EF % per last Echo/documented,Core Reminder form on chart Outcome: Completed/Met Date Met:  09/23/11 30-35% (09/2011)

## 2011-09-23 NOTE — Progress Notes (Signed)
INITIAL ADULT NUTRITION ASSESSMENT Date: 09/23/2011   Time: 12:21 PM Reason for Assessment: consult  ASSESSMENT: [name]  Dx: Acute combined systolic and diastolic congestive heart failure  Hx:  Past Medical History  Diagnosis Date  . Diabetes mellitus   . Hypertension   . Vascular disease   . Shortness of breath   . Bladder infection   . COPD (chronic obstructive pulmonary disease)   . Myocardial infarction   . Stroke   . GERD (gastroesophageal reflux disease)   . Diverticulitis    Past Surgical History  Procedure Date  . Bladder tack   . Femoral artery stent   . Abdominal hysterectomy   . Esophagogastroduodenoscopy 07/12/2011    Procedure: ESOPHAGOGASTRODUODENOSCOPY (EGD);  Surgeon: Theda Belfast, MD;  Location: [facility];  Service: Endoscopy;  Laterality: N/A;  bedside   . Flexible sigmoidoscopy 07/12/2011    Procedure: FLEXIBLE SIGMOIDOSCOPY;  Surgeon: Theda Belfast, MD;  Location: [facility];  Service: Endoscopy;  Laterality: N/A;  . Laparotomy 07/12/2011    Procedure: EXPLORATORY LAPAROTOMY;  Surgeon: Kandis Cocking, MD;  Location: [facility];  Service: General;  Laterality: N/A;  exploratory laparotomy, left sigmoid colectomy, creation of colostomy  . Abdominal surgery   . Colostomy   . Eye surgery      Related Meds:     . albuterol  2.5 mg Nebulization BID  . ALPRAZolam  0.25 mg Oral QHS  . digoxin  0.0625 mg Oral Daily  . famotidine  20 mg Oral QHS  . furosemide  80 mg Intravenous Q12H  . insulin aspart  0-9 Units Subcutaneous TID WC  . insulin detemir  15 Units Subcutaneous QHS  . ipratropium  0.5 mg Nebulization BID  . metoprolol tartrate  12.5 mg Oral BID  . potassium chloride  10 mEq Oral Daily  . simvastatin  20 mg Oral Daily  . sodium chloride  3 mL Intravenous Q12H  . vitamin C  125 mg Oral Daily  . DISCONTD: albuterol  2.5 mg Nebulization TID  . DISCONTD: ipratropium  0.5 mg Nebulization TID     Ht: 5\' 3"  (160 cm)  Wt: 141 lb 1.5  oz (64 kg) (bed wt)  Ideal Wt: 52.3 kg % Ideal Wt: 122%  Usual Wt:  Wt Readings from Last 20 Encounters:  09/23/11 141 lb 1.5 oz (64 kg)  09/15/11 126 lb 12.2 oz (57.5 kg)  07/15/11 148 lb 9.4 oz (67.4 kg)  07/15/11 148 lb 9.4 oz (67.4 kg)  07/15/11 148 lb 9.4 oz (67.4 kg)  06/30/11 159 lb 6.3 oz (72.3 kg)   % Usual Wt: 95%  Body mass index is 24.99 kg/(m^2). WNL  Food/Nutrition Related Hx: Per pts daughter, po intake was poor but now improving, weight is near normal.   Labs:  CMP     Component Value Date/Time   NA 127* 09/23/2011 0523   K 4.4 09/23/2011 0523   CL 94* 09/23/2011 0523   CO2 22 09/23/2011 0523   GLUCOSE 160* 09/23/2011 0523   BUN 47* 09/23/2011 0523   CREATININE 1.30* 09/23/2011 0523   CALCIUM 8.4 09/23/2011 0523   PROT 6.8 09/21/2011 1756   ALBUMIN 2.5* 09/21/2011 1756   AST 20 09/21/2011 1756   ALT 14 09/21/2011 1756   ALKPHOS 171* 09/21/2011 1756   BILITOT 0.6 09/21/2011 1756   GFRNONAA 37* 09/23/2011 0523   GFRAA 43* 09/23/2011 0523    Intake/Output Summary (Last 24 hours) at 09/23/11 1225 Last data filed  at 09/23/11 1050  Gross per 24 hour  Intake    582 ml  Output   1800 ml  Net  -1218 ml     Diet Order: Carb Control, 2 gm sodium  Supplements/Tube Feeding: none  IVF:    Estimated Nutritional Needs:   Kcal: 1900-2100  Protein: 80-95 gm  Fluid:  1.9 - 2.1 L   RD was consulted for diet education as pt was eating high salt and increased fluids. Per pt's daughter, pt was previously on a puree diet and was eating very little. Was told by wound care MD to let the pt eat what she wanted to increase her kcal and protein intake. Pt was advanced to a regular diet about 3 weeks ago. Pt still will not eat food unless it is seasoned, per daughter she will spit it out if if she doesn't like it. Daughter reports pt drinks  Glucerna for lunch daily, also will have another Glucerna if refuses a meal. Pt previously had lost some weight, s/p colostomy in late 06/2011. Weight loss of 22  lbs in 2 months, 14.8 % weight loss, severe. Pt is near her UBW but this is likely influenced by fluid. Pt noted to have generalized edema per RN flowsheets. Pt also with unstageable PU on foot, has wound vac.   Pt meets criteria for moderate malnutrition in the context of acute illness or injury 2/2 to weight loss of 14.8% in about 2 months, fluid accumulation, and likely inadequate intake from being on a puree diet.   NUTRITION DIAGNOSIS: -Inadequate oral intake (NI-2.1).  Status: Ongoing  RELATED TO: previous puree diet, dislike of salt restriction, increased needs  AS EVIDENCE BY: weight loss of 14.8% in about 2 months  MONITORING/EVALUATION(Goals): Goal: PO intake of meals and supplements will meet >90% of estimated needs to assist in wound healing.  Monitor: PO intake, weight, labs, wound healing, I/O's  EDUCATION NEEDS: -Education needs addressed -RD feels that sodium restriction is not appropriate in this pt. Pt has a hx of refusing or spitting out foods she dislikes. Pt has increase needs r/t healing and recent weight loss, a sodium restriction will likely exacerbate this. RD recommended allowing the pt to eat foods she will accept to promote healing and weight maintenance.   INTERVENTION: 1. RD add Glucerna BID between meals 2. Add multivitamin supplement 3. RD will continue to follow  Dietitian [phone]  DOCUMENTATION CODES Per approved criteria  -Non-severe (moderate) malnutrition in the context of acute illness or injury    [name] 09/23/2011, 12:21 PM

## 2011-09-23 NOTE — Consult Note (Signed)
WOC consult Note Reason for Consult: open abdominal surgical wound.  Pt at home with Katrinka Blazing and Nephew NPWT, requested to eval and place pt on hospital NPWT unit.  Has had HHRN for management of wound NPWT dressings at home and to assist with ostomy care.   Wound type: open surgical wound Measurement: 8cm x 2cm x 3.5cm  Wound bed: 95% granulation tissue, with some minimal skin necrosis at the proximal edges from 10-12 o'clock.  Drainage (amount, consistency, odor) minimal to none in canister Periwound:intact without problems Dressing procedure/placement/frequency: replaced NPWT dressing, 1pc black granufoam placed in wound bed, draped and seal obtained at without difficulty.  WOC ostomy consult  Stoma type/location: LLQ, budded colostomy Stomal assessment/size: 15/8" round, pink, moist output Peristomal assessment: intact with no problems Output pasty brown stool in pouch Ostomy pouching: 1pc. Karaya 2" round with clamp closure is what this pt is using from home and working well. Replaced this pouch today with same and ordered 2 pouches to bedside for nursing.     WOC will assist as needed with ostomy and wound management. Delorean Knutzen [place], [place] [phone]

## 2011-09-24 LAB — BASIC METABOLIC PANEL
BUN: 45 mg/dL — ABNORMAL HIGH (ref 6–23)
CO2: 23 mEq/L (ref 19–32)
Chloride: 97 mEq/L (ref 96–112)
GFR calc non Af Amer: 40 mL/min — ABNORMAL LOW (ref >90)
Glucose, Bld: 78 mg/dL (ref 70–99)
Potassium: 4.4 mEq/L (ref 3.5–5.1)
Sodium: 130 mEq/L — ABNORMAL LOW (ref 135–145)

## 2011-09-24 LAB — GLUCOSE, CAPILLARY
Glucose-Capillary: 97 mg/dL (ref 70–99)
Glucose-Capillary: 72 mg/dL (ref 70–99)
Glucose-Capillary: 76 mg/dL (ref 70–99)
Glucose-Capillary: 150 mg/dL — ABNORMAL HIGH (ref 70–99)
Glucose-Capillary: 199 mg/dL — ABNORMAL HIGH (ref 70–99)

## 2011-09-24 MED ORDER — FUROSEMIDE 40 MG PO TABS
40.0000 mg | ORAL_TABLET | Freq: Two times a day (BID) | ORAL | Status: DC
  Administered 2011-09-24 – 2011-09-25 (×2): 40 mg via ORAL
  Filled 2011-09-24 (×4): qty 1

## 2011-09-24 MED ORDER — GLUCOSE 40 % PO GEL
ORAL | Status: AC
  Administered 2011-09-24: 1
  Filled 2011-09-24: qty 1

## 2011-09-24 MED ORDER — INSULIN DETEMIR 100 UNIT/ML ~~LOC~~ SOLN
10.0000 [IU] | Freq: Every day | SUBCUTANEOUS | Status: DC
  Administered 2011-09-24: 10 [IU] via SUBCUTANEOUS

## 2011-09-24 NOTE — Progress Notes (Signed)
0830 Blood sugar low at 75.  Daughter called out and said that patient was unresponsive.  Found client clammy and difficult to arouse.  Glucose gel given per protocol.  Recheck of blood sugar in 15 minutes was only 76.  Client started to awaken and was feed by daughter.  Recheck of blood sugar results  132 at this time.  Awake and alert.  Will continue to monitor.  Thanks,  [employer]

## 2011-09-24 NOTE — Progress Notes (Signed)
09/24/2011 [facility], Bosie Clos [facility] Case Management Note [phone]    CARE MANAGEMENT NOTE 09/24/2011  Patient:  [name], [name]   Account Number:  [id number]  Date Initiated:  09/24/2011  Documentation initiated by:  Fransico Michael  Subjective/Objective Assessment:   admitted on 09/21/11 with c/o shortness of breath     Action/Plan:   prior to admission, patient lived at home with daughters   Anticipated DC Date:  09/26/2011   Anticipated DC Plan:  HOME W HOME HEALTH SERVICES      DC Planning Services  CM consult      Choice offered to / List presented to:             [facility] agency  [facility]   Status of service:  In process, will continue to follow Medicare Important Message given?   (If response is "NO", the following Medicare IM given date fields will be blank) Date Medicare IM given:   Date Additional Medicare IM given:    Discharge Disposition:    Per UR Regulation:  Reviewed for med. necessity/level of care/duration of stay  If discussed at Long Length of Stay Meetings, dates discussed:    Comments:  09/24/11-1303-J.Geraldene Eisel,RN,BSN [phone]      78yo admitted on 09/21/11 with c/o shortness of breath. Prior to admission, patient lived at home with daughters. Patient has abdominal wound vac and colostomy. In to speak with daughter and patient. Reports being active with [facility]. Hilda Lias, RN with [facility] notified that patient is in hospital.

## 2011-09-24 NOTE — Progress Notes (Signed)
Subjective:  Sleepy. Had low blood sugar this AM. Daughter against ASA, Plavix or coumadin use for AV possible thrombus.  Objective:  Vital Signs in the last 24 hours: Temp:  [97.3 F (36.3 C)-97.9 F (36.6 C)] 97.9 F (36.6 C) (05/09 1300) Pulse Rate:  [68-120] 68  (05/09 1300) Cardiac Rhythm:  [-] Normal sinus rhythm (05/09 0700) Resp:  [18-20] 18  (05/09 1300) BP: (105-135)/(56-90) 105/90 mmHg (05/09 1300) SpO2:  [93 %-100 %] 98 % (05/09 1300) Weight:  [62.9 kg (138 lb 10.7 oz)] 62.9 kg (138 lb 10.7 oz) (05/09 0534)  Physical Exam: BP Readings from Last 1 Encounters:  09/24/11 105/90    Wt Readings from Last 1 Encounters:  09/24/11 62.9 kg (138 lb 10.7 oz)    Weight change: -1.1 kg (-2 lb 6.8 oz)  HEENT: [place]/AT, Eyes-Brown, PERL, EOMI, Conjunctiva-Pale pink, Sclera-Non-icteric Neck: No JVD, No bruit, Trachea midline. Lungs:  Clear, Bilateral. Cardiac:  Regular rhythm, normal S1 and S2, no S3.  Abdomen:  Soft, non-tender. Extremities:  No edema present. No cyanosis. No clubbing. CNS: AxOx3, Cranial nerves grossly intact, moves all 4 extremities. Right handed. Skin: Warm and dry.   Intake/Output from previous day: 05/08 0701 - 05/09 0700 In: 488 [P.O.:480; IV Piggyback:8] Out: 1900 [Urine:1900]    Lab Results: BMET    Component Value Date/Time   NA 130* 09/24/2011 0635   K 4.4 09/24/2011 0635   CL 97 09/24/2011 0635   CO2 23 09/24/2011 0635   GLUCOSE 78 09/24/2011 0635   BUN 45* 09/24/2011 0635   CREATININE 1.21* 09/24/2011 0635   CALCIUM 8.4 09/24/2011 0635   GFRNONAA 40* 09/24/2011 0635   GFRAA 47* 09/24/2011 0635   CBC    Component Value Date/Time   WBC 8.9 09/21/2011 1756   WBC 9.1 08/23/2009 0819   RBC 4.08 09/21/2011 1756   RBC 4.42 08/23/2009 0819   HGB 10.5* 09/21/2011 1756   HGB 12.6 08/23/2009 0819   HCT 33.9* 09/21/2011 1756   HCT 38.4 08/23/2009 0819   PLT 263 09/21/2011 1756   PLT 243 08/23/2009 0819   MCV 83.1 09/21/2011 1756   MCV 86.8 08/23/2009 0819   MCH 25.7* 09/21/2011 1756    MCH 28.4 08/23/2009 0819   MCHC 31.0 09/21/2011 1756   MCHC 32.7 08/23/2009 0819   RDW 20.2* 09/21/2011 1756   RDW 16.1* 08/23/2009 0819   LYMPHSABS 1.5 09/21/2011 1756   LYMPHSABS 1.4 08/23/2009 0819   MONOABS 0.7 09/21/2011 1756   MONOABS 0.7 08/23/2009 0819   EOSABS 0.1 09/21/2011 1756   EOSABS 0.3 08/23/2009 0819   BASOSABS 0.1 09/21/2011 1756   BASOSABS 0.0 08/23/2009 0819   CARDIAC ENZYMES Lab Results  Component Value Date   CKTOTAL 48 09/22/2011   CKMB 3.8 09/22/2011   TROPONINI <0.30 09/22/2011    Assessment/Plan:  Patient Active Hospital Problem List:  Acute combined systolic and diastolic congestive heart failure (06/25/2011) Assessment: Improving Plan: Continue diuresis Pulmonary edema (06/25/2011) Assessment: As above Diabetes mellitus (06/20/2011) Assessment: Decrease Levemir Insulin dose by 33 %. Hyponatremia (06/25/2011) Assessment: Improving Plan: Continue diuresis Hypertension (06/20/2011) Assessment: Stable AV thrombus/Vegetation   Family currently against Aspirin, Plavix, heparin and coumadin use due to GI bleed issues and dislike of coumadin use and h/o recurrent thrombocytopenia.   Encourage comfort care.   Prognosis poor.  Increase activity   LOS: 3 days    Sara Cobb  MD  09/24/2011, 3:00 PM

## 2011-09-25 ENCOUNTER — Encounter (INDEPENDENT_AMBULATORY_CARE_PROVIDER_SITE_OTHER): Payer: PRIVATE HEALTH INSURANCE | Admitting: Surgery

## 2011-09-25 LAB — BASIC METABOLIC PANEL
Calcium: 8.7 mg/dL (ref 8.4–10.5)
GFR calc non Af Amer: 40 mL/min — ABNORMAL LOW (ref >90)
Sodium: 130 mEq/L — ABNORMAL LOW (ref 135–145)

## 2011-09-25 LAB — GLUCOSE, CAPILLARY

## 2011-09-25 LAB — CBC
MCH: 25.5 pg — ABNORMAL LOW (ref 26.0–34.0)
MCHC: 30 g/dL (ref 30.0–36.0)
Platelets: 222 10*3/uL (ref 150–400)

## 2011-09-25 NOTE — Discharge Instructions (Signed)
Heart Failure Heart failure (HF) is a condition in which the heart has trouble pumping blood. This means your heart does not pump blood efficiently for your body to work well. In some cases of HF, fluid may back up into your lungs or you may have swelling (edema) in your lower legs. HF is a long-term (chronic) condition. It is important for you to take good care of yourself and follow your caregiver's treatment plan. CAUSES   Health conditions:   High blood pressure (hypertension) causes the heart muscle to work harder than normal. When pressure in the blood vessels is high, the heart needs to pump (contract) with more force in order to circulate blood throughout the body. High blood pressure eventually causes the heart to become stiff and weak.   Coronary artery disease (CAD) is the buildup of cholesterol and fat (plaques) in the arteries of the heart. The blockage in the arteries deprives the heart muscle of oxygen and blood. This can cause chest pain and may lead to a heart attack. High blood pressure can also contribute to CAD.   Heart attack (myocardial infarction) occurs when 1 or more arteries in the heart become blocked. The loss of oxygen damages the muscle tissue of the heart. When this happens, part of the heart muscle dies. The injured tissue does not contract as well and weakens the heart's ability to pump blood.   Abnormal heart valves can cause HF when the heart valves do not open and close properly. This makes the heart muscle pump harder to keep the blood flowing.   Heart muscle disease (cardiomyopathy or myocarditis) is damage to the heart muscle from a variety of causes. These can include drug or alcohol abuse, infections, or unknown reasons. These can increase the risk of HF.   Lung disease makes the heart work harder because the lungs do not work properly. This can cause a strain on the heart leading it to fail.   Diabetes increases the risk of HF. High blood sugar contributes  to high fat (lipid) levels in the blood. Diabetes can also cause slow damage to tiny blood vessels that carry important nutrients to the heart muscle. When the heart does not get enough oxygen and food, it can cause the heart to become weak and stiff. This leads to a heart that does not contract efficiently.   Other diseases can contribute to HF. These include abnormal heart rhythms, thyroid problems, and low blood counts (anemia).   Unhealthy lifestyle habits:   Obesity.   Smoking.   Eating foods high in fat and cholesterol.   Eating or drinking beverages high in salt.   Drug or alcohol abuse.   Lack of exercise.  SYMPTOMS  HF symptoms may vary and can be hard to detect. Symptoms may include:  Shortness of breath with activity, such as climbing stairs.   Persistent cough.   Swelling of the feet, ankles, legs, or abdomen.   Unexplained weight gain.   Difficulty breathing when lying flat.   Waking from sleep because of the need to sit up and get more air.   Rapid heartbeat.   Fatigue and loss of energy.   Feeling lightheaded or close to fainting.  DIAGNOSIS  A diagnosis of HF is based on your history, symptoms, physical examination, and diagnostic tests. Diagnostic tests for HF may include:  EKG.   Chest X-ray.   Blood tests.   Exercise stress test.   Blood oxygen test (arterial blood gas).   Evaluation   by a heart doctor (cardiologist).   Ultrasound evaluation of the heart (echocardiogram).   Heart artery test to look for blockages (angiogram).   Radioactive imaging to look at the heart (radionuclide test).  TREATMENT  Treatment is aimed at managing the symptoms of HF. Medicines, lifestyle changes, or surgical intervention may be necessary to treat HF.  Medicines to help treat HF may include:   Angiotensin-converting enzyme (ACE) inhibitors. These block the effects of a blood protein called angiotensin-converting enzyme. ACE inhibitors relax (dilate) the  blood vessels and help lower blood pressure. This decreases the workload of the heart, slows the progression of HF, and improves symptoms.   Angiotensin receptor blockers (ARBs). These medications work similar to ACE inhibitors. ARBs may be an alternative for people who cannot tolerate an ACE inhibitor.   Aldosterone antagonists. This medication helps get rid of extra fluid from your body. This lowers the volume of blood the heart has to pump.   Water pills (diuretics). Diuretics cause the kidneys to remove salt and water from the blood. The extra fluid is removed by urination. By removing extra fluid from the body, diuretics help lower the workload of the heart and help prevent fluid buildup in the lungs so breathing is easier.   Beta blockers. These prevent the heart from beating too fast and improve heart muscle strength. Beta blockers help maintain a normal heart rate, control blood pressure, and improve HF symptoms.   Digitalis. This increases the force of the heartbeat and may be helpful to people with HF or heart rhythm problems.   Healthy lifestyle changes include:   Stopping smoking.   Eating a healthy diet. Avoid foods high in fat. Avoid foods fried in oil or made with fat. A dietician can help with healthy food choices.   Limiting how much salt you eat.   Limiting alcohol intake to no more than 1 drink per day for women and 2 drinks per day for men. Drinking more than that is harmful to your heart. If your heart has already been damaged by alcohol or you have severe HF, drinking alcohol should be stopped completely.   Exercising as directed by your caregiver.   Surgical treatment for HF may include:   Procedures to open blocked arteries, repair damaged heart valves, or remove damaged heart muscle tissue.   A pacemaker to help heart muscle function and to control certain abnormal heart rhythms.   A defibrillator to possibly prevent sudden cardiac death.  HOME CARE  INSTRUCTIONS   Activity level. Your caregiver can help you determine what type of exercise program may be helpful. It is important to maintain your strength. Pace your physical activity to avoid shortness of breath or chest pain. Rest for 1 hour before and after meals. A cardiac rehabilitation program may be helpful to some people with HF.   Diet. Eat a heart healthy diet. Food choices should be low in saturated fat and cholesterol. Talk to a dietician to learn about heart healthy foods.   Salt intake. When you have HF, you need to limit the amount of salt you eat. Eat less than 1500 milligrams (mg) of salt per day or as recommended by your caregiver.   Weight monitoring. Weigh yourself every day. You should weigh yourself in the morning after you urinate and before you eat breakfast. Wear the same amount of clothing each time you weigh yourself. Record your weight daily. Bring your recorded weights to your clinic visits. Tell your caregiver right away if   you have gained 3 lb/1.4 kg in 1 day, or 5 lb/2.3 kg in a week or whatever amount you were told to report.   Blood pressure monitoring. This should be done as directed by your caregiver. A home blood pressure cuff can be purchased at a drugstore. Record your blood pressure numbers and bring them to your clinic visits. Tell your caregiver if you become dizzy or lightheaded upon standing up.   Smoking. If you are currently a smoker, it is time to quit. Nicotine makes your heart work harder by causing your blood vessels to constrict. Do not use nicotine gum or patches before talking to your caregiver.   Follow up. Be sure to schedule a follow-up visit with your caregiver. Keep all your appointments.  SEEK MEDICAL CARE IF:   Your weight increases by 3 lb/1.4 kg in 1 day or 5 lb/2.3 kg in a week.   You notice increasing shortness of breath that is unusual for you. This may happen during rest, sleep, or with activity.   You cough more than normal,  especially with physical activity.   You notice more swelling in your hands, feet, ankles, or belly (abdomen).   You are unable to sleep because it is hard to breathe.   You cough up bloody mucus (sputum).   You begin to feel "jumping" or "fluttering" sensations (palpitations) in your chest.  SEEK IMMEDIATE MEDICAL CARE IF:   You have severe chest pain or pressure which may include symptoms such as:   Pain or pressure in the arms, neck, jaw, or back.   Feeling sweaty.   Feeling sick to your stomach (nauseous).   Feeling short of breath while at rest.   Having a fast or irregular heartbeat.   You experience stroke symptoms. These symptoms include:   Facial weakness or numbness.   Weakness or numbness in an arm, leg, or on one side of your body.   Blurred vision.   Difficulty talking or thinking.   Dizziness or fainting.   Severe headache.  THESE ARE MEDICAL EMERGENCIES. Do not wait to see if the symptoms go away. Call your local emergency services (911 in [place] DO NOT drive yourself to the hospital. IMPORTANT  Make a list of every medicine, vitamin, or herbal supplement you are taking. Keep the list with you at all times. Show it to your caregiver at every visit. Keep the list up-to-date.   Ask your caregiver or pharmacist to write an explanation of each medicine you are taking. This should include:   Why you are taking it.   The possible side effects.   The best time of day to take it.   Foods to take with it or what foods to avoid.   When to stop taking it.  MAKE SURE YOU:   Understand these instructions.   Will watch your condition.   Will get help right away if you are not doing well or get worse.  Document Released: 05/04/2005 Document Revised: 04/23/2011 Document Reviewed: 08/16/2009 [employer] Patient Information 2012 [employer]. 

## 2011-09-26 ENCOUNTER — Emergency Department (HOSPITAL_COMMUNITY)
Admission: EM | Admit: 2011-09-26 | Discharge: 2011-09-26 | Disposition: A | Discharge status: Home / Self Care | Payer: PRIVATE HEALTH INSURANCE | Attending: Emergency Medicine | Admitting: Emergency Medicine

## 2011-09-26 ENCOUNTER — Encounter (HOSPITAL_COMMUNITY): Payer: Self-pay | Admitting: *Deleted

## 2011-09-26 DIAGNOSIS — E119 Type 2 diabetes mellitus without complications: Secondary | ICD-10-CM | POA: Insufficient documentation

## 2011-09-26 DIAGNOSIS — Z933 Colostomy status: Secondary | ICD-10-CM | POA: Insufficient documentation

## 2011-09-26 DIAGNOSIS — M549 Dorsalgia, unspecified: Secondary | ICD-10-CM | POA: Insufficient documentation

## 2011-09-26 DIAGNOSIS — J449 Chronic obstructive pulmonary disease, unspecified: Secondary | ICD-10-CM | POA: Insufficient documentation

## 2011-09-26 DIAGNOSIS — R609 Edema, unspecified: Secondary | ICD-10-CM | POA: Insufficient documentation

## 2011-09-26 DIAGNOSIS — Z8673 Personal history of transient ischemic attack (TIA), and cerebral infarction without residual deficits: Secondary | ICD-10-CM | POA: Insufficient documentation

## 2011-09-26 DIAGNOSIS — Z794 Long term (current) use of insulin: Secondary | ICD-10-CM | POA: Insufficient documentation

## 2011-09-26 DIAGNOSIS — Z79899 Other long term (current) drug therapy: Secondary | ICD-10-CM | POA: Insufficient documentation

## 2011-09-26 DIAGNOSIS — I10 Essential (primary) hypertension: Secondary | ICD-10-CM | POA: Insufficient documentation

## 2011-09-26 DIAGNOSIS — N390 Urinary tract infection, site not specified: Secondary | ICD-10-CM

## 2011-09-26 DIAGNOSIS — R339 Retention of urine, unspecified: Secondary | ICD-10-CM | POA: Insufficient documentation

## 2011-09-26 DIAGNOSIS — J4489 Other specified chronic obstructive pulmonary disease: Secondary | ICD-10-CM | POA: Insufficient documentation

## 2011-09-26 DIAGNOSIS — I252 Old myocardial infarction: Secondary | ICD-10-CM | POA: Insufficient documentation

## 2011-09-26 LAB — URINALYSIS, ROUTINE W REFLEX MICROSCOPIC
Bilirubin Urine: NEGATIVE
Hgb urine dipstick: NEGATIVE
Protein, ur: NEGATIVE mg/dL
Specific Gravity, Urine: 1.022 (ref 1.005–1.030)
Urobilinogen, UA: 0.2 mg/dL (ref 0.0–1.0)
pH: 5.5 (ref 5.0–8.0)

## 2011-09-26 LAB — GLUCOSE, CAPILLARY

## 2011-09-26 LAB — URINE MICROSCOPIC-ADD ON

## 2011-09-26 MED ORDER — HYDROCODONE-ACETAMINOPHEN 5-325 MG PO TABS
1.0000 | ORAL_TABLET | Freq: Once | ORAL | Status: AC
  Administered 2011-09-26: 1 via ORAL
  Filled 2011-09-26: qty 1

## 2011-09-26 MED ORDER — HYDROCODONE-ACETAMINOPHEN 5-500 MG PO TABS: 1.0000 | ORAL_TABLET | Freq: Four times a day (QID) | ORAL | Status: AC | PRN

## 2011-09-26 MED ORDER — CEPHALEXIN 500 MG PO CAPS: 500.0000 mg | ORAL_CAPSULE | Freq: Four times a day (QID) | ORAL | Status: DC

## 2011-09-26 MED ORDER — DEXTROSE 5 % IV SOLN
1.0000 g | INTRAVENOUS | Status: DC
  Administered 2011-09-26: 1 g via INTRAVENOUS
  Filled 2011-09-26: qty 10

## 2011-09-26 NOTE — Discharge Instructions (Signed)
Empty leg bag as need. Drink plenty of fluids.  You may take vicodin as need for pain.   Do not take tylenol or acetaminophen containing medication when taking vicodin.  Vicodin may causes drowsiness, no driving when taking.   Take antibiotic as prescribed for urine infection -  A urine culture was sent the results of which will be back Monday - have your doctor follow up on those results Monday. Follow up with primary care doctor Monday for recheck - discuss with them removing the foley catheter then.  If recurrent problems with urine retention, discuss referral to urologist.  Return to ER if worse, fevers, weakness/faintness, vomiting, chills/sweats, other concern.      Urinary Tract Infection Infections of the urinary tract can start in several places. A bladder infection (cystitis), a kidney infection (pyelonephritis), and a prostate infection (prostatitis) are different types of urinary tract infections (UTIs). They usually get better if treated with medicines (antibiotics) that kill germs. Take all the medicine until it is gone. You or your child may feel better in a few days, but TAKE ALL MEDICINE or the infection may not respond and may become more difficult to treat. HOME CARE INSTRUCTIONS   Drink enough water and fluids to keep the urine clear or pale yellow. Cranberry juice is especially recommended, in addition to large amounts of water.   Avoid caffeine, tea, and carbonated beverages. They tend to irritate the bladder.   Alcohol may irritate the prostate.   Only take over-the-counter or prescription medicines for pain, discomfort, or fever as directed by your caregiver.  To prevent further infections:  Empty the bladder often. Avoid holding urine for long periods of time.   After a bowel movement, women should cleanse from front to back. Use each tissue only once.   Empty the bladder before and after sexual intercourse.  FINDING OUT THE RESULTS OF YOUR TEST Not all test  results are available during your visit. If your or your child's test results are not back during the visit, make an appointment with your caregiver to find out the results. Do not assume everything is normal if you have not heard from your caregiver or the medical facility. It is important for you to follow up on all test results. SEEK MEDICAL CARE IF:   There is back pain.   Your baby is older than 3 months with a rectal temperature of 100.5 F (38.1 C) or higher for more than 1 day.   Your or your child's problems (symptoms) are no better in 3 days. Return sooner if you or your child is getting worse.  SEEK IMMEDIATE MEDICAL CARE IF:   There is severe back pain or lower abdominal pain.   You or your child develops chills.   You have a fever.   Your baby is older than 3 months with a rectal temperature of 102 F (38.9 C) or higher.   Your baby is 70 months old or younger with a rectal temperature of 100.4 F (38 C) or higher.   There is nausea or vomiting.   There is continued burning or discomfort with urination.  MAKE SURE YOU:   Understand these instructions.   Will watch your condition.   Will get help right away if you are not doing well or get worse.  Document Released: 02/11/2005 Document Revised: 04/23/2011 Document Reviewed: 09/16/2006 [facility] Patient Information 2012 [place], [place].      Foley Catheter Care, Adult A soft, flexible tube (Foley catheter) has  been placed in your bladder. This may be done to temporarily help with urine drainage after an operation or to relieve blockage from an enlarged prostate gland. HOME CARE INSTRUCTIONS  If you are going home with a Foley catheter in place, follow these instructions: Taking Care of the Catheter:  Keep the area where the catheter leaves your body clean.   Attach the catheter to the leg so there is no tension on the catheter.   Keep the drainage bag below the level of the bladder, but keep it OFF the  floor.   Do not take long soaking baths. Your caregiver will give instructions about showering.   Wash your hands before touching ANYTHING related to the catheter or bag.   Using mild soap and warm water on a washcloth:   Clean the area closest to the catheter insertion site using a circular motion around the catheter.   Clean the catheter itself by wiping AWAY from the insertion site for several inches down the tube.   NEVER wipe upward as this could sweep bacteria up into the urethra (tube in your body that normally drains the bladder) and cause infection.  Taking Care of the Drainage Bags:  Two drainage bags will be taken home: a large overnight drainage bag, and a smaller leg bag which fits underneath clothing.   It is okay to wear the overnight bag at any time, but NEVER wear the smaller leg bag at night.   Keep the drainage bag well below the level of your bladder. This prevents backflow of urine into the bladder and allows the urine to drain freely.   Anchor the tubing to your leg to prevent pulling or tension on the catheter. Use tape or a leg strap provided by the hospital.   Empty the drainage bag when it is  to  full. Wash your hands before and after touching the bag.   Periodically check the tubing for kinks to make sure there is no pressure on the tubing which could restrict the flow of urine.  Changing the Drainage Bags:  Cleanse both ends of the clean bag with alcohol before changing.   Pinch off the rubber catheter to avoid urine spillage during the disconnection.   Disconnect the dirty bag and connect the clean one.   Empty the dirty bag carefully to avoid a urine spill.   Attach the new bag to the leg with tape or a leg strap.  Cleaning the Drainage Bags:  Whenever a drainage bag is disconnected, it must be cleaned quickly so it is ready for the next use.   Wash the bag in warm, soapy water.   Rinse the bag thoroughly with warm water.   Soak the bag  for 30 minutes in a solution of white vinegar and water (1 cup vinegar to 1 quart warm water).   Rinse with warm water.  SEEK MEDICAL CARE IF:   Some pain develops in the kidney (lower back) area.   The urine is cloudy or smells bad.   There is some blood in the urine.   The catheter becomes clogged and/or there is no urine drainage.  SEEK IMMEDIATE MEDICAL CARE IF:   You have moderate or severe pain in the kidney region.   You start to throw up (vomit).   Blood fills the tube.   Worsening belly (abdominal) pain develops.   You have a fever.  MAKE SURE YOU:   Understand these instructions.   Will watch your condition.  Will get help right away if you are not doing well or get worse.  Document Released: 05/04/2005 Document Revised: 04/23/2011 Document Reviewed: 10/29/2006 [facility] Patient Information 2012 [place], [place].      Acute Urinary Retention, Female You have been seen by a caregiver today because of your inability to urinate (pass your water). This is an uncommon problem in females. CAUSES  It can be caused by:  Infection.   A side effect of a medication.   A problem in a nearby organ that presses or squeezes on the bladder or the urethra (the tube that drains the bladder).   Psychological problems.  TREATMENT  Treatment may involve a one-time placement of a tube in the bladder to empty it. It may be a problem that does not recur for years. You and your caregiver can decide how to handle this problem in follow-up. You may need to be referred to a specialist for additional examination and tests.  HOME CARE INSTRUCTIONS  If you are to leave the foley catheter (a long, narrow, hollow tube) in and go home with a drainage system, you will need to discuss the best course of action with your caregiver. While the catheter is in, maintain a good intake of fluids. Keep the drainage bag emptied and lower than your catheter. This is so contaminated (infected) urine  will not flow back into your bladder. This could lead to a urinary tract infection. Only take over-the-counter or prescription medicines for pain, discomfort, or fever as directed by your caregiver.  SEEK IMMEDIATE MEDICAL CARE IF:  You develop chills, fever, or show signs of generalized illness that occurs prior to seeing your caregiver. Document Released: 05/03/2006 Document Revised: 04/23/2011 Document Reviewed: 04/05/2009 [facility] Patient Information 2012 [place], [place].

## 2011-09-26 NOTE — ED Provider Notes (Addendum)
History     CSN: [id number]  Arrival date & time 09/26/11  1005   First MD Initiated Contact with Patient 09/26/11 1012      Chief Complaint  Patient presents with  . Urinary Retention    (Consider location/radiation/quality/duration/timing/severity/associated sxs/prior treatment) The history is provided by the patient and a relative.  pt with recent admission for volume overload, was placed on lasix in hospital, symptoms improved. Since hospital discharge yesterday no urine output. Pt denies any c/o. Denies abd pain. No nvd. Is eating/drinking. Did have foley catheter in place in hospital until yesterday. No fever or chills. Denies same symptoms previously. Denies exacerbating or alleviating factors.   Past Medical History  Diagnosis Date  . Diabetes mellitus   . Hypertension   . Vascular disease   . Shortness of breath   . Bladder infection   . COPD (chronic obstructive pulmonary disease)   . Myocardial infarction   . Stroke   . GERD (gastroesophageal reflux disease)   . Diverticulitis     Past Surgical History  Procedure Date  . Bladder tack   . Femoral artery stent   . Abdominal hysterectomy   . Esophagogastroduodenoscopy 07/12/2011    Procedure: ESOPHAGOGASTRODUODENOSCOPY (EGD);  Surgeon: Theda Belfast, MD;  Location: [facility];  Service: Endoscopy;  Laterality: N/A;  bedside   . Flexible sigmoidoscopy 07/12/2011    Procedure: FLEXIBLE SIGMOIDOSCOPY;  Surgeon: Theda Belfast, MD;  Location: [facility];  Service: Endoscopy;  Laterality: N/A;  . Laparotomy 07/12/2011    Procedure: EXPLORATORY LAPAROTOMY;  Surgeon: Kandis Cocking, MD;  Location: [facility];  Service: General;  Laterality: N/A;  exploratory laparotomy, left sigmoid colectomy, creation of colostomy  . Abdominal surgery   . Colostomy   . Eye surgery     No family history on file.  History  Substance Use Topics  . Smoking status: Never Smoker   . Smokeless tobacco: Never Used  . Alcohol Use: No     OB History    Grav Para Term Preterm Abortions TAB SAB Ect Mult Living                  Review of Systems  Constitutional: Negative for fever and chills.  HENT: Negative for neck pain.   Eyes: Negative for pain.  Respiratory: Negative for shortness of breath.   Cardiovascular: Negative for chest pain.  Gastrointestinal: Negative for abdominal pain.  Genitourinary: Negative for flank pain.  Musculoskeletal: Negative for back pain.  Skin: Negative for rash.  Neurological: Negative for weakness, numbness and headaches.  Hematological: Does not bruise/bleed easily.  Psychiatric/Behavioral: Negative for confusion.    Allergies  Avelox; Proton pump inhibitors; and Warfarin sodium  Home Medications   Current Outpatient Rx  Name Route Sig Dispense Refill  . ACETAMINOPHEN 500 MG PO TABS Oral Take 1,000 mg by mouth every 6 (six) hours as needed. For pain    . ALBUTEROL SULFATE (5 MG/ML) 0.5% IN NEBU Nebulization Take 0.5 mLs (2.5 mg total) by nebulization 3 (three) times daily. 20 mL 0  . IPRATROPIUM-ALBUTEROL 18-103 MCG/ACT IN AERO Inhalation Inhale 2 puffs into the lungs every 6 (six) hours as needed. For shortness of breath    . ALPRAZOLAM 0.25 MG PO TABS Oral Take 0.25 mg by mouth at bedtime.    Marland Kitchen VITAMIN C 100 MG PO TABS Oral Take 100 mg by mouth daily.    . ATORVASTATIN CALCIUM 20 MG PO TABS Oral Take 20 mg by mouth at  bedtime.    . CEPHALEXIN 500 MG PO CAPS Oral Take 500 mg by mouth 2 (two) times daily. For 7 days; Start date unknown    . DIGOXIN 0.125 MG PO TABS Oral Take 125 mcg by mouth every other day.    Marland Kitchen FAMOTIDINE 20 MG PO TABS Oral Take 20 mg by mouth at bedtime.    . FUROSEMIDE 40 MG PO TABS Oral Take 1 tablet (40 mg total) by mouth daily. 30 tablet 0  . INSULIN DETEMIR 100 UNIT/ML [place] SOLN Subcutaneous Inject 0-15 Units into the skin See admin instructions. Patient on sliding scale in the morning and takes 15 units at bedtime    . IPRATROPIUM BROMIDE 0.02 % IN SOLN  Nebulization Take 2.5 mLs (0.5 mg total) by nebulization 3 (three) times daily. 75 mL 0  . GERITOL COMPLETE PO Oral Take 2 tablets by mouth daily.    Marland Kitchen METOPROLOL TARTRATE 25 MG PO TABS Oral Take 12.5 mg by mouth 2 (two) times daily.    . MOMETASONE FUROATE 50 MCG/ACT NA SUSP Nasal Place 2 sprays into the nose at bedtime as needed. For congestion    . ICAPS PO CAPS Oral Take 1 capsule by mouth 2 (two) times daily.    . OMEGA-3-ACID ETHYL ESTERS 1 G PO CAPS Oral Take 1 g by mouth daily.     Marland Kitchen SALINE NASAL SPRAY 0.65 % NA SOLN Nasal Place 1 spray into the nose at bedtime as needed. For congestion    . POTASSIUM CHLORIDE ER 10 MEQ PO TBCR Oral Take 10 mEq by mouth daily.      BP 147/69  Pulse 78  Temp(Src) 97.3 F (36.3 C) (Oral)  Resp 16  SpO2 96%  Physical Exam  Nursing note and vitals reviewed. Constitutional: She appears well-developed and well-nourished. No distress.  Eyes: Conjunctivae are normal. No scleral icterus.  Neck: Neck supple. No tracheal deviation present.  Cardiovascular: Normal rate, normal heart sounds and intact distal pulses.   Pulmonary/Chest: Effort normal and breath sounds normal. No respiratory distress.  Abdominal: Soft. Normal appearance. She exhibits no distension and no mass. There is no tenderness. There is no rebound and no guarding.       Ostomy left abd pink patent functioning, brown stool in bag. Wound vac applied to small open midline wound. No abd wall cellulitis.   Genitourinary:       No cva tenderness  Musculoskeletal:       Mild bil ankle edema  Neurological: She is alert.  Skin: Skin is warm and dry. No rash noted.  Psychiatric: She has a normal mood and affect.    ED Course  Procedures (including critical care time)  Results for orders placed during the hospital encounter of 09/26/11  URINALYSIS, ROUTINE W REFLEX MICROSCOPIC      Component Value Range   Color, Urine YELLOW  YELLOW    APPearance TURBID (*) CLEAR    Specific Gravity,  Urine 1.022  1.005 - 1.030    pH 5.5  5.0 - 8.0    Glucose, UA NEGATIVE  NEGATIVE (mg/dL)   Hgb urine dipstick NEGATIVE  NEGATIVE    Bilirubin Urine NEGATIVE  NEGATIVE    Ketones, ur NEGATIVE  NEGATIVE (mg/dL)   Protein, ur NEGATIVE  NEGATIVE (mg/dL)   Urobilinogen, UA 0.2  0.0 - 1.0 (mg/dL)   Nitrite NEGATIVE  NEGATIVE    Leukocytes, UA MODERATE (*) NEGATIVE   URINE MICROSCOPIC-ADD ON      Component  Value Range   Squamous Epithelial / LPF RARE  RARE    WBC, UA TOO NUMEROUS TO COUNT  <3 (WBC/hpf)   Bacteria, UA MANY (*) RARE    Urine-Other RARE YEAST    GLUCOSE, CAPILLARY      Component Value Range   Glucose-Capillary 221 (*) 70 - 99 (mg/dL)   Comment 1 Notify RN     Comment 2 Documented in Chart     Dg Chest 2 View  09/22/2011  *RADIOLOGY REPORT*  Clinical Data: Weakness, shortness of breath  CHEST - 2 VIEW  Comparison: Portable chest x-ray of 09/14/2011  Findings: The lungs are poorly aerated with basilar atelectasis and possible mild pulmonary vascular congestion.  Cardiomegaly is stable.  There are degenerative changes throughout the thoracic spine.  IMPRESSION: Little change in poor aeration with cardiomegaly and probable pulmonary vascular congestion.  Original Report Authenticated By: Juline Patch, M.D.   Dg Chest 2 View  09/13/2011  *RADIOLOGY REPORT*  Clinical Data: Shortness of breath.  CHEST - 2 VIEW  Comparison: 09/13/2011  Findings: Mild interstitial edema present.  No evidence of overt airspace edema.  Stable cardiomegaly.  No significant pleural effusions.  IMPRESSION: Mild interstitial edema.  Stable cardiomegaly.  Original Report Authenticated By: Reola Calkins, M.D.   Dg Chest 2 View  09/13/2011  *RADIOLOGY REPORT*  Clinical Data: Shortness of breath.  CHEST - 2 VIEW  Comparison: 07/14/2011  Findings: Lung volumes are very low with bibasilar atelectasis present, left greater than right.  No edema, pulmonary consolidation or significant pleural fluid.  There is  stable cardiomegaly and stable bulky calcifications within the heart likely at the level of the mitral annulus.  IMPRESSION: Low lung volumes with bibasilar atelectasis.  Original Report Authenticated By: Reola Calkins, M.D.   Dg Thoracic Spine 2 View  09/13/2011  *RADIOLOGY REPORT*  Clinical Data: Upper back pain.  No known injury.  THORACIC SPINE - 2 VIEW  Comparison: Chest radiographs dated 09/13/2011.  Findings: Mild and moderate spur formation throughout the majority of the thoracic spine.  No fractures or subluxations are seen. Mitral valve annulus calcifications are noted.  IMPRESSION: Degenerative changes.  No acute abnormality.  Original Report Authenticated By: Darrol Angel, M.D.   Dg Lumbar Spine 2-3 Views  09/13/2011  *RADIOLOGY REPORT*  Clinical Data: Low back pain.  No known injury.  LUMBAR SPINE - 2-3 VIEW  Comparison: Previous examinations.  Findings: Transitional thoracolumbar vertebra followed by five non- rib bearing lumbar vertebrae.  The last open disc space is labeled the L5-S1 level.  Progressive fusion of the L4 and L5 vertebral bodies since 07/07/2011.  Stable grade 1 anterolisthesis at the L3- 4 level.  Stable mild anterior spur formation at multiple levels of the lumbar and lower thoracic spine.  Facet degenerative changes throughout the lumbar spine.  No fractures seen.  Minimal scoliosis.  Stable inferior vena cava filter.  Atheromatous arterial calcifications.  IMPRESSION: Degenerative changes, as described above.  No acute abnormality.  Original Report Authenticated By: Darrol Angel, M.D.   Nm Pulmonary Perfusion  09/13/2011  *RADIOLOGY REPORT*  Clinical Data: Chest pain.  Shortness of breath.  Chronic kidney disease.  NM PULMONARY PERFUSION PARTICULATE  Radiopharmaceutical: CURIE MAA TECHNETIUM TO 54M ALBUMIN AGGREGATED  Comparison: 06/20/2011.  Chest radiographs obtained earlier today.  Findings: Again demonstrated is normal perfusion of both lungs with no  perfusion defects seen.  IMPRESSION: Normal examination.  No evidence of pulmonary embolism.  Original Report  Authenticated By: Darrol Angel, M.D.   Dg Chest Port 1 View  09/14/2011  *RADIOLOGY REPORT*  Clinical Data: Shortness of breath, weakness  PORTABLE CHEST - 1 VIEW  Comparison: 09/13/2011  Findings: Expiratory frontal radiograph with vascular crowding and bibasilar atelectasis.  No definite pleural effusion or pneumothorax.  Cardiomegaly.  IMPRESSION: Expiratory frontal radiograph with bibasilar atelectasis.  Cardiomegaly.  Original Report Authenticated By: Charline Bills, M.D.      MDM  Foley.  Additional history obtained from family member.  Recent charts and recent hospital admission, including labs, reviewed.   Foley placed, approximately 300 cc yellow, sl cloudy urine out.   Pt denies any c/o. No pain or nv.   Additional 150 cc urine in bag. Pt given meal, eating and drinking.    ua w uti, u cx added. Rocephin iv.   At d/c pt and family member requests rx for pain med for home for chronic back pain, states that otc meds and ultram in past havent worked but that hydrocodone has. Will give rx.      Suzi Roots, MD 09/26/11 1439  Suzi Roots, MD 09/26/11 [phone]

## 2011-09-26 NOTE — ED Notes (Signed)
Pt comes in from home; was just previously discharged yesterday from the hospital with having fluid overload. Pt has not voided since her foley catheter was removed yesterday at 3pm. Pt left leg has also began to start swelling since yesterday.

## 2011-09-27 LAB — URINE CULTURE: Colony Count: NO GROWTH

## 2011-09-28 ENCOUNTER — Encounter (INDEPENDENT_AMBULATORY_CARE_PROVIDER_SITE_OTHER): Payer: Self-pay

## 2011-09-29 NOTE — Discharge Summary (Signed)
Physician Discharge Summary  Patient ID: [name] MRN: [id number] DOB/AGE: 09-29-1925 78 y.o.  Admit date: 09/21/2011 Discharge date: 09/25/2011  Admission Diagnoses: Acute combined systolic and diastolic congestive heart failure  Pulmonary edema  Diabetes mellitus  Thrombocytopenia  Hyponatremia  Hypertension  Discharge Diagnoses:  Principal Problem:  *Acute combined systolic and diastolic congestive heart failure Active Problems:  Pulmonary edema  Diabetes mellitus  Thrombocytopenia  Hyponatremia  Hypertension  Anxiety  Chronic insomnia  Constipation  Discharged Condition: good  Hospital Course: 78 years old black female with known congestive heart failure and multiple medical conditions had lasix dose decreased 1 week ago resulting in bilateral leg edema and shortness of breath. She improved with IV lasix over 4 days of treatment.  Consults: cardiology  Significant Diagnostic Studies: labs: Hyponatremia and hyperkalemia responded well to IV lasix. Na level improved to 130 from 123 meq. K+ was 4.4 to 5.4 meq.  Treatments: cardiac meds: furosemide and amiodarone  Discharge Exam: Blood pressure 133/60, pulse 73, temperature 97.6 F (36.4 C), temperature source Oral, resp. rate 18, height 5\' 3"  (1.6 m), weight 62.596 kg (138 lb), SpO2 100.00%. HEENT: [place]/AT, Eyes-Brown, PERL, EOMI, Conjunctiva-Pale pink, Sclera-Non-icteric  Neck: + JVD, No bruit, Trachea midline.  Lungs: Clear, Bilateral.  Cardiac: Regular rhythm, normal S1 and S2, no S3.  Abdomen: Soft, non-tender.  Extremities: No edema present. No cyanosis. No clubbing.  CNS: AxOx3, Cranial nerves grossly intact, moves all 4 extremities. Right handed.  Skin: Warm and dry.   Disposition: 01-Home or Self Care   Medication List  As of 09/29/2011  9:22 AM   TAKE these medications         albuterol (5 MG/ML) 0.5% nebulizer solution   Commonly known as: PROVENTIL   Take 0.5 mLs (2.5 mg total) by nebulization 3  (three) times daily.      albuterol-ipratropium 18-103 MCG/ACT inhaler   Commonly known as: COMBIVENT   Inhale 2 puffs into the lungs every 6 (six) hours as needed. For shortness of breath      ALPRAZolam 0.25 MG tablet   Commonly known as: XANAX   Take 0.25 mg by mouth at bedtime.      atorvastatin 20 MG tablet   Commonly known as: LIPITOR   Take 20 mg by mouth at bedtime.      cephALEXin 500 MG capsule   Commonly known as: KEFLEX   Take 500 mg by mouth 2 (two) times daily. For 7 days; Start date unknown      digoxin 0.125 MG tablet   Commonly known as: [name]   Take 125 mcg by mouth every other day.      famotidine 20 MG tablet   Commonly known as: PEPCID   Take 20 mg by mouth at bedtime.      furosemide 40 MG tablet   Commonly known as: LASIX   Take 1 tablet (40 mg total) by mouth daily.      GERITOL COMPLETE PO   Take 2 tablets by mouth daily.      ICAPS Caps   Take 1 capsule by mouth 2 (two) times daily.      insulin detemir 100 UNIT/ML injection   Commonly known as: LEVEMIR   Inject 0-15 Units into the skin See admin instructions. Patient on sliding scale in the morning and takes 15 units at bedtime      ipratropium 0.02 % nebulizer solution   Commonly known as: ATROVENT   Take 2.5 mLs (0.5 mg  total) by nebulization 3 (three) times daily.      metoprolol tartrate 25 MG tablet   Commonly known as: LOPRESSOR   Take 12.5 mg by mouth 2 (two) times daily.      mometasone 50 MCG/ACT nasal spray   Commonly known as: NASONEX   Place 2 sprays into the nose at bedtime as needed. For congestion      omega-3 acid ethyl esters 1 G capsule   Commonly known as: LOVAZA   Take 1 g by mouth daily.      potassium chloride 10 MEQ tablet   Commonly known as: K-DUR   Take 10 mEq by mouth daily.      sodium chloride 0.65 % nasal spray   Commonly known as: OCEAN   Place 1 spray into the nose at bedtime as needed. For congestion      vitamin C 100 MG tablet   Take 100  mg by mouth daily.           Follow-up Information    Follow up with [facility], MD. Schedule an appointment as soon as possible for a visit in 1 week.   Contact information:   [address] [place] [address] [phone]          Signed: Ricki Rodriguez 09/29/2011, 9:22 AM

## 2011-10-02 ENCOUNTER — Encounter (HOSPITAL_COMMUNITY): Payer: Self-pay

## 2011-10-02 ENCOUNTER — Emergency Department (HOSPITAL_COMMUNITY): Payer: PRIVATE HEALTH INSURANCE

## 2011-10-02 ENCOUNTER — Emergency Department (HOSPITAL_COMMUNITY)
Admission: EM | Admit: 2011-10-02 | Discharge: 2011-10-02 | Disposition: A | Discharge status: Home / Self Care | Payer: PRIVATE HEALTH INSURANCE | Attending: Emergency Medicine | Admitting: Emergency Medicine

## 2011-10-02 DIAGNOSIS — E119 Type 2 diabetes mellitus without complications: Secondary | ICD-10-CM | POA: Insufficient documentation

## 2011-10-02 DIAGNOSIS — M542 Cervicalgia: Secondary | ICD-10-CM | POA: Insufficient documentation

## 2011-10-02 DIAGNOSIS — M25559 Pain in unspecified hip: Secondary | ICD-10-CM | POA: Insufficient documentation

## 2011-10-02 DIAGNOSIS — Z79899 Other long term (current) drug therapy: Secondary | ICD-10-CM | POA: Insufficient documentation

## 2011-10-02 DIAGNOSIS — Z8673 Personal history of transient ischemic attack (TIA), and cerebral infarction without residual deficits: Secondary | ICD-10-CM | POA: Insufficient documentation

## 2011-10-02 DIAGNOSIS — I10 Essential (primary) hypertension: Secondary | ICD-10-CM | POA: Insufficient documentation

## 2011-10-02 DIAGNOSIS — W06XXXA Fall from bed, initial encounter: Secondary | ICD-10-CM | POA: Insufficient documentation

## 2011-10-02 DIAGNOSIS — R51 Headache: Secondary | ICD-10-CM | POA: Insufficient documentation

## 2011-10-02 DIAGNOSIS — K219 Gastro-esophageal reflux disease without esophagitis: Secondary | ICD-10-CM | POA: Insufficient documentation

## 2011-10-02 DIAGNOSIS — J449 Chronic obstructive pulmonary disease, unspecified: Secondary | ICD-10-CM | POA: Insufficient documentation

## 2011-10-02 DIAGNOSIS — Z794 Long term (current) use of insulin: Secondary | ICD-10-CM | POA: Insufficient documentation

## 2011-10-02 DIAGNOSIS — M549 Dorsalgia, unspecified: Secondary | ICD-10-CM

## 2011-10-02 DIAGNOSIS — J4489 Other specified chronic obstructive pulmonary disease: Secondary | ICD-10-CM | POA: Insufficient documentation

## 2011-10-02 DIAGNOSIS — M545 Low back pain, unspecified: Secondary | ICD-10-CM | POA: Insufficient documentation

## 2011-10-02 DIAGNOSIS — I252 Old myocardial infarction: Secondary | ICD-10-CM | POA: Insufficient documentation

## 2011-10-02 DIAGNOSIS — W19XXXA Unspecified fall, initial encounter: Secondary | ICD-10-CM

## 2011-10-02 MED ORDER — HYDROCODONE-ACETAMINOPHEN 5-500 MG PO TABS: 1.0000 | ORAL_TABLET | Freq: Four times a day (QID) | ORAL | Status: AC | PRN

## 2011-10-02 NOTE — Discharge Instructions (Signed)
Fortunately your X-rays are normal

## 2011-10-02 NOTE — ED Provider Notes (Signed)
History     CSN: [id number]  Arrival date & time 10/02/11  1703   First MD Initiated Contact with Patient 10/02/11 2003      Chief Complaint  Patient presents with  . Fall    (Consider location/radiation/quality/duration/timing/severity/associated sxs/prior treatment) HPI Comments: Patient family states that they found her next to the bed during the night.  She has been convalescing at their home from surgery in February.  She was given Ambien to sleep about 9 PM she was found about 1:00 in the morning.  Not complaining of any pain.  She was put back to bed.  She got up in the morning at her normal.  Time was given her normal Vicodin for postoperative pain ate breakfast, sat in a chair and proceeded with her normal day, after dinner she started complaining that her back was hurting  Patient is a 78 y.o. female presenting with fall. The history is provided by a caregiver.  Fall The accident occurred 12 to 24 hours ago. The fall occurred in unknown circumstances. She fell from a height of 1 to 2 ft.    Past Medical History  Diagnosis Date  . Diabetes mellitus   . Hypertension   . Vascular disease   . Shortness of breath   . Bladder infection   . COPD (chronic obstructive pulmonary disease)   . Myocardial infarction   . Stroke   . GERD (gastroesophageal reflux disease)   . Diverticulitis     Past Surgical History  Procedure Date  . Bladder tack   . Femoral artery stent   . Abdominal hysterectomy   . Esophagogastroduodenoscopy 07/12/2011    Procedure: ESOPHAGOGASTRODUODENOSCOPY (EGD);  Surgeon: Theda Belfast, MD;  Location: [facility];  Service: Endoscopy;  Laterality: N/A;  bedside   . Flexible sigmoidoscopy 07/12/2011    Procedure: FLEXIBLE SIGMOIDOSCOPY;  Surgeon: Theda Belfast, MD;  Location: [facility];  Service: Endoscopy;  Laterality: N/A;  . Laparotomy 07/12/2011    Procedure: EXPLORATORY LAPAROTOMY;  Surgeon: Kandis Cocking, MD;  Location: [facility];  Service:  General;  Laterality: N/A;  exploratory laparotomy, left sigmoid colectomy, creation of colostomy  . Abdominal surgery   . Colostomy   . Eye surgery     No family history on file.  History  Substance Use Topics  . Smoking status: Never Smoker   . Smokeless tobacco: Never Used  . Alcohol Use: No    OB History    Grav Para Term Preterm Abortions TAB SAB Ect Mult Living                  Review of Systems  Unable to perform ROS Gastrointestinal: Negative for diarrhea.  Musculoskeletal: Negative for back pain.  Skin: Negative for wound.    Allergies  Avelox; Proton pump inhibitors; and Warfarin sodium  Home Medications   Current Outpatient Rx  Name Route Sig Dispense Refill  . ACETAMINOPHEN 500 MG PO TABS Oral Take 1,000 mg by mouth every 6 (six) hours as needed. For pain    . ALBUTEROL SULFATE (5 MG/ML) 0.5% IN NEBU Nebulization Take 0.5 mLs (2.5 mg total) by nebulization 3 (three) times daily. 20 mL 0  . IPRATROPIUM-ALBUTEROL 18-103 MCG/ACT IN AERO Inhalation Inhale 2 puffs into the lungs every 6 (six) hours as needed. For shortness of breath    . ALPRAZOLAM 0.25 MG PO TABS Oral Take 0.25 mg by mouth 3 (three) times daily as needed. anxiety    . VITAMIN C 100  MG PO TABS Oral Take 100 mg by mouth daily.    . ATORVASTATIN CALCIUM 20 MG PO TABS Oral Take 20 mg by mouth at bedtime.    . CEPHALEXIN 500 MG PO CAPS Oral Take 500 mg by mouth 2 (two) times daily. For 7 days; Start date unknown    . DIGOXIN 0.125 MG PO TABS Oral Take 125 mcg by mouth every other day.    Marland Kitchen FAMOTIDINE 20 MG PO TABS Oral Take 20 mg by mouth at bedtime.    . FUROSEMIDE 40 MG PO TABS Oral Take 1 tablet (40 mg total) by mouth daily. 30 tablet 0  . HYDROCODONE-ACETAMINOPHEN 5-500 MG PO TABS Oral Take 1 tablet by mouth every 6 (six) hours as needed for pain. 20 tablet 0  . INSULIN DETEMIR 100 UNIT/ML [place] SOLN Subcutaneous Inject 0-15 Units into the skin See admin instructions. Patient on sliding scale in the  morning and takes 15 units at bedtime    . IPRATROPIUM BROMIDE 0.02 % IN SOLN Nebulization Take 2.5 mLs (0.5 mg total) by nebulization 3 (three) times daily. 75 mL 0  . GERITOL COMPLETE PO Oral Take 2 tablets by mouth daily.    Marland Kitchen METOPROLOL TARTRATE 25 MG PO TABS Oral Take 12.5 mg by mouth 2 (two) times daily.    . MOMETASONE FUROATE 50 MCG/ACT NA SUSP Nasal Place 2 sprays into the nose at bedtime as needed. For congestion    . ICAPS PO CAPS Oral Take 1 capsule by mouth 2 (two) times daily.      BP 146/93  Pulse 84  Temp(Src) 97.3 F (36.3 C) (Oral)  Resp 20  SpO2 95%  Physical Exam  Constitutional: She appears well-developed and well-nourished.  HENT:  Head: Normocephalic.  Eyes: Pupils are equal, round, and reactive to light.  Neck: Normal range of motion.  Cardiovascular: Normal rate.   Pulmonary/Chest: Effort normal.  Abdominal: Soft. There is no tenderness.       Midline incision with wound VAC in place.  Left-sided colostomy with soft yellow stool present in the collection bag  Musculoskeletal: She exhibits no edema and no tenderness.  Neurological: She is alert.       Alert but confused which is patient's normal baseline  Skin: Skin is warm and dry. No rash noted. No erythema. There is pallor.    ED Course  Procedures (including critical care time)  Labs Reviewed - No data to display Dg Cervical Spine Complete  10/02/2011  *RADIOLOGY REPORT*  Clinical Data: Left neck pain status post fall.  CERVICAL SPINE - COMPLETE 4+ VIEW  Comparison: None.  Findings: Advanced multilevel degenerative changes with multilevel facet arthropathy and degenerative disc disease most pronounced at C5-6.  There is mild vertebral body height loss at C5. Otherwise maintained vertebral body height and alignment.  No prevertebral soft tissue swelling.  Maintained cranial cervical and C1-2 articulation, though the odontoid view is slightly limited by rotation.  Lung apices are clear.  Atherosclerotic  calcification of the aortic arch.  IMPRESSION: Advanced multilevel degenerative changes.  Mild vertebral body height loss at C5 is age indeterminate though favored remote. Correlate with point tenderness.  Original Report Authenticated By: Waneta Martins, M.D.   Dg Lumbar Spine Complete  10/02/2011  *RADIOLOGY REPORT*  Clinical Data: Fall 1 day ago.  Left-sided pain radiating to lateral hip.  LUMBAR SPINE - COMPLETE 4+ VIEW  Comparison: Pelvis radiograph 10/02/2011 and lumbar spine radiographs 09/13/2011  Findings: Lumbar spine vertebral bodies  are in stable alignment. Grade 1 anterolisthesis at L3 on L4 is unchanged.  The remainder of the lumbar spine is aligned.  Marked degenerative change with near complete loss of the disc space at L4-5 is stable.  Facet joint degenerative changes throughout the lumbar spine.  No evidence of fracture.  IVC filter projects to the right of midline, and stable position.  IMPRESSION: Stable chronic degenerative changes.  No acute bony abnormality.  Original Report Authenticated By: Britta Mccreedy, M.D.   Dg Pelvis 1-2 Views  10/02/2011  *RADIOLOGY REPORT*  Clinical Data: Status post fall 1 day ago.  Left-sided pain radiating to left.  PELVIS - 1-2 VIEW  Comparison: .  Pelvis radiograph 07/07/2011  Findings: Both femoral heads project over the acetabulae.  No fracture or diastasis is identified. Sacroiliac joints appear normal.  The bones appear osteopenic.  IMPRESSION: No acute bony abnormality identified.  Osteopenia.  Original Report Authenticated By: Britta Mccreedy, M.D.   Ct Head Wo Contrast  10/02/2011  *RADIOLOGY REPORT*  Clinical Data: History of fall complaining of head pain.  CT HEAD WITHOUT CONTRAST  Technique:  Contiguous axial images were obtained from the base of the skull through the vertex without contrast.  Comparison: CT head 06/04/2009.  Findings: No acute displaced skull fractures are identified.  There is mild cerebral and cerebellar atrophy.  There are  several well- defined foci of decreased attenuation in the cerebellar hemispheres bilaterally, compatible with old lacunar infarctions. Similarly, there is an old lacunar infarction in the left external capsule, and left thalamus (image 13 of series 2).  Additionally, there are extensive patchy and confluent areas of decreased attenuation throughout the deep and periventricular white matter of the cerebral hemispheres bilaterally, compatible with chronic microvascular ischemic changes.  No definite acute intracranial abnormalities.  Specifically, no definite evidence of acute post- traumatic intracranial hemorrhage, no overt signs of acute/subacute ischemia (accurate assessment for this upon this background of chronic microvascular ischemic changes as it is exceedingly difficult), no focal mass, mass effect, hydrocephalus or abnormal intra or extra-axial fluid collections. Visualized paranasal sinuses and mastoids are well pneumatized. Scleral calcifications bilaterally, and increased density in the vitreous of the right globe suggesting prior vitreous hemorrhage (similar to prior).  IMPRESSION: 1.  No acute displaced skull fracture or evidence of significant acute intracranial trauma. 2.  Mild cerebral and cerebellar atrophy with old lacunar infarctions in the left external capsule, left thalamus and bilateral cerebellar hemispheres, as well as chronic microvascular ischemic changes throughout the white matter of the cerebral hemispheres bilaterally.  Original Report Authenticated By: Florencia Reasons, M.D.     1. Back pain   2. Fall     At time of DC family reports being out of Hydrocodone  [employer]  School during the middle of the night.  Patient has been and the tori and at her normal baseline activity.  Since then, but started developing low back pain.  Approximately 20 hours later.  X-rays of her head, neck, and lower back are negative for any fractures or pathology.         Arman Filter,  NP 10/02/11 2145  Arman Filter, NP 10/02/11 2145  Arman Filter, NP 10/03/11 [phone]

## 2011-10-02 NOTE — ED Notes (Signed)
Family states they gave her an Ambien last night

## 2011-10-02 NOTE — ED Notes (Signed)
[name] family reports that patient was found at the side of the bed during the night. Patient had taken a an Ambien prior to falling  Family gave patient a Vicodin for pain, but this afternoon patient c/o increased pain to the left flank area. Patient requested to be taken to the ED.

## 2011-10-03 NOTE — ED Provider Notes (Signed)
Medical screening examination/treatment/procedure(s) were conducted as a shared visit with non-physician practitioner(s) and myself.  I personally evaluated the patient during the encounter   Loren Racer, MD 10/03/11 1547

## 2011-10-21 ENCOUNTER — Inpatient Hospital Stay (HOSPITAL_COMMUNITY)
Admission: EM | Admit: 2011-10-21 | Discharge: 2011-10-23 | DRG: 377 | Disposition: A | Discharge status: Home / Self Care | Payer: PRIVATE HEALTH INSURANCE | Attending: Internal Medicine | Admitting: Internal Medicine

## 2011-10-21 ENCOUNTER — Encounter (HOSPITAL_COMMUNITY): Payer: Self-pay | Admitting: *Deleted

## 2011-10-21 DIAGNOSIS — M549 Dorsalgia, unspecified: Secondary | ICD-10-CM

## 2011-10-21 DIAGNOSIS — I447 Left bundle-branch block, unspecified: Secondary | ICD-10-CM

## 2011-10-21 DIAGNOSIS — J449 Chronic obstructive pulmonary disease, unspecified: Secondary | ICD-10-CM | POA: Diagnosis present

## 2011-10-21 DIAGNOSIS — I252 Old myocardial infarction: Secondary | ICD-10-CM

## 2011-10-21 DIAGNOSIS — K922 Gastrointestinal hemorrhage, unspecified: Secondary | ICD-10-CM

## 2011-10-21 DIAGNOSIS — E871 Hypo-osmolality and hyponatremia: Secondary | ICD-10-CM

## 2011-10-21 DIAGNOSIS — J811 Chronic pulmonary edema: Secondary | ICD-10-CM

## 2011-10-21 DIAGNOSIS — Z933 Colostomy status: Secondary | ICD-10-CM

## 2011-10-21 DIAGNOSIS — I10 Essential (primary) hypertension: Secondary | ICD-10-CM

## 2011-10-21 DIAGNOSIS — B3749 Other urogenital candidiasis: Secondary | ICD-10-CM

## 2011-10-21 DIAGNOSIS — K5731 Diverticulosis of large intestine without perforation or abscess with bleeding: Secondary | ICD-10-CM

## 2011-10-21 DIAGNOSIS — G9340 Encephalopathy, unspecified: Secondary | ICD-10-CM | POA: Diagnosis present

## 2011-10-21 DIAGNOSIS — N179 Acute kidney failure, unspecified: Secondary | ICD-10-CM

## 2011-10-21 DIAGNOSIS — N390 Urinary tract infection, site not specified: Secondary | ICD-10-CM

## 2011-10-21 DIAGNOSIS — N183 Chronic kidney disease, stage 3 unspecified: Secondary | ICD-10-CM

## 2011-10-21 DIAGNOSIS — R0600 Dyspnea, unspecified: Secondary | ICD-10-CM

## 2011-10-21 DIAGNOSIS — I509 Heart failure, unspecified: Secondary | ICD-10-CM | POA: Diagnosis present

## 2011-10-21 DIAGNOSIS — J4489 Other specified chronic obstructive pulmonary disease: Secondary | ICD-10-CM | POA: Diagnosis present

## 2011-10-21 DIAGNOSIS — I5022 Chronic systolic (congestive) heart failure: Secondary | ICD-10-CM

## 2011-10-21 DIAGNOSIS — Z9049 Acquired absence of other specified parts of digestive tract: Secondary | ICD-10-CM

## 2011-10-21 DIAGNOSIS — D696 Thrombocytopenia, unspecified: Secondary | ICD-10-CM

## 2011-10-21 DIAGNOSIS — I5041 Acute combined systolic (congestive) and diastolic (congestive) heart failure: Secondary | ICD-10-CM

## 2011-10-21 DIAGNOSIS — K5521 Angiodysplasia of colon with hemorrhage: Principal | ICD-10-CM | POA: Diagnosis present

## 2011-10-21 DIAGNOSIS — E119 Type 2 diabetes mellitus without complications: Secondary | ICD-10-CM

## 2011-10-21 HISTORY — DX: Heart failure, unspecified: I50.9

## 2011-10-21 NOTE — ED Notes (Signed)
Pt family noted blood from rectum a week ago that was a small amount and the stopped. Pt had large amount of dark red blood from rectum today noted by family. Pt has a colostomy bag and wound vac. Pt family has not noticed blood in colostomy. Pt has become more lethargic and weak. Pt has not been eating as much and is unable to sit up as much in the last week. Pt had colostomy placed on February for diverticulitis.

## 2011-10-22 ENCOUNTER — Encounter (HOSPITAL_COMMUNITY): Payer: Self-pay | Admitting: Internal Medicine

## 2011-10-22 ENCOUNTER — Inpatient Hospital Stay (HOSPITAL_COMMUNITY): Payer: PRIVATE HEALTH INSURANCE

## 2011-10-22 ENCOUNTER — Emergency Department (HOSPITAL_COMMUNITY): Payer: PRIVATE HEALTH INSURANCE

## 2011-10-22 DIAGNOSIS — E871 Hypo-osmolality and hyponatremia: Secondary | ICD-10-CM

## 2011-10-22 DIAGNOSIS — R609 Edema, unspecified: Secondary | ICD-10-CM

## 2011-10-22 DIAGNOSIS — K625 Hemorrhage of anus and rectum: Secondary | ICD-10-CM

## 2011-10-22 DIAGNOSIS — E1165 Type 2 diabetes mellitus with hyperglycemia: Secondary | ICD-10-CM

## 2011-10-22 DIAGNOSIS — N390 Urinary tract infection, site not specified: Secondary | ICD-10-CM | POA: Diagnosis present

## 2011-10-22 DIAGNOSIS — K921 Melena: Secondary | ICD-10-CM

## 2011-10-22 LAB — COMPREHENSIVE METABOLIC PANEL
ALT: 24 U/L (ref 0–35)
ALT: 22 U/L (ref 0–35)
AST: 33 U/L (ref 0–37)
AST: 28 U/L (ref 0–37)
Albumin: 2.7 g/dL — ABNORMAL LOW (ref 3.5–5.2)
Albumin: 2.4 g/dL — ABNORMAL LOW (ref 3.5–5.2)
Alkaline Phosphatase: 165 U/L — ABNORMAL HIGH (ref 39–117)
Alkaline Phosphatase: 142 U/L — ABNORMAL HIGH (ref 39–117)
CO2: 18 mEq/L — ABNORMAL LOW (ref 19–32)
Chloride: 92 mEq/L — ABNORMAL LOW (ref 96–112)
Chloride: 98 mEq/L (ref 96–112)
GFR calc non Af Amer: 26 mL/min — ABNORMAL LOW (ref >90)
Potassium: 5.1 mEq/L (ref 3.5–5.1)
Potassium: 4.8 mEq/L (ref 3.5–5.1)
Sodium: 125 mEq/L — ABNORMAL LOW (ref 135–145)
Total Bilirubin: 0.6 mg/dL (ref 0.3–1.2)
Total Bilirubin: 0.6 mg/dL (ref 0.3–1.2)

## 2011-10-22 LAB — CBC
MCH: 27.1 pg (ref 26.0–34.0)
MCHC: 31.3 g/dL (ref 30.0–36.0)
MCV: 87.3 fL (ref 78.0–100.0)
Platelets: 134 10*3/uL — ABNORMAL LOW (ref 150–400)
Platelets: 123 10*3/uL — ABNORMAL LOW (ref 150–400)
RBC: 5.05 MIL/uL (ref 3.87–5.11)
RDW: 21.9 % — ABNORMAL HIGH (ref 11.5–15.5)
RDW: 21.6 % — ABNORMAL HIGH (ref 11.5–15.5)
RDW: 21.6 % — ABNORMAL HIGH (ref 11.5–15.5)
WBC: 9.2 10*3/uL (ref 4.0–10.5)

## 2011-10-22 LAB — TYPE AND SCREEN: Antibody Screen: NEGATIVE

## 2011-10-22 LAB — GLUCOSE, CAPILLARY
Glucose-Capillary: 177 mg/dL — ABNORMAL HIGH (ref 70–99)
Glucose-Capillary: 153 mg/dL — ABNORMAL HIGH (ref 70–99)

## 2011-10-22 LAB — URINALYSIS, ROUTINE W REFLEX MICROSCOPIC
Protein, ur: 30 mg/dL — AB
Urobilinogen, UA: 0.2 mg/dL (ref 0.0–1.0)

## 2011-10-22 LAB — SODIUM, URINE, RANDOM: Sodium, Ur: 30 mEq/L

## 2011-10-22 LAB — CARDIAC PANEL(CRET KIN+CKTOT+MB+TROPI)
CK, MB: 4.2 ng/mL — ABNORMAL HIGH (ref 0.3–4.0)
CK, MB: 4.4 ng/mL — ABNORMAL HIGH (ref 0.3–4.0)
Relative Index: INVALID (ref 0.0–2.5)
Total CK: 53 U/L (ref 7–177)

## 2011-10-22 LAB — HEMOGLOBIN A1C
Hgb A1c MFr Bld: 6.8 % — ABNORMAL HIGH (ref <5.7)
Mean Plasma Glucose: 148 mg/dL — ABNORMAL HIGH (ref <117)

## 2011-10-22 LAB — OCCULT BLOOD, POC DEVICE: Fecal Occult Bld: POSITIVE

## 2011-10-22 LAB — OSMOLALITY: Osmolality: 289 mOsm/kg (ref 275–300)

## 2011-10-22 MED ORDER — FAMOTIDINE 20 MG PO TABS
20.0000 mg | ORAL_TABLET | Freq: Every day | ORAL | Status: DC
  Administered 2011-10-22: 20 mg via ORAL
  Filled 2011-10-22 (×2): qty 1

## 2011-10-22 MED ORDER — HYDROCODONE-ACETAMINOPHEN 5-325 MG PO TABS: 1.0000 | ORAL_TABLET | ORAL | Status: DC | PRN

## 2011-10-22 MED ORDER — SODIUM CHLORIDE 0.9 % IJ SOLN: 3.0000 mL | Freq: Two times a day (BID) | INTRAMUSCULAR | Status: DC

## 2011-10-22 MED ORDER — ACETAMINOPHEN 325 MG PO TABS
650.0000 mg | ORAL_TABLET | Freq: Four times a day (QID) | ORAL | Status: DC | PRN
  Administered 2011-10-23: 650 mg via ORAL
  Filled 2011-10-22: qty 2

## 2011-10-22 MED ORDER — DIGOXIN 125 MCG PO TABS
125.0000 ug | ORAL_TABLET | ORAL | Status: DC
  Filled 2011-10-22: qty 1

## 2011-10-22 MED ORDER — METOPROLOL TARTRATE 12.5 MG HALF TABLET
12.5000 mg | ORAL_TABLET | Freq: Two times a day (BID) | ORAL | Status: DC
  Administered 2011-10-22 (×2): 12.5 mg via ORAL
  Filled 2011-10-22 (×4): qty 1

## 2011-10-22 MED ORDER — ALBUTEROL SULFATE (5 MG/ML) 0.5% IN NEBU: 2.5000 mg | INHALATION_SOLUTION | RESPIRATORY_TRACT | Status: DC | PRN

## 2011-10-22 MED ORDER — INSULIN ASPART 100 UNIT/ML ~~LOC~~ SOLN
0.0000 [IU] | SUBCUTANEOUS | Status: DC
  Administered 2011-10-22 (×4): 2 [IU] via SUBCUTANEOUS
  Filled 2011-10-22 (×2): qty 1

## 2011-10-22 MED ORDER — ALBUTEROL SULFATE (5 MG/ML) 0.5% IN NEBU
2.5000 mg | INHALATION_SOLUTION | Freq: Three times a day (TID) | RESPIRATORY_TRACT | Status: DC
  Administered 2011-10-22 – 2011-10-23 (×4): 2.5 mg via RESPIRATORY_TRACT
  Filled 2011-10-22 (×4): qty 0.5

## 2011-10-22 MED ORDER — SODIUM CHLORIDE 0.9 % IV SOLN: INTRAVENOUS | Status: DC

## 2011-10-22 MED ORDER — SODIUM CHLORIDE 0.9 % IV BOLUS (SEPSIS)
1000.0000 mL | Freq: Once | INTRAVENOUS | Status: AC
  Administered 2011-10-22: 1000 mL via INTRAVENOUS

## 2011-10-22 MED ORDER — FLUTICASONE PROPIONATE 50 MCG/ACT NA SUSP
1.0000 | Freq: Every day | NASAL | Status: DC
  Administered 2011-10-22: 1 via NASAL
  Filled 2011-10-22: qty 16

## 2011-10-22 MED ORDER — SIMVASTATIN 40 MG PO TABS
40.0000 mg | ORAL_TABLET | Freq: Every day | ORAL | Status: DC
  Administered 2011-10-22: 40 mg via ORAL
  Filled 2011-10-22 (×2): qty 1

## 2011-10-22 MED ORDER — SODIUM CHLORIDE 0.9 % IV SOLN
INTRAVENOUS | Status: DC
  Administered 2011-10-22: 13:00:00 via INTRAVENOUS

## 2011-10-22 MED ORDER — GUAIFENESIN-DM 100-10 MG/5ML PO SYRP
5.0000 mL | ORAL_SOLUTION | ORAL | Status: DC | PRN
  Administered 2011-10-23: 5 mL via ORAL
  Filled 2011-10-22: qty 10

## 2011-10-22 MED ORDER — ONDANSETRON HCL 4 MG/2ML IJ SOLN: 4.0000 mg | Freq: Four times a day (QID) | INTRAMUSCULAR | Status: DC | PRN

## 2011-10-22 MED ORDER — ALPRAZOLAM 0.25 MG PO TABS: 0.2500 mg | ORAL_TABLET | Freq: Three times a day (TID) | ORAL | Status: DC | PRN

## 2011-10-22 MED ORDER — DOCUSATE SODIUM 100 MG PO CAPS
100.0000 mg | ORAL_CAPSULE | Freq: Two times a day (BID) | ORAL | Status: DC
  Administered 2011-10-22 (×2): 100 mg via ORAL
  Filled 2011-10-22 (×3): qty 1

## 2011-10-22 MED ORDER — DEXTROSE 5 % IV SOLN
1.0000 g | INTRAVENOUS | Status: DC
  Administered 2011-10-22 – 2011-10-23 (×2): 1 g via INTRAVENOUS
  Filled 2011-10-22 (×2): qty 10

## 2011-10-22 MED ORDER — IPRATROPIUM BROMIDE 0.02 % IN SOLN
0.5000 mg | Freq: Three times a day (TID) | RESPIRATORY_TRACT | Status: DC
  Administered 2011-10-22 – 2011-10-23 (×4): 0.5 mg via RESPIRATORY_TRACT
  Filled 2011-10-22 (×4): qty 2.5

## 2011-10-22 MED ORDER — FLEET ENEMA 7-19 GM/118ML RE ENEM
2.0000 | ENEMA | Freq: Once | RECTAL | Status: AC
  Administered 2011-10-23: 2 via RECTAL
  Filled 2011-10-22: qty 1

## 2011-10-22 MED ORDER — ONDANSETRON HCL 4 MG PO TABS: 4.0000 mg | ORAL_TABLET | Freq: Four times a day (QID) | ORAL | Status: DC | PRN

## 2011-10-22 MED ORDER — ACETAMINOPHEN 650 MG RE SUPP: 650.0000 mg | Freq: Four times a day (QID) | RECTAL | Status: DC | PRN

## 2011-10-22 NOTE — Consult Note (Signed)
Reason for Consult: Hematochezia Referring Physician: [facility]  [name] HPI: This is an 78 year old female who is s/p left hemicolectomy for a diverticular bleed on 06/2011.  At that time she was bleeding profusely and her EGD was negative for any bleeding source.  She tolerated surgery well and post-operatively she did have some wound healing issues.  Just prior to her admission she was noted to be lethargic and not able to perform any of her ADLs.  There was a report by family that she had hematochezia, which is the reason for the GI consult.  She had two episodes just before coming into the hospital.  Her family reports that it appeared to be "old rotten blood".  She is s/p sigmoid colectomy with a Hartman's pouch.  No blood in the pouch.  Currently the patient is also noted to have an UTI.  Past Medical History  Diagnosis Date  . Diabetes mellitus   . Hypertension   . Vascular disease   . Shortness of breath   . Bladder infection   . COPD (chronic obstructive pulmonary disease)   . Myocardial infarction   . Stroke   . GERD (gastroesophageal reflux disease)   . Diverticulitis   . CHF (congestive heart failure)     Past Surgical History  Procedure Date  . Bladder tack   . Femoral artery stent   . Abdominal hysterectomy   . Esophagogastroduodenoscopy 07/12/2011    Procedure: ESOPHAGOGASTRODUODENOSCOPY (EGD);  Surgeon: Theda Belfast, MD;  Location: [facility];  Service: Endoscopy;  Laterality: N/A;  bedside   . Flexible sigmoidoscopy 07/12/2011    Procedure: FLEXIBLE SIGMOIDOSCOPY;  Surgeon: Theda Belfast, MD;  Location: [facility];  Service: Endoscopy;  Laterality: N/A;  . Laparotomy 07/12/2011    Procedure: EXPLORATORY LAPAROTOMY;  Surgeon: Kandis Cocking, MD;  Location: [facility];  Service: General;  Laterality: N/A;  exploratory laparotomy, left sigmoid colectomy, creation of colostomy  . Abdominal surgery   . Colostomy   . Eye surgery   . Ivc filter   .  Cataract extraction, bilateral   . Retina reattachment surgery     No family history on file.  Social History:  reports that she has quit smoking. She has never used smokeless tobacco. She reports that she does not drink alcohol or use illicit drugs.  Allergies:  Allergies  Allergen Reactions  . Avelox (Moxifloxacin Hcl In Nacl) Other (See Comments)    unknown  . Proton Pump Inhibitors Other (See Comments)    Thrombocytopenia  . Warfarin Sodium Other (See Comments)    Causes severe bleeding    Medications:  Scheduled:   . cefTRIAXone (ROCEPHIN)  IV  1 g Intravenous Q24H  . insulin aspart  0-9 Units Subcutaneous Q4H  . sodium chloride  1,000 mL Intravenous Once   Continuous:   . sodium chloride 75 mL/hr at 10/22/11 1318    Results for orders placed during the hospital encounter of 10/21/11 (from the past 24 hour(s))  CBC     Status: Abnormal   Collection Time   10/22/11 12:06 AM      Component Value Range   WBC 9.2  4.0 - 10.5 (K/uL)   RBC 5.05  3.87 - 5.11 (MIL/uL)   Hemoglobin 13.6  12.0 - 15.0 (g/dL)   HCT 16.1  09.6 - 04.5 (%)   MCV 86.7  78.0 - 100.0 (fL)   MCH 26.9  26.0 - 34.0 (pg)   MCHC 31.1  30.0 - 36.0 (g/dL)   RDW 84.6 (*) 96.2 - 15.5 (%)   Platelets 134 (*) 150 - 400 (K/uL)  COMPREHENSIVE METABOLIC PANEL     Status: Abnormal   Collection Time   10/22/11 12:06 AM      Component Value Range   Sodium 125 (*) 135 - 145 (mEq/L)   Potassium 5.1  3.5 - 5.1 (mEq/L)   Chloride 92 (*) 96 - 112 (mEq/L)   CO2 18 (*) 19 - 32 (mEq/L)   Glucose, Bld 148 (*) 70 - 99 (mg/dL)   BUN 63 (*) 6 - 23 (mg/dL)   Creatinine, Ser 9.52 (*) 0.50 - 1.10 (mg/dL)   Calcium 8.7  8.4 - 84.1 (mg/dL)   Total Protein 6.8  6.0 - 8.3 (g/dL)   Albumin 2.7 (*) 3.5 - 5.2 (g/dL)   AST 33  0 - 37 (U/L)   ALT 24  0 - 35 (U/L)   Alkaline Phosphatase 165 (*) 39 - 117 (U/L)   Total Bilirubin 0.6  0.3 - 1.2 (mg/dL)   GFR calc non Af Amer 26 (*) >90 (mL/min)   GFR calc Af Amer 31 (*) >90  (mL/min)  GLUCOSE, CAPILLARY     Status: Abnormal   Collection Time   10/22/11 12:20 AM      Component Value Range   Glucose-Capillary 153 (*) 70 - 99 (mg/dL)   Comment 1 Notify RN    TYPE AND SCREEN     Status: Normal   Collection Time   10/22/11  1:30 AM      Component Value Range   ABO/RH(D) A POS     Antibody Screen NEG     Sample Expiration 10/25/2011    DIGOXIN LEVEL     Status: Normal   Collection Time   10/22/11  1:30 AM      Component Value Range   Digoxin Level 0.8  0.8 - 2.0 (ng/mL)  OCCULT BLOOD, POC DEVICE     Status: Normal   Collection Time   10/22/11  1:46 AM      Component Value Range   Fecal Occult Bld POSITIVE    GLUCOSE, CAPILLARY     Status: Abnormal   Collection Time   10/22/11  7:34 AM      Component Value Range   Glucose-Capillary 177 (*) 70 - 99 (mg/dL)  OSMOLALITY     Status: Normal   Collection Time   10/22/11  7:50 AM      Component Value Range   Osmolality 289  275 - 300 (mOsm/kg)  URINALYSIS, ROUTINE W REFLEX MICROSCOPIC     Status: Abnormal   Collection Time   10/22/11 11:44 AM      Component Value Range   Color, Urine BROWN (*) YELLOW    APPearance TURBID (*) CLEAR    Specific Gravity, Urine 1.018  1.005 - 1.030    pH 5.5  5.0 - 8.0    Glucose, UA NEGATIVE  NEGATIVE (mg/dL)   Hgb urine dipstick LARGE (*) NEGATIVE    Bilirubin Urine NEGATIVE  NEGATIVE    Ketones, ur NEGATIVE  NEGATIVE (mg/dL)   Protein, ur 30 (*) NEGATIVE (mg/dL)   Urobilinogen, UA 0.2  0.0 - 1.0 (mg/dL)   Nitrite POSITIVE (*) NEGATIVE    Leukocytes, UA LARGE (*) NEGATIVE   URINE MICROSCOPIC-ADD ON     Status: Normal   Collection Time   10/22/11 11:44 AM      Component Value Range   WBC, UA  TOO NUMEROUS TO COUNT  <3 (WBC/hpf)   Urine-Other FIELD OBSCURED BY WBC'S    GLUCOSE, CAPILLARY     Status: Abnormal   Collection Time   10/22/11 12:16 PM      Component Value Range   Glucose-Capillary 175 (*) 70 - 99 (mg/dL)     Ct Abdomen Pelvis Wo Contrast  10/22/2011  *RADIOLOGY  REPORT*  Clinical Data: GI bleed.  CT ABDOMEN AND PELVIS WITHOUT CONTRAST  Technique:  Multidetector CT imaging of the abdomen and pelvis was performed following the standard protocol without intravenous contrast.  Comparison: None  Findings: The lung bases demonstrate bilateral pleural effusions and overlying atelectasis.  The heart is enlarged.  No pericardial effusion.  Extensive coronary artery calcifications are noted.  Findings worrisome for right breast cancer with an area of fairly significant skin thickening and subareolar density which could reflect a mass.  Recommend correlation with physical examination, mammography and ultrasound is indicated.  The unenhanced appearance of the liver is unremarkable.  No focal lesions or biliary dilatation.  The gallbladder appears heterogeneous and may contain sludge.  No obvious common bile duct dilatation.  The pancreas is atrophied.  The spleen is normal in size.  The adrenal glands and kidneys are grossly normal.  Renal cysts are noted.  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 An IVC filter is in place.  The stomach, duodenum, small bowel and colon are grossly normal without oral contrast.  A left lower quadrant colostomy is noted.  There is a small to moderate amount of free pelvic fluid and there is diffuse body wall edema suggesting anasarca.  The bladder appears normal.  High attenuation material in the rectum is most likely blood.  IMPRESSION:  1.  Cannot exclude a right-sided breast cancer.  Recommend clinical correlation as discussed above. 2.  Bilateral pleural effusions with overlying atelectasis. 3.  High attenuation material in the rectum could reflect hemorrhage. 4.  Left lower quadrant colostomy. 5.  Small to moderate amount of free pelvic fluid. 6.  Diffuse body wall edema.  Original Report Authenticated By: P. Loralie Champagne, M.D.    ROS:  As stated above in the HPI otherwise negative.  Blood pressure 151/80, pulse 90, temperature 97.6  F (36.4 C), temperature source Oral, resp. rate 20, SpO2 100.00%.    PE: Gen: NAD, Alert and Oriented HEENT:  [place]/AT, EOMI Neck: Supple, no LAD Lungs: CTA Bilaterally CV: RRR without M/G/R ABM: Soft, NTND, +BS Ext: No C/C/E  Assessment/Plan: 1) Hematochezia. 2) Recent history of a diverticular bleed s/p sigmoid colectomy with a Hartman's Pouch. 3) UTI   The rectal examination revealed maroon stool.  I do not know the length of the patient's Hartman's pouch, but with the findings of the maroon stool I will perform a FFS.  Her HGB appears to be stable at this time and it may be that she is hemoconcentrated.    Plan: 1) FFS tomorrow. 2) Follow HGB and transfuse if necessary.  Zykia Walla D 10/22/2011, 12:54 PM

## 2011-10-22 NOTE — Consult Note (Signed)
Reason for Consult:Rectal Bleeding Referring Physician: [name] is an 78 y.o. female.  HPI: The patient is an 78 year old female who presented with an acute GI bleed every 07/12/11. Sigmoidoscopy revealed diffuse sigmoid colon diverticulosis no bleeding source could be identified. A bleeding scan showed bleeding at the junction of the left colon in the sigmoid. She then underwent emergent extended left sigmoid colectomy mobilization of the splenic flexure Hartman's pouch and end left transverse colostomy. She had a slow postoperative course and has multiple medical issues. She was most recently hospitalized last month with congestive heart failure from 5/6-5/10/13 by Dr. Algie Coffer. Patient brought the patient to the emergency room today because of change in her mental status, she's been somnolent for the last 2-3 days. She's also had some blood  from her rectum. The first episode was about a week ago, that she had another episode last evening, which according to the family was less than a tablespoon of old blood. On admission now she is bed bound. Her family does all of her care, she is able to get out of bed to a chair, and can normally helps stand and pivot. We were asked to see in consultation for rectal bleeding, and wound VAC care.  Past Medical History  Diagnosis Date  . Diabetes mellitus   . Hypertension   . Vascular disease   . Shortness of breath   . Bladder infection   . COPD (chronic obstructive pulmonary disease)   . Myocardial infarction   . Stroke   . GERD (gastroesophageal reflux disease)   . Diverticulitis/diverticular bleed with extended left sigmoid colectomy, mobilization of the  splenic flexure, Hartman's pouch and end left transverse colon colostomy 07/12/11 Dr. Ezzard Standing   . CHF (congestive heart failure) EF 37 %     Past Surgical History  Procedure Date  . Bladder tack   . Femoral artery stent   . Abdominal hysterectomy   . Esophagogastroduodenoscopy 07/12/2011      Procedure: ESOPHAGOGASTRODUODENOSCOPY (EGD);  Surgeon: Theda Belfast, MD;  Location: [facility];  Service: Endoscopy;  Laterality: N/A;  bedside   . Flexible sigmoidoscopy 07/12/2011    Procedure: FLEXIBLE SIGMOIDOSCOPY;  Surgeon: Theda Belfast, MD;  Location: [facility];  Service: Endoscopy;  Laterality: N/A;  . Laparotomy 07/12/2011    Procedure: EXPLORATORY LAPAROTOMY;  Surgeon: Kandis Cocking, MD;  Location: [facility];  Service: General;  Laterality: N/A;  exploratory laparotomy, left sigmoid colectomy, creation of colostomy  . Abdominal surgery   . Colostomy   . Eye surgery   . Ivc filter   . Cataract extraction, bilateral   . Retina reattachment surgery     No family history on file.  Social History:  reports that she has quit smoking. She has never used smokeless tobacco. She reports that she does not drink alcohol or use illicit drugs.  Allergies:  Allergies  Allergen Reactions  . Avelox (Moxifloxacin Hcl In Nacl) Other (See Comments)    unknown  . Proton Pump Inhibitors Other (See Comments)    Thrombocytopenia  . Warfarin Sodium Other (See Comments)    Causes severe bleeding    Medications:  Prior to Admission:  Prescriptions prior to admission  Medication Sig Dispense Refill  . acetaminophen (TYLENOL) 500 MG tablet Take 1,000 mg by mouth every 6 (six) hours as needed. For pain      . albuterol (PROVENTIL) (5 MG/ML) 0.5% nebulizer solution Take 0.5 mLs (2.5 mg total) by nebulization 3 (three) times  daily.  20 mL  0  . albuterol-ipratropium (COMBIVENT) 18-103 MCG/ACT inhaler Inhale 2 puffs into the lungs every 6 (six) hours as needed. For shortness of breath      . ALPRAZolam (XANAX) 0.25 MG tablet Take 0.25 mg by mouth 3 (three) times daily as needed. anxiety      . Ascorbic Acid (VITAMIN C) 100 MG tablet Take 100 mg by mouth daily.      Marland Kitchen atorvastatin (LIPITOR) 20 MG tablet Take 20 mg by mouth at bedtime.      . digoxin ([name]) 0.125 MG tablet Take 125 mcg by  mouth every other day.      . famotidine (PEPCID) 20 MG tablet Take 20 mg by mouth at bedtime.      . furosemide (LASIX) 40 MG tablet Take 40 mg by mouth daily.       . insulin detemir (LEVEMIR) 100 UNIT/ML injection Inject 0-15 Units into the skin See admin instructions. Patient on sliding scale in the morning and takes 15 units at bedtime      . ipratropium (ATROVENT) 0.02 % nebulizer solution Take 2.5 mLs (0.5 mg total) by nebulization 3 (three) times daily.  75 mL  0  . Iron-Vitamins (GERITOL COMPLETE PO) Take 2 tablets by mouth daily.      . metoprolol tartrate (LOPRESSOR) 25 MG tablet Take 12.5 mg by mouth 2 (two) times daily.      . mometasone (NASONEX) 50 MCG/ACT nasal spray Place 2 sprays into the nose at bedtime as needed. For congestion      . Multiple Vitamins-Minerals (ICAPS) CAPS Take 1 capsule by mouth 2 (two) times daily.       Scheduled:   . albuterol  2.5 mg Nebulization TID  . cefTRIAXone (ROCEPHIN)  IV  1 g Intravenous Q24H  . digoxin  125 mcg Oral QODAY  . docusate sodium  100 mg Oral BID  . famotidine  20 mg Oral QHS  . fluticasone  1 spray Each Nare Daily  . furosemide  40 mg Intravenous Once  . furosemide  20 mg Oral Daily  . insulin aspart  0-9 Units Subcutaneous Q4H  . ipratropium  0.5 mg Nebulization TID  . metoprolol tartrate  12.5 mg Oral BID  . simvastatin  40 mg Oral q1800  . sodium chloride  3 mL Intravenous Q12H  . sodium phosphate  2 enema Rectal Once   Continuous:   . DISCONTD: sodium chloride    . DISCONTD: sodium chloride 50 mL/hr (10/22/11 1704)   AVW:[id number], acetaminophen, albuterol, ALPRAZolam, guaiFENesin-dextromethorphan, HYDROcodone-acetaminophen, ondansetron (ZOFRAN) IV, ondansetron Anti-infectives     Start     Dose/Rate Route Frequency Ordered Stop   10/22/11 1245   cefTRIAXone (ROCEPHIN) 1 g in dextrose 5 % 50 mL IVPB        1 g 100 mL/hr over 30 Minutes Intravenous Every 24 hours 10/22/11 1242            Results for  orders placed during the hospital encounter of 10/21/11 (from the past 48 hour(s))  CBC     Status: Abnormal   Collection Time   10/22/11 12:06 AM      Component Value Range Comment   WBC 9.2  4.0 - 10.5 (K/uL)    RBC 5.05  3.87 - 5.11 (MIL/uL)    Hemoglobin 13.6  12.0 - 15.0 (g/dL)    HCT 62.1  30.8 - 65.7 (%)    MCV 86.7  78.0 - 100.0 (fL)  MCH 26.9  26.0 - 34.0 (pg)    MCHC 31.1  30.0 - 36.0 (g/dL)    RDW 40.9 (*) 81.1 - 15.5 (%)    Platelets 134 (*) 150 - 400 (K/uL)   COMPREHENSIVE METABOLIC PANEL     Status: Abnormal   Collection Time   10/22/11 12:06 AM      Component Value Range Comment   Sodium 125 (*) 135 - 145 (mEq/L)    Potassium 5.1  3.5 - 5.1 (mEq/L)    Chloride 92 (*) 96 - 112 (mEq/L)    CO2 18 (*) 19 - 32 (mEq/L)    Glucose, Bld 148 (*) 70 - 99 (mg/dL)    BUN 63 (*) 6 - 23 (mg/dL)    Creatinine, Ser 9.14 (*) 0.50 - 1.10 (mg/dL)    Calcium 8.7  8.4 - 10.5 (mg/dL)    Total Protein 6.8  6.0 - 8.3 (g/dL)    Albumin 2.7 (*) 3.5 - 5.2 (g/dL)    AST 33  0 - 37 (U/L)    ALT 24  0 - 35 (U/L)    Alkaline Phosphatase 165 (*) 39 - 117 (U/L)    Total Bilirubin 0.6  0.3 - 1.2 (mg/dL)    GFR calc non Af Amer 26 (*) >90 (mL/min)    GFR calc Af Amer 31 (*) >90 (mL/min)   GLUCOSE, CAPILLARY     Status: Abnormal   Collection Time   10/22/11 12:20 AM      Component Value Range Comment   Glucose-Capillary 153 (*) 70 - 99 (mg/dL)    Comment 1 Notify RN     TYPE AND SCREEN     Status: Normal   Collection Time   10/22/11  1:30 AM      Component Value Range Comment   ABO/RH(D) A POS      Antibody Screen NEG      Sample Expiration 10/25/2011     DIGOXIN LEVEL     Status: Normal   Collection Time   10/22/11  1:30 AM      Component Value Range Comment   Digoxin Level 0.8  0.8 - 2.0 (ng/mL)   OCCULT BLOOD, POC DEVICE     Status: Normal   Collection Time   10/22/11  1:46 AM      Component Value Range Comment   Fecal Occult Bld POSITIVE     GLUCOSE, CAPILLARY     Status: Abnormal    Collection Time   10/22/11  7:34 AM      Component Value Range Comment   Glucose-Capillary 177 (*) 70 - 99 (mg/dL)   OSMOLALITY     Status: Normal   Collection Time   10/22/11  7:50 AM      Component Value Range Comment   Osmolality 289  275 - 300 (mOsm/kg)   URINALYSIS, ROUTINE W REFLEX MICROSCOPIC     Status: Abnormal   Collection Time   10/22/11 11:44 AM      Component Value Range Comment   Color, Urine BROWN (*) YELLOW  BIOCHEMICALS MAY BE AFFECTED BY COLOR   APPearance TURBID (*) CLEAR     Specific Gravity, Urine 1.018  1.005 - 1.030     pH 5.5  5.0 - 8.0     Glucose, UA NEGATIVE  NEGATIVE (mg/dL)    Hgb urine dipstick LARGE (*) NEGATIVE     Bilirubin Urine NEGATIVE  NEGATIVE     Ketones, ur NEGATIVE  NEGATIVE (mg/dL)  Protein, ur 30 (*) NEGATIVE (mg/dL)    Urobilinogen, UA 0.2  0.0 - 1.0 (mg/dL)    Nitrite POSITIVE (*) NEGATIVE     Leukocytes, UA LARGE (*) NEGATIVE    URINE MICROSCOPIC-ADD ON     Status: Normal   Collection Time   10/22/11 11:44 AM      Component Value Range Comment   WBC, UA TOO NUMEROUS TO COUNT  <3 (WBC/hpf)    Urine-Other FIELD OBSCURED BY WBC'S     GLUCOSE, CAPILLARY     Status: Abnormal   Collection Time   10/22/11 12:16 PM      Component Value Range Comment   Glucose-Capillary 175 (*) 70 - 99 (mg/dL)     Ct Abdomen Pelvis Wo Contrast  10/22/2011  *RADIOLOGY REPORT*  Clinical Data: GI bleed.  CT ABDOMEN AND PELVIS WITHOUT CONTRAST  Technique:  Multidetector CT imaging of the abdomen and pelvis was performed following the standard protocol without intravenous contrast.  Comparison: None  Findings: The lung bases demonstrate bilateral pleural effusions and overlying atelectasis.  The heart is enlarged.  No pericardial effusion.  Extensive coronary artery calcifications are noted.  Findings worrisome for right breast cancer with an area of fairly significant skin thickening and subareolar density which could reflect a mass.  Recommend correlation with physical  examination, mammography and ultrasound is indicated.  The unenhanced appearance of the liver is unremarkable.  No focal lesions or biliary dilatation.  The gallbladder appears heterogeneous and may contain sludge.  No obvious common bile duct dilatation.  The pancreas is atrophied.  The spleen is normal in size.  The adrenal glands and kidneys are grossly normal.  Renal cysts are noted.  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 An IVC filter is in place.  The stomach, duodenum, small bowel and colon are grossly normal without oral contrast.  A left lower quadrant colostomy is noted.  There is a small to moderate amount of free pelvic fluid and there is diffuse body wall edema suggesting anasarca.  The bladder appears normal.  High attenuation material in the rectum is most likely blood.  IMPRESSION:  1.  Cannot exclude a right-sided breast cancer.  Recommend clinical correlation as discussed above. 2.  Bilateral pleural effusions with overlying atelectasis. 3.  High attenuation material in the rectum could reflect hemorrhage. 4.  Left lower quadrant colostomy. 5.  Small to moderate amount of free pelvic fluid. 6.  Diffuse body wall edema.  Original Report Authenticated By: P. Loralie Champagne, M.D.    Review of Systems  Unable to perform ROS: mental status change   Blood pressure 151/80, pulse 90, temperature 97.6 F (36.4 C), temperature source Oral, resp. rate 20, SpO2 100.00%. Physical Exam  Constitutional: No distress.       Elderly frail somnolent 78 y/o BF  HENT:  Head: Normocephalic and atraumatic.  Eyes: Conjunctivae are normal. Right eye exhibits no discharge. Left eye exhibits no discharge. No scleral icterus.  Neck: Normal range of motion. Neck supple. No JVD present. No tracheal deviation present. No thyromegaly present.  Cardiovascular: Normal rate and regular rhythm.  Exam reveals no gallop.   Murmur (II/VI SEM) heard. Respiratory: Effort normal. No stridor. No respiratory  distress. She has no wheezes. She has rales. She exhibits no tenderness.       RATE WAS ELEVATED.  GI: Soft. She exhibits no distension and no mass. There is no tenderness. There is no rebound and no guarding.  Colostomy LUQ with soft brown stool. Wound vac in place mid abdomen  Genitourinary:       Rectal exam shows old blood in rectal vault.  Musculoskeletal: She exhibits edema. She exhibits no tenderness.  Neurological:       Would not answer her daughter.  Slept thru exam and did not answer any questions.  Skin: Skin is warm and dry. She is not diaphoretic. No erythema.  Psychiatric:       Unresponsive.    Assessment/Plan: 1. Altered mental status, hyponatremia. 2. Urinary tract infection 3. History of sigmoid diverticular bleed with extended left sigmoid colectomy, Hartman's pouch, left transverse colon colostomy, 07/12/11, Dr. Ezzard Standing. New finding of old blood in rectum. 4. Coronary artery disease, history of MI, were current congestive heart failure/pulmonary edema with EF 37%. 5. Diabetes mellitus 6. COPD 7. History of CVA. 8. Peripheral vascular disease 9. Renal insufficiency  Plan: I saw her on with Dr. Elnoria Howard, who plans to do some cleansing enemas and endoscopy. We will follow with you recommend serial hemoglobin and hematocrits. We will ask the ostomy nurse to help with her colostomy and plan to change her wound VAC tomorrow when she is in room. For evaluation and treatment as needed.        Norrin Shreffler 10/22/2011, 2:03 PM

## 2011-10-22 NOTE — ED Provider Notes (Signed)
History     CSN: [id number]  Arrival date & time 10/21/11  2321   First MD Initiated Contact with Patient 10/22/11 0110      Chief Complaint  Patient presents with  . Rectal Bleeding    (Consider location/radiation/quality/duration/timing/severity/associated sxs/prior treatment) HPI Hx provided by patient and family bedside. Has colostomy for history of diverticulitis. Last week notice some bright red blood which resolved in tonight having blood in colostomy bag and per rectum. No trauma. No vomiting. No fevers. No abdominal pain. Moderate in severity. No known aggravating or alleviating factors. History of same. Symptoms intermittent. Past Medical History  Diagnosis Date  . Diabetes mellitus   . Hypertension   . Vascular disease   . Shortness of breath   . Bladder infection   . COPD (chronic obstructive pulmonary disease)   . Myocardial infarction   . Stroke   . GERD (gastroesophageal reflux disease)   . Diverticulitis     Past Surgical History  Procedure Date  . Bladder tack   . Femoral artery stent   . Abdominal hysterectomy   . Esophagogastroduodenoscopy 07/12/2011    Procedure: ESOPHAGOGASTRODUODENOSCOPY (EGD);  Surgeon: Theda Belfast, MD;  Location: [facility];  Service: Endoscopy;  Laterality: N/A;  bedside   . Flexible sigmoidoscopy 07/12/2011    Procedure: FLEXIBLE SIGMOIDOSCOPY;  Surgeon: Theda Belfast, MD;  Location: [facility];  Service: Endoscopy;  Laterality: N/A;  . Laparotomy 07/12/2011    Procedure: EXPLORATORY LAPAROTOMY;  Surgeon: Kandis Cocking, MD;  Location: [facility];  Service: General;  Laterality: N/A;  exploratory laparotomy, left sigmoid colectomy, creation of colostomy  . Abdominal surgery   . Colostomy   . Eye surgery     No family history on file.  History  Substance Use Topics  . Smoking status: Never Smoker   . Smokeless tobacco: Never Used  . Alcohol Use: No    OB History    Grav Para Term Preterm Abortions TAB SAB Ect Mult  Living                  Review of Systems  Constitutional: Negative for fever and chills.  HENT: Negative for neck pain and neck stiffness.   Eyes: Negative for pain.  Respiratory: Negative for shortness of breath.   Cardiovascular: Negative for chest pain.  Gastrointestinal: Positive for anal bleeding. Negative for vomiting and abdominal pain.  Genitourinary: Negative for dysuria.  Musculoskeletal: Negative for back pain.  Skin: Negative for rash.  Neurological: Negative for headaches.  All other systems reviewed and are negative.    Allergies  Avelox; Proton pump inhibitors; and Warfarin sodium  Home Medications   Current Outpatient Rx  Name Route Sig Dispense Refill  . ACETAMINOPHEN 500 MG PO TABS Oral Take 1,000 mg by mouth every 6 (six) hours as needed. For pain    . ALBUTEROL SULFATE (5 MG/ML) 0.5% IN NEBU Nebulization Take 0.5 mLs (2.5 mg total) by nebulization 3 (three) times daily. 20 mL 0  . IPRATROPIUM-ALBUTEROL 18-103 MCG/ACT IN AERO Inhalation Inhale 2 puffs into the lungs every 6 (six) hours as needed. For shortness of breath    . ALPRAZOLAM 0.25 MG PO TABS Oral Take 0.25 mg by mouth 3 (three) times daily as needed. anxiety    . VITAMIN C 100 MG PO TABS Oral Take 100 mg by mouth daily.    . ATORVASTATIN CALCIUM 20 MG PO TABS Oral Take 20 mg by mouth at bedtime.    . CEPHALEXIN  500 MG PO CAPS Oral Take 500 mg by mouth 2 (two) times daily. For 7 days; Start date unknown    . DIGOXIN 0.125 MG PO TABS Oral Take 125 mcg by mouth every other day.    Marland Kitchen FAMOTIDINE 20 MG PO TABS Oral Take 20 mg by mouth at bedtime.    . FUROSEMIDE 40 MG PO TABS Oral Take 1 tablet (40 mg total) by mouth daily. 30 tablet 0  . INSULIN DETEMIR 100 UNIT/ML Bath SOLN Subcutaneous Inject 0-15 Units into the skin See admin instructions. Patient on sliding scale in the morning and takes 15 units at bedtime    . IPRATROPIUM BROMIDE 0.02 % IN SOLN Nebulization Take 2.5 mLs (0.5 mg total) by nebulization  3 (three) times daily. 75 mL 0  . GERITOL COMPLETE PO Oral Take 2 tablets by mouth daily.    Marland Kitchen METOPROLOL TARTRATE 25 MG PO TABS Oral Take 12.5 mg by mouth 2 (two) times daily.    . MOMETASONE FUROATE 50 MCG/ACT NA SUSP Nasal Place 2 sprays into the nose at bedtime as needed. For congestion    . ICAPS PO CAPS Oral Take 1 capsule by mouth 2 (two) times daily.      BP 147/78  Pulse 84  Temp(Src) 97.4 F (36.3 C) (Oral)  Resp 21  SpO2 100%  Physical Exam  Constitutional: She appears well-developed and well-nourished.  HENT:  Head: Normocephalic and atraumatic.  Eyes: Conjunctivae and EOM are normal. Pupils are equal, round, and reactive to light.  Neck: Trachea normal. Neck supple. No thyromegaly present.  Cardiovascular: Normal rate, regular rhythm, S1 normal, S2 normal and normal pulses.     No systolic murmur is present   No diastolic murmur is present  Pulses:      Radial pulses are 2+ on the right side, and 2+ on the left side.  Pulmonary/Chest: Effort normal and breath sounds normal. She has no wheezes. She has no rhonchi. She has no rales. She exhibits no tenderness.  Abdominal: Soft. Normal appearance and bowel sounds are normal. There is no tenderness. There is no CVA tenderness and negative Murphy's sign.       Colostomy bag in place with stool that appears to have blood in it  Genitourinary:       Nontender no masses. bright red blood per rectum  Musculoskeletal:       BLE:s Calves nontender, no cords or erythema, negative Homans sign  Neurological: She is alert. She has normal strength. No cranial nerve deficit or sensory deficit. GCS eye subscore is 4. GCS verbal subscore is 5. GCS motor subscore is 6.  Skin: Skin is warm and dry. No rash noted. She is not diaphoretic.  Psychiatric: Her speech is normal.       Cooperative and appropriate    ED Course  Procedures (including critical care time)  Results for orders placed during the hospital encounter of 10/21/11    GLUCOSE, CAPILLARY      Component Value Range   Glucose-Capillary 153 (*) 70 - 99 (mg/dL)   Comment 1 Notify RN    CBC      Component Value Range   WBC 9.2  4.0 - 10.5 (K/uL)   RBC 5.05  3.87 - 5.11 (MIL/uL)   Hemoglobin 13.6  12.0 - 15.0 (g/dL)   HCT 16.1  09.6 - 04.5 (%)   MCV 86.7  78.0 - 100.0 (fL)   MCH 26.9  26.0 - 34.0 (pg)   MCHC  31.1  30.0 - 36.0 (g/dL)   RDW 30.8 (*) 65.7 - 15.5 (%)   Platelets 134 (*) 150 - 400 (K/uL)  COMPREHENSIVE METABOLIC PANEL      Component Value Range   Sodium 125 (*) 135 - 145 (mEq/L)   Potassium 5.1  3.5 - 5.1 (mEq/L)   Chloride 92 (*) 96 - 112 (mEq/L)   CO2 18 (*) 19 - 32 (mEq/L)   Glucose, Bld 148 (*) 70 - 99 (mg/dL)   BUN 63 (*) 6 - 23 (mg/dL)   Creatinine, Ser 8.46 (*) 0.50 - 1.10 (mg/dL)   Calcium 8.7  8.4 - 96.2 (mg/dL)   Total Protein 6.8  6.0 - 8.3 (g/dL)   Albumin 2.7 (*) 3.5 - 5.2 (g/dL)   AST 33  0 - 37 (U/L)   ALT 24  0 - 35 (U/L)   Alkaline Phosphatase 165 (*) 39 - 117 (U/L)   Total Bilirubin 0.6  0.3 - 1.2 (mg/dL)   GFR calc non Af Amer 26 (*) >90 (mL/min)   GFR calc Af Amer 31 (*) >90 (mL/min)  OCCULT BLOOD, POC DEVICE      Component Value Range   Fecal Occult Bld POSITIVE     Dg Chest 2 View  09/22/2011  *RADIOLOGY REPORT*  Clinical Data: Weakness, shortness of breath  CHEST - 2 VIEW  Comparison: Portable chest x-ray of 09/14/2011  Findings: The lungs are poorly aerated with basilar atelectasis and possible mild pulmonary vascular congestion.  Cardiomegaly is stable.  There are degenerative changes throughout the thoracic spine.  IMPRESSION: Little change in poor aeration with cardiomegaly and probable pulmonary vascular congestion.  Original Report Authenticated By: Juline Patch, M.D.   Dg Cervical Spine Complete  10/02/2011  *RADIOLOGY REPORT*  Clinical Data: Left neck pain status post fall.  CERVICAL SPINE - COMPLETE 4+ VIEW  Comparison: None.  Findings: Advanced multilevel degenerative changes with multilevel facet  arthropathy and degenerative disc disease most pronounced at C5-6.  There is mild vertebral body height loss at C5. Otherwise maintained vertebral body height and alignment.  No prevertebral soft tissue swelling.  Maintained cranial cervical and C1-2 articulation, though the odontoid view is slightly limited by rotation.  Lung apices are clear.  Atherosclerotic calcification of the aortic arch.  IMPRESSION: Advanced multilevel degenerative changes.  Mild vertebral body height loss at C5 is age indeterminate though favored remote. Correlate with point tenderness.  Original Report Authenticated By: Waneta Martins, M.D.   Dg Lumbar Spine Complete  10/02/2011  *RADIOLOGY REPORT*  Clinical Data: Fall 1 day ago.  Left-sided pain radiating to lateral hip.  LUMBAR SPINE - COMPLETE 4+ VIEW  Comparison: Pelvis radiograph 10/02/2011 and lumbar spine radiographs 09/13/2011  Findings: Lumbar spine vertebral bodies are in stable alignment. Grade 1 anterolisthesis at L3 on L4 is unchanged.  The remainder of the lumbar spine is aligned.  Marked degenerative change with near complete loss of the disc space at L4-5 is stable.  Facet joint degenerative changes throughout the lumbar spine.  No evidence of fracture.  IVC filter projects to the right of midline, and stable position.  IMPRESSION: Stable chronic degenerative changes.  No acute bony abnormality.  Original Report Authenticated By: Britta Mccreedy, M.D.   Dg Pelvis 1-2 Views  10/02/2011  *RADIOLOGY REPORT*  Clinical Data: Status post fall 1 day ago.  Left-sided pain radiating to left.  PELVIS - 1-2 VIEW  Comparison: .  Pelvis radiograph 07/07/2011  Findings: Both femoral heads project over  the acetabulae.  No fracture or diastasis is identified. Sacroiliac joints appear normal.  The bones appear osteopenic.  IMPRESSION: No acute bony abnormality identified.  Osteopenia.  Original Report Authenticated By: Britta Mccreedy, M.D.   Ct Head Wo Contrast  10/02/2011   *RADIOLOGY REPORT*  Clinical Data: History of fall complaining of head pain.  CT HEAD WITHOUT CONTRAST  Technique:  Contiguous axial images were obtained from the base of the skull through the vertex without contrast.  Comparison: CT head 06/04/2009.  Findings: No acute displaced skull fractures are identified.  There is mild cerebral and cerebellar atrophy.  There are several well- defined foci of decreased attenuation in the cerebellar hemispheres bilaterally, compatible with old lacunar infarctions. Similarly, there is an old lacunar infarction in the left external capsule, and left thalamus (image 13 of series 2).  Additionally, there are extensive patchy and confluent areas of decreased attenuation throughout the deep and periventricular white matter of the cerebral hemispheres bilaterally, compatible with chronic microvascular ischemic changes.  No definite acute intracranial abnormalities.  Specifically, no definite evidence of acute post- traumatic intracranial hemorrhage, no overt signs of acute/subacute ischemia (accurate assessment for this upon this background of chronic microvascular ischemic changes as it is exceedingly difficult), no focal mass, mass effect, hydrocephalus or abnormal intra or extra-axial fluid collections. Visualized paranasal sinuses and mastoids are well pneumatized. Scleral calcifications bilaterally, and increased density in the vitreous of the right globe suggesting prior vitreous hemorrhage (similar to prior).  IMPRESSION: 1.  No acute displaced skull fracture or evidence of significant acute intracranial trauma. 2.  Mild cerebral and cerebellar atrophy with old lacunar infarctions in the left external capsule, left thalamus and bilateral cerebellar hemispheres, as well as chronic microvascular ischemic changes throughout the white matter of the cerebral hemispheres bilaterally.  Original Report Authenticated By: Florencia Reasons, M.D.   2:39 AM is discussed with triad  hospitalist on-call, plan medical admission.   MDM   Rectal bleeding and 78 year old female history of diverticulosis diverticulitis recent colectomy with ostomy in place. Old records reviewed. Nursing notes reviewed. Vital signs reviewed. Labs and imaging reviewed as above.        Sara Nielsen, MD 10/22/11 [phone]

## 2011-10-22 NOTE — Care Management Note (Unsigned)
    Page 1 of 1   10/22/2011     4:13:37 PM   CARE MANAGEMENT NOTE 10/22/2011  Patient:  [name], [name]   Account Number:  [id number]  Date Initiated:  10/22/2011  Documentation initiated by:  Lanier Clam  Subjective/Objective Assessment:   ADMITTED W/GIB.READMIT-5/6-CHF.     Action/Plan:   FROM HOME W/FAMILY.ACTIVE W/[facility].   Anticipated DC Date:  10/28/2011   Anticipated DC Plan:  HOME W HOME HEALTH SERVICES      DC Planning Services  CM consult      Choice offered to / List presented to:             Status of service:  In process, will continue to follow Medicare Important Message given?   (If response is "NO", the following Medicare IM given date fields will be blank) Date Medicare IM given:   Date Additional Medicare IM given:    Discharge Disposition:    Per UR Regulation:  Reviewed for med. necessity/level of care/duration of stay  If discussed at Long Length of Stay Meetings, dates discussed:    Comments:  10/22/11 Sara Prochazka RN,BSN NCM [phone] [facility] SUSAN([facility])CONTACTED & FOLLOWING FOR HH.

## 2011-10-22 NOTE — H&P (Signed)
PCP:   Laurena Slimmer, MD, MD  Cardiology: Wynelle Fanny  Chief Complaint:   Black blood in stool  HPI: [name] is a 78 y.o. female   has a past medical history of Diabetes mellitus; Hypertension; Vascular disease; Shortness of breath; Bladder infection; COPD (chronic obstructive pulmonary disease); Myocardial infarction; Stroke; GERD (gastroesophageal reflux disease); Diverticulitis; and CHF (congestive heart failure).   Presented with  Today family noted old blood like stool that was Jelly like. She has had that 1 week ago the same problem but it resolved. Patient is status post colostomy secondary to colon resection for life-threatening GI bleeding in February. For the last few days she has been somnolent. She has been weaker. Less PO intake. For the past 24 hour she have not been speaking as much. NO fever no chills. She have had episodes of hypotension at home. Lately she have had generalized swelling for a weak not improved with Lasix. Albumin is 2.7. Patient appears to be stents in space does not respond to verbal stimuli .  At baseline she is able to transfer although since separation in February she had been mainly bed bound. For the past few days she is unable to do anything for herself and unable to get up. Review of Systems:  Unable to obtain  Past Medical History: Past Medical History  Diagnosis Date  . Diabetes mellitus   . Hypertension   . Vascular disease   . Shortness of breath   . Bladder infection   . COPD (chronic obstructive pulmonary disease)   . Myocardial infarction   . Stroke   . GERD (gastroesophageal reflux disease)   . Diverticulitis   . CHF (congestive heart failure)    Past Surgical History  Procedure Date  . Bladder tack   . Femoral artery stent   . Abdominal hysterectomy   . Esophagogastroduodenoscopy 07/12/2011    Procedure: ESOPHAGOGASTRODUODENOSCOPY (EGD);  Surgeon: Theda Belfast, MD;  Location: [facility];  Service: Endoscopy;   Laterality: N/A;  bedside   . Flexible sigmoidoscopy 07/12/2011    Procedure: FLEXIBLE SIGMOIDOSCOPY;  Surgeon: Theda Belfast, MD;  Location: [facility];  Service: Endoscopy;  Laterality: N/A;  . Laparotomy 07/12/2011    Procedure: EXPLORATORY LAPAROTOMY;  Surgeon: Kandis Cocking, MD;  Location: [facility];  Service: General;  Laterality: N/A;  exploratory laparotomy, left sigmoid colectomy, creation of colostomy  . Abdominal surgery   . Colostomy   . Eye surgery   . Ivc filter      Medications: Prior to Admission medications   Medication Sig Start Date End Date Taking? Authorizing Provider  acetaminophen (TYLENOL) 500 MG tablet Take 1,000 mg by mouth every 6 (six) hours as needed. For pain    Historical Provider, MD  albuterol (PROVENTIL) (5 MG/ML) 0.5% nebulizer solution Take 0.5 mLs (2.5 mg total) by nebulization 3 (three) times daily. 09/15/11 09/14/12  Gwenyth Bender, NP  albuterol-ipratropium (COMBIVENT) 18-103 MCG/ACT inhaler Inhale 2 puffs into the lungs every 6 (six) hours as needed. For shortness of breath    Historical Provider, MD  ALPRAZolam (XANAX) 0.25 MG tablet Take 0.25 mg by mouth 3 (three) times daily as needed. anxiety    Historical Provider, MD  Ascorbic Acid (VITAMIN C) 100 MG tablet Take 100 mg by mouth daily.    Historical Provider, MD  atorvastatin (LIPITOR) 20 MG tablet Take 20 mg by mouth at bedtime.    Historical Provider, MD  digoxin ([name]) 0.125 MG tablet Take 125  mcg by mouth every other day.    Historical Provider, MD  famotidine (PEPCID) 20 MG tablet Take 20 mg by mouth at bedtime.    Historical Provider, MD  furosemide (LASIX) 40 MG tablet Take 80 mg by mouth 2 (two) times daily. 09/15/11   Gwenyth Bender, NP  insulin detemir (LEVEMIR) 100 UNIT/ML injection Inject 0-15 Units into the skin See admin instructions. Patient on sliding scale in the morning and takes 15 units at bedtime    Historical Provider, MD  ipratropium (ATROVENT) 0.02 % nebulizer solution Take  2.5 mLs (0.5 mg total) by nebulization 3 (three) times daily. 09/15/11 09/14/12  Gwenyth Bender, NP  Iron-Vitamins (GERITOL COMPLETE PO) Take 2 tablets by mouth daily.    Historical Provider, MD  metoprolol tartrate (LOPRESSOR) 25 MG tablet Take 12.5 mg by mouth 2 (two) times daily.    Historical Provider, MD  mometasone (NASONEX) 50 MCG/ACT nasal spray Place 2 sprays into the nose at bedtime as needed. For congestion    Historical Provider, MD  Multiple Vitamins-Minerals (ICAPS) CAPS Take 1 capsule by mouth 2 (two) times daily.    Historical Provider, MD    Allergies:   Allergies  Allergen Reactions  . Avelox (Moxifloxacin Hcl In Nacl) Other (See Comments)    unknown  . Proton Pump Inhibitors Other (See Comments)    Thrombocytopenia  . Warfarin Sodium Other (See Comments)    Causes severe bleeding    Social History:  Ambulatory bed bound since Feb Lives at  Home with family   reports that she has quit smoking. She has never used smokeless tobacco. She reports that she does not drink alcohol or use illicit drugs.   Family History: family history is not on file. noncontributory  Physical Exam: Patient Vitals for the past 24 hrs:  BP Temp Temp src Pulse Resp SpO2  10/22/11 0011 147/78 mmHg - - 84  21  100 %  10/21/11 2346 154/88 mmHg 97.4 F (36.3 C) Oral 83  19  100 %    1. General:  in No Acute distress 2. Psychological: Alert and not Oriented 3. Head/ENT:    Dry Mucous Membranes                          Head Non traumatic, neck supple                          Normal Dentition 4. SKIN: normal Skin turgor,  Skin clean Dry and no rash, small wound present on the left foot, wound VAC present on abdominal surgical site 5. Heart: Regular rate and rhythm no Murmur, Rub or gallop 6. Lungs: Difficult to examine patient appears to breathing with her mouth open   7. Abdomen: Soft, non-tender, Non distended colostomy present with orange stool no blood in the back she is Hemoccult  positive from below 8. Lower extremities: no clubbing, cyanosis, generalized edema anasarca present 9. Neurologically Grossly intact, patient not following commands 10. MSK: Normal range of motion  body mass index is unknown because there is no height or weight on file.   Labs on Admission:   [facility] 10/22/11 0006  NA 125*  K 5.1  CL 92*  CO2 18*  GLUCOSE 148*  BUN 63*  CREATININE 1.71*  CALCIUM 8.7  MG --  PHOS --    Basename 10/22/11 0006  AST 33  ALT 24  ALKPHOS 165*  BILITOT 0.6  PROT 6.8  ALBUMIN 2.7*   No results found for this basename: LIPASE:2,AMYLASE:2 in the last 72 hours  Basename 10/22/11 0006  WBC 9.2  NEUTROABS --  HGB 13.6  HCT 43.8  MCV 86.7  PLT 134*   No results found for this basename: CKTOTAL:3,CKMB:3,CKMBINDEX:3,TROPONINI:3 in the last 72 hours No results found for this basename: TSH,T4TOTAL,FREET3,T3FREE,THYROIDAB in the last 72 hours No results found for this basename: VITAMINB12:2,FOLATE:2,FERRITIN:2,TIBC:2,IRON:2,RETICCTPCT:2 in the last 72 hours Lab Results  Component Value Date   HGBA1C 7.6* 09/21/2011    The CrCl is unknown because both a height and weight (above a minimum accepted value) are required for this calculation. ABG    Component Value Date/Time   PHART 7.438* 06/20/2011 1320   HCO3 23.2 06/20/2011 1320   TCO2 21.5 06/20/2011 1320   ACIDBASEDEF 0.2 06/20/2011 1320   O2SAT 88.7 06/20/2011 1320     Lab Results  Component Value Date   DDIMER 2.48* 06/25/2011     Cultures:    Component Value Date/Time   SDES URINE, CLEAN CATCH 09/26/2011 1112   SPECREQUEST NONE 09/26/2011 1112   CULT NO GROWTH 09/26/2011 1112   REPTSTATUS 09/27/2011 FINAL 09/26/2011 1112       Radiological Exams on Admission: No results found.  Assessment/Plan  This is this 79 year old female with history of diverticulitis and GI bleed status post colostomy who presents now with small amounts of blood per rectum. Diffuse fatigue appear to be failure to  thrive anasarca and worsening renal function.  Present on Admission:  .GI bleed - this appears to be mild likely from rectal pouch. Would recommend GI consult in a.m. monitor her CBC  .Diverticulosis of colon with hemorrhage s/p left colectomy and colostomy 07/11/11 - monitor CBC speak to GI in the morning  .Diabetes mellitus - sliding scale  .Acute renal failure - patient seemed to be intravascularly depleted but with peripheral edema likely secondary to hypoalbuminemia .hold off on Lasix for now. Obtain chest x-ray to evaluate for any pulmonary congestion she is chronically at 2 L of oxygen and currently satting well  .Acute combined systolic and diastolic congestive heart failure - patient had an echogram done in the beginning of May EF 30-35%. Would avoid fluid overload.  .Thrombocytopenia - chronic continue to monitor  .Hyponatremia - patient is dry mucous membranes and had had decreased by mouth intake there is generalized anasarca but I suspect this is secondary to low albumin rather than pure fluid overload. We'll see if her sodium improves if he hold off on her Lasix. We'll obtain urine electrolytes  .Hypertension - continue home medications   Prophylaxis: SCD  Protonix  CODE STATUS: DNR/DNI as per family  I have spent a total of 65 min on this admission  Arrick Dutton 10/22/2011, 2:50 AM

## 2011-10-22 NOTE — ED Notes (Signed)
Called to give report, Amy, receiving RN on lunch.

## 2011-10-22 NOTE — Progress Notes (Addendum)
Patient seen and examined in the ED, admitted by Dr. Kara Pacer this morning. Briefly 78 year old female with history of diabetes, hypertension, vascular disease, history of life-threatening diverticular GI bleeding in February 2013 who underwent colon resection and colostomy by Dr. Ezzard Standing. She still has a wound VAC, and had a followup appointment with Dr. Ezzard Standing tomorrow in the office. Patient presented to ED with small rectal bleeding but no bleeding in the colostomy, increasing fatigue and generalized weakness, acute renal failure. UA also positive for UTI - Continue gentle hydration (avoid any for fluid overload due to history of systolic CHF, EF of 37%) - Obtain urine culture, placed on Rocephin IV - GI consult obtained, discussed with Dr.Hung this morning, will follow patient - I also called [facility]/general surgery consultation for evaluation - Wound care consult for the wound VAC, PT eval   Stanley Lyness M.D. [facility] 10/22/2011, 12:47 PM  Pager: [phone]   Addendum: - Reviewed CT abdomen and pelvis also shows-  IMPRESSION:  1. Cannot exclude a right-sided breast cancer. Recommend clinical  correlation as discussed above.  2. Bilateral pleural effusions with overlying atelectasis.  3. High attenuation material in the rectum could reflect  hemorrhage.  4. Left lower quadrant colostomy.  5. Small to moderate amount of free pelvic fluid.  6. Diffuse body wall edema  On clinical exam, the patient does not have any areolar skin thickening or palpable mass on right breast. [name] daughter, patient had lumpectomy done on rt breast, and biopsy was negative. Patient has yearly mammograms which has been negative so far but she has missed last year's mammogram. I did order right breast ultrasound however per radiology the mammograms and breast ultrasounds [facility] at [facility] ([address] [place], [place] [address], [phone]). Explained to the patient and her daughter to see PCP  and coordinate mammogram and right breast US out-patient.   Mayar Whittier M.D. [facility] 10/22/2011, 4:48 PM  Pager: [phone]

## 2011-10-22 NOTE — ED Notes (Signed)
Called to give report and nurse from floor stated that pt was inappropriate for icu/stepdown floor and dr will switch pt to telemetry.   Order placed by Dr. Isidoro Donning.

## 2011-10-23 ENCOUNTER — Ambulatory Visit (INDEPENDENT_AMBULATORY_CARE_PROVIDER_SITE_OTHER): Payer: PRIVATE HEALTH INSURANCE | Admitting: Surgery

## 2011-10-23 ENCOUNTER — Encounter (HOSPITAL_COMMUNITY): Payer: Self-pay | Admitting: *Deleted

## 2011-10-23 ENCOUNTER — Encounter (HOSPITAL_COMMUNITY)
Admission: EM | Disposition: A | Discharge status: Home / Self Care | Payer: Self-pay | Source: Home / Self Care | Attending: Internal Medicine

## 2011-10-23 DIAGNOSIS — K921 Melena: Secondary | ICD-10-CM

## 2011-10-23 DIAGNOSIS — E1165 Type 2 diabetes mellitus with hyperglycemia: Secondary | ICD-10-CM

## 2011-10-23 DIAGNOSIS — R609 Edema, unspecified: Secondary | ICD-10-CM

## 2011-10-23 DIAGNOSIS — E871 Hypo-osmolality and hyponatremia: Secondary | ICD-10-CM

## 2011-10-23 HISTORY — PX: FLEXIBLE SIGMOIDOSCOPY: SHX5431

## 2011-10-23 LAB — CBC
MCHC: 32.1 g/dL (ref 30.0–36.0)
MCV: 86.4 fL (ref 78.0–100.0)
Platelets: 126 10*3/uL — ABNORMAL LOW (ref 150–400)
RDW: 21.7 % — ABNORMAL HIGH (ref 11.5–15.5)
WBC: 7 10*3/uL (ref 4.0–10.5)

## 2011-10-23 LAB — BASIC METABOLIC PANEL
Chloride: 99 mEq/L (ref 96–112)
Creatinine, Ser: 1.48 mg/dL — ABNORMAL HIGH (ref 0.50–1.10)
GFR calc Af Amer: 37 mL/min — ABNORMAL LOW (ref >90)
GFR calc non Af Amer: 32 mL/min — ABNORMAL LOW (ref >90)

## 2011-10-23 LAB — CARDIAC PANEL(CRET KIN+CKTOT+MB+TROPI)
CK, MB: 4 ng/mL (ref 0.3–4.0)
Relative Index: INVALID (ref 0.0–2.5)
Total CK: 52 U/L (ref 7–177)

## 2011-10-23 LAB — MAGNESIUM: Magnesium: 2.1 mg/dL (ref 1.5–2.5)

## 2011-10-23 LAB — PHOSPHORUS: Phosphorus: 4.6 mg/dL (ref 2.3–4.6)

## 2011-10-23 SURGERY — SIGMOIDOSCOPY, FLEXIBLE
Anesthesia: Moderate Sedation

## 2011-10-23 MED ORDER — FUROSEMIDE 10 MG/ML IJ SOLN
40.0000 mg | Freq: Once | INTRAMUSCULAR | Status: AC
  Administered 2011-10-23: 40 mg via INTRAVENOUS
  Filled 2011-10-23: qty 4

## 2011-10-23 MED ORDER — CEPHALEXIN 500 MG PO CAPS: 500.0000 mg | ORAL_CAPSULE | Freq: Two times a day (BID) | ORAL | Status: DC

## 2011-10-23 MED ORDER — FUROSEMIDE 20 MG PO TABS: 20.0000 mg | ORAL_TABLET | Freq: Every day | ORAL | Status: DC

## 2011-10-23 NOTE — Clinical Documentation Improvement (Signed)
CHANGE MENTAL STATUS DOCUMENTATION CLARIFICATION   THIS DOCUMENT IS NOT A PERMANENT PART OF THE MEDICAL RECORD  TO RESPOND TO THE THIS QUERY, FOLLOW THE INSTRUCTIONS BELOW:  1. If needed, update documentation for the patient's encounter via the notes activity.  2. Access this query again and click edit on the In [employer].  3. After updating, or not, click F2 to complete all highlighted (required) fields concerning your review. Select "additional documentation in the medical record" OR "no additional documentation provided".  4. Click Sign note button.  5. The deficiency will fall out of your In Basket *Please let us know if you are not able to complete this workflow by phone or e-mail (listed below).         10/23/11  Dear Dr. [facility]  In an effort to better capture your patient's severity of illness, reflect appropriate length of stay and utilization of resources, a review of the patient medical record has revealed the following indicators.    Based on your clinical judgment, please clarify and document in a progress note and/or discharge summary the clinical condition associated with the following supporting information:  In responding to this query please exercise your independent judgment.  The fact that a query is asked, does not imply that any particular answer is desired or expected.  Noted per 10/22/11 Cons Note & h/p..." Altered mental status, hyponatremia".Marland KitchenMarland KitchenFor accurate Dx specificity & severity can noted "change in MS" be further clarified w/ cond being eval/ mon & tx/d. Thank you  Possible Clinical Conditions?  Encephalopathy (describe type if known) Anoxic Septic  Alcoholic  Hepatic Hypertensive Metabolic Toxic  Drug induced confusion/delirium Acute confusion Acute delirium Acute exacerbation of known dementia (indicate type)  New diagnosis of Dementia, Alzheimer's, cerebral atherosclerosis  Hyponatremia / Hypernatremia Poisoning /  Overdose Hypoxemia / Hypoxia  Other Condition Cannot Clinically Determine   Supporting Information: Risk Factors: See 10/21/11 H&P"  Signs & Symptoms: 10/21/11 H&P.Marland KitchenMarland Kitchen"For the last few days she has been somnolent. She has been weaker. Less PO intake.. had episodes of hypotension at home... appears to be stents in space does not respond to verbal stimuli.".Marland KitchenMarland Kitchen  Diagnostics: Lab: 10/22/11 0006  NA 125*  ALBUMIN 2.7*   Radiology:  Treatment: 10/22/11: Trend labs, iv fluids, meds adjusted, cont to monitor   Reviewed:  no additional documentation provided, cannot clinically determine  Thank You,  Toribio Harbour, RN, BSN, CCDS Certified Clinical Documentation Specialist Pager: [phone]  Health Information Management [place]

## 2011-10-23 NOTE — Consult Note (Addendum)
WOC ostomy consult  Stoma type/location: LLQ colostomy Stomal assessment/size: 1 and 3/4 inch pink, budded stoma with os at dead center Peristomal assessment: intact, clear Treatment options for stomal/peristomal skin: None indicated Output none in pouch at this time Ostomy pouching: 1pc. Alba Destine [phone] Education provided: Daughters are independent in her ostomy care.  Pouches provided to bedside for their or the staff's use.  No needs verbalized and they have my contact information from last admission.  WOC consult Note Reason for Consult:wound (midline)  Wound type:surgical healing by secondary intention Pressure Ulcer POA: No Wound bed:  Clean, pink, granulating (observed by Zola Button, PA earlier today) and contracting Drainage (amount, consistency, odor) scant serous Periwound:intact Dressing procedure/placement/frequency:Had been managed by NPWT; I feel that this modality may no longer be needed as defect has closed significantly and this could also be managed by twice daily saline dressings. I defer to the expertise of the physicians on further treatment. Following consultation with Dr. Johna Sheriff, he decided to discontinue NPWT in favor of twice daily NS dressings.   Those orders are implemented on his behalf.  I will not follow.  Please re-consult if needed. Thanks, Ladona Mow, MSN, RN, [facility], CWOCN [phone])

## 2011-10-23 NOTE — Evaluation (Signed)
Physical Therapy Evaluation Patient Details Name: [name] MRN: [id number] DOB: 13-Nov-1925 Today's Date: 10/23/2011 Time: 8295-6213 PT Time Calculation (min): 34 min  PT Assessment / Plan / Recommendation Clinical Impression  Patient is an 78 yo female admitted with GI bleed.  Patient currently being cared for by 2 daughters at one of daughter's home.  They plan to continue this arrangement at discharge.  They have equipment needed.  Would recommend HHPT at discharge.      PT Assessment  Patient needs continued PT services    Follow Up Recommendations  Home health PT;Supervision/Assistance - 24 hour    Barriers to Discharge None      lEquipment Recommendations  None recommended by PT    Recommendations for Other Services     Frequency Min 3X/week    Precautions / Restrictions Precautions Precautions: Fall Precaution Comments: Patient with fair to poor sitting balance. Restrictions Weight Bearing Restrictions: No       Mobility  Bed Mobility Bed Mobility: Rolling Left Rolling Left: 1: +2 Total assist Rolling Left: Patient Percentage: 10% Left Sidelying to Sit: 1: +2 Total assist Left Sidelying to Sit: Patient Percentage: 10% Details for Bed Mobility Assistance: Patient require physical and verbal cuing for technique.  Sat on EOB x 7 minutes working on static sitting balance and UE activities with OT.    Exercises     PT Diagnosis: Difficulty walking;Generalized weakness;Hemiplegia non-dominant side;Altered mental status  PT Problem List: Decreased strength;Decreased activity tolerance;Decreased balance;Decreased mobility;Decreased cognition;Decreased safety awareness PT Treatment Interventions: DME instruction;Functional mobility training;Therapeutic activities;Balance training;Patient/family education   PT Goals Acute Rehab PT Goals PT Goal Formulation: With patient/family Time For Goal Achievement: 11/06/11 Potential to Achieve Goals: Fair Pt will Roll Supine  to Left Side: with mod assist PT Goal: Rolling Supine to Left Side - Progress: Goal set today Pt will go Supine/Side to Sit: with mod assist PT Goal: Supine/Side to Sit - Progress: Goal set today Pt will Sit at Edge of Bed: with min assist;6-10 min;with unilateral upper extremity support Pt will go Sit to Supine/Side: with min assist PT Goal: Sit to Supine/Side - Progress: Goal set today Pt will Transfer Bed to Chair/Chair to Bed: with mod assist PT Transfer Goal: Bed to Chair/Chair to Bed - Progress: Goal set today  Visit Information  Last PT Received On: 10/23/11 Assistance Needed: +2 PT/OT Co-Evaluation/Treatment: Yes    Subjective Data  Subjective: "I'm OK."  Short statements Patient Stated Goal: Daughter - to return home.  To get stronger   Prior Functioning  Home Living Lives With: Daughter (2 daughters:  one day, one night) Available Help at Discharge: Family Type of Home: House Home Access: Stairs to enter [employer] of Steps: 1 + 1 + 2 + 1 Entrance Stairs-Rails: Right Home Layout: One level Bathroom Shower/Tub: Engineer, manufacturing systems: Handicapped height Home Adaptive Equipment: Bedside commode/3-in-1;Tub transfer bench;Walker - four wheeled;Walker - rolling;Wheelchair - manual;Other (comment) (bed rail) Prior Function Level of Independence: Needs assistance (help since surgery in February) Needs Assistance: Bathing;Dressing;Toileting;Transfers;Gait Able to Take Stairs?: No Driving: No Comments: Patient able to ambulate and perform ADL's on own prior to February this year.  Decline since that surgery/hospitalization. Communication Communication: Other (comment) (very quiet, some verbalizations) Dominant Hand: Left (CVA affected R)    Cognition  Overall Cognitive Status: History of cognitive impairments - at baseline Arousal/Alertness: Lethargic Orientation Level: Disoriented to;Place;Time;Situation Behavior During Session: Lethargic Cognition  - Other Comments: slow processing, only oriented to self.  Extremity/Trunk Assessment Right Upper Extremity Assessment RUE ROM/Strength/Tone: Deficits RUE ROM/Strength/Tone Deficits: Pt is able to lift Bil UE's to 90.  hand has edema and weak grasp is present.  Unable to hold lotion bottle Left Upper Extremity Assessment LUE ROM/Strength/Tone: [facility] for tasks assessed (to 90 degrees shoulder.  Overall 3- to 3/5 strength) Right Lower Extremity Assessment RLE ROM/Strength/Tone: Deficits RLE ROM/Strength/Tone Deficits: Strength grossly 3-/5. Left Lower Extremity Assessment LLE ROM/Strength/Tone: [facility] for tasks assessed Trunk Assessment Trunk Assessment:  (decreased trunk strength.  overall min to mod A for support)   Balance Balance Balance Assessed: Yes Static Sitting Balance Static Sitting - Balance Support: Right upper extremity supported;Feet supported Static Sitting - Level of Assistance: 3: Mod assist Static Sitting - Comment/# of Minutes: Patient sat EOB x 7 minutes.  Physical and verbal cues to move trunk forward over hips.  Tending to lean posteriorly with trunk flexed and pelvis posteriorly tilted.  End of Session PT - End of Session Activity Tolerance: Patient limited by fatigue Patient left: in bed;with family/visitor present;with call bell/phone within reach Nurse Communication: Mobility status   Vena Austria 10/23/2011, 11:36 AM Durenda Hurt. Renaldo Fiddler, [facility] Acute Rehab Services Pager [phone]

## 2011-10-23 NOTE — H&P (View-Only) (Signed)
Patient seen and examined in the ED, admitted by Dr. Duotova this morning. Briefly 78-year-old female with history of diabetes, hypertension, vascular disease, history of life-threatening diverticular GI bleeding in February 2013 who underwent colon resection and colostomy by Dr. Newman. She still has a wound VAC, and had a followup appointment with Dr. Newman tomorrow in the office. Patient presented to ED with small rectal bleeding but no bleeding in the colostomy, increasing fatigue and generalized weakness, acute renal failure. UA also positive for UTI - Continue gentle hydration (avoid any for fluid overload due to history of systolic CHF, EF of 37%) - Obtain urine culture, placed on Rocephin IV - GI consult obtained, discussed with Dr.Hung this morning, will follow patient - I also called [facility]/general surgery consultation for evaluation - Wound care consult for the wound VAC, PT eval   Shandel Busic M.D. [facility] 10/22/2011, 12:47 PM  Pager: [phone]   Addendum: - Reviewed CT abdomen and pelvis also shows-  IMPRESSION:  1. Cannot exclude a right-sided breast cancer. Recommend clinical  correlation as discussed above.  2. Bilateral pleural effusions with overlying atelectasis.  3. High attenuation material in the rectum could reflect  hemorrhage.  4. Left lower quadrant colostomy.  5. Small to moderate amount of free pelvic fluid.  6. Diffuse body wall edema  On clinical exam, the patient does not have any areolar skin thickening or palpable mass on right breast. [name] daughter, patient had lumpectomy done on rt breast, and biopsy was negative. Patient has yearly mammograms which has been negative so far but she has missed last year's mammogram. I did order right breast ultrasound however per radiology the mammograms and breast ultrasounds are done only at [facility] ([address] , [place] [address], [phone]). Explained to the patient and her daughter to see PCP  and coordinate mammogram and right breast US out-patient.   Perrie Ragin M.D. [facility] 10/22/2011, 4:48 PM  Pager: [phone]       

## 2011-10-23 NOTE — Evaluation (Signed)
Occupational Therapy Evaluation Patient Details Name: MARLEENA SHUBERT MRN: [id number] DOB: [birth year] Today's Date: 10/23/2011 Time: 7829-5621 OT Time Calculation (min): 32 min  OT Assessment / Plan / Recommendation Clinical Impression  This 78 year old female was admitted with GI Bleed.  Pt has a h/o CVA additionally,  she had hemicolectomy in February and has needed A from family since then, and family reports that pt is weaker now than before.  Will follow pt in acute to decrease burden of care.  Will likely not recommend HHOT unless pt progresses significantly in acute.    OT Assessment  Patient needs continued OT Services    Follow Up Recommendations  No OT follow up;Other (comment) (unless pt improves significantly and will benefit.  Family has been doing exercises and ADLs with pt at home)    Barriers to Discharge      Equipment Recommendations  Other (comment) (family wants lift chair: will further assess lift equipment, if needed)    Recommendations for Other Services    Frequency  Min 1X/week    Precautions / Restrictions Precautions Precautions: Fall Restrictions Weight Bearing Restrictions: No       ADL  Grooming: Performed;Wash/dry hands;+1 Total assistance Where Assessed - Grooming: Unsupported sitting (min to mod A for trunk when sitting EOB) Transfers/Ambulation Related to ADLs: cotx with PT. Only sat EOB and trunk support was needed.  Pt did begin to self correct.  Doesn't lean forward past neutral ADL Comments: Pt is total A x 1 for all ADLs, bed level, rolling for LB, and supported sitting.  Daughter does exercises with pt daily and just finished sitting her at EOB.  She has had a decline in function since hemicolectomy in February.      OT Diagnosis: Generalized weakness  OT Problem List: Decreased strength;Decreased range of motion;Decreased activity tolerance;Impaired balance (sitting and/or standing);Impaired vision/perception;Decreased cognition;Impaired UE  functional use;Increased edema OT Treatment Interventions: Self-care/ADL training;Therapeutic activities;Balance training;Patient/family education;Cognitive remediation/compensation   OT Goals Acute Rehab OT Goals OT Goal Formulation: With family Time For Goal Achievement: 11/06/11 Potential to Achieve Goals: Fair ADL Goals Pt Will Transfer to Toilet: with 2+ total assist;Squat pivot transfer;3-in-1 (pt 25%) ADL Goal: Toilet Transfer - Progress: Goal set today Miscellaneous OT Goals Miscellaneous OT Goal #1: Pt will maintain static unsupported sitting with min guard x 2 minutes for adls OT Goal: Miscellaneous Goal #1 - Progress: Goal set today  Visit Information  Last OT Received On: 10/23/11 Assistance Needed: +2 PT/OT Co-Evaluation/Treatment: Yes    Subjective Data  Subjective: Nodded yes for sitting at EOB Patient Stated Goal: none stated.  Family agreeable for [place] to work with her in the hospital.  Wants HHPT   Prior Functioning  Home Living Lives With: Daughter (2 daughters:  one day, one night) Available Help at Discharge: Family Type of Home: House Home Access: Stairs to enter Secretary/administrator of Steps: 1 + 1 + 2 + 1 Entrance Stairs-Rails: Right Home Layout: One level Bathroom Shower/Tub: Engineer, manufacturing systems: Handicapped height Home Adaptive Equipment: Bedside commode/3-in-1;Tub transfer bench;Walker - four wheeled;Walker - rolling;Wheelchair - manual;Other (comment) (bed rail) Prior Function Level of Independence: Needs assistance (help since surgery in February) Needs Assistance: Bathing;Dressing;Toileting;Transfers;Gait Driving: No Communication Communication: Other (comment) (very quiet, some verbalizations) Dominant Hand: Left (CVA affected R)    Cognition  Overall Cognitive Status: History of cognitive impairments - at baseline Cognition - Other Comments: slow processing, only oriented to self.      Extremity/Trunk Assessment Right  Upper  Extremity Assessment RUE ROM/Strength/Tone: Deficits RUE ROM/Strength/Tone Deficits: Pt is able to lift Bil UE's to 90.  hand has edema and weak grasp is present.  Unable to hold lotion bottle Left Upper Extremity Assessment LUE ROM/Strength/Tone: [facility] for tasks assessed (to 90 degrees shoulder.  Overall 3- to 3/5 strength) Trunk Assessment Trunk Assessment:  (decreased trunk strength.  overall min to mod A for support)   Mobility Bed Mobility Bed Mobility: Rolling Left Rolling Left: 1: +2 Total assist Rolling Left: Patient Percentage: 10% Left Sidelying to Sit: 1: +2 Total assist Left Sidelying to Sit: Patient Percentage: 10%   Exercise    Balance    End of Session OT - End of Session Activity Tolerance: Patient limited by fatigue Patient left: in bed;with call bell/phone within reach;with family/visitor present   Merel Santoli 10/23/2011, 11:21 AM Marica Otter, OTR/L [phone] 10/23/2011

## 2011-10-23 NOTE — Progress Notes (Signed)
INITIAL ADULT NUTRITION ASSESSMENT Date: 10/23/2011   Time: 11:23 AM Reason for Assessment: Consult for assessment of nutrition status/ requirements  ASSESSMENT: [name]  Dx: Black blood in stool  Hx:  Past Medical History  Diagnosis Date  . Diabetes mellitus   . Hypertension   . Vascular disease   . Shortness of breath   . Bladder infection   . COPD (chronic obstructive pulmonary disease)   . Myocardial infarction   . Stroke   . GERD (gastroesophageal reflux disease)   . Diverticulitis   . CHF (congestive heart failure)     Related Meds:  Scheduled Meds:   . albuterol  2.5 mg Nebulization TID  . cefTRIAXone (ROCEPHIN)  IV  1 g Intravenous Q24H  . digoxin  125 mcg Oral QODAY  . docusate sodium  100 mg Oral BID  . famotidine  20 mg Oral QHS  . fluticasone  1 spray Each Nare Daily  . furosemide  40 mg Intravenous Once  . furosemide  20 mg Oral Daily  . insulin aspart  0-9 Units Subcutaneous Q4H  . ipratropium  0.5 mg Nebulization TID  . metoprolol tartrate  12.5 mg Oral BID  . simvastatin  40 mg Oral q1800  . sodium chloride  3 mL Intravenous Q12H  . sodium phosphate  2 enema Rectal Once   Continuous Infusions:   . DISCONTD: sodium chloride    . DISCONTD: sodium chloride 50 mL/hr (10/22/11 1704)   PRN Meds:.acetaminophen, acetaminophen, albuterol, ALPRAZolam, guaiFENesin-dextromethorphan, HYDROcodone-acetaminophen, ondansetron (ZOFRAN) IV, ondansetron   Ht: 5\' 3"  (160 cm)  Wt: 161 lb 6 oz (73.2 kg)  Ideal Wt: 52.27 kg  % Ideal Wt: 140% Wt Readings from Last 10 Encounters:  10/22/11 161 lb 6 oz (73.2 kg)  10/22/11 161 lb 6 oz (73.2 kg)  09/25/11 138 lb (62.596 kg)  09/15/11 126 lb 12.2 oz (57.5 kg)  07/15/11 148 lb 9.4 oz (67.4 kg)  07/15/11 148 lb 9.4 oz (67.4 kg)  07/15/11 148 lb 9.4 oz (67.4 kg)  06/30/11 159 lb 6.3 oz (72.3 kg)  *Weight up.  Usual Wt: 147 lb per patient's family.  % Usual Wt: 109.5%  Body mass index is 28.59 kg/(m^2).  (Overweight)  Food/Nutrition Related Hx: Patient family present at time of RD visit. They reported patient with good appetite PTA. At home patient eats breakfast, has glucerna for lunch, and eats dinner with a glucerna if intake is poor. Noted patient with wound. Per family patient has been asking for food. Family reported when patient eats she chews for a very long time before swallowing, which declines her intake at meals.   Labs:  CMP     Component Value Date/Time   NA 131* 10/23/2011 0345   K 4.6 10/23/2011 0345   CL 99 10/23/2011 0345   CO2 19 10/23/2011 0345   GLUCOSE 97 10/23/2011 0345   BUN 57* 10/23/2011 0345   CREATININE 1.48* 10/23/2011 0345   CALCIUM 8.4 10/23/2011 0345   PROT 6.1 10/22/2011 1615   ALBUMIN 2.4* 10/22/2011 1615   AST 28 10/22/2011 1615   ALT 22 10/22/2011 1615   ALKPHOS 142* 10/22/2011 1615   BILITOT 0.6 10/22/2011 1615   GFRNONAA 32* 10/23/2011 0345   GFRAA 37* 10/23/2011 0345     Intake/Output Summary (Last 24 hours) at 10/23/11 1124 Last data filed at 10/23/11 1610  Gross per 24 hour  Intake 1021.67 ml  Output      0 ml  Net 1021.67  ml     Diet Order: NPO  Supplements/Tube Feeding: none at this time  IVF:    DISCONTD: sodium chloride   DISCONTD: sodium chloride Last Rate: 50 mL/hr (10/22/11 1704)    Estimated Nutritional Needs:   Kcal: 4098-1191 Protein: 87-109 grams Fluid: minimum of 1 ml per kcal intake  NUTRITION DIAGNOSIS: -Inadequate oral intake (NI-2.1).  Status: Ongoing  -Increased nutrient needs r/t wound healing a/e/b pt with wound VAC. Status: Ongoing.  RELATED TO: inability to eat  AS EVIDENCE BY: NPO status  MONITORING/EVALUATION(Goals): Diet advancement/ tolerance, labs, weights, skin 1. Diet advancement as medically able to meet > 90% of estimated energy needs.   EDUCATION NEEDS: -No education needs identified at this time  INTERVENTION: 1. Recommend diet advancement as medically able. Once diet is advanced, please order patient  Glucerna BID and Prostat once daily. Total supplementation to provide 540 kcal and 35 grams of protein daily.  2. RD available for nutrition needs.   Dietitian [phone]  DOCUMENTATION CODES Per approved criteria  -Not Applicable    Iven Finn [facility] 10/23/2011, 11:23 AM

## 2011-10-23 NOTE — Op Note (Signed)
[facility] [address] [place], [place]  [address]  FLEXIBLE SIGMOIDOSCOPY PROCEDURE REPORT  PATIENT:  [name], [name]  MR#:  [id number] BIRTHDATE:  January 09, 1926, 85 yrs. old  GENDER:  female ENDOSCOPIST:  Jeani Hawking, MD PROCEDURE DATE:  10/23/2011 PROCEDURE:  Flexible sigmoidoscopy with APC ablation of lesion ASA CLASS:  Class III INDICATIONS:  Hematochezia MEDICATIONS:  None  DESCRIPTION OF PROCEDURE:   After the risks benefits and alternatives of the procedure were thoroughly explained, informed consent was obtained.  Digital rectal exam was performed and revealed no abnormalities.   The EG-[id number] ([id number]) endoscope was introduced through the anus and advanced to the , without limitations.  The quality of the prep was excellent.  The instrument was then slowly withdrawn as the mucosa was fully examined. <<PROCEDUREIMAGES>>  FINDINGS: Upon initial entry into the rectum there was evidence of fresh blood. The rectum washed and there was evidence of vascular abnormalities. These were very small and may represent atypical AVMs. Some oozing was noted and there was a small erosion just above the dentate line. It was not an ulcer. The proximal portion of the Hartman's pouch was intact and no other abnormalities were noted. No residual diverticula noted in the proximal section. APC was applied to the vascular abnormalities and no further oozing of blood was noted.   Retroflexion was not performed.  The scope was then withdrawn from the patient and the procedure terminated.  COMPLICATIONS:  None  ENDOSCOPIC IMPRESSION: 1) Atypical rectal AVMs. 2) Small rectal erosion. RECOMMENDATIONS: 1) Retreatment PRN symptoms.  ______________________________ Jeani Hawking, MD  n. Rosalie DoctorJeani Hawking at 10/23/2011 03:10 PM  Kevin Fenton, [phone]

## 2011-10-23 NOTE — Progress Notes (Signed)
Subjective: Sitting up on side of bed today, said hello, got very quiet while I was doing dressing change.  Objective: Vital signs in last 24 hours: Temp:  [97.2 F (36.2 C)-97.7 F (36.5 C)] 97.2 F (36.2 C) (06/07 0430) Pulse Rate:  [73-92] 75  (06/07 0430) Resp:  [15-22] 16  (06/07 0430) BP: (121-142)/(65-90) 142/90 mmHg (06/07 1041) SpO2:  [93 %-100 %] 100 % (06/07 0906) Weight:  [73.2 kg (161 lb 6 oz)] 73.2 kg (161 lb 6 oz) (06/06 1530) Last BM Date: 10/22/11  Afebrile, VSS, Labs are very stable.  Intake/Output from previous day: 06/06 0701 - 06/07 0700 In: 1021.7 [I.V.:971.7; IV Piggyback:50] Out: -  Intake/Output this shift:    General appearance: alert, cooperative and no distress Resp: Bilateral rales both bases, wheezes when lying down also. GI: soft, non-tender; bowel sounds normal; no masses,  no organomegaly and the wound vac was removed, it was not changed on time, there was a little ordor, but the wound looks clean, and granulating in well,.Ostomy looks good, stool is brown no blood noted.  Lab Results:   Basename 10/23/11 0345 10/22/11 2211  WBC 7.0 7.4  HGB 12.8 13.1  HCT 39.9 41.9  PLT 126* 120*    BMET  Basename 10/23/11 0345 10/22/11 1615  NA 131* 128*  K 4.6 4.8  CL 99 98  CO2 19 16*  GLUCOSE 97 151*  BUN 57* 59*  CREATININE 1.48* 1.46*  CALCIUM 8.4 8.3*   PT/INR No results found for this basename: LABPROT:2,INR:2 in the last 72 hours   Lab 10/22/11 1615 10/22/11 0006  AST 28 33  ALT 22 24  ALKPHOS 142* 165*  BILITOT 0.6 0.6  PROT 6.1 6.8  ALBUMIN 2.4* 2.7*     Lipase     Component Value Date/Time   LIPASE 67* 06/20/2011 2250     Studies/Results: Ct Abdomen Pelvis Wo Contrast  10/22/2011  *RADIOLOGY REPORT*  Clinical Data: GI bleed.  CT ABDOMEN AND PELVIS WITHOUT CONTRAST  Technique:  Multidetector CT imaging of the abdomen and pelvis was performed following the standard protocol without intravenous contrast.  Comparison:  None  Findings: The lung bases demonstrate bilateral pleural effusions and overlying atelectasis.  The heart is enlarged.  No pericardial effusion.  Extensive coronary artery calcifications are noted.  Findings worrisome for right breast cancer with an area of fairly significant skin thickening and subareolar density which could reflect a mass.  Recommend correlation with physical examination, mammography and ultrasound is indicated.  The unenhanced appearance of the liver is unremarkable.  No focal lesions or biliary dilatation.  The gallbladder appears heterogeneous and may contain sludge.  No obvious common bile duct dilatation.  The pancreas is atrophied.  The spleen is normal in size.  The adrenal glands and kidneys are grossly normal.  Renal cysts are noted.  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 An IVC filter is in place.  The stomach, duodenum, small bowel and colon are grossly normal without oral contrast.  A left lower quadrant colostomy is noted.  There is a small to moderate amount of free pelvic fluid and there is diffuse body wall edema suggesting anasarca.  The bladder appears normal.  High attenuation material in the rectum is most likely blood.  IMPRESSION:  1.  Cannot exclude a right-sided breast cancer.  Recommend clinical correlation as discussed above. 2.  Bilateral pleural effusions with overlying atelectasis. 3.  High attenuation material in the rectum could reflect hemorrhage. 4.  Left lower quadrant colostomy. 5.  Small to moderate amount of free pelvic fluid. 6.  Diffuse body wall edema.  Original Report Authenticated By: P. Loralie Champagne, M.D.   X-ray Chest Pa And Lateral   10/22/2011  *RADIOLOGY REPORT*  Clinical Data: Weakness and confusion.  CHEST - 2 VIEW  Comparison: 09/22/2011.  Findings: Both views are degraded by patient positioning and low lung volumes. Lateral view degraded by patient arm position.  Midline trachea.  Cardiomegaly accentuated by AP portable  technique.  Small bilateral pleural effusions are slightly increased. No pneumothorax.  Interstitial edema is slightly increased.  Moderate.  Bibasilar airspace disease.  IMPRESSION:  1.  Increasing moderate congestive heart failure. 2.  Increasing small bilateral pleural effusions. 3.  Bibasilar airspace disease, likely atelectasis.  Original Report Authenticated By: Consuello Bossier, M.D.   Ct Head Wo Contrast  10/22/2011  *RADIOLOGY REPORT*  Clinical Data: 78 year old female with altered mental status.  CT HEAD WITHOUT CONTRAST  Technique:  Contiguous axial images were obtained from the base of the skull through the vertex without contrast.  Comparison: 10/02/2011 and earlier.  Findings: Visualized paranasal sinuses and mastoids are clear. Hyperostosis of the calvarium. No acute osseous abnormality identified.  Stable postoperative appearance of the orbits. Visualized scalp soft tissues are within normal limits.  Advanced Calcified atherosclerosis at the skull base.  Chronic left thalamic lacunar infarct.  No ventriculomegaly. No midline shift, mass effect, or evidence of mass lesion.  Chronic confluent superior frontal lobe white matter hypodensity, greater on the left (series 2 image 25, but stable. No acute intracranial hemorrhage identified.  A small cerebellar lacunar infarcts or more apparent on the most recent comparison. No evidence of cortically based acute infarction identified.  No suspicious intracranial vascular hyperdensity.  IMPRESSION: No acute intracranial abnormality.  Chronic ischemic disease.  Original Report Authenticated By: Harley Hallmark, M.D.    Medications:    . albuterol  2.5 mg Nebulization TID  . cefTRIAXone (ROCEPHIN)  IV  1 g Intravenous Q24H  . digoxin  125 mcg Oral QODAY  . docusate sodium  100 mg Oral BID  . famotidine  20 mg Oral QHS  . fluticasone  1 spray Each Nare Daily  . furosemide  40 mg Intravenous Once  . furosemide  20 mg Oral Daily  . insulin aspart  0-9  Units Subcutaneous Q4H  . ipratropium  0.5 mg Nebulization TID  . metoprolol tartrate  12.5 mg Oral BID  . simvastatin  40 mg Oral q1800  . sodium chloride  3 mL Intravenous Q12H  . sodium phosphate  2 enema Rectal Once    Assessment/Plan 1. Altered mental status, hyponatremia.  2. Urinary tract infection  3. History of sigmoid diverticular bleed with extended left sigmoid colectomy, Hartman's pouch, left transverse colon colostomy, 07/12/11, Dr. Ezzard Standing. New finding of Old blood in rectum. 4. Coronary artery disease, history of MI, were current congestive heart failure/pulmonary edema with EF 37%.  5. Diabetes mellitus  6. COPD  7. History of CVA.  8. Peripheral vascular disease  9. Renal insufficiency 10. CXR shows some CHF also.   Plan:  H/H stable, for flex sig today DR. Hung.  I did the dressing change to abd and used wet to dry.  Resume wound vac tomorrw so my doctors can look at it if they want.     LOS: 2 days    [name] 10/23/2011

## 2011-10-23 NOTE — Discharge Summary (Signed)
Physician Discharge Summary  Patient ID: [name] MRN: [id number] DOB/AGE: May 17, 1926 78 y.o.  Admit date: 10/21/2011 Discharge date: 10/23/2011  Primary Care Physician:  Laurena Slimmer, MD, MD  Discharge Diagnoses:   Acute encephalopathy/lethargy : Resolved  .GI bleed: Resolved,  status post flexible sigmoidoscopy with APC ablation of the lesion  .Diverticulosis of colon with hemorrhage s/p left colectomy and colostomy 07/11/11 .Diabetes mellitus .Acute renal failure: Improving  .Acute combined systolic and diastolic congestive heart failure: Improving  .Thrombocytopenia .Hyponatremia improving  .Hypertension .UTI (lower urinary tract infection): Urine culture however showed no growth  Consults:  Gastroenterology, Dr. Luisa Hart hung                     General surgery  Discharge Medications: Medication List  As of 10/23/2011  4:14 PM   TAKE these medications         acetaminophen 500 MG tablet   Commonly known as: TYLENOL   Take 1,000 mg by mouth every 6 (six) hours as needed. For pain      albuterol (5 MG/ML) 0.5% nebulizer solution   Commonly known as: PROVENTIL   Take 0.5 mLs (2.5 mg total) by nebulization 3 (three) times daily.      albuterol-ipratropium 18-103 MCG/ACT inhaler   Commonly known as: COMBIVENT   Inhale 2 puffs into the lungs every 6 (six) hours as needed. For shortness of breath      ALPRAZolam 0.25 MG tablet   Commonly known as: XANAX   Take 0.25 mg by mouth 3 (three) times daily as needed. anxiety      atorvastatin 20 MG tablet   Commonly known as: LIPITOR   Take 20 mg by mouth at bedtime.      digoxin 0.125 MG tablet   Commonly known as: [name]   Take 125 mcg by mouth every other day.      famotidine 20 MG tablet   Commonly known as: PEPCID   Take 20 mg by mouth at bedtime.      furosemide 40 MG tablet   Commonly known as: LASIX   Take 40 mg by mouth daily.      GERITOL COMPLETE PO   Take 2 tablets by mouth daily.      ICAPS Caps   Take 1 capsule by mouth 2 (two) times daily.      insulin detemir 100 UNIT/ML injection   Commonly known as: LEVEMIR   Inject 0-15 Units into the skin See admin instructions. Patient on sliding scale in the morning and takes 15 units at bedtime      ipratropium 0.02 % nebulizer solution   Commonly known as: ATROVENT   Take 2.5 mLs (0.5 mg total) by nebulization 3 (three) times daily.      metoprolol tartrate 25 MG tablet   Commonly known as: LOPRESSOR   Take 12.5 mg by mouth 2 (two) times daily.      mometasone 50 MCG/ACT nasal spray   Commonly known as: NASONEX   Place 2 sprays into the nose at bedtime as needed. For congestion      vitamin C 100 MG tablet   Take 100 mg by mouth daily.             Brief H and P: For complete details please refer to admission H and P, but in brief, patient is 78 year old female with multiple medical history, history of massive GI bleed in February 2013 requiring colon resection and ostomy presented to ED  with old blood like stool that was Jelly like. She had had that 1 week ago the same problem but it resolved. Patient is status post colostomy secondary to colon resection for life-threatening GI bleeding in February. For the last few days she had been somnolent. She had been weaker with less by mouth intake. For the past 24 hour she have not been speaking as much. NO fever no chills. She have had episodes of hypotension at home. Lately she have had generalized swelling for a weak not improved with Lasix. Albumin is 2.7. At baseline she is able to transfer although since February she had been mainly bed bound. For the past few days she is unable to do anything for herself and unable to get up.   Hospital Course:   Briefly 78 year old female with history of diabetes, hypertension, vascular disease, history of life-threatening diverticular GI bleeding in February 2013 who underwent colon resection and colostomy by Dr. Ezzard Standing, still has a wound VAC,   Patient presented to ED with small rectal bleeding but no bleeding in the colostomy, increasing fatigue and generalized weakness, acute renal failure. UA was also positive for UTI  1) GI bleed: Patient was admitted to the telemetry floor, gastroenterology was consulted, patient underwent a flexible sigmoidoscopy which showed atypical rectal AVMs and small rectal erosion. Patient had APC ablation of the lesion for hematochezia. She will follow up outpatient with Dr. Elnoria Howard.  2) Acute renal failure: Creatinine 1.7 at the time of admission, likely worsened due to anemia/UTI/diuretics. Patient was placed on gentle hydration, creatinine improved to 1.4 at the time of discharge.   3) history of recent colectomy with colostomy: General surgery was consulted, patient had wet-to-dry dressing done at the time of discharge. Patient will have a wound VAC resumed by home health RN and wound care tomorrow.  4) altered mental status/lethargy/UTI: Improved, urine analysis at the time of admission did show UTI however urine culture was negative. Patient was started on IV Rocephin and patient's mental status was back to baseline at the time of discharge.  5) abnormal CT scan finding: Patient had a abnormal CT scan finding of questionable right breast mass/cancer. On clinical exam, the patient did not have any areolar skin thickening or palpable mass on right breast. [name] daughter, patient had lumpectomy done on rt breast, and biopsy was negative. Patient has yearly mammograms which has been negative so far but she has missed last year's mammogram. I did order right breast ultrasound however per radiology the mammograms and breast ultrasounds are done only at [facility] ([address] [place], [place] [address], [phone]). This was clearly explained to the patient and her daughter to see her PCP and coordinate mammogram and right breast US out-patient at [facility].  6) hyponatremia: Likely secondary to  dehydration and diuretics, improved  Day of Discharge BP 131/57  Pulse 96  Temp(Src) 97.2 F (36.2 C) (Axillary)  Resp 21  Ht 5\' 3"  (1.6 m)  Wt 73.2 kg (161 lb 6 oz)  BMI 28.59 kg/m2  SpO2 94%  Physical Exam: General: Alert and awake oriented x3 not in any acute distress. HEENT: anicteric sclera, pupils reactive to light and accommodation CVS: S1-S2 clear no murmur rubs or gallops Chest: clear to auscultation bilaterally, no wheezing rales or rhonchi Abdomen: Ostomy site looks good, no blood, soft nontender nondistended  Extremities: no cyanosis, clubbing or edema noted bilaterally Neuro: Cranial nerves II-XII intact, no focal neurological deficits   The results of significant  diagnostics from this hospitalization (including imaging, microbiology, ancillary and laboratory) are listed below for reference.    LAB RESULTS: Basic Metabolic Panel:  Lab 10/23/11 3086 10/22/11 1615  NA 131* 128*  K 4.6 4.8  CL 99 98  CO2 19 16*  GLUCOSE 97 151*  BUN 57* 59*  CREATININE 1.48* 1.46*  CALCIUM 8.4 8.3*  MG 2.1 --  PHOS 4.6 --   Liver Function Tests:  Lab 10/22/11 1615 10/22/11 0006  AST 28 33  ALT 22 24  ALKPHOS 142* 165*  BILITOT 0.6 0.6  PROT 6.1 6.8  ALBUMIN 2.4* 2.7*     Lab 10/23/11 0345 10/22/11 2211  WBC 7.0 7.4  NEUTROABS -- --  HGB 12.8 13.1  HCT 39.9 41.9  MCV 86.4 --  PLT 126* 120*   Cardiac Enzymes:  Lab 10/23/11 0345 10/22/11 2211  CKTOTAL 52 56  CKMB 4.0 4.4*  CKMBINDEX -- --  TROPONINI <0.30 <0.30   BNP: No components found with this basename: POCBNP:2 CBG:  Lab 10/23/11 1150 10/23/11 0732  GLUCAP 111* 109*    Significant Diagnostic Studies:  Ct Abdomen Pelvis Wo Contrast  10/22/2011  *RADIOLOGY REPORT*  Clinical Data: GI bleed.  CT ABDOMEN AND PELVIS WITHOUT CONTRAST  Technique:  Multidetector CT imaging of the abdomen and pelvis was performed following the standard protocol without intravenous contrast.  Comparison: None  Findings:  The lung bases demonstrate bilateral pleural effusions and overlying atelectasis.  The heart is enlarged.  No pericardial effusion.  Extensive coronary artery calcifications are noted.  Findings worrisome for right breast cancer with an area of fairly significant skin thickening and subareolar density which could reflect a mass.  Recommend correlation with physical examination, mammography and ultrasound is indicated.  The unenhanced appearance of the liver is unremarkable.  No focal lesions or biliary dilatation.  The gallbladder appears heterogeneous and may contain sludge.  No obvious common bile duct dilatation.  The pancreas is atrophied.  The spleen is normal in size.  The adrenal glands and kidneys are grossly normal.  Renal cysts are noted.  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 An IVC filter is in place.  The stomach, duodenum, small bowel and colon are grossly normal without oral contrast.  A left lower quadrant colostomy is noted.  There is a small to moderate amount of free pelvic fluid and there is diffuse body wall edema suggesting anasarca.  The bladder appears normal.  High attenuation material in the rectum is most likely blood.  IMPRESSION:  1.  Cannot exclude a right-sided breast cancer.  Recommend clinical correlation as discussed above. 2.  Bilateral pleural effusions with overlying atelectasis. 3.  High attenuation material in the rectum could reflect hemorrhage. 4.  Left lower quadrant colostomy. 5.  Small to moderate amount of free pelvic fluid. 6.  Diffuse body wall edema.  Original Report Authenticated By: P. Loralie Champagne, M.D.   X-ray Chest Pa And Lateral   10/22/2011  *RADIOLOGY REPORT*  Clinical Data: Weakness and confusion.  CHEST - 2 VIEW  Comparison: 09/22/2011.  Findings: Both views are degraded by patient positioning and low lung volumes. Lateral view degraded by patient arm position.  Midline trachea.  Cardiomegaly accentuated by AP portable technique.  Small  bilateral pleural effusions are slightly increased. No pneumothorax.  Interstitial edema is slightly increased.  Moderate.  Bibasilar airspace disease.  IMPRESSION:  1.  Increasing moderate congestive heart failure. 2.  Increasing small bilateral pleural effusions. 3.  Bibasilar airspace disease,  likely atelectasis.  Original Report Authenticated By: Consuello Bossier, M.D.   Ct Head Wo Contrast  10/22/2011  *RADIOLOGY REPORT*  Clinical Data: 78 year old female with altered mental status.  CT HEAD WITHOUT CONTRAST  Technique:  Contiguous axial images were obtained from the base of the skull through the vertex without contrast.  Comparison: 10/02/2011 and earlier.  Findings: Visualized paranasal sinuses and mastoids are clear. Hyperostosis of the calvarium. No acute osseous abnormality identified.  Stable postoperative appearance of the orbits. Visualized scalp soft tissues are within normal limits.  Advanced Calcified atherosclerosis at the skull base.  Chronic left thalamic lacunar infarct.  No ventriculomegaly. No midline shift, mass effect, or evidence of mass lesion.  Chronic confluent superior frontal lobe white matter hypodensity, greater on the left (series 2 image 25, but stable. No acute intracranial hemorrhage identified.  A small cerebellar lacunar infarcts or more apparent on the most recent comparison. No evidence of cortically based acute infarction identified.  No suspicious intracranial vascular hyperdensity.  IMPRESSION: No acute intracranial abnormality.  Chronic ischemic disease.  Original Report Authenticated By: Harley Hallmark, M.D.     Disposition and Follow-up: Discharge Orders    Future Orders Please Complete By Expires   Diet - low sodium heart healthy      Increase activity slowly      Discharge wound care:      Comments:   Please resume wound vac tomorrow   Discharge instructions      Comments:   1) Please resume wound vac tomorrow 2) Please make an appointment at [facility] for right breast ultrasound and mammogram       DISPOSITION: Home  DIET: Heart healthy  ACTIVITY: As tolerated  DISCHARGE FOLLOW-UP Follow-up Information    Follow up with Laurena Slimmer, MD. Schedule an appointment as soon as possible for a visit in 10 days. (for hospital follow-up)    Contact information:   [address], [address] [place] [address] [phone]       Follow up with Theda Belfast, MD. Schedule an appointment as soon as possible for a visit in 2 weeks. (for hospital follow-up)    Contact information:   [address] [place] [address] [phone]       Follow up with [facility], MD. Schedule an appointment as soon as possible for a visit in 2 weeks. (for follow-up)    Contact information:   [facility] [address], [address] [place] [address] [phone]       Follow up with [facility]. Schedule an appointment as soon as possible for a visit in 2 weeks. (please make an appointment for right breast ultrasound and mammogram)    Contact information:   [address]  [place], [place] [address] [phone]         Time spent on Discharge: 45 minutes  Signed:  Zevin Nevares M.D. [facility] 10/23/2011, 4:14 PM

## 2011-10-23 NOTE — Interval H&P Note (Signed)
History and Physical Interval Note:  10/23/2011 2:40 PM  [name]  has presented today for surgery, with the diagnosis of Hematochezia  The various methods of treatment have been discussed with the patient and family. After consideration of risks, benefits and other options for treatment, the patient has consented to  Procedure(s) (LRB): FLEXIBLE SIGMOIDOSCOPY (N/A) as a surgical intervention .  The patients' history has been reviewed, patient examined, no change in status, stable for surgery.  I have reviewed the patients' chart and labs.  Questions were answered to the patient's satisfaction.     Brennen Gardiner D

## 2011-10-23 NOTE — Progress Notes (Signed)
CARE MANAGEMENT NOTE 10/23/2011  Patient:  [name], [name]   Account Number:  [id number]  Date Initiated:  10/22/2011  Documentation initiated by:  [employer]  Subjective/Objective Assessment:   ADMITTED W/GIB.READMIT-5/6-CHF.     Action/Plan:   FROM HOME W/FAMILY.ACTIVE W/AHC-HHRN.   Anticipated DC Date:  10/28/2011   Anticipated DC Plan:  HOME W HOME HEALTH SERVICES      DC Planning Services  CM consult      [facility] Choice  HOME HEALTH   Choice offered to / List presented to:  C-4 Adult Children   DME arranged  NA      DME agency  NA     HH arranged  HH-1 RN  HH-2 PT      HH agency  [facility]   Status of service:  Completed, signed off Medicare Important Message given?  NA - LOS <3 / Initial given by admissions (If response is "NO", the following Medicare IM given date fields will be blank) Date Medicare IM given:   Date Additional Medicare IM given:    Discharge Disposition:  HOME W HOME HEALTH SERVICES  Per UR Regulation:  Reviewed for med. necessity/level of care/duration of stay  Comments:  10/23/2011 Raynelle Bring BSN CCM [phone] Cm spoke with patient and family members. Daughter states they plan to take patient home in [place] where daughters will provide care. Pt already has DME. Has oxygen at home and protable tank while transporting. Active with [facility] and wish to resume their services. Pt will require HHrn to reconnect NPWT at home. Family has device. [facility] notified and will see patient tomorrow to reconnect NPWT. HHpt has also been orderd. Family will transport patient home today by private vehicle.

## 2011-10-23 NOTE — Progress Notes (Signed)
Patient was given Fleets enema x2 morning.  Patient tolerated well.  Patient put out a large amount of very dark red, bloody, liquid stool per rectum after the enemas were administered.  Previously to this, earlier in the night, patient had not put out any such liquid, except for 'a smear' per patient's daughter.  Patient did feel some pain upon administering the enemas.  She is now resting calmly in bed with daughter at bedside.  Will continue to monitor.

## 2011-10-26 ENCOUNTER — Encounter (HOSPITAL_COMMUNITY): Payer: Self-pay | Admitting: Gastroenterology

## 2011-10-26 ENCOUNTER — Observation Stay (HOSPITAL_COMMUNITY)
Admission: EM | Admit: 2011-10-26 | Discharge: 2011-11-01 | Disposition: A | Discharge status: Hospice | Payer: PRIVATE HEALTH INSURANCE | Source: Ambulatory Visit | Attending: Internal Medicine | Admitting: Internal Medicine

## 2011-10-26 ENCOUNTER — Inpatient Hospital Stay (HOSPITAL_COMMUNITY): Payer: PRIVATE HEALTH INSURANCE

## 2011-10-26 ENCOUNTER — Emergency Department (HOSPITAL_COMMUNITY): Payer: PRIVATE HEALTH INSURANCE

## 2011-10-26 DIAGNOSIS — F430 Acute stress reaction: Secondary | ICD-10-CM | POA: Insufficient documentation

## 2011-10-26 DIAGNOSIS — I5042 Chronic combined systolic (congestive) and diastolic (congestive) heart failure: Secondary | ICD-10-CM | POA: Insufficient documentation

## 2011-10-26 DIAGNOSIS — A419 Sepsis, unspecified organism: Secondary | ICD-10-CM | POA: Insufficient documentation

## 2011-10-26 DIAGNOSIS — K5731 Diverticulosis of large intestine without perforation or abscess with bleeding: Secondary | ICD-10-CM

## 2011-10-26 DIAGNOSIS — M549 Dorsalgia, unspecified: Secondary | ICD-10-CM

## 2011-10-26 DIAGNOSIS — I509 Heart failure, unspecified: Secondary | ICD-10-CM | POA: Insufficient documentation

## 2011-10-26 DIAGNOSIS — J449 Chronic obstructive pulmonary disease, unspecified: Secondary | ICD-10-CM | POA: Insufficient documentation

## 2011-10-26 DIAGNOSIS — N179 Acute kidney failure, unspecified: Secondary | ICD-10-CM | POA: Diagnosis present

## 2011-10-26 DIAGNOSIS — E875 Hyperkalemia: Secondary | ICD-10-CM | POA: Diagnosis present

## 2011-10-26 DIAGNOSIS — E872 Acidosis, unspecified: Secondary | ICD-10-CM | POA: Insufficient documentation

## 2011-10-26 DIAGNOSIS — R4182 Altered mental status, unspecified: Secondary | ICD-10-CM

## 2011-10-26 DIAGNOSIS — N390 Urinary tract infection, site not specified: Secondary | ICD-10-CM | POA: Diagnosis present

## 2011-10-26 DIAGNOSIS — E782 Mixed hyperlipidemia: Secondary | ICD-10-CM

## 2011-10-26 DIAGNOSIS — R609 Edema, unspecified: Secondary | ICD-10-CM | POA: Insufficient documentation

## 2011-10-26 DIAGNOSIS — I5022 Chronic systolic (congestive) heart failure: Secondary | ICD-10-CM

## 2011-10-26 DIAGNOSIS — K922 Gastrointestinal hemorrhage, unspecified: Secondary | ICD-10-CM

## 2011-10-26 DIAGNOSIS — R0600 Dyspnea, unspecified: Secondary | ICD-10-CM

## 2011-10-26 DIAGNOSIS — R1310 Dysphagia, unspecified: Secondary | ICD-10-CM | POA: Insufficient documentation

## 2011-10-26 DIAGNOSIS — Z515 Encounter for palliative care: Secondary | ICD-10-CM | POA: Insufficient documentation

## 2011-10-26 DIAGNOSIS — N183 Chronic kidney disease, stage 3 unspecified: Secondary | ICD-10-CM

## 2011-10-26 DIAGNOSIS — J811 Chronic pulmonary edema: Secondary | ICD-10-CM

## 2011-10-26 DIAGNOSIS — I447 Left bundle-branch block, unspecified: Secondary | ICD-10-CM

## 2011-10-26 DIAGNOSIS — B3749 Other urogenital candidiasis: Secondary | ICD-10-CM

## 2011-10-26 DIAGNOSIS — Z9049 Acquired absence of other specified parts of digestive tract: Secondary | ICD-10-CM | POA: Insufficient documentation

## 2011-10-26 DIAGNOSIS — E46 Unspecified protein-calorie malnutrition: Secondary | ICD-10-CM | POA: Insufficient documentation

## 2011-10-26 DIAGNOSIS — R109 Unspecified abdominal pain: Secondary | ICD-10-CM | POA: Insufficient documentation

## 2011-10-26 DIAGNOSIS — J4489 Other specified chronic obstructive pulmonary disease: Secondary | ICD-10-CM | POA: Insufficient documentation

## 2011-10-26 DIAGNOSIS — I5041 Acute combined systolic (congestive) and diastolic (congestive) heart failure: Secondary | ICD-10-CM

## 2011-10-26 DIAGNOSIS — Z933 Colostomy status: Secondary | ICD-10-CM | POA: Insufficient documentation

## 2011-10-26 DIAGNOSIS — G9341 Metabolic encephalopathy: Secondary | ICD-10-CM | POA: Insufficient documentation

## 2011-10-26 DIAGNOSIS — J90 Pleural effusion, not elsewhere classified: Secondary | ICD-10-CM | POA: Insufficient documentation

## 2011-10-26 DIAGNOSIS — Z6828 Body mass index (BMI) 28.0-28.9, adult: Secondary | ICD-10-CM | POA: Insufficient documentation

## 2011-10-26 DIAGNOSIS — I4891 Unspecified atrial fibrillation: Secondary | ICD-10-CM | POA: Insufficient documentation

## 2011-10-26 DIAGNOSIS — G9340 Encephalopathy, unspecified: Secondary | ICD-10-CM | POA: Diagnosis present

## 2011-10-26 DIAGNOSIS — N19 Unspecified kidney failure: Secondary | ICD-10-CM

## 2011-10-26 DIAGNOSIS — I10 Essential (primary) hypertension: Secondary | ICD-10-CM

## 2011-10-26 DIAGNOSIS — E871 Hypo-osmolality and hyponatremia: Secondary | ICD-10-CM

## 2011-10-26 DIAGNOSIS — D65 Disseminated intravascular coagulation [defibrination syndrome]: Secondary | ICD-10-CM | POA: Diagnosis present

## 2011-10-26 DIAGNOSIS — E119 Type 2 diabetes mellitus without complications: Secondary | ICD-10-CM | POA: Diagnosis present

## 2011-10-26 DIAGNOSIS — D696 Thrombocytopenia, unspecified: Secondary | ICD-10-CM

## 2011-10-26 DIAGNOSIS — R197 Diarrhea, unspecified: Secondary | ICD-10-CM

## 2011-10-26 DIAGNOSIS — Z86718 Personal history of other venous thrombosis and embolism: Secondary | ICD-10-CM | POA: Insufficient documentation

## 2011-10-26 HISTORY — DX: Acute embolism and thrombosis of unspecified deep veins of unspecified lower extremity: I82.409

## 2011-10-26 HISTORY — DX: Unspecified atrial fibrillation: I48.91

## 2011-10-26 LAB — URINE MICROSCOPIC-ADD ON

## 2011-10-26 LAB — URINALYSIS, ROUTINE W REFLEX MICROSCOPIC
Glucose, UA: NEGATIVE mg/dL
Ketones, ur: NEGATIVE mg/dL
Protein, ur: 30 mg/dL — AB
Urobilinogen, UA: 0.2 mg/dL (ref 0.0–1.0)

## 2011-10-26 LAB — DIGOXIN LEVEL: Digoxin Level: 1 ng/mL (ref 0.8–2.0)

## 2011-10-26 LAB — DIFFERENTIAL
Basophils Relative: 0 % (ref 0–1)
Eosinophils Relative: 0 % (ref 0–5)
Lymphs Abs: 0.9 10*3/uL (ref 0.7–4.0)
Monocytes Absolute: 0.7 10*3/uL (ref 0.1–1.0)
Monocytes Relative: 11 % (ref 3–12)

## 2011-10-26 LAB — COMPREHENSIVE METABOLIC PANEL
AST: 52 U/L — ABNORMAL HIGH (ref 0–37)
Albumin: 2.7 g/dL — ABNORMAL LOW (ref 3.5–5.2)
Alkaline Phosphatase: 142 U/L — ABNORMAL HIGH (ref 39–117)
BUN: 72 mg/dL — ABNORMAL HIGH (ref 6–23)
CO2: 13 mEq/L — ABNORMAL LOW (ref 19–32)
Chloride: 96 mEq/L (ref 96–112)
Creatinine, Ser: 2.03 mg/dL — ABNORMAL HIGH (ref 0.50–1.10)
GFR calc non Af Amer: 22 mL/min — ABNORMAL LOW (ref >90)
Potassium: 6.7 mEq/L (ref 3.5–5.1)
Total Bilirubin: 0.7 mg/dL (ref 0.3–1.2)

## 2011-10-26 LAB — CBC
HCT: 41.5 % (ref 36.0–46.0)
Hemoglobin: 13.1 g/dL (ref 12.0–15.0)
MCH: 27.2 pg (ref 26.0–34.0)
MCV: 86.1 fL (ref 78.0–100.0)
RBC: 4.82 MIL/uL (ref 3.87–5.11)
WBC: 6.5 10*3/uL (ref 4.0–10.5)

## 2011-10-26 LAB — AMMONIA: Ammonia: 26 umol/L (ref 11–60)

## 2011-10-26 LAB — BASIC METABOLIC PANEL
Chloride: 95 mEq/L — ABNORMAL LOW (ref 96–112)
Creatinine, Ser: 2.12 mg/dL — ABNORMAL HIGH (ref 0.50–1.10)
GFR calc Af Amer: 24 mL/min — ABNORMAL LOW (ref >90)
Potassium: 5.6 mEq/L — ABNORMAL HIGH (ref 3.5–5.1)

## 2011-10-26 LAB — PRO B NATRIURETIC PEPTIDE: Pro B Natriuretic peptide (BNP): 31823 pg/mL — ABNORMAL HIGH (ref 0–450)

## 2011-10-26 MED ORDER — INSULIN ASPART 100 UNIT/ML ~~LOC~~ SOLN
10.0000 [IU] | Freq: Once | SUBCUTANEOUS | Status: AC
  Administered 2011-10-26: 10 [IU] via INTRAVENOUS
  Filled 2011-10-26: qty 1

## 2011-10-26 MED ORDER — SODIUM BICARBONATE 8.4 % IV SOLN
50.0000 meq | Freq: Once | INTRAVENOUS | Status: AC
  Administered 2011-10-26: 50 meq via INTRAVENOUS
  Filled 2011-10-26 (×2): qty 50

## 2011-10-26 MED ORDER — SODIUM CHLORIDE 0.9 % IV SOLN
1.0000 g | Freq: Once | INTRAVENOUS | Status: AC
  Administered 2011-10-26: 1 g via INTRAVENOUS
  Filled 2011-10-26: qty 10

## 2011-10-26 MED ORDER — SODIUM POLYSTYRENE SULFONATE 15 GM/60ML PO SUSP
30.0000 g | Freq: Once | ORAL | Status: AC
  Administered 2011-10-26: 30 g via ORAL
  Filled 2011-10-26 (×2): qty 60

## 2011-10-26 MED ORDER — DEXTROSE 50 % IV SOLN
50.0000 mL | Freq: Once | INTRAVENOUS | Status: AC
  Administered 2011-10-26: 50 mL via INTRAVENOUS
  Filled 2011-10-26: qty 50

## 2011-10-26 NOTE — H&P (Addendum)
[name] is an 78 y.o. female.   PCP - Dr.Preston Chestine Spore. Chief Complaint: Altered mental status. HPI: 78 year-old female who was just recently discharged after being treated for bleeding from the colostomy site and was found to have AVMs, and also patient was having encephalopathy from UTI was brought to the ER as patient's family found her to be increasingly confused lethargic since evening. In addition they also noticed patient increasing coughing. Patient did not loose consciousness. Did not have any nausea vomiting or diarrhea but did complain of some abdominal discomfort. In the ER patient was found to have hyperkalemia with no EKG changes and worsening renal function with acidosis. Patient has been admitted for further workup. Patient at this time is afebrile and is getting more well oriented and following commands. [name] family feels he is getting better after coming to the ER.   Past Medical History  Diagnosis Date  . Diabetes mellitus   . Hypertension   . Vascular disease   . Shortness of breath   . Bladder infection   . COPD (chronic obstructive pulmonary disease)   . Myocardial infarction   . Stroke   . GERD (gastroesophageal reflux disease)   . Diverticulitis   . CHF (congestive heart failure)   . A-fib   . DVT (deep venous thrombosis)     Past Surgical History  Procedure Date  . Bladder tack   . Femoral artery stent   . Abdominal hysterectomy   . Esophagogastroduodenoscopy 07/12/2011    Procedure: ESOPHAGOGASTRODUODENOSCOPY (EGD);  Surgeon: Theda Belfast, MD;  Location: [facility];  Service: Endoscopy;  Laterality: N/A;  bedside   . Flexible sigmoidoscopy 07/12/2011    Procedure: FLEXIBLE SIGMOIDOSCOPY;  Surgeon: Theda Belfast, MD;  Location: [facility];  Service: Endoscopy;  Laterality: N/A;  . Laparotomy 07/12/2011    Procedure: EXPLORATORY LAPAROTOMY;  Surgeon: Kandis Cocking, MD;  Location: [facility];  Service: General;  Laterality: N/A;  exploratory  laparotomy, left sigmoid colectomy, creation of colostomy  . Abdominal surgery   . Colostomy   . Eye surgery   . Ivc filter   . Cataract extraction, bilateral   . Retina reattachment surgery   . Flexible sigmoidoscopy 10/23/2011    Procedure: FLEXIBLE SIGMOIDOSCOPY;  Surgeon: Theda Belfast, MD;  Location: [facility];  Service: Endoscopy;  Laterality: N/A;    History reviewed. No pertinent family history. Social History:  reports that she has quit smoking. She has never used smokeless tobacco. She reports that she does not drink alcohol or use illicit drugs.  Allergies:  Allergies  Allergen Reactions  . Avelox (Moxifloxacin Hcl In Nacl) Other (See Comments)    unknown  . Proton Pump Inhibitors Other (See Comments)    Thrombocytopenia  . Warfarin Sodium Other (See Comments)    Causes severe bleeding     (Not in a hospital admission)  Results for orders placed during the hospital encounter of 10/26/11 (from the past 48 hour(s))  CBC     Status: Abnormal   Collection Time   10/26/11  6:48 PM      Component Value Range Comment   WBC 6.5  4.0 - 10.5 (K/uL)    RBC 4.82  3.87 - 5.11 (MIL/uL)    Hemoglobin 13.1  12.0 - 15.0 (g/dL)    HCT 16.1  09.6 - 04.5 (%)    MCV 86.1  78.0 - 100.0 (fL)    MCH 27.2  26.0 - 34.0 (pg)    MCHC  31.6  30.0 - 36.0 (g/dL)    RDW 16.1 (*) 09.6 - 15.5 (%)    Platelets 89 (*) 150 - 400 (K/uL) PLATELET COUNT CONFIRMED BY SMEAR  DIFFERENTIAL     Status: Normal   Collection Time   10/26/11  6:48 PM      Component Value Range Comment   Neutrophils Relative 75  43 - 77 (%)    Lymphocytes Relative 14  12 - 46 (%)    Monocytes Relative 11  3 - 12 (%)    Eosinophils Relative 0  0 - 5 (%)    Basophils Relative 0  0 - 1 (%)    Neutro Abs 4.9  1.7 - 7.7 (K/uL)    Lymphs Abs 0.9  0.7 - 4.0 (K/uL)    Monocytes Absolute 0.7  0.1 - 1.0 (K/uL)    Eosinophils Absolute 0.0  0.0 - 0.7 (K/uL)    Basophils Absolute 0.0  0.0 - 0.1 (K/uL)    RBC Morphology  POLYCHROMASIA PRESENT      Smear Review LARGE PLATELETS PRESENT     COMPREHENSIVE METABOLIC PANEL     Status: Abnormal   Collection Time   10/26/11  6:48 PM      Component Value Range Comment   Sodium 128 (*) 135 - 145 (mEq/L)    Potassium 6.7 (*) 3.5 - 5.1 (mEq/L)    Chloride 96  96 - 112 (mEq/L)    CO2 13 (*) 19 - 32 (mEq/L)    Glucose, Bld 216 (*) 70 - 99 (mg/dL)    BUN 72 (*) 6 - 23 (mg/dL)    Creatinine, Ser 0.45 (*) 0.50 - 1.10 (mg/dL)    Calcium 8.7  8.4 - 10.5 (mg/dL)    Total Protein 6.6  6.0 - 8.3 (g/dL)    Albumin 2.7 (*) 3.5 - 5.2 (g/dL)    AST 52 (*) 0 - 37 (U/L) SLIGHT HEMOLYSIS   ALT 31  0 - 35 (U/L)    Alkaline Phosphatase 142 (*) 39 - 117 (U/L)    Total Bilirubin 0.7  0.3 - 1.2 (mg/dL)    GFR calc non Af Amer 22 (*) >90 (mL/min)    GFR calc Af Amer 25 (*) >90 (mL/min)   PRO B NATRIURETIC PEPTIDE     Status: Abnormal   Collection Time   10/26/11  6:59 PM      Component Value Range Comment   Pro B Natriuretic peptide (BNP) 31823.0 (*) 0 - 450 (pg/mL)   URINALYSIS, ROUTINE W REFLEX MICROSCOPIC     Status: Abnormal   Collection Time   10/26/11  7:56 PM      Component Value Range Comment   Color, Urine AMBER (*) YELLOW  BIOCHEMICALS MAY BE AFFECTED BY COLOR   APPearance TURBID (*) CLEAR     Specific Gravity, Urine 1.019  1.005 - 1.030     pH 5.0  5.0 - 8.0     Glucose, UA NEGATIVE  NEGATIVE (mg/dL)    Hgb urine dipstick LARGE (*) NEGATIVE     Bilirubin Urine NEGATIVE  NEGATIVE     Ketones, ur NEGATIVE  NEGATIVE (mg/dL)    Protein, ur 30 (*) NEGATIVE (mg/dL)    Urobilinogen, UA 0.2  0.0 - 1.0 (mg/dL)    Nitrite NEGATIVE  NEGATIVE     Leukocytes, UA LARGE (*) NEGATIVE    URINE MICROSCOPIC-ADD ON     Status: Abnormal   Collection Time   10/26/11  7:56 PM  Component Value Range Comment   WBC, UA TOO NUMEROUS TO COUNT  <3 (WBC/hpf) WBC'S NOTED IN CLUMPS   RBC / HPF 0-2  <3 (RBC/hpf)    Bacteria, UA MANY (*) RARE     Urine-Other FEW YEAST     AMMONIA      Status: Normal   Collection Time   10/26/11 10:12 PM      Component Value Range Comment   Ammonia 26  11 - 60 (umol/L)    Ct Head Wo Contrast  10/26/2011  *RADIOLOGY REPORT*  Clinical Data: Altered mental status.  CT HEAD WITHOUT CONTRAST  Technique:  Contiguous axial images were obtained from the base of the skull through the vertex without contrast.  Comparison: 10/22/2011  Findings: There is no acute intracranial hemorrhage, infarction, or mass lesion.  There are multiple old white matter infarcts, unchanged.  Mild diffuse atrophy.  Calcification and abnormal increased density in the right eye.  This is chronic.  No osseous abnormality.  IMPRESSION: No acute intracranial abnormality.  Multiple old lacunar  infarcts.  Original Report Authenticated By: Gwynn Burly, M.D.   Dg Chest Portable 1 View  10/26/2011  *RADIOLOGY REPORT*  Clinical Data: Altered mental status.  PORTABLE CHEST - 1 VIEW  Comparison: 10/22/2011  Findings: Cardiomegaly accentuated by AP portable technique. Probable small left pleural effusion, decreased.  Decreased or resolved right pleural effusion.  No pneumothorax.  Lung volumes remain low.  Improved to resolved interstitial edema.  Minimal venous congestion suspected.  Improved bibasilar aeration with mild left base air space disease remaining.  IMPRESSION: 1.  Overall improved aeration with decreased to resolved congestive heart failure.  2.  Residual small left pleural effusion with adjacent airspace disease, likely atelectasis.  Original Report Authenticated By: Consuello Bossier, M.D.    Review of Systems  Constitutional:       Lethargy.  HENT: Negative.   Eyes: Negative.   Respiratory: Positive for cough.   Cardiovascular: Negative.   Gastrointestinal: Negative.   Genitourinary: Negative.   Musculoskeletal: Negative.   Skin: Negative.   Neurological: Positive for weakness.  Endo/Heme/Allergies: Negative.   Psychiatric/Behavioral: Negative.     Blood pressure  155/92, pulse 86, temperature 98.2 F (36.8 C), temperature source Rectal, resp. rate 31, SpO2 98.00%. Physical Exam  Constitutional: She appears well-developed and well-nourished. No distress.  HENT:  Head: Normocephalic and atraumatic.  Right Ear: External ear normal.  Left Ear: External ear normal.  Eyes: Right eye exhibits no discharge. Left eye exhibits no discharge. No scleral icterus.       Poor vision.  Neck: Normal range of motion. Neck supple.  Cardiovascular: Normal rate and regular rhythm.   Respiratory: Effort normal and breath sounds normal. No respiratory distress. She has no wheezes. She has no rales.  GI: Soft. Bowel sounds are normal. She exhibits no distension. There is no tenderness. There is no rebound and no guarding.       Colostomy bag seen.  Musculoskeletal: She exhibits edema. She exhibits no tenderness.  Neurological: She is alert.       Oriented to name only. Follows commands. Moves all extremities.  Skin: She is not diaphoretic.     Assessment/Plan #1. Acute encephalopathy probably secondary to metabolic and infectious causes  - patient has acute renal failure with acidosis. Also has UTI and was recently treated discharge on by mouth antibiotics. Patient is afebrile at this time and CT head does not show anything acute. Probably patient's confusion is from  her acute renal failure and UTI. Patient also has a lot of cough no chest x-ray does not show any definite infiltrates. At this time I'm placing patient empirically on broad-spectrum antibiotics until we get cultures back. In addition we'll check digoxin level, ammonia and an ABG. Since patient has cough we'll check swallow evaluation. #2. Hyperkalemia with acute renal failure on chronic kidney disease - patient has hyperkalemia but no new EKG changes. Patient has been given 30 g of Kayexalate and patient has taken it along with D50 one ampule calcium gluconate and IV insulin. Recheck metabolic panel in a few  hours to see the potassium trends. At this time I'm holding off Lasix and her potassium and digoxin. Closely follow intake output. #3. Thrombocytopenia - closely follow CBC. Any further worsening will need further workup. Check peripheral smear study. #4. Hyponatremia - patient has peripheral edema. But due to her worsening renal function I think patient may be intravascularly fluid depleted. For now closely follow intake output and metabolic panel. #5. Chronic combined systolic and diastolic heart failure - closely observe. Due to worsening renal function I am holding off on Lasix and digoxin. #6. Recent left-sided colectomy for diverticular bleed with colostomy bag was recently admitted for bleeding from the colostomy site and had AVMs cauterized. [name] family states she did earlier complain of abdominal pain though she did not complain to me. Given acidosis and will check lactic acid and CT abdomen pelvis. #7. Diabetes mellitus2 - I am decreasing her Levemir dose until we are sure of her by mouth intake.     CODE STATUS - DO NOT RESUSCITATE as discussed with patient's daughter's who are at the bedside.  Antion Andres N. 10/26/2011, 11:01 PM

## 2011-10-26 NOTE — ED Notes (Signed)
Dr. Preston Fleeting was notified of 6.7 potassium

## 2011-10-26 NOTE — ED Notes (Signed)
Received critical potassium lab of 6.7 - lab tech stated specimen was slightly hemolyzed

## 2011-10-26 NOTE — ED Notes (Signed)
Pt has 4+ pitting edema on RLE and LLE - extremities are warm to touch >3 seconds capillary refill

## 2011-10-26 NOTE — ED Notes (Signed)
Patient transported to CT 

## 2011-10-26 NOTE — ED Notes (Addendum)
Per EMS, pt lives with daughter at home, and at 68 the daughter started noticing that the patient wasn't as alert as she was before. HHN came in and saw her sats had dropped to low 90's. EMS gave O2 at 2L/[place] and sats came up to 98%. Afib on monitor. Pt only responds when coughing. Her right pupil is nonreactive. CBG 214.  Daughters state that she has had a decrease in urine output. Pt was recently seen and discharged on 10/23/11 for rectal bleeding, cauterized and sent back home the same day.

## 2011-10-26 NOTE — ED Provider Notes (Signed)
History     CSN: [id number]  Arrival date & time 10/26/11  1803   First MD Initiated Contact with Patient 10/26/11 1807      Chief Complaint  Patient presents with  . Altered Mental Status    (Consider location/radiation/quality/duration/timing/severity/associated sxs/prior treatment) Patient is a 78 y.o. female presenting with altered mental status. The history is provided by a relative. The history is limited by the condition of the patient (Altered mental status).  Altered Mental Status  She had been discharged the hospital 3 days ago after being admitted for hypotension, renal insufficiency, and GI bleeding. Visiting nurse today noted decreased mentation. Family is not aware of any fever. She has been coughing. There's been no vomiting or diarrhea. She did receive a dose of alprazolam today and the dose given was 1.5 tablets which is 0.375 mg. She's not receive any medications which have not prescribed for her.  Past Medical History  Diagnosis Date  . Diabetes mellitus   . Hypertension   . Vascular disease   . Shortness of breath   . Bladder infection   . COPD (chronic obstructive pulmonary disease)   . Myocardial infarction   . Stroke   . GERD (gastroesophageal reflux disease)   . Diverticulitis   . CHF (congestive heart failure)   . A-fib   . DVT (deep venous thrombosis)     Past Surgical History  Procedure Date  . Bladder tack   . Femoral artery stent   . Abdominal hysterectomy   . Esophagogastroduodenoscopy 07/12/2011    Procedure: ESOPHAGOGASTRODUODENOSCOPY (EGD);  Surgeon: Theda Belfast, MD;  Location: [facility];  Service: Endoscopy;  Laterality: N/A;  bedside   . Flexible sigmoidoscopy 07/12/2011    Procedure: FLEXIBLE SIGMOIDOSCOPY;  Surgeon: Theda Belfast, MD;  Location: [facility];  Service: Endoscopy;  Laterality: N/A;  . Laparotomy 07/12/2011    Procedure: EXPLORATORY LAPAROTOMY;  Surgeon: Kandis Cocking, MD;  Location: [facility];  Service: General;   Laterality: N/A;  exploratory laparotomy, left sigmoid colectomy, creation of colostomy  . Abdominal surgery   . Colostomy   . Eye surgery   . Ivc filter   . Cataract extraction, bilateral   . Retina reattachment surgery   . Flexible sigmoidoscopy 10/23/2011    Procedure: FLEXIBLE SIGMOIDOSCOPY;  Surgeon: Theda Belfast, MD;  Location: [facility];  Service: Endoscopy;  Laterality: N/A;    History reviewed. No pertinent family history.  History  Substance Use Topics  . Smoking status: Former Games developer  . Smokeless tobacco: Never Used  . Alcohol Use: No    OB History    Grav Para Term Preterm Abortions TAB SAB Ect Mult Living                  Review of Systems  Unable to perform ROS: Mental status change  Psychiatric/Behavioral: Positive for altered mental status.    Allergies  Avelox; Proton pump inhibitors; and Warfarin sodium  Home Medications   Current Outpatient Rx  Name Route Sig Dispense Refill  . ACETAMINOPHEN 500 MG PO TABS Oral Take 1,000 mg by mouth every 6 (six) hours as needed. For pain    . ALBUTEROL SULFATE (5 MG/ML) 0.5% IN NEBU Nebulization Take 0.5 mLs (2.5 mg total) by nebulization 3 (three) times daily. 20 mL 0  . IPRATROPIUM-ALBUTEROL 18-103 MCG/ACT IN AERO Inhalation Inhale 2 puffs into the lungs every 6 (six) hours as needed. For shortness of breath    . ALPRAZOLAM 0.25 MG PO  TABS Oral Take 0.25 mg by mouth 3 (three) times daily as needed. anxiety    . VITAMIN C 100 MG PO TABS Oral Take 100 mg by mouth daily.    . ATORVASTATIN CALCIUM 20 MG PO TABS Oral Take 20 mg by mouth at bedtime.    . CEPHALEXIN 500 MG PO CAPS Oral Take 1 capsule (500 mg total) by mouth 2 (two) times daily. x3days 6 capsule 0  . DIGOXIN 0.125 MG PO TABS Oral Take 125 mcg by mouth every other day.    Marland Kitchen FAMOTIDINE 20 MG PO TABS Oral Take 20 mg by mouth at bedtime.    . FUROSEMIDE 40 MG PO TABS Oral Take 40 mg by mouth daily.     . INSULIN DETEMIR 100 UNIT/ML [place] SOLN Subcutaneous  Inject 0-15 Units into the skin See admin instructions. Patient on sliding scale in the morning and takes 15 units at bedtime    . IPRATROPIUM BROMIDE 0.02 % IN SOLN Nebulization Take 2.5 mLs (0.5 mg total) by nebulization 3 (three) times daily. 75 mL 0  . GERITOL COMPLETE PO Oral Take 2 tablets by mouth daily.    Marland Kitchen METOPROLOL TARTRATE 25 MG PO TABS Oral Take 12.5 mg by mouth 2 (two) times daily.    . MOMETASONE FUROATE 50 MCG/ACT NA SUSP Nasal Place 2 sprays into the nose at bedtime as needed. For congestion    . ICAPS PO CAPS Oral Take 1 capsule by mouth 2 (two) times daily.      BP 124/63  Pulse 74  Temp(Src) 98.2 F (36.8 C) (Rectal)  Resp 26  SpO2 97%  Physical Exam  Nursing note and vitals reviewed.  78 year old female who is somnolent. She does respond to pain but does not respond to voice. Vital signs are significant for tachypnea with respiratory rate of 26. Oxygen saturation is 97% which is normal. Head is normocephalic and atraumatic. PERRLA. Neck is nontender and supple without adenopathy. JVD is present. Back is nontender. Lungs have rales present at the left base. Heart has regular rate and rhythm with a 2/6 systolic ejection murmur heard along the lower left sternal border. Abdomen is soft, nontender. Colostomy is present and a dressing is in place over a surgical wound. This dressing is not removed. Extremities have 2+ edema, no cyanosis. In fact, she has mild anasarca with him 1+ edema present on the arms and on the abdomen. Skin is warm and dry without rash. Neurologic: Mental status is as noted above. There are no gross cranial nerve deficits and no gross motor deficits.   ED Course  Procedures (including critical care time)  Results for orders placed during the hospital encounter of 10/26/11  CBC      Component Value Range   WBC 6.5  4.0 - 10.5 (K/uL)   RBC 4.82  3.87 - 5.11 (MIL/uL)   Hemoglobin 13.1  12.0 - 15.0 (g/dL)   HCT 78.2  95.6 - 21.3 (%)   MCV 86.1  78.0 -  100.0 (fL)   MCH 27.2  26.0 - 34.0 (pg)   MCHC 31.6  30.0 - 36.0 (g/dL)   RDW 08.6 (*) 57.8 - 15.5 (%)   Platelets 89 (*) 150 - 400 (K/uL)  DIFFERENTIAL      Component Value Range   Neutrophils Relative 75  43 - 77 (%)   Lymphocytes Relative 14  12 - 46 (%)   Monocytes Relative 11  3 - 12 (%)   Eosinophils Relative 0  0 - 5 (%)   Basophils Relative 0  0 - 1 (%)   Neutro Abs 4.9  1.7 - 7.7 (K/uL)   Lymphs Abs 0.9  0.7 - 4.0 (K/uL)   Monocytes Absolute 0.7  0.1 - 1.0 (K/uL)   Eosinophils Absolute 0.0  0.0 - 0.7 (K/uL)   Basophils Absolute 0.0  0.0 - 0.1 (K/uL)   RBC Morphology POLYCHROMASIA PRESENT     Smear Review LARGE PLATELETS PRESENT    COMPREHENSIVE METABOLIC PANEL      Component Value Range   Sodium 128 (*) 135 - 145 (mEq/L)   Potassium 6.7 (*) 3.5 - 5.1 (mEq/L)   Chloride 96  96 - 112 (mEq/L)   CO2 13 (*) 19 - 32 (mEq/L)   Glucose, Bld 216 (*) 70 - 99 (mg/dL)   BUN 72 (*) 6 - 23 (mg/dL)   Creatinine, Ser 3.08 (*) 0.50 - 1.10 (mg/dL)   Calcium 8.7  8.4 - 65.7 (mg/dL)   Total Protein 6.6  6.0 - 8.3 (g/dL)   Albumin 2.7 (*) 3.5 - 5.2 (g/dL)   AST 52 (*) 0 - 37 (U/L)   ALT 31  0 - 35 (U/L)   Alkaline Phosphatase 142 (*) 39 - 117 (U/L)   Total Bilirubin 0.7  0.3 - 1.2 (mg/dL)   GFR calc non Af Amer 22 (*) >90 (mL/min)   GFR calc Af Amer 25 (*) >90 (mL/min)  URINALYSIS, ROUTINE W REFLEX MICROSCOPIC      Component Value Range   Color, Urine AMBER (*) YELLOW    APPearance TURBID (*) CLEAR    Specific Gravity, Urine 1.019  1.005 - 1.030    pH 5.0  5.0 - 8.0    Glucose, UA NEGATIVE  NEGATIVE (mg/dL)   Hgb urine dipstick LARGE (*) NEGATIVE    Bilirubin Urine NEGATIVE  NEGATIVE    Ketones, ur NEGATIVE  NEGATIVE (mg/dL)   Protein, ur 30 (*) NEGATIVE (mg/dL)   Urobilinogen, UA 0.2  0.0 - 1.0 (mg/dL)   Nitrite NEGATIVE  NEGATIVE    Leukocytes, UA LARGE (*) NEGATIVE   PRO B NATRIURETIC PEPTIDE      Component Value Range   Pro B Natriuretic peptide (BNP) 31823.0 (*) 0 - 450  (pg/mL)  URINE MICROSCOPIC-ADD ON      Component Value Range   WBC, UA TOO NUMEROUS TO COUNT  <3 (WBC/hpf)   RBC / HPF 0-2  <3 (RBC/hpf)   Bacteria, UA MANY (*) RARE    Urine-Other FEW YEAST     Ct Head Wo Contrast  10/26/2011  *RADIOLOGY REPORT*  Clinical Data: Altered mental status.  CT HEAD WITHOUT CONTRAST  Technique:  Contiguous axial images were obtained from the base of the skull through the vertex without contrast.  Comparison: 10/22/2011  Findings: There is no acute intracranial hemorrhage, infarction, or mass lesion.  There are multiple old white matter infarcts, unchanged.  Mild diffuse atrophy.  Calcification and abnormal increased density in the right eye.  This is chronic.  No osseous abnormality.  IMPRESSION: No acute intracranial abnormality.  Multiple old lacunar  infarcts.  Original Report Authenticated By: Gwynn Burly, M.D.   Dg Chest Portable 1 View  10/26/2011  *RADIOLOGY REPORT*  Clinical Data: Altered mental status.  PORTABLE CHEST - 1 VIEW  Comparison: 10/22/2011  Findings: Cardiomegaly accentuated by AP portable technique. Probable small left pleural effusion, decreased.  Decreased or resolved right pleural effusion.  No pneumothorax.  Lung volumes remain low.  Improved  to resolved interstitial edema.  Minimal venous congestion suspected.  Improved bibasilar aeration with mild left base air space disease remaining.  IMPRESSION: 1.  Overall improved aeration with decreased to resolved congestive heart failure.  2.  Residual small left pleural effusion with adjacent airspace disease, likely atelectasis.  Original Report Authenticated By: Consuello Bossier, M.D.      Date: 10/26/2011  Rate: 70  Rhythm: normal sinus rhythm and with Mobitz 1 AV block  QRS Axis: left  Intervals: PR prolonged  ST/T Wave abnormalities: normal  Conduction Disutrbances:second-degree A-V block, ( Mobitz I ) and left bundle branch block  Narrative Interpretation: Left bundle-branch block, Mobitz  1 second-degree AV block. When compared with ECG of Sep 22 2011, Mobitz 1 second-degree A-V block has replaced normal sinus rhythm.  Old EKG Reviewed: changes noted    1. Altered mental status   2. Renal failure   3. Urinary tract infection   4. Hyperkalemia     CRITICAL CARE Performed by: [email or web address]   Total critical care time: 60 minutes  Critical care time was exclusive of separately billable procedures and treating other patients.  Critical care was necessary to treat or prevent imminent or life-threatening deterioration.  Critical care was time spent personally by me on the following activities: development of treatment plan with patient and/or surrogate as well as nursing, discussions with consultants, evaluation of patient's response to treatment, examination of patient, obtaining history from patient or surrogate, ordering and performing treatments and interventions, ordering and review of laboratory studies, ordering and review of radiographic studies, pulse oximetry and re-evaluation of patient's condition.   MDM  Altered mental status which is probably multifactorial. Her dose of a benzodiazepine may have contributed to some of her decreased mentation. She will be evaluated for occult infection including the chest x-ray to rule out pneumonia and catheterized urine sample to look for occult urinary tract infection. Electrolytes will be checked. Old records are reviewed and she did indeed have renal insufficiency and hypernatremia at her last hospital admission. She also had bleeding site that was identified by flexible sigmoidoscopy and cauterized.        Dione Booze, MD 10/27/11 [phone]

## 2011-10-26 NOTE — ED Notes (Signed)
Family at bedside. 

## 2011-10-27 ENCOUNTER — Encounter (HOSPITAL_COMMUNITY): Payer: Self-pay | Admitting: Radiology

## 2011-10-27 ENCOUNTER — Inpatient Hospital Stay (HOSPITAL_COMMUNITY): Payer: PRIVATE HEALTH INSURANCE

## 2011-10-27 DIAGNOSIS — N179 Acute kidney failure, unspecified: Secondary | ICD-10-CM

## 2011-10-27 DIAGNOSIS — E782 Mixed hyperlipidemia: Secondary | ICD-10-CM

## 2011-10-27 DIAGNOSIS — R197 Diarrhea, unspecified: Secondary | ICD-10-CM

## 2011-10-27 DIAGNOSIS — I509 Heart failure, unspecified: Secondary | ICD-10-CM

## 2011-10-27 LAB — DIFFERENTIAL
Eosinophils Absolute: 0 10*3/uL (ref 0.0–0.7)
Eosinophils Relative: 0 % (ref 0–5)
Lymphs Abs: 0.6 10*3/uL — ABNORMAL LOW (ref 0.7–4.0)
Monocytes Absolute: 0.5 10*3/uL (ref 0.1–1.0)

## 2011-10-27 LAB — CBC
HCT: 40.4 % (ref 36.0–46.0)
MCH: 27.6 pg (ref 26.0–34.0)
MCV: 85.2 fL (ref 78.0–100.0)
MCV: 86.1 fL (ref 78.0–100.0)
Platelets: 49 10*3/uL — ABNORMAL LOW (ref 150–400)
Platelets: 23 10*3/uL — CL (ref 150–400)
RBC: 4.74 MIL/uL (ref 3.87–5.11)
RDW: 21 % — ABNORMAL HIGH (ref 11.5–15.5)
RDW: 21.4 % — ABNORMAL HIGH (ref 11.5–15.5)
WBC: 14.8 10*3/uL — ABNORMAL HIGH (ref 4.0–10.5)

## 2011-10-27 LAB — BASIC METABOLIC PANEL
CO2: 18 mEq/L — ABNORMAL LOW (ref 19–32)
Calcium: 8.7 mg/dL (ref 8.4–10.5)
Calcium: 8.8 mg/dL (ref 8.4–10.5)
Chloride: 98 mEq/L (ref 96–112)
Creatinine, Ser: 2.16 mg/dL — ABNORMAL HIGH (ref 0.50–1.10)
Creatinine, Ser: 2.07 mg/dL — ABNORMAL HIGH (ref 0.50–1.10)
GFR calc Af Amer: 24 mL/min — ABNORMAL LOW (ref >90)
GFR calc non Af Amer: 21 mL/min — ABNORMAL LOW (ref >90)
Glucose, Bld: 173 mg/dL — ABNORMAL HIGH (ref 70–99)

## 2011-10-27 LAB — BLOOD GAS, ARTERIAL
Delivery systems: POSITIVE
Drawn by: 277551
Expiratory PAP: 6
Mode: POSITIVE
Patient temperature: 98.6
pCO2 arterial: 25.6 mmHg — ABNORMAL LOW (ref 35.0–45.0)
pH, Arterial: 7.419 — ABNORMAL HIGH (ref 7.350–7.400)

## 2011-10-27 LAB — GLUCOSE, CAPILLARY: Glucose-Capillary: 139 mg/dL — ABNORMAL HIGH (ref 70–99)

## 2011-10-27 LAB — POCT I-STAT 3, ART BLOOD GAS (G3+)
O2 Saturation: 98 %
TCO2: 15 mmol/L (ref 0–100)

## 2011-10-27 LAB — PROCALCITONIN: Procalcitonin: 0.72 ng/mL

## 2011-10-27 MED ORDER — IPRATROPIUM BROMIDE 0.02 % IN SOLN: 0.5000 mg | Freq: Three times a day (TID) | RESPIRATORY_TRACT | Status: DC

## 2011-10-27 MED ORDER — SODIUM CHLORIDE 0.9 % IV BOLUS (SEPSIS)
250.0000 mL | Freq: Once | INTRAVENOUS | Status: AC
  Administered 2011-10-27: 250 mL via INTRAVENOUS

## 2011-10-27 MED ORDER — METOPROLOL TARTRATE 12.5 MG HALF TABLET
12.5000 mg | ORAL_TABLET | Freq: Two times a day (BID) | ORAL | Status: DC
  Administered 2011-10-28 – 2011-10-29 (×3): 12.5 mg via ORAL
  Filled 2011-10-27 (×10): qty 1

## 2011-10-27 MED ORDER — ALBUTEROL SULFATE (5 MG/ML) 0.5% IN NEBU
2.5000 mg | INHALATION_SOLUTION | Freq: Four times a day (QID) | RESPIRATORY_TRACT | Status: DC
  Administered 2011-10-27 – 2011-10-28 (×5): 2.5 mg via RESPIRATORY_TRACT
  Filled 2011-10-27 (×7): qty 0.5

## 2011-10-27 MED ORDER — DOPAMINE-DEXTROSE 3.2-5 MG/ML-% IV SOLN: 5.0000 ug/kg/min | INTRAVENOUS | Status: DC

## 2011-10-27 MED ORDER — FLUCONAZOLE 100MG IVPB
100.0000 mg | INTRAVENOUS | Status: DC
  Administered 2011-10-27 – 2011-10-29 (×3): 100 mg via INTRAVENOUS
  Filled 2011-10-27 (×4): qty 50

## 2011-10-27 MED ORDER — INSULIN ASPART 100 UNIT/ML ~~LOC~~ SOLN
0.0000 [IU] | Freq: Three times a day (TID) | SUBCUTANEOUS | Status: DC
  Administered 2011-10-29 (×2): 3 [IU] via SUBCUTANEOUS
  Administered 2011-10-29: 2 [IU] via SUBCUTANEOUS
  Administered 2011-10-30: 3 [IU] via SUBCUTANEOUS
  Administered 2011-10-30: 2 [IU] via SUBCUTANEOUS

## 2011-10-27 MED ORDER — LEVOFLOXACIN IN D5W 500 MG/100ML IV SOLN
500.0000 mg | Freq: Once | INTRAVENOUS | Status: AC
  Administered 2011-10-27: 500 mg via INTRAVENOUS
  Filled 2011-10-27: qty 100

## 2011-10-27 MED ORDER — IPRATROPIUM BROMIDE 0.02 % IN SOLN
0.5000 mg | Freq: Four times a day (QID) | RESPIRATORY_TRACT | Status: DC
  Administered 2011-10-27 – 2011-10-28 (×5): 0.5 mg via RESPIRATORY_TRACT
  Filled 2011-10-27 (×7): qty 2.5

## 2011-10-27 MED ORDER — FLUTICASONE PROPIONATE 50 MCG/ACT NA SUSP
1.0000 | Freq: Every day | NASAL | Status: DC
  Administered 2011-10-29 – 2011-10-30 (×2): 1 via NASAL
  Filled 2011-10-27: qty 16

## 2011-10-27 MED ORDER — VANCOMYCIN HCL IN DEXTROSE 1-5 GM/200ML-% IV SOLN
1000.0000 mg | Freq: Once | INTRAVENOUS | Status: AC
  Administered 2011-10-27: 1000 mg via INTRAVENOUS
  Filled 2011-10-27: qty 200

## 2011-10-27 MED ORDER — ONDANSETRON HCL 4 MG PO TABS: 4.0000 mg | ORAL_TABLET | Freq: Four times a day (QID) | ORAL | Status: DC | PRN

## 2011-10-27 MED ORDER — INSULIN GLARGINE 100 UNIT/ML ~~LOC~~ SOLN: 5.0000 [IU] | Freq: Every day | SUBCUTANEOUS | Status: DC

## 2011-10-27 MED ORDER — CEFEPIME HCL 1 G IJ SOLR
1.0000 g | Freq: Once | INTRAMUSCULAR | Status: AC
  Administered 2011-10-27: 1 g via INTRAVENOUS
  Filled 2011-10-27: qty 1

## 2011-10-27 MED ORDER — ONDANSETRON HCL 4 MG/2ML IJ SOLN: 4.0000 mg | Freq: Four times a day (QID) | INTRAMUSCULAR | Status: DC | PRN

## 2011-10-27 MED ORDER — INSULIN DETEMIR 100 UNIT/ML ~~LOC~~ SOLN
5.0000 [IU] | Freq: Every day | SUBCUTANEOUS | Status: DC
  Administered 2011-10-27 – 2011-10-29 (×3): 5 [IU] via SUBCUTANEOUS
  Filled 2011-10-27 (×2): qty 10

## 2011-10-27 MED ORDER — SODIUM CHLORIDE 0.9 % IJ SOLN
3.0000 mL | Freq: Two times a day (BID) | INTRAMUSCULAR | Status: DC
  Administered 2011-10-27 – 2011-11-01 (×10): 3 mL via INTRAVENOUS

## 2011-10-27 MED ORDER — DEXTROSE 5 % IV SOLN
1.0000 g | INTRAVENOUS | Status: DC
  Administered 2011-10-27 – 2011-10-28 (×2): 1 g via INTRAVENOUS
  Filled 2011-10-27 (×4): qty 1

## 2011-10-27 MED ORDER — LEVOFLOXACIN IN D5W 250 MG/50ML IV SOLN
250.0000 mg | INTRAVENOUS | Status: DC
  Filled 2011-10-27: qty 50

## 2011-10-27 MED ORDER — ACETAMINOPHEN 325 MG PO TABS
650.0000 mg | ORAL_TABLET | Freq: Four times a day (QID) | ORAL | Status: DC | PRN
  Administered 2011-10-30: 650 mg via ORAL
  Filled 2011-10-27: qty 2

## 2011-10-27 MED ORDER — DOPAMINE-DEXTROSE 3.2-5 MG/ML-% IV SOLN
5.0000 ug/kg/min | INTRAVENOUS | Status: DC
  Administered 2011-10-27: 5 ug/kg/min via INTRAVENOUS
  Filled 2011-10-27: qty 250

## 2011-10-27 MED ORDER — ATORVASTATIN CALCIUM 20 MG PO TABS
20.0000 mg | ORAL_TABLET | Freq: Every day | ORAL | Status: DC
  Administered 2011-10-28 – 2011-10-29 (×2): 20 mg via ORAL
  Filled 2011-10-27 (×5): qty 1

## 2011-10-27 MED ORDER — GLUCERNA SHAKE PO LIQD: 237.0000 mL | Freq: Two times a day (BID) | ORAL | Status: DC | PRN

## 2011-10-27 MED ORDER — VANCOMYCIN HCL IN DEXTROSE 1-5 GM/200ML-% IV SOLN
1000.0000 mg | INTRAVENOUS | Status: DC
  Administered 2011-10-28: 1000 mg via INTRAVENOUS
  Filled 2011-10-27 (×3): qty 200

## 2011-10-27 MED ORDER — ACETAMINOPHEN 650 MG RE SUPP
650.0000 mg | Freq: Four times a day (QID) | RECTAL | Status: DC | PRN
  Administered 2011-10-27: 650 mg via RECTAL
  Filled 2011-10-27: qty 1

## 2011-10-27 MED ORDER — FAMOTIDINE 20 MG PO TABS
20.0000 mg | ORAL_TABLET | Freq: Every day | ORAL | Status: DC
  Administered 2011-10-28 – 2011-10-29 (×2): 20 mg via ORAL
  Filled 2011-10-27 (×5): qty 1

## 2011-10-27 MED ORDER — DOPAMINE-DEXTROSE 3.2-5 MG/ML-% IV SOLN: 3.0000 ug/kg/min | INTRAVENOUS | Status: DC

## 2011-10-27 MED ORDER — ALBUTEROL SULFATE (5 MG/ML) 0.5% IN NEBU
2.5000 mg | INHALATION_SOLUTION | RESPIRATORY_TRACT | Status: DC | PRN
  Administered 2011-10-29: 2.5 mg via RESPIRATORY_TRACT
  Filled 2011-10-27: qty 0.5

## 2011-10-27 MED ORDER — MORPHINE SULFATE 2 MG/ML IJ SOLN
1.0000 mg | INTRAMUSCULAR | Status: DC | PRN
  Administered 2011-10-27 – 2011-10-28 (×4): 2 mg via INTRAVENOUS
  Administered 2011-10-29: 1 mg via INTRAVENOUS
  Administered 2011-10-29: 2 mg via INTRAVENOUS
  Administered 2011-10-30: 1 mg via INTRAVENOUS
  Administered 2011-10-30 – 2011-11-01 (×5): 2 mg via INTRAVENOUS
  Filled 2011-10-27 (×13): qty 1

## 2011-10-27 NOTE — Progress Notes (Signed)
HR sustaining 110-125's MD notified new orders given. Will continue to monitor for further changes.

## 2011-10-27 NOTE — Progress Notes (Signed)
SLP Cancellation Note  Evaluation cancelled today due to medical issues with patient which prohibited therapy.  Patient lethargic and remains on BiPap. Will f/u for readiness for swallow evaluation.   Ferdinand Lango MA, CCC-SLP [phone]   Ferdinand Lango Meryl 10/27/2011, 8:49 AM

## 2011-10-27 NOTE — Progress Notes (Signed)
ANTIBIOTIC CONSULT NOTE - FOLLOW UP  Pharmacy Consult for Levofloxacin Indication:  R/O Colitis  Allergies  Allergen Reactions  . Avelox (Moxifloxacin Hcl In Nacl) Other (See Comments)    unknown  . Proton Pump Inhibitors Other (See Comments)    Thrombocytopenia  . Warfarin Sodium Other (See Comments)    Causes severe bleeding    Patient Measurements: Height: 5\' 3"  (160 cm) Weight: 165 lb 9.1 oz (75.1 kg) IBW/kg (Calculated) : 52.4  Adjusted Body Weight:   Vital Signs: Temp: 98.1 F (36.7 C) (06/11 0803) Temp src: Axillary (06/11 0803) BP: 103/44 mmHg (06/11 0803) Pulse Rate: 84  (06/11 1145) Intake/Output from previous day: 06/10 0701 - 06/11 0700 In: -  Out: 410 [Urine:410] Intake/Output from this shift: Total I/O In: -  Out: 825 [Urine:75; Stool:750]  Labs:  [facility] 10/27/11 1010 10/27/11 0300 10/26/11 2213 10/26/11 1848  WBC 14.8* 5.7 -- 6.5  HGB 13.0 13.1 -- 13.1  PLT 23* 49* -- 89*  LABCREA -- -- -- --  CREATININE 2.07* 2.16* 2.12* --   Estimated Creatinine Clearance: 20 ml/min (by C-G formula based on Cr of 2.07). No results found for this basename: VANCOTROUGH:2,VANCOPEAK:2,VANCORANDOM:2,GENTTROUGH:2,GENTPEAK:2,GENTRANDOM:2,TOBRATROUGH:2,TOBRAPEAK:2,TOBRARND:2,AMIKACINPEAK:2,AMIKACINTROU:2,AMIKACIN:2, in the last 72 hours   Microbiology: Recent Results (from the past 720 hour(s))  MRSA PCR SCREENING     Status: Normal   Collection Time   10/27/11 12:47 AM      Component Value Range Status Comment   MRSA by PCR NEGATIVE  NEGATIVE  Final     Anti-infectives     Start     Dose/Rate Route Frequency Ordered Stop   10/29/11 0000   vancomycin (VANCOCIN) IVPB 1000 mg/200 mL premix        1,000 mg 200 mL/hr over 60 Minutes Intravenous Every 48 hours 10/27/11 0111     10/27/11 2200   ceFEPIme (MAXIPIME) 1 g in dextrose 5 % 50 mL IVPB        1 g 100 mL/hr over 30 Minutes Intravenous Every 24 hours 10/27/11 0111     10/27/11 0115   vancomycin (VANCOCIN)  IVPB 1000 mg/200 mL premix        1,000 mg 200 mL/hr over 60 Minutes Intravenous  Once 10/27/11 0111 10/27/11 0309   10/27/11 0115   ceFEPIme (MAXIPIME) 1 g in dextrose 5 % 50 mL IVPB        1 g 100 mL/hr over 30 Minutes Intravenous  Once 10/27/11 0111 10/27/11 0207   10/27/11 0053   fluconazole (DIFLUCAN) IVPB 100 mg        100 mg 50 mL/hr over 60 Minutes Intravenous Every 24 hours 10/27/11 0053            Assessment: 78yo female being treated with Vancomycin, Cefepime, and Fluconazole for presumed urosepsis.  Levaquin to be added with CT abdomen revealing edema of the colon and concern for possible colitis.  Estimated CrCl is ~71ml/min  Goal of Therapy:  Resolution of infection  Plan:  1.  Levofloxacin 500mg  IV x 1, then 250mg  IV q48 2.  Watch renal function, adjust dosing as needed 3.  F/U culture results  Marisue Humble, PharmD Clinical Pharmacist [facility]

## 2011-10-27 NOTE — Consult Note (Signed)
Subjective:  On Bipap. Possible pneumonia. Chronic left heart systolic failure, now with leg edema.  Objective:  Vital Signs in the last 24 hours: Temp:  [98.1 F (36.7 C)-98.2 F (36.8 C)] 98.1 F (36.7 C) (06/11 0803) Pulse Rate:  [69-87] 80  (06/11 0841) Cardiac Rhythm:  [-] Normal sinus rhythm (06/11 0400) Resp:  [13-38] 22  (06/11 0841) BP: (103-168)/(44-92) 103/44 mmHg (06/11 0803) SpO2:  [82 %-100 %] 98 % (06/11 0841) FiO2 (%):  [40 %] 40 % (06/11 0140) Weight:  [75.1 kg (165 lb 9.1 oz)] 75.1 kg (165 lb 9.1 oz) (06/11 0045)  Physical Exam: BP Readings from Last 1 Encounters:  10/27/11 103/44    Wt Readings from Last 1 Encounters:  10/27/11 75.1 kg (165 lb 9.1 oz)    Weight change:   HEENT: [place]/AT, Eyes-Brown, PERL, EOMI, Conjunctiva-Pink, Sclera-Non-icteric Neck: Full JVD at 0 degree angle, + bruit, Trachea midline. Lungs:  Clearing, Bilateral. Cardiac:  Regular rhythm, normal S1 and S2, + S3.  Abdomen:  Soft, non-tender. Extremities:  2 + edema present. No cyanosis. No clubbing. CNS: AxOx0, Cranial nerves grossly intact, moves all 4 extremities. Right handed. Skin: Warm and dry.   Intake/Output from previous day: 06/10 0701 - 06/11 0700 In: -  Out: 410 [Urine:410]    Lab Results: BMET    Component Value Date/Time   NA 131* 10/27/2011 0300   K 5.4* 10/27/2011 0300   CL 98 10/27/2011 0300   CO2 18* 10/27/2011 0300   GLUCOSE 173* 10/27/2011 0300   BUN 74* 10/27/2011 0300   CREATININE 2.16* 10/27/2011 0300   CALCIUM 8.7 10/27/2011 0300   GFRNONAA 20* 10/27/2011 0300   GFRAA 23* 10/27/2011 0300   CBC    Component Value Date/Time   WBC 5.7 10/27/2011 0300   WBC 9.1 08/23/2009 0819   RBC 4.74 10/27/2011 0300   RBC 4.42 08/23/2009 0819   HGB 13.1 10/27/2011 0300   HGB 12.6 08/23/2009 0819   HCT 40.4 10/27/2011 0300   HCT 38.4 08/23/2009 0819   PLT 49* 10/27/2011 0300   PLT 243 08/23/2009 0819   MCV 85.2 10/27/2011 0300   MCV 86.8 08/23/2009 0819   MCH 27.6 10/27/2011 0300   MCH 28.4 08/23/2009 0819   MCHC 32.4 10/27/2011 0300   MCHC 32.7 08/23/2009 0819   RDW 21.0* 10/27/2011 0300   RDW 16.1* 08/23/2009 0819   LYMPHSABS 0.6* 10/27/2011 0300   LYMPHSABS 1.4 08/23/2009 0819   MONOABS 0.5 10/27/2011 0300   MONOABS 0.7 08/23/2009 0819   EOSABS 0.0 10/27/2011 0300   EOSABS 0.3 08/23/2009 0819   BASOSABS 0.0 10/27/2011 0300   BASOSABS 0.0 08/23/2009 0819   CARDIAC ENZYMES Lab Results  Component Value Date   CKTOTAL 52 10/23/2011   CKMB 4.0 10/23/2011   TROPONINI <0.30 10/23/2011    Assessment/Plan:  Patient Active Hospital Problem List: Acute on chronic left heart systolic failure Use dopamine to reduce lasix use. Family(daughters) in agreement for trial of dopamine x 1-2 days. Encephalopathy (10/26/2011) Per primary care Acute renal failure (06/25/2011) Stable Diabetes mellitus (06/20/2011) Stable UTI (lower urinary tract infection) (10/22/2011) On antibiotic Hyperkalemia (10/26/2011) Stable   LOS: 1 day    Orpah Cobb  MD  10/27/2011, 10:07 AM

## 2011-10-27 NOTE — Progress Notes (Signed)
Utilization review completed.  

## 2011-10-27 NOTE — Progress Notes (Addendum)
HR sustaining 120's. BP 130/81 MAP 91, pt asleep.  Dr. Algie Coffer notifed, new orders given for titration. 2mcgs/kg/min.   Will continue to monitor for further changes

## 2011-10-27 NOTE — Progress Notes (Signed)
ANTIBIOTIC CONSULT NOTE - INITIAL  Pharmacy Consult for vancomycin/cefepime Indication: encephalopathy due to UTI  Allergies  Allergen Reactions  . Avelox (Moxifloxacin Hcl In Nacl) Other (See Comments)    unknown  . Proton Pump Inhibitors Other (See Comments)    Thrombocytopenia  . Warfarin Sodium Other (See Comments)    Causes severe bleeding    Patient Measurements: Height: 5\' 3"  (160 cm) Weight: 165 lb 9.1 oz (75.1 kg) IBW/kg (Calculated) : 52.4   Vital Signs: Temp: 98.2 F (36.8 C) (06/11 0045) Temp src: Axillary (06/11 0045) BP: 137/74 mmHg (06/11 0045) Pulse Rate: 79  (06/11 0007)  Labs:  Basename 10/26/11 2213 10/26/11 1848  WBC -- 6.5  HGB -- 13.1  PLT -- 89*  LABCREA -- --  CREATININE 2.12* 2.03*   Estimated Creatinine Clearance: 19.5 ml/min (by C-G formula based on Cr of 2.12).  Microbiology: No results found for this or any previous visit (from the past 720 hour(s)).  Medical History: Past Medical History  Diagnosis Date  . Diabetes mellitus   . Hypertension   . Vascular disease   . Shortness of breath   . Bladder infection   . COPD (chronic obstructive pulmonary disease)   . Myocardial infarction   . Stroke   . GERD (gastroesophageal reflux disease)   . Diverticulitis   . CHF (congestive heart failure)   . A-fib   . DVT (deep venous thrombosis)     Medications:  Prescriptions prior to admission  Medication Sig Dispense Refill  . acetaminophen (TYLENOL) 500 MG tablet Take 1,000 mg by mouth every 6 (six) hours as needed. For pain      . albuterol (PROVENTIL) (5 MG/ML) 0.5% nebulizer solution Take 0.5 mLs (2.5 mg total) by nebulization 3 (three) times daily.  20 mL  0  . albuterol-ipratropium (COMBIVENT) 18-103 MCG/ACT inhaler Inhale 2 puffs into the lungs every 6 (six) hours as needed. For shortness of breath      . ALPRAZolam (XANAX) 0.25 MG tablet Take 0.25 mg by mouth 3 (three) times daily as needed. anxiety      . Ascorbic Acid (VITAMIN  C) 100 MG tablet Take 100 mg by mouth daily.      Marland Kitchen atorvastatin (LIPITOR) 20 MG tablet Take 20 mg by mouth at bedtime.      . cephALEXin (KEFLEX) 500 MG capsule Take 1 capsule (500 mg total) by mouth 2 (two) times daily. x3days  6 capsule  0  . digoxin ([name]) 0.125 MG tablet Take 125 mcg by mouth every other day.      . famotidine (PEPCID) 20 MG tablet Take 20 mg by mouth at bedtime.      . furosemide (LASIX) 40 MG tablet Take 40 mg by mouth daily.       . insulin detemir (LEVEMIR) 100 UNIT/ML injection Inject 0-15 Units into the skin See admin instructions. Patient on sliding scale in the morning and takes 15 units at bedtime      . ipratropium (ATROVENT) 0.02 % nebulizer solution Take 2.5 mLs (0.5 mg total) by nebulization 3 (three) times daily.  75 mL  0  . Iron-Vitamins (GERITOL COMPLETE PO) Take 2 tablets by mouth daily.      . metoprolol tartrate (LOPRESSOR) 25 MG tablet Take 12.5 mg by mouth 2 (two) times daily.      . mometasone (NASONEX) 50 MCG/ACT nasal spray Place 2 sprays into the nose at bedtime as needed. For congestion      .  Multiple Vitamins-Minerals (ICAPS) CAPS Take 1 capsule by mouth 2 (two) times daily.       Scheduled:    . albuterol  2.5 mg Nebulization Q6H  . atorvastatin  20 mg Oral QHS  . calcium gluconate  1 g Intravenous Once  . ceFEPime (MAXIPIME) IV  1 g Intravenous Once  . ceFEPime (MAXIPIME) IV  1 g Intravenous Q24H  . dextrose  50 mL Intravenous Once  . famotidine  20 mg Oral QHS  . fluconazole (DIFLUCAN) IV  100 mg Intravenous Q24H  . fluticasone  1 spray Each Nare Daily  . insulin aspart  0-9 Units Subcutaneous TID WC  . insulin aspart  10 Units Intravenous Once  . insulin glargine  5 Units Subcutaneous QHS  . ipratropium  0.5 mg Nebulization Q6H  . metoprolol tartrate  12.5 mg Oral BID  . sodium bicarbonate  50 mEq Intravenous Once  . sodium chloride  3 mL Intravenous Q12H  . sodium polystyrene  30 g Oral Once  . vancomycin  1,000 mg Intravenous  Once  . vancomycin  1,000 mg Intravenous Q48H  . DISCONTD: ipratropium  0.5 mg Nebulization TID   Assessment: 78yo female brought to ED by family for increasing lethargy and AMS, encephalopathic presumed due to UTI, also in ARF, to begin IV ABX.  Goal of Therapy:  Vancomycin trough level 15-20 mcg/ml (will decrease if UTI isolated)  Plan:  Will begin vancomycin 1g IV Q48H and cefepime 1g IV Q24H and monitor CBC, Cx, levels prn.  Colleen Can PharmD BCPS 10/27/2011,1:11 AM

## 2011-10-27 NOTE — Progress Notes (Signed)
INITIAL ADULT NUTRITION ASSESSMENT Date: 10/27/2011   Time: 2:15 PM  Reason for Assessment: Nutrition Risk Report  ASSESSMENT: [name]  Dx: Encephalopathy  Hx:  Past Medical History  Diagnosis Date  . Diabetes mellitus   . Hypertension   . Vascular disease   . Shortness of breath   . Bladder infection   . COPD (chronic obstructive pulmonary disease)   . Myocardial infarction   . Stroke   . GERD (gastroesophageal reflux disease)   . Diverticulitis   . CHF (congestive heart failure)   . A-fib   . DVT (deep venous thrombosis)     Related Meds:     . albuterol  2.5 mg Nebulization Q6H  . atorvastatin  20 mg Oral QHS  . calcium gluconate  1 g Intravenous Once  . ceFEPime (MAXIPIME) IV  1 g Intravenous Once  . ceFEPime (MAXIPIME) IV  1 g Intravenous Q24H  . dextrose  50 mL Intravenous Once  . famotidine  20 mg Oral QHS  . fluconazole (DIFLUCAN) IV  100 mg Intravenous Q24H  . fluticasone  1 spray Each Nare Daily  . insulin aspart  0-9 Units Subcutaneous TID WC  . insulin aspart  10 Units Intravenous Once  . insulin detemir  5 Units Subcutaneous QHS  . ipratropium  0.5 mg Nebulization Q6H  . levofloxacin (LEVAQUIN) IV  250 mg Intravenous Q48H  . levofloxacin (LEVAQUIN) IV  500 mg Intravenous Once  . metoprolol tartrate  12.5 mg Oral BID  . sodium bicarbonate  50 mEq Intravenous Once  . sodium chloride  250 mL Intravenous Once  . sodium chloride  3 mL Intravenous Q12H  . sodium polystyrene  30 g Oral Once  . vancomycin  1,000 mg Intravenous Once  . vancomycin  1,000 mg Intravenous Q48H  . DISCONTD: insulin glargine  5 Units Subcutaneous QHS  . DISCONTD: ipratropium  0.5 mg Nebulization TID    Ht: 5\' 3"  (160 cm)  Wt: 165 lb 9.1 oz (75.1 kg)  Ideal Wt: 52.2 kg % Ideal Wt: 144%  Usual Wt: unable to obtain % Usual Wt: ---  Body mass index is 29.33 kg/(m^2).  Food/Nutrition Related Hx: unintentional weight loss > 10 lbs within the last month & problems  chewing or swallowing foods and/or liquids per admission nutrition screen  Labs:  CMP     Component Value Date/Time   NA 134* 10/27/2011 1010   K 5.4* 10/27/2011 1010   CL 100 10/27/2011 1010   CO2 18* 10/27/2011 1010   GLUCOSE 133* 10/27/2011 1010   BUN 73* 10/27/2011 1010   CREATININE 2.07* 10/27/2011 1010   CALCIUM 8.8 10/27/2011 1010   PROT 6.6 10/26/2011 1848   ALBUMIN 2.7* 10/26/2011 1848   AST 52* 10/26/2011 1848   ALT 31 10/26/2011 1848   ALKPHOS 142* 10/26/2011 1848   BILITOT 0.7 10/26/2011 1848   GFRNONAA 21* 10/27/2011 1010   GFRAA 24* 10/27/2011 1010     Intake/Output Summary (Last 24 hours) at 10/27/11 1419 Last data filed at 10/27/11 0900  Gross per 24 hour  Intake      0 ml  Output   1235 ml  Net  -1235 ml    CBG (last 3)   Basename 10/27/11 1243 10/27/11 1010  GLUCAP 112* 139*    Diet Order: Carb Control  Supplements/Tube Feeding: N/A  IVF:    DOPamine Last Rate: 5 mcg/kg/min (10/27/11 1256)  DISCONTD: DOPamine     Estimated Nutritional Needs:  Kcal: 1600-1800 Protein: 80-90 gm Fluid: 1.6-1.8 L  Patient admitted after family found her to be increasingly confused and lethargic; patient also had increased coughing; RD unable to obtain nutrition hx from patient -- on BiPAP upon RD visitation; spoke briefly with patient's daughter -- states the patient does well on "purees;" does not know if she's lost weight recently; swallow evaluation ordered -- unable to complete today; CWOCN reviewed 6/11 for colostomy & chronic full thickness post op wound; per chart review, very poor prognosis  NUTRITION DIAGNOSIS: -Increased nutrient needs (NI-5.1).  Status: Ongoing  RELATED TO: wound healing  AS EVIDENCE BY: estimated nutrition needs  MONITORING/EVALUATION(Goals): Goal: Oral intake to meet >90% of estimated nutrition needs Monitor: PO intake, weight, labs, I/O's  EDUCATION NEEDS: -No education needs identified at this time  INTERVENTION:  Add Glucerna  Shake 2 time daily PRN (220 kcals, 9.9 gm protein per 8 fl oz bottle)  RD to follow for nutrition care plan  Dietitian #: [phone]  DOCUMENTATION CODES Per approved criteria  -Not Applicable    Alger Memos 10/27/2011, 2:15 PM

## 2011-10-27 NOTE — Consult Note (Signed)
WOC ostomy consult  Stoma type/location: Left upper quadrant colostomy, has had since February. Stomal assessment/size: 1 3/4 inch stoma, pink, budded Peristomal assessment: Intact  Treatment options for stomal/peristomal skin: none Output  Light brownish green liquidy stool noted in bag approx 200 cc Ostomy pouching: 1pc Karaya pouch  Education provided: Family at bedside and independent of care of colostomy.  Has been completing changes at home until this admission..  Patient has had Kayexalate this admission but none in the last 12 hours per bedside nurse.  Large amount of liquid brown no blood noted to green stool noted in bag.  Discussed previous admission and care regarding colostomy.  Supplies at bedside for use by bedside nurse.  WOC consult Note Reason for Consult: s/p surgical incision for colon resection in February. Wound type: Chronic full thickness post op wound Measurement: 6.0 x 1.0 x 4.0 tract noted at 11 o'clock at 3.0 cm Wound bed: Moist, 95% red granulating tissue, 5% yellow tissue. Drainage (amount, consistency, odor) small amount pinkish colored drainage noted from wound.  No foul odor noted.   Periwound: Intact without redness Dressing procedure/placement/frequency: Bedside nurse to apply wound vac at 125 mmHG.  Guaze place to wound temporarily until until unit ordered.   Previous orders to discontinue wound vac on Friday upon discharge by [facility] team.  However, order written to continue wound vac on 10/24/11 by [facility].  Patient was on [employer] unit for home use. Machine at bedside, advised to take home as patient would use hospital unit for treatment.  Bedside nurse plans to apply when supplies at bedside.  Nefeteria Jeter RN,BSN, WOC Nurse/Will not plan to follow further unless re-consulted.  [address] RN, MSN, [employer]  [phone]

## 2011-10-27 NOTE — Progress Notes (Addendum)
[facility]  Interim history: 78 y/o female s/p colectomy and colostomy for a diverticular bleed. She was admitted on 6/5 for weakness and decreased PO intake and found to be dehydrated- Cr 1.7 (baseline 1.4) (on Lasix). She had a few episodes of bleeding per rectum after the colectomy. After being given a Fleets enema on 6/7, she had blood stool. Sigmoidoscopy done on 6/7 by Dr Elnoria Howard revealed AVMs and a rectal erosion. He preformed an APC- I am not able to find the report from the procedure.  She was also treated for a UTI and discharged back home with Lasix.   She returns with lethargy and is found to have ARF, UTI, metabolic and lactic acidosis, hyperkalemia, acute severe thrombocytopenia. CT abd/pelvis reveals diffuse anasarca. Exam reveals anasarca as well.  I have had a discussion with both of her daughters who are worried about her cough and the fact that despite them "pushing fluids on her" at home and giving her lasix, she continued to become more swollen and had very little urine.   Antibiotics: Cefepime, Fluconazole, Vancomycin  Subjective: More lethargic this AM- but later awakens and tries to pull bipap off. Not speaking- will not tell me her name. Following a few commands.   Objective: Blood pressure 103/44, pulse 80, temperature 98.1 F (36.7 C), temperature source Axillary, resp. rate 22, height 5\' 3"  (1.6 m), weight 75.1 kg (165 lb 9.1 oz), SpO2 98.00%. Weight change:   Intake/Output Summary (Last 24 hours) at 10/27/11 0916 Last data filed at 10/27/11 0805  Gross per 24 hour  Intake      0 ml  Output    485 ml  Net   -485 ml    Physical Exam: General appearance: obtunded Lungs: rhonchi bilaterally Heart: regular rate and rhythm, S1, S2 normal, murmur at apex 2/6 Abdomen: colostomy in place, nondistended, bs present. Brown stool in colostomy bag Extremities: 3 + pedal edema  Lab Results:  Basename 10/27/11 0300 10/26/11 2213  NA 131* 130*  K 5.4* 5.6*  CL  98 95*  CO2 18* 17*  GLUCOSE 173* 258*  BUN 74* 73*  CREATININE 2.16* 2.12*  CALCIUM 8.7 8.6  MG -- --  PHOS -- --    Basename 10/26/11 1848  AST 52*  ALT 31  ALKPHOS 142*  BILITOT 0.7  PROT 6.6  ALBUMIN 2.7*   No results found for this basename: LIPASE:2,AMYLASE:2 in the last 72 hours  Basename 10/27/11 0300 10/26/11 1848  WBC 5.7 6.5  NEUTROABS 4.6 4.9  HGB 13.1 13.1  HCT 40.4 41.5  MCV 85.2 86.1  PLT 49* 89*   No results found for this basename: CKTOTAL:3,CKMB:3,CKMBINDEX:3,TROPONINI:3 in the last 72 hours No components found with this basename: POCBNP:3 No results found for this basename: DDIMER:2 in the last 72 hours No results found for this basename: HGBA1C:2 in the last 72 hours No results found for this basename: CHOL:2,HDL:2,LDLCALC:2,TRIG:2,CHOLHDL:2,LDLDIRECT:2 in the last 72 hours No results found for this basename: TSH,T4TOTAL,FREET3,T3FREE,THYROIDAB in the last 72 hours No results found for this basename: VITAMINB12:2,FOLATE:2,FERRITIN:2,TIBC:2,IRON:2,RETICCTPCT:2 in the last 72 hours  Micro Results: Recent Results (from the past 240 hour(s))  MRSA PCR SCREENING     Status: Normal   Collection Time   10/27/11 12:47 AM      Component Value Range Status Comment   MRSA by PCR NEGATIVE  NEGATIVE  Final     Studies/Results: Ct Abdomen Pelvis Wo Contrast  10/27/2011  *RADIOLOGY REPORT*  Clinical Data: Abdominal pain  CT  ABDOMEN AND PELVIS WITHOUT CONTRAST  Technique:  Multidetector CT imaging of the abdomen and pelvis was performed following the standard protocol without intravenous contrast.  Comparison: 10/22/2011  Findings: Bilateral pleural effusions with associated consolidations.  Cardiomegaly.  Coronary artery calcification. Mitral annular calcification and/or replacement. Right breast thickening is again noted.  Diffuse anasarca, increased.  Organ abnormality/lesion detection is limited in the absence of intravenous contrast. The examination is further  limited by streak artifact from the the patient's extremity positioning. Cannot exclude a focal liver lesion.  Unremarkable spleen.  Pancreas poorly visualized.  No appreciable change within the adrenal glands.  No biliary ductal dilatation.  Unchanged left renal cyst.  Lobular renal contours with mild perinephric fat stranding is similar to prior.  No hydronephrosis or hydroureter.  No evidence for small bowel obstruction.  Left lower quadrant colostomy.  There is a thickened segment of colon within the right abdomen (image 51 series 2).  1.4 cm oval soft tissue attenuation within the mid abdomen (image 50).  9 mm short axis round soft tissue attenuation in the left abdomen on image 54.  Foley catheter within a partially decompressed bladder. Circumferential bladder wall thickening is nonspecific.  Absent uterus.  No adnexal mass.  Small to moderate amount of free fluid. No free intraperitoneal air.  There is scattered atherosclerotic calcification of the aorta and its branches. Unchanged noncontrast appearance to the renal arteries.  IVC filter in place.  Multilevel degenerative changes of the imaged spine. No acute or aggressive appearing osseous lesion.  Grade 1 anterolisthesis of L3 on L4.  L4-5 fusion.  IMPRESSION: Bilateral pleural effusions with associated consolidations; atelectasis versus pneumonia.  Cardiomegaly with coronary artery and mitral annular calcifications.  Significantly degraded images of the abdomen due to lack of contrast and streak artifact from extremity positioning.  No bowel obstruction.  Left lower quadrant colostomy. There is a thickened segment of colon within the right abdomen of which nonspecific colitis is not excluded (infectious, inflammatory, and ischemic etiologies).  1.4 cm soft tissue attenuation within the mid abdomen may reflect an enlarged lymph node.  Metastatic disease not excluded.  Diffuse anasarca.  Small moderate amount of free fluid is similar to slightly increased  in the interval.  Original Report Authenticated By: Waneta Martins, M.D.   Dg Chest Port 1 View  10/27/2011  *RADIOLOGY REPORT*  Clinical Data: Shortness of breath.  PORTABLE CHEST - 1 VIEW  Comparison: Chest 10/26/2011.  Findings: Left basilar airspace disease persist but appears mildly improved.  Right lung is clear.  No pneumothorax.  Cardiomegaly.  IMPRESSION: Some improvement in left basilar airspace disease.  Original Report Authenticated By: Bernadene Bell. D'ALESSIO, M.D.   Dg Chest Portable 1 View  10/26/2011  *RADIOLOGY REPORT*  Clinical Data: Altered mental status.  PORTABLE CHEST - 1 VIEW  Comparison: 10/22/2011  Findings: Cardiomegaly accentuated by AP portable technique. Probable small left pleural effusion, decreased.  Decreased or resolved right pleural effusion.  No pneumothorax.  Lung volumes remain low.  Improved to resolved interstitial edema.  Minimal venous congestion suspected.  Improved bibasilar aeration with mild left base air space disease remaining.  IMPRESSION: 1.  Overall improved aeration with decreased to resolved congestive heart failure.  2.  Residual small left pleural effusion with adjacent airspace disease, likely atelectasis.  Original Report Authenticated By: Consuello Bossier, M.D.    Medications: Scheduled Meds:   . albuterol  2.5 mg Nebulization Q6H  . atorvastatin  20 mg Oral QHS  .  calcium gluconate  1 g Intravenous Once  . ceFEPime (MAXIPIME) IV  1 g Intravenous Once  . ceFEPime (MAXIPIME) IV  1 g Intravenous Q24H  . dextrose  50 mL Intravenous Once  . famotidine  20 mg Oral QHS  . fluconazole (DIFLUCAN) IV  100 mg Intravenous Q24H  . fluticasone  1 spray Each Nare Daily  . insulin aspart  0-9 Units Subcutaneous TID WC  . insulin aspart  10 Units Intravenous Once  . insulin glargine  5 Units Subcutaneous QHS  . ipratropium  0.5 mg Nebulization Q6H  . metoprolol tartrate  12.5 mg Oral BID  . sodium bicarbonate  50 mEq Intravenous Once  . sodium  chloride  250 mL Intravenous Once  . sodium chloride  3 mL Intravenous Q12H  . sodium polystyrene  30 g Oral Once  . vancomycin  1,000 mg Intravenous Once  . vancomycin  1,000 mg Intravenous Q48H  . DISCONTD: ipratropium  0.5 mg Nebulization TID   Continuous Infusions:  PRN Meds:.acetaminophen, acetaminophen, albuterol, ondansetron (ZOFRAN) IV, ondansetron  Assessment/Plan:  Severe sepsis possibly from UTI vs respiratory infection Cont antibiotics, f/u cultures, pro-calcitonin. Previous admission urine culture were negative. She received 2-3 days of Rocephin. Add Levquin CT abd/pelvis reveals some edema of the colon- nonspecific colitis? Ischemic colitis? May explain lactic acidosis. Non- tender on exam, no complaint or appearance of pain.  Check swallow eval and start liquids only for now.    *Encephalopathy Due to sepsis, acidosis, ARF. CT head reveals old infarcts.   ARF, dehydration and hyperkalemia Severe- due to lasix, anasarca from third spacing of fluid, poor cardiac output. I have stopped Lasix. Fluid status to be managed by [facility]. Dr Algie Coffer wanting to try Dopamine for 1-2 days.   Metabolic acidosis Due to ARF? Sepsis? and lactic acidosis. BPs have not seemed to be low.  ? Ischemic colitis- NPO, follow.   Edematous Colon As above - possible colitis- infectious vs ischemic but pt is too lethargic to assess for tenderness on exam.   Thrombocytopenia plt dropped from 120s to 49 and now down to 20s. Suspect due to infection/ she has a chronic thrombocytopenia as well.    Recent GI bleeds s/p colectomy for diverticular bleed and then s/p APC of lesion in sigmoid which was thought to be bleeding.  Watch for bleeding with above thrombocytopenia    Code Status: DNR- very poor prognosis Family Communication and disposition:  Spoke with both daughters- discussed possible transition to comfort care -They want to do everything possible especially antibiotics and the "new  medicine" Dr Algie Coffer is going to start.    [facility] [phone] 10/27/2011, 9:16 AM  LOS: 1 day

## 2011-10-28 ENCOUNTER — Inpatient Hospital Stay (HOSPITAL_COMMUNITY): Payer: PRIVATE HEALTH INSURANCE

## 2011-10-28 DIAGNOSIS — N179 Acute kidney failure, unspecified: Secondary | ICD-10-CM

## 2011-10-28 DIAGNOSIS — R197 Diarrhea, unspecified: Secondary | ICD-10-CM

## 2011-10-28 DIAGNOSIS — E782 Mixed hyperlipidemia: Secondary | ICD-10-CM

## 2011-10-28 DIAGNOSIS — I509 Heart failure, unspecified: Secondary | ICD-10-CM

## 2011-10-28 LAB — COMPREHENSIVE METABOLIC PANEL WITH GFR
ALT: 43 U/L — ABNORMAL HIGH (ref 0–35)
AST: 58 U/L — ABNORMAL HIGH (ref 0–37)
Albumin: 2.4 g/dL — ABNORMAL LOW (ref 3.5–5.2)
Alkaline Phosphatase: 128 U/L — ABNORMAL HIGH (ref 39–117)
BUN: 70 mg/dL — ABNORMAL HIGH (ref 6–23)
CO2: 21 meq/L (ref 19–32)
Calcium: 8.6 mg/dL (ref 8.4–10.5)
Chloride: 105 meq/L (ref 96–112)
Creatinine, Ser: 1.72 mg/dL — ABNORMAL HIGH (ref 0.50–1.10)
GFR calc Af Amer: 30 mL/min — ABNORMAL LOW
GFR calc non Af Amer: 26 mL/min — ABNORMAL LOW
Glucose, Bld: 112 mg/dL — ABNORMAL HIGH (ref 70–99)
Potassium: 4.6 meq/L (ref 3.5–5.1)
Sodium: 138 meq/L (ref 135–145)
Total Bilirubin: 0.9 mg/dL (ref 0.3–1.2)
Total Protein: 5.7 g/dL — ABNORMAL LOW (ref 6.0–8.3)

## 2011-10-28 LAB — CBC
HCT: 39.8 % (ref 36.0–46.0)
Hemoglobin: 12.4 g/dL (ref 12.0–15.0)
MCH: 26.8 pg (ref 26.0–34.0)
MCHC: 31.2 g/dL (ref 30.0–36.0)
MCV: 86.1 fL (ref 78.0–100.0)
Platelets: 25 10*3/uL — CL (ref 150–400)
RBC: 4.62 MIL/uL (ref 3.87–5.11)
RDW: 21.5 % — ABNORMAL HIGH (ref 11.5–15.5)
WBC: 7.1 10*3/uL (ref 4.0–10.5)

## 2011-10-28 LAB — GLUCOSE, CAPILLARY: Glucose-Capillary: 153 mg/dL — ABNORMAL HIGH (ref 70–99)

## 2011-10-28 LAB — MAGNESIUM: Magnesium: 2.1 mg/dL (ref 1.5–2.5)

## 2011-10-28 MED ORDER — STARCH (THICKENING) PO POWD
ORAL | Status: DC | PRN
  Administered 2011-10-28: 23:00:00 via ORAL
  Filled 2011-10-28: qty 227

## 2011-10-28 MED ORDER — FUROSEMIDE 10 MG/ML IJ SOLN
20.0000 mg | Freq: Every day | INTRAMUSCULAR | Status: DC
  Administered 2011-10-28: 20 mg via INTRAVENOUS
  Filled 2011-10-28: qty 2

## 2011-10-28 MED ORDER — FUROSEMIDE 10 MG/ML IJ SOLN
40.0000 mg | Freq: Once | INTRAMUSCULAR | Status: AC
  Administered 2011-10-28: 40 mg via INTRAVENOUS
  Filled 2011-10-28: qty 4

## 2011-10-28 NOTE — Progress Notes (Signed)
Subjective:  No new complaints. Arm and leg edema modestly improved. Good urine output yesterday. Non-sustained VT. Awake.  Objective:  Vital Signs in the last 24 hours: Temp:  [97.5 F (36.4 C)-98 F (36.7 C)] 98 F (36.7 C) (06/12 0801) Pulse Rate:  [80-98] 94  (06/12 0400) Cardiac Rhythm:  [-] Sinus tachycardia (06/12 0800) Resp:  [11-22] 11  (06/12 0400) BP: (103-115)/(53-80) 111/55 mmHg (06/12 0400) SpO2:  [93 %-99 %] 96 % (06/12 0739) Weight:  [75.2 kg (165 lb 12.6 oz)] 75.2 kg (165 lb 12.6 oz) (06/12 0440)  Physical Exam: BP Readings from Last 1 Encounters:  10/28/11 111/55    Wt Readings from Last 1 Encounters:  10/28/11 75.2 kg (165 lb 12.6 oz)    Weight change: 0.1 kg (3.5 oz)  HEENT: [place]/AT, Eyes-Brown, PERL, EOMI, Conjunctiva-Pink, Sclera-Non-icteric Neck: Full JVD at 30 degree angle, No bruit, Trachea midline. Lungs:  Clear, Bilateral. Cardiac:  Regular rhythm, normal S1 and S2, no S3.  Abdomen:  Soft, non-tender. Extremities:  2 + edema present. No cyanosis. No clubbing. CNS: AxOx3, Cranial nerves grossly intact, moves all 4 extremities. Right handed. Skin: Warm and dry.   Intake/Output from previous day: 06/11 0701 - 06/12 0700 In: 54.4 [I.V.:54.4] Out: 1825 [Urine:1075; Stool:750]    Lab Results: BMET    Component Value Date/Time   NA 138 10/28/2011 0420   K 4.6 10/28/2011 0420   CL 105 10/28/2011 0420   CO2 21 10/28/2011 0420   GLUCOSE 112* 10/28/2011 0420   BUN 70* 10/28/2011 0420   CREATININE 1.72* 10/28/2011 0420   CALCIUM 8.6 10/28/2011 0420   GFRNONAA 26* 10/28/2011 0420   GFRAA 30* 10/28/2011 0420   CBC    Component Value Date/Time   WBC 7.1 10/28/2011 0420   WBC 9.1 08/23/2009 0819   RBC 4.62 10/28/2011 0420   RBC 4.42 08/23/2009 0819   HGB 12.4 10/28/2011 0420   HGB 12.6 08/23/2009 0819   HCT 39.8 10/28/2011 0420   HCT 38.4 08/23/2009 0819   PLT 25* 10/28/2011 0420   PLT 243 08/23/2009 0819   MCV 86.1 10/28/2011 0420   MCV 86.8 08/23/2009 0819   MCH  26.8 10/28/2011 0420   MCH 28.4 08/23/2009 0819   MCHC 31.2 10/28/2011 0420   MCHC 32.7 08/23/2009 0819   RDW 21.5* 10/28/2011 0420   RDW 16.1* 08/23/2009 0819   LYMPHSABS 0.6* 10/27/2011 0300   LYMPHSABS 1.4 08/23/2009 0819   MONOABS 0.5 10/27/2011 0300   MONOABS 0.7 08/23/2009 0819   EOSABS 0.0 10/27/2011 0300   EOSABS 0.3 08/23/2009 0819   BASOSABS 0.0 10/27/2011 0300   BASOSABS 0.0 08/23/2009 0819   CARDIAC ENZYMES Lab Results  Component Value Date   CKTOTAL 52 10/23/2011   CKMB 4.0 10/23/2011   TROPONINI <0.30 10/23/2011    Assessment/Plan:  Patient Active Hospital Problem List: Acute on chronic left heart systolic failure  Off dopamine.  Encephalopathy (10/26/2011) Per primary care Acute renal failure (06/25/2011) Stable Diabetes mellitus (06/20/2011) Stable UTI (lower urinary tract infection) (10/22/2011) On antibiotic Hyperkalemia (10/26/2011) Improved     LOS: 2 days    Orpah Cobb  MD  10/28/2011, 8:37 AM

## 2011-10-28 NOTE — Evaluation (Signed)
Clinical/Bedside Swallow Evaluation Patient Details  Name: [name] MRN: [id number] Date of Birth: [birth year]  Today's Date: 10/28/2011 Time: 1191-4782 SLP Time Calculation (min): 45 min  Past Medical History:  Past Medical History  Diagnosis Date  . Diabetes mellitus   . Hypertension   . Vascular disease   . Shortness of breath   . Bladder infection   . COPD (chronic obstructive pulmonary disease)   . Myocardial infarction   . Stroke   . GERD (gastroesophageal reflux disease)   . Diverticulitis   . CHF (congestive heart failure)   . A-fib   . DVT (deep venous thrombosis)    Past Surgical History:  Past Surgical History  Procedure Date  . Bladder tack   . Femoral artery stent   . Abdominal hysterectomy   . Esophagogastroduodenoscopy 07/12/2011    Procedure: ESOPHAGOGASTRODUODENOSCOPY (EGD);  Surgeon: Theda Belfast, MD;  Location: [facility];  Service: Endoscopy;  Laterality: N/A;  bedside   . Flexible sigmoidoscopy 07/12/2011    Procedure: FLEXIBLE SIGMOIDOSCOPY;  Surgeon: Theda Belfast, MD;  Location: [facility];  Service: Endoscopy;  Laterality: N/A;  . Laparotomy 07/12/2011    Procedure: EXPLORATORY LAPAROTOMY;  Surgeon: Kandis Cocking, MD;  Location: [facility];  Service: General;  Laterality: N/A;  exploratory laparotomy, left sigmoid colectomy, creation of colostomy  . Abdominal surgery   . Colostomy   . Eye surgery   . Ivc filter   . Cataract extraction, bilateral   . Retina reattachment surgery   . Flexible sigmoidoscopy 10/23/2011    Procedure: FLEXIBLE SIGMOIDOSCOPY;  Surgeon: Theda Belfast, MD;  Location: [facility];  Service: Endoscopy;  Laterality: N/A;   HPI:  78 year-old female who was just recently discharged after being treated for bleeding from the colostomy site and was found to have AVMs, and also patient was having encephalopathy from UTI was brought to the ER as patient's family found her to be increasingly confused lethargic since evening.  In addition they also noticed patient increasing coughing. Patient did not loose consciousness. Did not have any nausea vomiting or diarrhea but did complain of some abdominal discomfort. In the ER patient was found to have hyperkalemia with no EKG changes and worsening renal function with acidosis. Patient has been admitted for further workup. Patient at this time is afebrile and is getting more well oriented and following commands. [name] family feels he is getting better after coming to the ER   Assessment / Plan / Recommendation Clinical Impression  Patient exhibits s/s of oropharyngeal dysphagia with poor oral control with all presented (except applesauce) and a delayed cough after swallowing liquids.  An objective study (MBS) is indicated to r/o aspiration and determine what consistiencies the patient is able to tolerate.    Aspiration Risk  Moderate    Diet Recommendation Dysphagia 1 (Puree);Honey-thick liquid   Liquid Administration via: Spoon Medication Administration: Whole meds with puree Supervision: Staff feed patient;Full supervision/cueing for compensatory strategies Compensations: Slow rate;Small sips/bites;Check for pocketing Postural Changes and/or Swallow Maneuvers: Seated upright 90 degrees    Other  Recommendations Recommended Consults: MBS Oral Care Recommendations: Oral care QID;Staff/trained caregiver to provide oral care Other Recommendations: Order thickener from pharmacy;Prohibited food (jello, ice cream, thin soups);Clarify dietary restrictions   Follow Up Recommendations  24 hour supervision/assistance;Skilled Nursing facility    Frequency and Duration        Pertinent Vitals/Pain n/a    SLP Swallow Goals Patient will consume recommended diet without observed clinical signs  of aspiration with: Maximum assistance;Maximal cueing Patient will utilize recommended strategies during swallow to increase swallowing safety with: Maximal cueing   Swallow  Study Prior Functional Status       General HPI: 78 year-old female who was just recently discharged after being treated for bleeding from the colostomy site and was found to have AVMs, and also patient was having encephalopathy from UTI was brought to the ER as patient's family found her to be increasingly confused lethargic since evening. In addition they also noticed patient increasing coughing. Patient did not loose consciousness. Did not have any nausea vomiting or diarrhea but did complain of some abdominal discomfort. In the ER patient was found to have hyperkalemia with no EKG changes and worsening renal function with acidosis. Patient has been admitted for further workup. Patient at this time is afebrile and is getting more well oriented and following commands. [name] family feels he is getting better after coming to the ER Type of Study: Bedside swallow evaluation Diet Prior to this Study: Dysphagia 3 (soft) (Daughter reports only purees given the past few days PTA) Temperature Spikes Noted: No Respiratory Status: Supplemental O2 delivered via (comment) History of Recent Intubation: No Behavior/Cognition: Confused;Distractible;Requires cueing;Doesn't follow directions;Decreased sustained attention Oral Cavity - Dentition: Missing dentition (Has partials but does not wear them.) Self-Feeding Abilities: Total assist Patient Positioning: Upright in bed Baseline Vocal Quality: Clear;Breathy Volitional Cough: Cognitively unable to elicit Volitional Swallow: Unable to elicit    Oral/Motor/Sensory Function Overall Oral Motor/Sensory Function: Impaired Labial ROM: Reduced right;Reduced left Labial Symmetry: Within Functional Limits Labial Strength: Reduced Labial Sensation:  (Unable to assess due to MS) Lingual ROM: Reduced right;Reduced left Facial ROM: Within Functional Limits Facial Symmetry: Within Functional Limits   Ice Chips Ice chips: Impaired Presentation: Spoon Oral Phase  Impairments: Reduced labial seal;Reduced lingual movement/coordination;Poor awareness of bolus Oral Phase Functional Implications: Prolonged oral transit;Oral holding   Thin Liquid Thin Liquid: Impaired Presentation: Spoon;Cup (Unable to form seal around cup or straw.) Oral Phase Impairments: Reduced labial seal;Poor awareness of bolus Oral Phase Functional Implications: Right anterior spillage;Left anterior spillage Pharyngeal  Phase Impairments: Cough - Delayed    Nectar Thick Nectar Thick Liquid: Not tested   Honey Thick Honey Thick Liquid: Not tested   Puree Puree: Within functional limits Presentation: Spoon   Solid Solid: Impaired Presentation: Spoon Oral Phase Impairments: Reduced labial seal;Reduced lingual movement/coordination;Impaired anterior to posterior transit;Poor awareness of bolus Oral Phase Functional Implications: Oral residue;Oral holding    Maryjo Rochester T 10/28/2011,12:05 PM

## 2011-10-28 NOTE — Progress Notes (Addendum)
[facility] [place] TEAM 1 - Stepdown/ICU TEAM  PCP:  Laurena Slimmer, MD  Subjective: 78 y/o female s/p colectomy and colostomy for a diverticular bleed in February. She was admitted on 6/5 for weakness and decreased PO intake and found to be dehydrated - Cr 1.7 (baseline 1.4) (on Lasix). She had a few episodes of bleeding per rectum after the colectomy. After being given a Fleets enema on 6/7, she had bloody stool. Sigmoidoscopy done on 6/7 by Dr Elnoria Howard revealed AVMs and a rectal erosion. He preformed an APC. She was also treated for a UTI and discharged back home with Lasix. She returned on 10/26/2011 with lethargy and was found to have ARF, a UTI, lactic acidosis, hyperkalemia, and acute severe thrombocytopenia. CT abd/pelvis revealed diffuse anasarca. Exam revealed anasarca as well.   At the time of my visit today the pt is lethargic, but able to be awakened.  She will answer short direct questions.  She denies cp, sob, or n/v, and c/o hunger.  She asks to be allowed to get up into a chair.    Objective:  Intake/Output Summary (Last 24 hours) at 10/28/11 1305 Last data filed at 10/28/11 0801  Gross per 24 hour  Intake  54.37 ml  Output   1150 ml  Net -1095.63 ml   Blood pressure 106/55, pulse 94, temperature 97.7 F (36.5 C), temperature source Oral, resp. rate 11, height 5\' 3"  (1.6 m), weight 75.2 kg (165 lb 12.6 oz), SpO2 96.00%.  CBG (last 3)   Basename 10/28/11 0800 10/27/11 2137 10/27/11 1821  GLUCAP 99 121* 114*   Physical Exam: General: No acute respiratory distress Lungs: mild bibasilar crackles - no wheeze Cardiovascular: Regular rate and rhythm without murmur gallop or rub normal S1 and S2 Abdomen: Nontender, nondistended, soft, bowel sounds positive, no rebound, no ascites, no appreciable mass Extremities: diffuse 2+ edema of upper and lower extremeties B - no erythema or cyanosis Neuro:  Lethargic but arousable - no focal neuro deficit appreciable   Lab  Results:  Basename 10/28/11 0420 10/27/11 1010 10/27/11 0300  NA 138 134* 131*  K 4.6 5.4* 5.4*  CL 105 100 98  CO2 21 18* 18*  GLUCOSE 112* 133* 173*  BUN 70* 73* 74*  CREATININE 1.72* 2.07* 2.16*  CALCIUM 8.6 8.8 8.7  MG 2.1 -- --  PHOS -- -- --   Basename 10/28/11 0420 10/26/11 1848  AST 58* 52*  ALT 43* 31  ALKPHOS 128* 142*  BILITOT 0.9 0.7  PROT 5.7* 6.6  ALBUMIN 2.4* 2.7*   Basename 10/28/11 0420 10/27/11 1010 10/27/11 0300 10/26/11 1848  WBC 7.1 14.8* 5.7 --  NEUTROABS -- -- 4.6 4.9  HGB 12.4 13.0 13.1 --  HCT 39.8 40.4 40.4 --  MCV 86.1 86.1 85.2 --  PLT 25* 23* 49* --   Micro Results: Recent Results (from the past 240 hour(s))  URINE CULTURE     Status: Normal (Preliminary result)   Collection Time   10/26/11  7:56 PM      Component Value Range Status Comment   Specimen Description URINE, CATHETERIZED   Final    Special Requests ADDED [id number]   Final    Culture  Setup Time [id number]   Final    Colony Count >=100,000 COLONIES/ML   Final    Culture ENTEROCOCCUS SPECIES   Final    Report Status PENDING   Incomplete   CULTURE, BLOOD (ROUTINE X 2)     Status: Normal (Preliminary  result)   Collection Time   10/26/11 10:10 PM      Component Value Range Status Comment   Specimen Description BLOOD RIGHT ARM   Final    Special Requests BOTTLES DRAWN AEROBIC AND ANAEROBIC [facility] EACH   Final    Culture  Setup Time [id number]   Final    Culture     Final    Value:        BLOOD CULTURE RECEIVED NO GROWTH TO DATE CULTURE WILL BE HELD FOR 5 DAYS BEFORE ISSUING A FINAL NEGATIVE REPORT   Report Status PENDING   Incomplete   CULTURE, BLOOD (ROUTINE X 2)     Status: Normal (Preliminary result)   Collection Time   10/26/11 10:20 PM      Component Value Range Status Comment   Specimen Description BLOOD RIGHT ARM   Final    Special Requests BOTTLES DRAWN AEROBIC AND ANAEROBIC 5cc EACH   Final    Culture  Setup Time [id number]   Final    Culture     Final    Value:         BLOOD CULTURE RECEIVED NO GROWTH TO DATE CULTURE WILL BE HELD FOR 5 DAYS BEFORE ISSUING A FINAL NEGATIVE REPORT   Report Status PENDING   Incomplete   MRSA PCR SCREENING     Status: Normal   Collection Time   10/27/11 12:47 AM      Component Value Range Status Comment   MRSA by PCR NEGATIVE  NEGATIVE Final    Studies/Results: All recent x-ray/radiology reports have been reviewed in detail.   Medications: I have reviewed the patient's complete medication list.  Assessment/Plan:  ?Sepsis from UTI vs respiratory infection  On empiric abx - CT abd/pelvis revealed some edema of the colon - nonspecific colitis? Ischemic colitis? - continue to follow - clinically much more stable at present  Toxic Metabolic Encephalopathy  Due to sepsis, acidosis, ARF - CT head reveals old infarcts - mental status is slowly improving - I suspect her encephalopathy is due to uremia as well as her UTI  UTI Urine cx growing >100k Enterococcus species - I suspect this will be a drug resistant strain - will await sensitivities - is on vanc - may need to change to ampicillin vs/ unasyn  Acute renal failure Severe - due to lasix, anasarca from third spacing of fluid, poor cardiac output - much improved at this time - baseline crt reportedly ~1.4 - BUN remains quite elevated - watch w/ careful, slowly titrated diuresis  Metabolic acidosis  Due to ARF + lactic acidosis - resolved - lactate now essentially normal   Volume status Classic picture of intravascular volume depletion despite severe total body overload with signif 3rd space loss in setting of severe LV systolic dysfunction - will attempt to SLOWLY titrate diuretic - nutrition consult to maximize protein intake - watch renal function very closley  Hyperkalemia Resolved - due to acute renal failure  DM CBG reasonably controlled at present  Acute exacerbation of Chronic Systolic CHF EF 30-35% via echo Sep 22, 2011 - no ACE/ARB due to renal failure  - Cards is following   ? Ischemic colitis Doubt - will advance diet and follow   Edematous Colon  As above - possible colitis, but likely simply due to anasarca/low protein state  Protein Calorie Malnutrition Begin diet - advance as able - nutrition consult  Diverticulosis of colon with hemorrhage s/p left colectomy and colostomy 07/11/11  Thrombocytopenia  plt dropped from 120s to 49 and now down to 20s - suspect due to infection / she has a chronic thrombocytopenia as well - holding steady at this time  Recent GI bleeds s/p colectomy for diverticular bleed and then s/p APC of lesion in sigmoid which was thought to be bleeding.  Watch for bleeding with above thrombocytopenia - Hgb appears to be stable at this time  Persistent midline abdom wound Related to GI surgery - continue wound vac as per outpt tx plan  Dispo Stable for tele bed at this time  Lonia Blood, MD [facility] Office  [phone] Pager [phone]  On-Call/Text Page:      [email or web address]      password [facility]

## 2011-10-28 NOTE — Progress Notes (Signed)
Pharmacist Heart Failure Core Measure Documentation  Assessment: [name] has an EF documented as 30-35% on 09/22/2011 by 2D Echo.  Rationale: Heart failure patients with left ventricular systolic dysfunction (LVSD) and an EF < 40% should be prescribed an angiotensin converting enzyme inhibitor (ACEI) or angiotensin receptor blocker (ARB) at discharge unless a contraindication is documented in the medical record.  This patient is not currently on an ACEI or ARB for HF.  This note is being placed in the record in order to provide documentation that a contraindication to the use of these agents is present for this encounter.  ACE Inhibitor or Angiotensin Receptor Blocker is contraindicated (specify all that apply)  []   ACEI allergy AND ARB allergy []   Angioedema []   Moderate or severe aortic stenosis []   Hyperkalemia []   Hypotension []   Renal artery stenosis [x]   Worsening renal function, preexisting renal disease or dysfunction (acute on chronic renal failure)   Bayard Hugger, PharmD, BCPS  Clinical Pharmacist  Pager: [phone]   10/28/2011 1:57 PM

## 2011-10-28 NOTE — Procedures (Signed)
Objective Swallowing Evaluation: Modified Barium Swallowing Study  Patient Details  Name: [name] MRN: [id number] Date of Birth: 1925-09-21  Today's Date: 10/28/2011 Time: 1335-1400 SLP Time Calculation (min): 25 min  Past Medical History:  Past Medical History  Diagnosis Date  . Diabetes mellitus   . Hypertension   . Vascular disease   . Shortness of breath   . Bladder infection   . COPD (chronic obstructive pulmonary disease)   . Myocardial infarction   . Stroke   . GERD (gastroesophageal reflux disease)   . Diverticulitis   . CHF (congestive heart failure)   . A-fib   . DVT (deep venous thrombosis)    Past Surgical History:  Past Surgical History  Procedure Date  . Bladder tack   . Femoral artery stent   . Abdominal hysterectomy   . Esophagogastroduodenoscopy 07/12/2011    Procedure: ESOPHAGOGASTRODUODENOSCOPY (EGD);  Surgeon: Theda Belfast, MD;  Location: [facility];  Service: Endoscopy;  Laterality: N/A;  bedside   . Flexible sigmoidoscopy 07/12/2011    Procedure: FLEXIBLE SIGMOIDOSCOPY;  Surgeon: Theda Belfast, MD;  Location: [facility];  Service: Endoscopy;  Laterality: N/A;  . Laparotomy 07/12/2011    Procedure: EXPLORATORY LAPAROTOMY;  Surgeon: Kandis Cocking, MD;  Location: [facility];  Service: General;  Laterality: N/A;  exploratory laparotomy, left sigmoid colectomy, creation of colostomy  . Abdominal surgery   . Colostomy   . Eye surgery   . Ivc filter   . Cataract extraction, bilateral   . Retina reattachment surgery   . Flexible sigmoidoscopy 10/23/2011    Procedure: FLEXIBLE SIGMOIDOSCOPY;  Surgeon: Theda Belfast, MD;  Location: [facility];  Service: Endoscopy;  Laterality: N/A;   HPI:  78 year-old female who was just recently discharged after being treated for bleeding from the colostomy site and was found to have AVMs, and also patient was having encephalopathy from UTI was brought to the ER as patient's family found her to be increasingly  confused lethargic since evening. In addition they also noticed patient increasing coughing. Patient did not loose consciousness. Did not have any nausea vomiting or diarrhea but did complain of some abdominal discomfort. In the ER patient was found to have hyperkalemia with no EKG changes and worsening renal function with acidosis. Patient has been admitted for further workup. Patient at this time is afebrile and is getting more well oriented and following commands. [name] family feels he is getting better after coming to the ER     Assessment / Plan / Recommendation Clinical Impression  Dysphagia Diagnosis: Moderate oral phase dysphagia;Moderate pharyngeal phase dysphagia;Moderate cervical esophageal phase dysphagia Clinical impression: Patient exhibits a moderate oropharyngeal dysphagia characterized by reduced oral coordination with thin , purees, and solids, delayed swallow initiation, reduced laryngeal elevation, and aspiration of thin liquids during the swallow.  The patient did cough with aspiration, but the aspirated material did not clear.    Treatment Recommendation  F/U MBS in ___ days (Comment)    Diet Recommendation Dysphagia 2 (Fine chop);Nectar-thick liquid   Liquid Administration via: Spoon Medication Administration: Whole meds with puree Supervision: Staff feed patient;Full supervision/cueing for compensatory strategies Compensations: Slow rate;Small sips/bites;Check for pocketing Postural Changes and/or Swallow Maneuvers: Seated upright 90 degrees    Other  Recommendations Recommended Consults: Consider esophageal assessment Oral Care Recommendations: Oral care QID;Staff/trained caregiver to provide oral care Other Recommendations: Order thickener from pharmacy;Prohibited food (jello, ice cream, thin soups);Clarify dietary restrictions   Follow Up Recommendations  Skilled Nursing facility;24  hour supervision/assistance    Frequency and Duration min 2x/week  2 weeks     Pertinent Vitals/Pain n/a    SLP Swallow Goals Patient will consume recommended diet without observed clinical signs of aspiration with: Moderate assistance Patient will utilize recommended strategies during swallow to increase swallowing safety with: Maximal cueing   General HPI: 78 year-old female who was just recently discharged after being treated for bleeding from the colostomy site and was found to have AVMs, and also patient was having encephalopathy from UTI was brought to the ER as patient's family found her to be increasingly confused lethargic since evening. In addition they also noticed patient increasing coughing. Patient did not loose consciousness. Did not have any nausea vomiting or diarrhea but did complain of some abdominal discomfort. In the ER patient was found to have hyperkalemia with no EKG changes and worsening renal function with acidosis. Patient has been admitted for further workup. Patient at this time is afebrile and is getting more well oriented and following commands. [name] family feels he is getting better after coming to the ER Type of Study: Modified Barium Swallowing Study Reason for Referral: Objectively evaluate swallowing function Previous Swallow Assessment: BSE 10/28/11 Diet Prior to this Study: Dysphagia 3 (soft) (Daughter reports only purees given the past few days PTA) Temperature Spikes Noted: No Respiratory Status: Supplemental O2 delivered via (comment) History of Recent Intubation: No Behavior/Cognition: Confused;Distractible;Requires cueing;Doesn't follow directions;Decreased sustained attention Oral Cavity - Dentition: Missing dentition (Has partials but does not wear them.) Oral Motor / Sensory Function: Impaired motor;Impaired sensory Self-Feeding Abilities: Total assist Patient Positioning: Upright in chair Baseline Vocal Quality: Clear;Breathy Volitional Cough: Cognitively unable to elicit Volitional Swallow: Unable to elicit Anatomy:  Within functional limits Pharyngeal Secretions: Not observed secondary MBS    Reason for Referral Objectively evaluate swallowing function   Oral Phase     Pharyngeal Phase Pharyngeal Phase: Impaired   Cervical Esophageal Phase Cervical Esophageal Phase: Impaired    Maryjo Rochester T 10/28/2011, 2:41 PM

## 2011-10-28 NOTE — Progress Notes (Signed)
10/28/11 Nursing 1945 Patient was a transfer from 3300. While doing skin assessment patient has unstageable wound to left foot measuring 1cmx1cm.  [name] sacrum is WNL.  Previous charting from 3300, states that patient has stage 2 on sacrum, while doing skin assessment by 2 RNs no stage 2 was found on sacrum.  Patient has wound vac to abdomen.  [name] daughter currently at bedside. Will continue to monitor patient.  Nelda Marseille, RN

## 2011-10-28 NOTE — Progress Notes (Signed)
Pt had 6 beat run of Vtach.  MD notified.  New orders given for Magnesium level to be drawn.  Will continue to monitor.  Urine output for 4 hours.  MD notified. No new orders given.  Maximino Greenland RN

## 2011-10-29 DIAGNOSIS — R197 Diarrhea, unspecified: Secondary | ICD-10-CM

## 2011-10-29 DIAGNOSIS — E782 Mixed hyperlipidemia: Secondary | ICD-10-CM

## 2011-10-29 DIAGNOSIS — N179 Acute kidney failure, unspecified: Secondary | ICD-10-CM

## 2011-10-29 DIAGNOSIS — I509 Heart failure, unspecified: Secondary | ICD-10-CM

## 2011-10-29 LAB — CBC
HCT: 40.7 % (ref 36.0–46.0)
Hemoglobin: 13 g/dL (ref 12.0–15.0)
MCH: 27.5 pg (ref 26.0–34.0)
MCHC: 31.9 g/dL (ref 30.0–36.0)
RBC: 4.72 MIL/uL (ref 3.87–5.11)

## 2011-10-29 LAB — URINE CULTURE

## 2011-10-29 LAB — COMPREHENSIVE METABOLIC PANEL
ALT: 46 U/L — ABNORMAL HIGH (ref 0–35)
Alkaline Phosphatase: 144 U/L — ABNORMAL HIGH (ref 39–117)
BUN: 70 mg/dL — ABNORMAL HIGH (ref 6–23)
CO2: 19 mEq/L (ref 19–32)
GFR calc Af Amer: 30 mL/min — ABNORMAL LOW (ref >90)
GFR calc non Af Amer: 26 mL/min — ABNORMAL LOW (ref >90)
Glucose, Bld: 214 mg/dL — ABNORMAL HIGH (ref 70–99)
Potassium: 5.2 mEq/L — ABNORMAL HIGH (ref 3.5–5.1)
Total Protein: 5.9 g/dL — ABNORMAL LOW (ref 6.0–8.3)

## 2011-10-29 LAB — GLUCOSE, CAPILLARY
Glucose-Capillary: 173 mg/dL — ABNORMAL HIGH (ref 70–99)
Glucose-Capillary: 232 mg/dL — ABNORMAL HIGH (ref 70–99)

## 2011-10-29 LAB — FIBRINOGEN: Fibrinogen: 166 mg/dL — ABNORMAL LOW (ref 204–475)

## 2011-10-29 MED ORDER — FUROSEMIDE 10 MG/ML IJ SOLN
40.0000 mg | Freq: Once | INTRAMUSCULAR | Status: AC
  Administered 2011-10-29: 40 mg via INTRAVENOUS
  Filled 2011-10-29 (×2): qty 4

## 2011-10-29 MED ORDER — SODIUM POLYSTYRENE SULFONATE 15 GM/60ML PO SUSP
15.0000 g | Freq: Once | ORAL | Status: AC
  Administered 2011-10-29: 15 g via ORAL
  Filled 2011-10-29: qty 60

## 2011-10-29 MED ORDER — LEVOFLOXACIN IN D5W 250 MG/50ML IV SOLN
250.0000 mg | INTRAVENOUS | Status: DC
  Filled 2011-10-29: qty 50

## 2011-10-29 MED ORDER — ENSURE PUDDING PO PUDG
1.0000 | Freq: Three times a day (TID) | ORAL | Status: DC
  Administered 2011-10-29 – 2011-11-01 (×4): 1 via ORAL

## 2011-10-29 NOTE — Progress Notes (Signed)
[name] daughter at bedside. [name] daughter does not want bedalarm turned on because she repositions her mother and provides care for her mother.  [name] daughter states that she is going to watch her. Will continue to monitor patient. Nelda Marseille, RN

## 2011-10-29 NOTE — Progress Notes (Addendum)
-   I have review the data and plan below, I have agree. - Continue to monitor Plt's. - Patient has remained afebrile, mentation improved. - Will need palliative care discussion.  - Hyperkalemia, give one dose of kayexalate. - Basic metabolic panel.

## 2011-10-29 NOTE — Plan of Care (Signed)
Problem: Phase II Progression Outcomes Goal: ADLs completed with minimal assistance Outcome: Not Progressing Pt. Unable to do ADL's-daughter care for pt. At home.

## 2011-10-29 NOTE — Progress Notes (Signed)
[facility] PROGRESS NOTE  [name] UXL:[id number] DOB: Sep 25, 1925 DOA: 10/26/2011 PCP: Laurena Slimmer, MD  Brief narrative:      78 y/o female s/p colectomy and colostomy for a diverticular bleed in February. She was admitted on 6/5 for weakness and decreased PO intake and found to be dehydrated - Cr 1.7 (baseline 1.4) (on Lasix). She had a few episodes of bleeding per rectum after the colectomy. After being given a Fleets enema on 6/7, she had bloody stool. Sigmoidoscopy done on 6/7 by Dr Elnoria Howard revealed AVMs and a rectal erosion. He preformed an APC. She was also treated for a UTI and discharged back home with Lasix. She returned on 10/26/2011 with lethargy and was found to have ARF, a UTI, lactic acidosis, hyperkalemia, and acute severe thrombocytopenia. CT abd/pelvis revealed diffuse anasarca. Exam revealed anasarca as well.    Consultants:  Cardiology, Dr. Algie Coffer  Procedures:  none  Antibiotics:  Cefepime 6/10>>6/13  Fluconazole 6/10>>6/13  Vancomycin 6/10>>  HPI/Subjective: Daughter at bedside states pt is at or near baseline. She was up all night, but not agitated. Pt requires 24 hour care at home with full assistance provided by daughters.   Objective: Filed Vitals:   10/28/11 1548 10/28/11 1900 10/28/11 2033 10/29/11 0505  BP: 116/58 140/84 106/65 114/80  Pulse:  89 89 84  Temp: 97.9 F (36.6 C) 95 F (35 C) 97.7 F (36.5 C) 98 F (36.7 C)  TempSrc: Oral Axillary Axillary Axillary  Resp:  24 22 20   Height:      Weight:    72.8 kg (160 lb 7.9 oz)  SpO2:  99% 96% 92%    Intake/Output Summary (Last 24 hours) at 10/29/11 1054 Last data filed at 10/29/11 0932  Gross per 24 hour  Intake    427 ml  Output    675 ml  Net   -248 ml    Exam:   General:  Elderly female sitting upright in bed, awake and alert in NAD, mostly nonverbal  Cardiovascular: S1S2 RRR with SEM, 1-2+ pedal edema bilateral lower extremities, 1+ bilateral edema upper  extremities  Respiratory: decreased inspiratory effort, few bibasilar crackles, no wheeze, no increased wob  Abdomen: soft, NT, ND, BS+  Neuro: unable to cooperate with exam. She appears to be more alert than previous exams. No focal deficits  Skin: Petichial rash on sacral area and upper back  Psych: mostly nonverbal  Data Reviewed: Basic Metabolic Panel:  Lab 10/29/11 5366 10/28/11 0420 10/27/11 1010 10/27/11 0300 10/26/11 2213 10/23/11 0345  NA 139 138 134* 131* 130* --  K 5.2* 4.6 5.4* 5.4* 5.6* --  CL 105 105 100 98 95* --  CO2 19 21 18* 18* 17* --  GLUCOSE 214* 112* 133* 173* 258* --  BUN 70* 70* 73* 74* 73* --  CREATININE 1.73* 1.72* 2.07* 2.16* 2.12* --  CALCIUM 8.7 8.6 8.8 8.7 8.6 --  MG -- 2.1 -- -- -- 2.1  PHOS -- -- -- -- -- 4.6   Liver Function Tests:  Lab 10/29/11 0500 10/28/11 0420 10/26/11 1848 10/22/11 1615  AST 59* 58* 52* 28  ALT 46* 43* 31 22  ALKPHOS 144* 128* 142* 142*  BILITOT 1.2 0.9 0.7 0.6  PROT 5.9* 5.7* 6.6 6.1  ALBUMIN 2.4* 2.4* 2.7* 2.4*   No results found for this basename: LIPASE:5,AMYLASE:5 in the last 168 hours  Lab 10/26/11 2212  AMMONIA 26   CBC:  Lab 10/29/11 0500 10/28/11 0420 10/27/11 1010 10/27/11 0300  10/26/11 1848  WBC 8.5 7.1 14.8* 5.7 6.5  NEUTROABS -- -- -- 4.6 4.9  HGB 13.0 12.4 13.0 13.1 13.1  HCT 40.7 39.8 40.4 40.4 41.5  MCV 86.2 86.1 86.1 85.2 86.1  PLT 20* 25* 23* 49* 89*   Cardiac Enzymes:  Lab 10/23/11 0345 10/22/11 2211 10/22/11 1615  CKTOTAL 52 56 53  CKMB 4.0 4.4* 4.2*  CKMBINDEX -- -- --  TROPONINI <0.30 <0.30 <0.30   BNP: No components found with this basename: POCBNP:5 CBG:  Lab 10/29/11 0801 10/28/11 2135 10/28/11 0800 10/27/11 2137 10/27/11 1821  GLUCAP 222* 153* 99 121* 114*    Recent Results (from the past 240 hour(s))  URINE CULTURE     Status: Normal   Collection Time   10/26/11  7:56 PM      Component Value Range Status Comment   Specimen Description URINE, CATHETERIZED   Final     Special Requests ADDED [id number]   Final    Culture  Setup Time [id number]   Final    Colony Count >=100,000 COLONIES/ML   Final    Culture ENTEROCOCCUS SPECIES   Final    Report Status 10/29/2011 FINAL   Final    Organism ID, Bacteria ENTEROCOCCUS SPECIES   Final   CULTURE, BLOOD (ROUTINE X 2)     Status: Normal (Preliminary result)   Collection Time   10/26/11 10:10 PM      Component Value Range Status Comment   Specimen Description BLOOD RIGHT ARM   Final    Special Requests BOTTLES DRAWN AEROBIC AND ANAEROBIC 5CC EACH   Final    Culture  Setup Time [id number]   Final    Culture     Final    Value:        BLOOD CULTURE RECEIVED NO GROWTH TO DATE CULTURE WILL BE HELD FOR 5 DAYS BEFORE ISSUING A FINAL NEGATIVE REPORT   Report Status PENDING   Incomplete   CULTURE, BLOOD (ROUTINE X 2)     Status: Normal (Preliminary result)   Collection Time   10/26/11 10:20 PM      Component Value Range Status Comment   Specimen Description BLOOD RIGHT ARM   Final    Special Requests BOTTLES DRAWN AEROBIC AND ANAEROBIC 5cc EACH   Final    Culture  Setup Time [id number]   Final    Culture     Final    Value:        BLOOD CULTURE RECEIVED NO GROWTH TO DATE CULTURE WILL BE HELD FOR 5 DAYS BEFORE ISSUING A FINAL NEGATIVE REPORT   Report Status PENDING   Incomplete   MRSA PCR SCREENING     Status: Normal   Collection Time   10/27/11 12:47 AM      Component Value Range Status Comment   MRSA by PCR NEGATIVE  NEGATIVE Final      Studies: Dg Swallowing Func-no Report  10/28/2011  CLINICAL DATA: Dysphagia   FLUOROSCOPY FOR SWALLOWING FUNCTION STUDY:  Fluoroscopy was provided for swallowing function study, which was  administered by a speech pathologist.  Final results and recommendations  from this study are contained within the speech pathology report.      Scheduled Meds:   . atorvastatin  20 mg Oral QHS  . famotidine  20 mg Oral QHS  . fluticasone  1 spray Each Nare Daily  . furosemide   40 mg Intravenous Once  . insulin aspart  0-9 Units Subcutaneous TID WC  .  insulin detemir  5 Units Subcutaneous QHS  . levofloxacin (LEVAQUIN) IV  250 mg Intravenous Q48H  . metoprolol tartrate  12.5 mg Oral BID  . sodium chloride  3 mL Intravenous Q12H  . vancomycin  1,000 mg Intravenous Q48H  . DISCONTD: albuterol  2.5 mg Nebulization Q6H  . DISCONTD: ceFEPime (MAXIPIME) IV  1 g Intravenous Q24H  . DISCONTD: fluconazole (DIFLUCAN) IV  100 mg Intravenous Q24H  . DISCONTD: furosemide  20 mg Intravenous Daily  . DISCONTD: ipratropium  0.5 mg Nebulization Q6H  . DISCONTD: levofloxacin (LEVAQUIN) IV  250 mg Intravenous Q48H   Continuous Infusions:    Assessment/Plan:  1. ?Sepsis from UTI vs respiratory infection  Now HD stable. She did require pressors in the unit that have since been discontinued. Antibiotics can be narrowed given urine culture results. CT abd/pelvis revealed some edema of the colon - nonspecific colitis? Ischemic colitis? - continue to follow - clinically much more stable at present  2. Toxic Metabolic Encephalopathy  Due to sepsis, acidosis, ARF - CT head reveals old infarcts - mental status is slowly improving - I suspect her encephalopathy is due to uremia as well as her UTI. She is currently at or near baseline per daughter at bedside on exam.  3. Enterococcus UTI: Sensitive to Vancomycin D3. Will continue through Sunday  4. Acute renal failure  Admission Cr 2,16 now down to 1.73 - due to lasix, anasarca from third spacing of fluid, poor cardiac output - much improved at this time - baseline crt reportedly ~1.4 - BUN remains quite elevated - watch w/ careful, slowly titrated diuresis.  5. Metabolic acidosis  Due to ARF + lactic acidosis - resolved - lactate now essentially normal   6. Acute on chronic systolic HF: EF 30-35% via echo Sep 22, 2011 - no ACE/ARB due to renal failure. Cardiology following and ordered 1x Lasix dose today. 1.7L output yesterday. Weight  72.8kg<--75.1kg on 6/11.  7. ? Ischemic colitis  Doubt - tolerating diet  8. Edematous Colon  As above - possible colitis, but likely simply due to anasarca/low protein state   9. Protein Calorie Malnutrition  Begin diet - advance as able - nutrition consult   10. Diverticulosis of colon with hemorrhage s/p left colectomy and colostomy 07/11/11   11. Thrombocytopenia/Petichiae  plt dropped from 120s to 49 and now down to 20s - suspect due to infection / she has a chronic thrombocytopenia as well. Continue to monitor closely. Upon chart review, pt did not receive any heparin during recent hospitalization therefore no concern for HIT.  12. Recent GI bleeds s/p colectomy for diverticular bleed and then s/p APC of lesion in sigmoid which was thought to be bleeding.  Watch for bleeding with above thrombocytopenia - Hgb appears to be stable at this time   13. Persistent midline abdom wound  Related to GI surgery - continue wound vac as per outpt tx plan   14. Dispo  Can likely d/c tele in am. Home with daughter when medically stable/antibiotics complete.     Code Status: DNR Family Communication: plan of care discussed with daughter at bedside Disposition Plan: home with daughter when medically stable.   Cordelia Pen, NP-C [facility] Service [facility]  pgr [phone]   If 7PM-7AM, please contact night-coverage [email or web address] Password [email or web address] 10/29/2011, 10:54 AM   LOS: 3 days

## 2011-10-29 NOTE — Progress Notes (Signed)
Subjective:  No chest pain. Bilateral leg and arm swelling continues.  Objective:  Vital Signs in the last 24 hours: Temp:  [95 F (35 C)-98 F (36.7 C)] 98 F (36.7 C) (06/13 0505) Pulse Rate:  [84-89] 84  (06/13 0505) Cardiac Rhythm:  [-] Normal sinus rhythm (06/12 1945) Resp:  [20-24] 20  (06/13 0505) BP: (106-140)/(55-84) 114/80 mmHg (06/13 0505) SpO2:  [92 %-99 %] 92 % (06/13 0505) Weight:  [72.8 kg (160 lb 7.9 oz)] 72.8 kg (160 lb 7.9 oz) (06/13 0505)  Physical Exam: BP Readings from Last 1 Encounters:  10/29/11 114/80    Wt Readings from Last 1 Encounters:  10/29/11 72.8 kg (160 lb 7.9 oz)    Weight change: -2.4 kg (-5 lb 4.7 oz)  HEENT: [place]/AT, Eyes-Brown, PERL, EOMI, Conjunctiva-Pink, Sclera-Non-icteric Neck: No JVD, No bruit, Trachea midline. Lungs:  Clear, Bilateral. Cardiac:  Regular rhythm, normal S1 and S2, no S3.  Abdomen:  Soft, non-tender. Extremities:  1+ edema present. No cyanosis. No clubbing. CNS: AxOx3, Cranial nerves grossly intact, moves all 4 extremities. Right handed. Skin: Warm and dry.   Intake/Output from previous day: 06/12 0701 - 06/13 0700 In: 420 [P.O.:120; IV Piggyback:300] Out: 825 [Urine:825]    Lab Results: BMET    Component Value Date/Time   NA 139 10/29/2011 0500   K 5.2* 10/29/2011 0500   CL 105 10/29/2011 0500   CO2 19 10/29/2011 0500   GLUCOSE 214* 10/29/2011 0500   BUN 70* 10/29/2011 0500   CREATININE 1.73* 10/29/2011 0500   CALCIUM 8.7 10/29/2011 0500   GFRNONAA 26* 10/29/2011 0500   GFRAA 30* 10/29/2011 0500   CBC    Component Value Date/Time   WBC 7.1 10/28/2011 0420   WBC 9.1 08/23/2009 0819   RBC 4.62 10/28/2011 0420   RBC 4.42 08/23/2009 0819   HGB 12.4 10/28/2011 0420   HGB 12.6 08/23/2009 0819   HCT 39.8 10/28/2011 0420   HCT 38.4 08/23/2009 0819   PLT 25* 10/28/2011 0420   PLT 243 08/23/2009 0819   MCV 86.1 10/28/2011 0420   MCV 86.8 08/23/2009 0819   MCH 26.8 10/28/2011 0420   MCH 28.4 08/23/2009 0819   MCHC 31.2 10/28/2011  0420   MCHC 32.7 08/23/2009 0819   RDW 21.5* 10/28/2011 0420   RDW 16.1* 08/23/2009 0819   LYMPHSABS 0.6* 10/27/2011 0300   LYMPHSABS 1.4 08/23/2009 0819   MONOABS 0.5 10/27/2011 0300   MONOABS 0.7 08/23/2009 0819   EOSABS 0.0 10/27/2011 0300   EOSABS 0.3 08/23/2009 0819   BASOSABS 0.0 10/27/2011 0300   BASOSABS 0.0 08/23/2009 0819   CARDIAC ENZYMES Lab Results  Component Value Date   CKTOTAL 52 10/23/2011   CKMB 4.0 10/23/2011   TROPONINI <0.30 10/23/2011    Assessment/Plan:  Patient Active Hospital Problem List: Acute on chronic left heart systolic failure  On lasix Encephalopathy (10/26/2011) Per primary care Acute renal failure (06/25/2011) Stable Diabetes mellitus (06/20/2011) Stable UTI (lower urinary tract infection) (10/22/2011) On antibiotic Hyperkalemia (10/26/2011) Stable    LOS: 3 days    Orpah Cobb  MD  10/29/2011, 7:47 AM

## 2011-10-29 NOTE — Progress Notes (Signed)
Nutrition Follow-up/consult appears severely malnourished   RD spoke with pt's daughters in room, pt is tolerating thickened liquids well at this time, but not the D2 diet. RD spoke with SLP who agrees Dysphagia 1 diet would be appropriate, RD will change.  Per daughters pt drinks a Glucerna shake most days as her lunch meal, and other meals are a puree. RD encouraged adequate protein intake, through meals or increasing supplements. Pt with increased needs d/t wound healing.  Pt continues to have edema. daughters agreeable to Ensure Pudding TID for increased kcal and protein intake to assist with healing.   Diet Order:  Dysphagia 2 with nectar thick liquids.  RD reminded family to thicken all liquids including home Glucerna  Meds: Scheduled Meds:   . atorvastatin  20 mg Oral QHS  . famotidine  20 mg Oral QHS  . fluticasone  1 spray Each Nare Daily  . furosemide  40 mg Intravenous Once  . insulin aspart  0-9 Units Subcutaneous TID WC  . insulin detemir  5 Units Subcutaneous QHS  . metoprolol tartrate  12.5 mg Oral BID  . sodium chloride  3 mL Intravenous Q12H  . vancomycin  1,000 mg Intravenous Q48H  . DISCONTD: albuterol  2.5 mg Nebulization Q6H  . DISCONTD: ceFEPime (MAXIPIME) IV  1 g Intravenous Q24H  . DISCONTD: fluconazole (DIFLUCAN) IV  100 mg Intravenous Q24H  . DISCONTD: furosemide  20 mg Intravenous Daily  . DISCONTD: ipratropium  0.5 mg Nebulization Q6H  . DISCONTD: levofloxacin (LEVAQUIN) IV  250 mg Intravenous Q48H  . DISCONTD: levofloxacin (LEVAQUIN) IV  250 mg Intravenous Q48H   Continuous Infusions:  PRN Meds:.acetaminophen, acetaminophen, albuterol, feeding supplement, food thickener, morphine injection, ondansetron (ZOFRAN) IV, ondansetron  Labs:  CMP     Component Value Date/Time   NA 139 10/29/2011 0500   K 5.2* 10/29/2011 0500   CL 105 10/29/2011 0500   CO2 19 10/29/2011 0500   GLUCOSE 214* 10/29/2011 0500   BUN 70* 10/29/2011 0500   CREATININE 1.73* 10/29/2011  0500   CALCIUM 8.7 10/29/2011 0500   PROT 5.9* 10/29/2011 0500   ALBUMIN 2.4* 10/29/2011 0500   AST 59* 10/29/2011 0500   ALT 46* 10/29/2011 0500   ALKPHOS 144* 10/29/2011 0500   BILITOT 1.2 10/29/2011 0500   GFRNONAA 26* 10/29/2011 0500   GFRAA 30* 10/29/2011 0500     Intake/Output Summary (Last 24 hours) at 10/29/11 1217 Last data filed at 10/29/11 0932  Gross per 24 hour  Intake    427 ml  Output    675 ml  Net   -248 ml    Weight Status:  160 lbs, down 5 lbs since admission r/t fluids  Re-estimated needs:  1600-1800 kcal, 80-90 gm protein, 1.6-1.8 L fluids  Nutrition Dx:  Increased nutrient needs r/t wound healing AEB estimated nutrition needs, ongoing   Goal:  Oral intake to meet >90% of estimated nutrition needs, unmet  Intervention:   1. Add Ensure Pudding TID (Vanilla only) 2. Change diet to Dysphagia 1, continue nectar thick liquids 3. RD will continue to follow  Monitor:  PO intake, diet tolerance, weight, labs, I/O's   Rudean Haskell Pager #:  [phone]

## 2011-10-29 NOTE — Evaluation (Signed)
Speech Language Pathology Evaluation Patient Details Name: [name] MRN: [id number] DOB: May 18, 1926 Today's Date: 10/29/2011 Time: 6295-2841 SLP Time Calculation (min): 8 min  Problem List:  Patient Active Problem List  Diagnosis  . Candiduria  . Diabetes mellitus  . Hypertension  . Acute renal failure  . CKD (chronic kidney disease), stage III  . Thrombocytopenia  . Pulmonary edema  . Acute combined systolic and diastolic congestive heart failure  . Hyponatremia  . GI bleed  . LBBB (left bundle branch block)  . Chronic systolic heart failure  . Diverticulosis of colon with hemorrhage s/p left colectomy and colostomy 07/11/11  . Dyspnea  . Back pain  . UTI (lower urinary tract infection)  . Encephalopathy  . Hyperkalemia   Past Medical History:  Past Medical History  Diagnosis Date  . Diabetes mellitus   . Hypertension   . Vascular disease   . Shortness of breath   . Bladder infection   . COPD (chronic obstructive pulmonary disease)   . Myocardial infarction   . Stroke   . GERD (gastroesophageal reflux disease)   . Diverticulitis   . CHF (congestive heart failure)   . A-fib   . DVT (deep venous thrombosis)    Past Surgical History:  Past Surgical History  Procedure Date  . Bladder tack   . Femoral artery stent   . Abdominal hysterectomy   . Esophagogastroduodenoscopy 07/12/2011    Procedure: ESOPHAGOGASTRODUODENOSCOPY (EGD);  Surgeon: Theda Belfast, MD;  Location: [facility];  Service: Endoscopy;  Laterality: N/A;  bedside   . Flexible sigmoidoscopy 07/12/2011    Procedure: FLEXIBLE SIGMOIDOSCOPY;  Surgeon: Theda Belfast, MD;  Location: [facility];  Service: Endoscopy;  Laterality: N/A;  . Laparotomy 07/12/2011    Procedure: EXPLORATORY LAPAROTOMY;  Surgeon: Kandis Cocking, MD;  Location: [facility];  Service: General;  Laterality: N/A;  exploratory laparotomy, left sigmoid colectomy, creation of colostomy  . Abdominal surgery   . Colostomy   . Eye  surgery   . Ivc filter   . Cataract extraction, bilateral   . Retina reattachment surgery   . Flexible sigmoidoscopy 10/23/2011    Procedure: FLEXIBLE SIGMOIDOSCOPY;  Surgeon: Theda Belfast, MD;  Location: [facility];  Service: Endoscopy;  Laterality: N/A;   HPI:  78 year-old female who was just recently discharged after being treated for bleeding from the colostomy site and was found to have AVMs, and also patient was having encephalopathy from UTI was brought to the ER as patient's family found her to be increasingly confused lethargic since evening. In addition they also noticed patient increasing coughing. Patient did not loose consciousness. Did not have any nausea vomiting or diarrhea but did complain of some abdominal discomfort. In the ER patient was found to have hyperkalemia with no EKG changes and worsening renal function with acidosis. Patient has been admitted for further workup. Patient at this time is afebrile and is getting more well oriented and following commands. [name] family feels he is getting better after coming to the ER    Assessment / Plan / Recommendation Clinical Impression  Pt.'s daughter present for speech-cognitvie assessment who reports a steady decline in fucntion since surgery 2/13.  She no longer walks, assist with dressing or meal prep and significantly decreased spontaneous utterances.  She lives with daughter and prior to admission pt. was not oriented to time and spent most of her time listening to tv and radio (visual impairment).  Daughter feels her cognition is "about  the same".  Pt. lethargic for assessment due to awake agitated most of last night,  Pt. exhibits significant cognitive deficits at this time that appear to be similar to baseline.  SLP reviewed strategies to help maintain pt.'s cogntion abilities and slow down progression.  No intervenment is necessary for speech-cognition.  Will continue to follow for dyshagia treatment.      SLP Assessment   Patient does not need any further Speech Lanaguage Pathology Services    Follow Up Recommendations  None    Frequency and Duration           SLP Goals     SLP Evaluation Prior Functioning  Cognitive/Linguistic Baseline: Baseline deficits Baseline deficit details: Per daughters descriptions, it appears that pt. likely experienced underying dementia that worsened after surgey in 2/13 where she no longer helped to dress herself, help with her meals, decreased talking, not walking. Lives With: Daughter Steward Drone, Myrene Buddy assists) Available Help at Discharge: Family Vocation: Retired   IT consultant  Overall Cognitive Status: Impaired Arousal/Alertness: Lethargic Orientation Level: Oriented to person;Disoriented to place;Disoriented to time;Disoriented to situation (DAUGHTER REPORTS ONLY ORIENTED TO SELF PRIOR) Attention: Sustained Sustained Attention: Impaired Sustained Attention Impairment: Verbal basic;Functional basic Memory: Impaired Memory Impairment: Storage deficit;Retrieval deficit;Decreased recall of new information;Decreased short term memory;Prospective memory Awareness: Impaired Awareness Impairment: Intellectual impairment;Emergent impairment;Anticipatory impairment Problem Solving: Impaired Problem Solving Impairment: Verbal basic;Functional basic Safety/Judgment: Impaired    Comprehension  Auditory Comprehension Overall Auditory Comprehension: Impaired Yes/No Questions:  (NO RESPONSE) Commands: Impaired One Step Basic Commands: 0-24% accurate Interfering Components: Attention;Visual impairments;Processing speed EffectiveTechniques: Extra processing time;Repetition;Slowed speech Visual Recognition/Discrimination Discrimination: Not tested Reading Comprehension Reading Status: Not tested    Expression Expression Primary Mode of Expression: Verbal Verbal Expression Overall Verbal Expression: Impaired Initiation: Impaired Level of Generative/Spontaneous  Verbalization:  (NO SPONTANEOUS UTTERANCES OBSERVED) Repetition:  (NT) Naming: Not tested Pragmatics: Impairment Impairments: Abnormal affect;Monotone;Dysprosody;Interpretation of nonverbal communication Interfering Components: Attention;Premorbid deficit Written Expression Dominant Hand: Left Written Expression: Not tested   Oral / Motor Oral Motor/Sensory Function Overall Oral Motor/Sensory Function: Impaired Motor Speech Overall Motor Speech: Impaired Respiration: Impaired Level of Impairment: Word Phonation: Low vocal intensity Resonance: Within functional limits Articulation: Within functional limitis Intelligibility: Intelligibility reduced Word: 50-74% accurate Motor Planning: Witnin functional limits     Breck Coons [employer] [phone]  10/29/2011

## 2011-10-29 NOTE — Progress Notes (Signed)
[name] daughter doesn't want bedalarm on, she states that she will be at bedside watching her mother. [name] daughter states that she turns her mother and will call if she needs help. Will continue to monitor patient. Nelda Marseille, RN

## 2011-10-29 NOTE — Progress Notes (Signed)
Speech Language Pathology Dysphagia Treatment Patient Details Name: [name] MRN: [id number] DOB: Sep 23, 1925 Today's Date: 10/29/2011 Time: 1002-1010 SLP Time Calculation (min): 8 min  Assessment / Plan / Recommendation Clinical Impression  Treatment focused on dysphagia and diet appropriateness with daughter present.  Pt. with audible secretions that appear to be in upper pharynx with spontneous cough but ineffective cough in moving secretions to oral cavity despite repositioning by SLP.  She consumed sip nectar thik milk and several sips necta thick OJ with delayed swallow initiation of approximately 12-30 seconds.  No overt s/s aspiration observed.  Daughter reports she holds and pockets food sometimes at home.  SLP provided education regarding potential worsening of oropharyngeal dysphagia as cognition declines and strategies to facilitate oral and pharyngeal transit.  Recommend she continue Dys 2 diet and nectar thick liquids.  Potential for improvement is questionable given the overall medical decline that is described by daughter.    Diet Recommendation  Continue with Current Diet: Dysphagia 2 (fine chop);Nectar-thick liquid    SLP Plan Continue with current plan of care      Swallowing Goals  SLP Swallowing Goals Patient will consume recommended diet without observed clinical signs of aspiration with: Moderate assistance Swallow Study Goal #1 - Progress: Progressing toward goal Patient will utilize recommended strategies during swallow to increase swallowing safety with: Maximum assistance Swallow Study Goal #2 - Progress: Progressing toward goal  General Behavior/Cognition: Lethargic;Requires cueing;Confused;Cooperative Oral Cavity - Dentition: Missing dentition Patient Positioning: Upright in chair  Oral Cavity - Oral Hygiene Does patient have any of the following "at risk" factors?: Diet - patient on thickened liquids Brush patient's teeth BID with toothbrush (using  toothpaste with fluoride): Yes Patient is HIGH RISK - Oral Care Protocol followed (see row info): Yes   Dysphagia Treatment Treatment focused on: Skilled observation of diet tolerance;Patient/family/caregiver education;Facilitation of oral preparatory phase;Facilitation of pharyngeal phase;Facilitation of oral phase Family/Caregiver Educated: Provided education on tyical course of swallowing deficits with pt.'s with dementia for future consideration. Treatment Methods/Modalities: Skilled observation Patient observed directly with PO's: Yes Type of PO's observed: Nectar-thick liquids Feeding: Total assist Liquids provided via: Cup Pharyngeal Phase Signs & Symptoms: Suspected delayed swallow initiation Type of cueing: Verbal;Tactile Amount of cueing: Maximal   Royce Macadamia M.Ed [employer] [phone] 10/29/2011

## 2011-10-30 DIAGNOSIS — E782 Mixed hyperlipidemia: Secondary | ICD-10-CM

## 2011-10-30 DIAGNOSIS — I509 Heart failure, unspecified: Secondary | ICD-10-CM

## 2011-10-30 DIAGNOSIS — N179 Acute kidney failure, unspecified: Secondary | ICD-10-CM

## 2011-10-30 DIAGNOSIS — D65 Disseminated intravascular coagulation [defibrination syndrome]: Secondary | ICD-10-CM | POA: Diagnosis present

## 2011-10-30 DIAGNOSIS — R627 Adult failure to thrive: Secondary | ICD-10-CM

## 2011-10-30 DIAGNOSIS — M545 Low back pain: Secondary | ICD-10-CM

## 2011-10-30 LAB — GLUCOSE, CAPILLARY
Glucose-Capillary: 109 mg/dL — ABNORMAL HIGH (ref 70–99)
Glucose-Capillary: 239 mg/dL — ABNORMAL HIGH (ref 70–99)
Glucose-Capillary: 162 mg/dL — ABNORMAL HIGH (ref 70–99)

## 2011-10-30 LAB — DIC (DISSEMINATED INTRAVASCULAR COAGULATION)PANEL: Prothrombin Time: 22.6 seconds — ABNORMAL HIGH (ref 11.6–15.2)

## 2011-10-30 LAB — BASIC METABOLIC PANEL
BUN: 72 mg/dL — ABNORMAL HIGH (ref 6–23)
Calcium: 8.4 mg/dL (ref 8.4–10.5)
Chloride: 106 mEq/L (ref 96–112)
Creatinine, Ser: 1.85 mg/dL — ABNORMAL HIGH (ref 0.50–1.10)
GFR calc Af Amer: 28 mL/min — ABNORMAL LOW (ref >90)
GFR calc non Af Amer: 24 mL/min — ABNORMAL LOW (ref >90)

## 2011-10-30 LAB — CBC
HCT: 39.6 % (ref 36.0–46.0)
MCHC: 32.1 g/dL (ref 30.0–36.0)
Platelets: 12 10*3/uL — CL (ref 150–400)
RDW: 21.5 % — ABNORMAL HIGH (ref 11.5–15.5)
WBC: 6.8 10*3/uL (ref 4.0–10.5)

## 2011-10-30 MED ORDER — MORPHINE SULFATE (CONCENTRATE) 20 MG/ML PO SOLN
5.0000 mg | Freq: Four times a day (QID) | ORAL | Status: DC
  Administered 2011-10-30 – 2011-11-01 (×5): 5 mg via SUBLINGUAL
  Filled 2011-10-30 (×5): qty 1

## 2011-10-30 MED ORDER — MORPHINE SULFATE (CONCENTRATE) 20 MG/ML PO SOLN: 5.0000 mg | Freq: Four times a day (QID) | ORAL | Status: DC

## 2011-10-30 MED ORDER — LORAZEPAM 2 MG/ML IJ SOLN: 0.5000 mg | Freq: Four times a day (QID) | INTRAMUSCULAR | Status: DC | PRN

## 2011-10-30 NOTE — Progress Notes (Signed)
Inpatient Diabetes Program Recommendations  AACE/ADA: New Consensus Statement on Inpatient Glycemic Control (2009)  Target Ranges:  Prepandial:   less than 140 mg/dL      Peak postprandial:   less than 180 mg/dL (1-2 hours)      Critically ill patients:  140 - 180 mg/dL   Reason for Visit: Results for ZARAYA, DELAUDER (MRN [id number]) as of 10/30/2011 12:46  Ref. Range 10/29/2011 17:38 10/29/2011 21:56 10/30/2011 07:33 10/30/2011 07:53  Glucose-Capillary Latest Range: 70-99 mg/dL 147 (H) 829 (H)  562 (H)    Inpatient Diabetes Program Recommendations Insulin - Basal: Increase Levemir to 10 units daily.  Note: Will follow.

## 2011-10-30 NOTE — Progress Notes (Signed)
[name] daughter states that rash to patient's back is worse this morning. [place], dayshift RN stated that she would let the doctor know this morning. Will continue to monitor patient.  Nelda Marseille, RN

## 2011-10-30 NOTE — Progress Notes (Signed)
[name] daughter wants patient to rest. [name] daughter requesting no vital signs. Will continue to monitor patient. Jerrye Bushy

## 2011-10-30 NOTE — Progress Notes (Signed)
ANTIBIOTIC CONSULT NOTE - FOLLOW UP  Pharmacy Consult for Vancomycin Indication: enterococcus UTI  Allergies  Allergen Reactions  . Avelox (Moxifloxacin Hcl In Nacl) Other (See Comments)    unknown  . Proton Pump Inhibitors Other (See Comments)    Thrombocytopenia  . Warfarin Sodium Other (See Comments)    Causes severe bleeding    Labs:  Basename 10/30/11 0733 10/29/11 0500 10/28/11 0420  WBC 6.8 8.5 7.1  HGB 12.7 13.0 12.4  PLT PENDING 20* 25*  LABCREA -- -- --  CREATININE 1.85* 1.73* 1.72*   Estimated Creatinine Clearance: 22 ml/min (by C-G formula based on Cr of 1.85). No results found for this basename: VANCOTROUGH:2,VANCOPEAK:2,VANCORANDOM:2,GENTTROUGH:2,GENTPEAK:2,GENTRANDOM:2,TOBRATROUGH:2,TOBRAPEAK:2,TOBRARND:2,AMIKACINPEAK:2,AMIKACINTROU:2,AMIKACIN:2, in the last 72 hours   Microbiology: Recent Results (from the past 720 hour(s))  URINE CULTURE     Status: Normal   Collection Time   10/26/11  7:56 PM      Component Value Range Status Comment   Specimen Description URINE, CATHETERIZED   Final    Special Requests ADDED [id number]   Final    Culture  Setup Time [id number]   Final    Colony Count >=100,000 COLONIES/ML   Final    Culture ENTEROCOCCUS SPECIES   Final    Report Status 10/29/2011 FINAL   Final    Organism ID, Bacteria ENTEROCOCCUS SPECIES   Final   CULTURE, BLOOD (ROUTINE X 2)     Status: Normal (Preliminary result)   Collection Time   10/26/11 10:10 PM      Component Value Range Status Comment   Specimen Description BLOOD RIGHT ARM   Final    Special Requests BOTTLES DRAWN AEROBIC AND ANAEROBIC 5CC EACH   Final    Culture  Setup Time [id number]   Final    Culture     Final    Value:        BLOOD CULTURE RECEIVED NO GROWTH TO DATE CULTURE WILL BE HELD FOR 5 DAYS BEFORE ISSUING A FINAL NEGATIVE REPORT   Report Status PENDING   Incomplete   CULTURE, BLOOD (ROUTINE X 2)     Status: Normal (Preliminary result)   Collection Time   10/26/11 10:20 PM        Component Value Range Status Comment   Specimen Description BLOOD RIGHT ARM   Final    Special Requests BOTTLES DRAWN AEROBIC AND ANAEROBIC 5cc EACH   Final    Culture  Setup Time [id number]   Final    Culture     Final    Value:        BLOOD CULTURE RECEIVED NO GROWTH TO DATE CULTURE WILL BE HELD FOR 5 DAYS BEFORE ISSUING A FINAL NEGATIVE REPORT   Report Status PENDING   Incomplete   MRSA PCR SCREENING     Status: Normal   Collection Time   10/27/11 12:47 AM      Component Value Range Status Comment   MRSA by PCR NEGATIVE  NEGATIVE Final    Assessment: 78 year old on Day 4 of vancomycin therapy for enterococcus UTI.  CrCl still low  Goal of Therapy:  Vancomycin trough level 10-15 mcg/ml  Plan:  1) Continue Vancomycin 1 Gram IV Q 48 hours 2) Continue to follow  Thank you.  Elwin Sleight 10/30/2011,8:54 AM

## 2011-10-30 NOTE — Consult Note (Signed)
Consult Note from the Palliative Medicine Team at [facility] Patient ZO:XWRUEA [name]      DOB: 13-Nov-1925      VWU:[id number]   Consult Requested by: Dr David Stall     PCP: Laurena Slimmer, MD Reason for Consultation:Goals of Care     Phone [phone]  Reviewed chart spoke with Dr Robb Matar and staff caring for patient. Daughters Steward Drone and Myrene Buddy present for the palliative care meeting. Prior to admission daughters were taking care of mother at home for many years. Patient required abdominal surgery in February of 2013 for diverticular bleed, (left hemicolectomy with L colostomy) and has had wound care nurse coming into home for follow up wound and wound vac care. According to daughters after surgery patient has had functional decline mainly bedbound, has not ambulated, and has had notable decrease in  verbalization and cognitive ability.    I also spoke with Dr Robb Matar who informed me that patient patients condition is very grave, and that recent UTI bacteremia  most likely developed in systemic sepsis with subsequent disseminated intravascular coagulation now occuring, with severe thrombocytopenia present. Dr Robb Matar feels that patient will decline rapidly and will succumb to sponataneous bleeding in hours to days and that reatment of DIC would entail aggressive measures and considering patients age, renal and cardiaccomorbidities aggressive measures would cause more harm than good. I had a candid discussion with both daughters regarding the above information and told them that their mothers demise is anticipated in short time frame and spontaneous bleeding can occur at any time. They were both agreeable to pursue comfort measures at this time, and would prefer to have mother stay in hospital and not be discharged home with hospice support.    Patient already has DNR status I reviewed Medical Orders for Medical Treatment form with daughters, completed and placed on chart.  Assessment and  Plan: 1. Code Status:DNR/DNI 2. Symptom Control:     -Lower Back Pain: patient has chronic back from DJD has taken several medications at home in small doses (Vicodin, Oxycodone, Tramadol), has been receiving IV Morphine as needed (avg 24 total past 24-48 hrs has been 3-4 mg). Will order scheduled Roxinol 5mg  every 6 hrs SL. 3. Psycho/Social: emotional support to daughters 4. Spiritual: refused chaplin consult, have family pastoral support 5. Disposition: prognosis hours-days, likely anticipate hospital death  Patient Documents Completed or Given: Document Given Completed  Advanced Directives Pkt    MOST  YES  DNR  YES  Gone from My Sight    Hard Choices      Brief HPI: 78 year old AAF had hemicolectomy with diverting colostomy for diverticular bleed 06/2011. Admitted 10/26/11 with lethargy and increased confusion, found to have ARF, acidosis, hyperkalemia and now with DIC.   ROS: Unable to verbalize effectively-did admit to lower back pain    PMH:  Past Medical History  Diagnosis Date  . Diabetes mellitus   . Hypertension   . Vascular disease   . Shortness of breath   . Bladder infection   . COPD (chronic obstructive pulmonary disease)   . Myocardial infarction   . Stroke   . GERD (gastroesophageal reflux disease)   . Diverticulitis   . CHF (congestive heart failure)   . A-fib   . DVT (deep venous thrombosis)      PSH: Past Surgical History  Procedure Date  . Bladder tack   . Femoral artery stent   . Abdominal hysterectomy   . Esophagogastroduodenoscopy 07/12/2011  Procedure: ESOPHAGOGASTRODUODENOSCOPY (EGD);  Surgeon: Theda Belfast, MD;  Location: [facility];  Service: Endoscopy;  Laterality: N/A;  bedside   . Flexible sigmoidoscopy 07/12/2011    Procedure: FLEXIBLE SIGMOIDOSCOPY;  Surgeon: Theda Belfast, MD;  Location: [facility];  Service: Endoscopy;  Laterality: N/A;  . Laparotomy 07/12/2011    Procedure: EXPLORATORY LAPAROTOMY;  Surgeon: Kandis Cocking,  MD;  Location: [facility];  Service: General;  Laterality: N/A;  exploratory laparotomy, left sigmoid colectomy, creation of colostomy  . Abdominal surgery   . Colostomy   . Eye surgery   . Ivc filter   . Cataract extraction, bilateral   . Retina reattachment surgery   . Flexible sigmoidoscopy 10/23/2011    Procedure: FLEXIBLE SIGMOIDOSCOPY;  Surgeon: Theda Belfast, MD;  Location: [facility];  Service: Endoscopy;  Laterality: N/A;   I have reviewed the FH and SH and  If appropriate update it with new information. Allergies  Allergen Reactions  . Avelox (Moxifloxacin Hcl In Nacl) Other (See Comments)    unknown  . Proton Pump Inhibitors Other (See Comments)    Thrombocytopenia  . Warfarin Sodium Other (See Comments)    Causes severe bleeding   Scheduled Meds:   . atorvastatin  20 mg Oral QHS  . famotidine  20 mg Oral QHS  . feeding supplement  1 Container Oral TID WC  . fluticasone  1 spray Each Nare Daily  . insulin aspart  0-9 Units Subcutaneous TID WC  . insulin detemir  5 Units Subcutaneous QHS  . metoprolol tartrate  12.5 mg Oral BID  . morphine  5 mg Oral Q6H  . sodium chloride  3 mL Intravenous Q12H  . sodium polystyrene  15 g Oral Once  . vancomycin  1,000 mg Intravenous Q48H   Continuous Infusions:  PRN Meds:.acetaminophen, acetaminophen, albuterol, feeding supplement, food thickener, LORazepam, morphine injection, ondansetron (ZOFRAN) IV, ondansetron    BP 118/89  Pulse 89  Temp 98.3 F (36.8 C) (Axillary)  Resp 18  Ht 5\' 3"  (1.6 m)  Wt 72.7 kg (160 lb 4.4 oz)  BMI 28.39 kg/m2  SpO2 99%   PPS: 20%   Intake/Output Summary (Last 24 hours) at 10/30/11 1342 Last data filed at 10/30/11 0434  Gross per 24 hour  Intake    360 ml  Output    350 ml  Net     10 ml   LBM: Colostomy small amt of dark brownish fluid                   Physical Exam:  General: Alert with eyes-responds with nods to simple questions HEENT:  Anicteric, petechiae on buccal  mucosa Chest: scattered coarse rhonchi throughout CVS: RRR, no MGR Abdomen: soft, wound vac drainage tube central lower abdomen, insert site intact Ext: firm 1+ edema LE from feet to mid-thighs Neuro: lethargic, arousable  Labs: CBC    Component Value Date/Time   WBC 6.8 10/30/2011 0733   WBC 9.1 08/23/2009 0819   RBC 4.66 10/30/2011 0733   RBC 4.42 08/23/2009 0819   HGB 12.7 10/30/2011 0733   HGB 12.6 08/23/2009 0819   HCT 39.6 10/30/2011 0733   HCT 38.4 08/23/2009 0819   PLT 12* 10/30/2011 0833   PLT 243 08/23/2009 0819   MCV 85.0 10/30/2011 0733   MCV 86.8 08/23/2009 0819   MCH 27.3 10/30/2011 0733   MCH 28.4 08/23/2009 0819   MCHC 32.1 10/30/2011 0733   MCHC 32.7 08/23/2009 0819  RDW 21.5* 10/30/2011 0733   RDW 16.1* 08/23/2009 0819   LYMPHSABS 0.6* 10/27/2011 0300   LYMPHSABS 1.4 08/23/2009 0819   MONOABS 0.5 10/27/2011 0300   MONOABS 0.7 08/23/2009 0819   EOSABS 0.0 10/27/2011 0300   EOSABS 0.3 08/23/2009 0819   BASOSABS 0.0 10/27/2011 0300   BASOSABS 0.0 08/23/2009 0819    BMET    Component Value Date/Time   NA 140 10/30/2011 0733   K 4.6 10/30/2011 0733   CL 106 10/30/2011 0733   CO2 17* 10/30/2011 0733   GLUCOSE 226* 10/30/2011 0733   BUN 72* 10/30/2011 0733   CREATININE 1.85* 10/30/2011 0733   CALCIUM 8.4 10/30/2011 0733   GFRNONAA 24* 10/30/2011 0733   GFRAA 28* 10/30/2011 0733    CMP     Component Value Date/Time   NA 140 10/30/2011 0733   K 4.6 10/30/2011 0733   CL 106 10/30/2011 0733   CO2 17* 10/30/2011 0733   GLUCOSE 226* 10/30/2011 0733   BUN 72* 10/30/2011 0733   CREATININE 1.85* 10/30/2011 0733   CALCIUM 8.4 10/30/2011 0733   PROT 5.9* 10/29/2011 0500   ALBUMIN 2.4* 10/29/2011 0500   AST 59* 10/29/2011 0500   ALT 46* 10/29/2011 0500   ALKPHOS 144* 10/29/2011 0500   BILITOT 1.2 10/29/2011 0500   GFRNONAA 24* 10/30/2011 0733   GFRAA 28* 10/30/2011 0733   10/27/11 CXR IMPRESSION:  Some improvement in left basilar airspace disease  Time In Time Out Total Time Spent with Patient Total  Overall Time  11:30a 1:30p 60 min 120 min    Greater than 50%  of this time was spent counseling and coordinating care related to the above assessment and plan.  Freddie Breech, CNS-C Palliative Medicine Team [facility] Team Phone: [phone] Pager: [phone]

## 2011-10-30 NOTE — Progress Notes (Addendum)
[facility] PROGRESS NOTE  [name] ZOX:[id number] DOB: 1925-12-12 DOA: 10/26/2011   Assessment/Plan: Sepsis from UTI/DIC vs respiratory infection  -She did require pressors in the unit that have since been discontinued. -Now with DIC, with multiple petechia is in her back her platelets continued to go down (Plt's 12 ) her d-dimer is elevated fibrinogen is 150, have discussed with the family the very poor prognosis she has been like to talk to Dr.Kadakia, also consulted hospice and pelvic care. - CT abd/pelvis revealed some edema of the colon - nonspecific colitis? Ischemic colitis? - continue to follow - clinically much more stable at present   Thrombocytopenia/Petichiae  -Plt dropped from 120s to 49 and now down to 10s - suspect due to infection / she has a chronic thrombocytopenia as well.  -Probable DIC  Toxic Metabolic Encephalopathy  -Due to sepsis, acidosis, ARF - CT head reveals old infarcts  - mental status has worsened today, I have discussed with her family the very poor prognosis she has.  -Enterococcus UTI:  Sensitive to Vancomycin D3. Will continue through Sunday   Acute renal failure  -Admission Cr 2,16 now down to 1.8 - due to lasix, anasarca from third spacing of fluid, poor cardiac output - much improved at this time - baseline crt reportedly ~1.4 - BUN remains quite elevated -Will defer diuresis and cardiology.  Metabolic acidosis  -Due to ARF + lactic acidosis, vitals are stable  Acute on chronic systolic HF:  -EF 30-35% via echo Sep 22, 2011 - no ACE/ARB due to renal failure. Cardiology following and ordered 1x Lasix dose today. 1.7L output yesterday. Weight 72.8kg<--75.1kg on 6/11.   ?Ischemic colitis  Doubt - tolerating diet   Protein Calorie Malnutrition  -Begin diet - advance as able - nutrition consult   Diverticulosis of colon with hemorrhage s/p left colectomy and colostomy 07/11/11   Recent GI bleeds s/p colectomy for diverticular bleed  and then s/p APC of lesion in sigmoid which was thought to be bleeding.  -Watch for bleeding with above thrombocytopenia - Hgb appears to be stable at this time   Persistent midline abdom wound  -Related to GI surgery - continue wound vac as per outpt tx plan    Code Status: DO NOT RESUSCITATE  Family Communication:  Disposition Plan:    Feliz, MD  [facility] Pager [phone]  If 7PM-7AM, please contact night-coverage [email or web address] Password [email or web address] 10/30/2011, 11:39 AM   LOS: 4 days   Antibiotics:  Vancomycin   Subjective: Patient is only responsive to pain today, she opens her eyes but is not able to answer yes or no questions. Has not eaten overnight the  Objective: Filed Vitals:   10/29/11 1554 10/29/11 2002 10/30/11 0415 10/30/11 0525  BP:  140/82 118/89   Pulse:  92 89   Temp:  98.3 F (36.8 C) 98.3 F (36.8 C)   TempSrc:  Axillary Axillary   Resp:  18 18   Height:      Weight:    72.7 kg (160 lb 4.4 oz)  SpO2: 99% 94% 99%     Intake/Output Summary (Last 24 hours) at 10/30/11 1139 Last data filed at 10/30/11 0434  Gross per 24 hour  Intake    360 ml  Output    350 ml  Net     10  ml   Weight change: -0.1 kg (-3.5 oz)  Exam:  General:  in no acute distress.  HEENT: No bruits, no goiter.  Heart: Regular  rate and rhythm, without murmurs, rubs, gallops.  Lungs: Good air movement, bilateral air movement.  Abdomen: Soft, nontender, nondistended, positive bowel sounds.    Data Reviewed: Basic Metabolic Panel:  Lab 10/30/11 1610 10/29/11 0500 10/28/11 0420 10/27/11 1010 10/27/11 0300  NA 140 139 138 134* 131*  K 4.6 5.2* -- -- --  CL 106 105 105 100 98  CO2 17* 19 21 18* 18*  GLUCOSE 226* 214* 112* 133* 173*  BUN 72* 70* 70* 73* 74*  CREATININE 1.85* 1.73* 1.72* 2.07* 2.16*  CALCIUM 8.4 8.7 8.6 8.8 8.7  MG -- -- 2.1 -- --  PHOS -- -- -- -- --   Liver Function Tests:  Lab 10/29/11 0500 10/28/11 0420 10/26/11 1848  AST 59*  58* 52*  ALT 46* 43* 31  ALKPHOS 144* 128* 142*  BILITOT 1.2 0.9 0.7  PROT 5.9* 5.7* 6.6  ALBUMIN 2.4* 2.4* 2.7*   No results found for this basename: LIPASE:5,AMYLASE:5 in the last 168 hours  Lab 10/26/11 2212  AMMONIA 26   CBC:  Lab 10/30/11 0833 10/30/11 0733 10/29/11 0500 10/28/11 0420 10/27/11 1010 10/27/11 0300 10/26/11 1848  WBC -- 6.8 8.5 7.1 14.8* 5.7 --  NEUTROABS -- -- -- -- -- 4.6 4.9  HGB -- 12.7 13.0 12.4 13.0 13.1 --  HCT -- 39.6 40.7 39.8 40.4 40.4 --  MCV -- 85.0 86.2 86.1 86.1 85.2 --  PLT 12* 12* 20* 25* 23* -- --   Cardiac Enzymes: No results found for this basename: CKTOTAL:5,CKMB:5,CKMBINDEX:5,TROPONINI:5 in the last 168 hours BNP: No components found with this basename: POCBNP:5 CBG:  Lab 10/30/11 0753 10/29/11 2156 10/29/11 1738 10/29/11 1220 10/29/11 0801  GLUCAP 239* 232* 173* 228* 222*    Recent Results (from the past 240 hour(s))  URINE CULTURE     Status: Normal   Collection Time   10/26/11  7:56 PM      Component Value Range Status Comment   Specimen Description URINE, CATHETERIZED   Final    Special Requests ADDED [id number]   Final    Culture  Setup Time [id number]   Final    Colony Count >=100,000 COLONIES/ML   Final    Culture ENTEROCOCCUS SPECIES   Final    Report Status 10/29/2011 FINAL   Final    Organism ID, Bacteria ENTEROCOCCUS SPECIES   Final   CULTURE, BLOOD (ROUTINE X 2)     Status: Normal (Preliminary result)   Collection Time   10/26/11 10:10 PM      Component Value Range Status Comment   Specimen Description BLOOD RIGHT ARM   Final    Special Requests BOTTLES DRAWN AEROBIC AND ANAEROBIC 5CC EACH   Final    Culture  Setup Time [id number]   Final    Culture     Final    Value:        BLOOD CULTURE RECEIVED NO GROWTH TO DATE CULTURE WILL BE HELD FOR 5 DAYS BEFORE ISSUING A FINAL NEGATIVE REPORT   Report Status PENDING   Incomplete   CULTURE, BLOOD (ROUTINE X 2)     Status: Normal (Preliminary result)   Collection  Time   10/26/11 10:20 PM      Component Value Range Status Comment   Specimen Description BLOOD RIGHT ARM   Final    Special Requests BOTTLES DRAWN AEROBIC AND ANAEROBIC 5[facility]   Final    Culture  Setup Time [id number]   Final    Culture  Final    Value:        BLOOD CULTURE RECEIVED NO GROWTH TO DATE CULTURE WILL BE HELD FOR 5 DAYS BEFORE ISSUING A FINAL NEGATIVE REPORT   Report Status PENDING   Incomplete   MRSA PCR SCREENING     Status: Normal   Collection Time   10/27/11 12:47 AM      Component Value Range Status Comment   MRSA by PCR NEGATIVE  NEGATIVE Final      Studies: Ct Abdomen Pelvis Wo Contrast  10/27/2011  *RADIOLOGY REPORT*  Clinical Data: Abdominal pain  CT ABDOMEN AND PELVIS WITHOUT CONTRAST  Technique:  Multidetector CT imaging of the abdomen and pelvis was performed following the standard protocol without intravenous contrast.  Comparison: 10/22/2011  Findings: Bilateral pleural effusions with associated consolidations.  Cardiomegaly.  Coronary artery calcification. Mitral annular calcification and/or replacement. Right breast thickening is again noted.  Diffuse anasarca, increased.  Organ abnormality/lesion detection is limited in the absence of intravenous contrast. The examination is further limited by streak artifact from the the patient's extremity positioning. Cannot exclude a focal liver lesion.  Unremarkable spleen.  Pancreas poorly visualized.  No appreciable change within the adrenal glands.  No biliary ductal dilatation.  Unchanged left renal cyst.  Lobular renal contours with mild perinephric fat stranding is similar to prior.  No hydronephrosis or hydroureter.  No evidence for small bowel obstruction.  Left lower quadrant colostomy.  There is a thickened segment of colon within the right abdomen (image 51 series 2).  1.4 cm oval soft tissue attenuation within the mid abdomen (image 50).  9 mm short axis round soft tissue attenuation in the left abdomen on image  54.  Foley catheter within a partially decompressed bladder. Circumferential bladder wall thickening is nonspecific.  Absent uterus.  No adnexal mass.  Small to moderate amount of free fluid. No free intraperitoneal air.  There is scattered atherosclerotic calcification of the aorta and its branches. Unchanged noncontrast appearance to the renal arteries.  IVC filter in place.  Multilevel degenerative changes of the imaged spine. No acute or aggressive appearing osseous lesion.  Grade 1 anterolisthesis of L3 on L4.  L4-5 fusion.  IMPRESSION: Bilateral pleural effusions with associated consolidations; atelectasis versus pneumonia.  Cardiomegaly with coronary artery and mitral annular calcifications.  Significantly degraded images of the abdomen due to lack of contrast and streak artifact from extremity positioning.  No bowel obstruction.  Left lower quadrant colostomy. There is a thickened segment of colon within the right abdomen of which nonspecific colitis is not excluded (infectious, inflammatory, and ischemic etiologies).  1.4 cm soft tissue attenuation within the mid abdomen may reflect an enlarged lymph node.  Metastatic disease not excluded.  Diffuse anasarca.  Small moderate amount of free fluid is similar to slightly increased in the interval.  Original Report Authenticated By: Waneta Martins, M.D.   Ct Abdomen Pelvis Wo Contrast  10/22/2011  *RADIOLOGY REPORT*  Clinical Data: GI bleed.  CT ABDOMEN AND PELVIS WITHOUT CONTRAST  Technique:  Multidetector CT imaging of the abdomen and pelvis was performed following the standard protocol without intravenous contrast.  Comparison: None  Findings: The lung bases demonstrate bilateral pleural effusions and overlying atelectasis.  The heart is enlarged.  No pericardial effusion.  Extensive coronary artery calcifications are noted.  Findings worrisome for right breast cancer with an area of fairly significant skin thickening and subareolar density which could  reflect a mass.  Recommend correlation with physical examination, mammography and  ultrasound is indicated.  The unenhanced appearance of the liver is unremarkable.  No focal lesions or biliary dilatation.  The gallbladder appears heterogeneous and may contain sludge.  No obvious common bile duct dilatation.  The pancreas is atrophied.  The spleen is normal in size.  The adrenal glands and kidneys are grossly normal.  Renal cysts are noted.  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 An IVC filter is in place.  The stomach, duodenum, small bowel and colon are grossly normal without oral contrast.  A left lower quadrant colostomy is noted.  There is a small to moderate amount of free pelvic fluid and there is diffuse body wall edema suggesting anasarca.  The bladder appears normal.  High attenuation material in the rectum is most likely blood.  IMPRESSION:  1.  Cannot exclude a right-sided breast cancer.  Recommend clinical correlation as discussed above. 2.  Bilateral pleural effusions with overlying atelectasis. 3.  High attenuation material in the rectum could reflect hemorrhage. 4.  Left lower quadrant colostomy. 5.  Small to moderate amount of free pelvic fluid. 6.  Diffuse body wall edema.  Original Report Authenticated By: P. Loralie Champagne, M.D.   X-ray Chest Pa And Lateral   10/22/2011  *RADIOLOGY REPORT*  Clinical Data: Weakness and confusion.  CHEST - 2 VIEW  Comparison: 09/22/2011.  Findings: Both views are degraded by patient positioning and low lung volumes. Lateral view degraded by patient arm position.  Midline trachea.  Cardiomegaly accentuated by AP portable technique.  Small bilateral pleural effusions are slightly increased. No pneumothorax.  Interstitial edema is slightly increased.  Moderate.  Bibasilar airspace disease.  IMPRESSION:  1.  Increasing moderate congestive heart failure. 2.  Increasing small bilateral pleural effusions. 3.  Bibasilar airspace disease, likely atelectasis.   Original Report Authenticated By: Consuello Bossier, M.D.   Dg Cervical Spine Complete  10/02/2011  *RADIOLOGY REPORT*  Clinical Data: Left neck pain status post fall.  CERVICAL SPINE - COMPLETE 4+ VIEW  Comparison: None.  Findings: Advanced multilevel degenerative changes with multilevel facet arthropathy and degenerative disc disease most pronounced at C5-6.  There is mild vertebral body height loss at C5. Otherwise maintained vertebral body height and alignment.  No prevertebral soft tissue swelling.  Maintained cranial cervical and C1-2 articulation, though the odontoid view is slightly limited by rotation.  Lung apices are clear.  Atherosclerotic calcification of the aortic arch.  IMPRESSION: Advanced multilevel degenerative changes.  Mild vertebral body height loss at C5 is age indeterminate though favored remote. Correlate with point tenderness.  Original Report Authenticated By: Waneta Martins, M.D.   Dg Lumbar Spine Complete  10/02/2011  *RADIOLOGY REPORT*  Clinical Data: Fall 1 day ago.  Left-sided pain radiating to lateral hip.  LUMBAR SPINE - COMPLETE 4+ VIEW  Comparison: Pelvis radiograph 10/02/2011 and lumbar spine radiographs 09/13/2011  Findings: Lumbar spine vertebral bodies are in stable alignment. Grade 1 anterolisthesis at L3 on L4 is unchanged.  The remainder of the lumbar spine is aligned.  Marked degenerative change with near complete loss of the disc space at L4-5 is stable.  Facet joint degenerative changes throughout the lumbar spine.  No evidence of fracture.  IVC filter projects to the right of midline, and stable position.  IMPRESSION: Stable chronic degenerative changes.  No acute bony abnormality.  Original Report Authenticated By: Britta Mccreedy, M.D.   Dg Pelvis 1-2 Views  10/02/2011  *RADIOLOGY REPORT*  Clinical Data: Status post fall 1 day ago.  Left-sided  pain radiating to left.  PELVIS - 1-2 VIEW  Comparison: .  Pelvis radiograph 07/07/2011  Findings: Both femoral heads  project over the acetabulae.  No fracture or diastasis is identified. Sacroiliac joints appear normal.  The bones appear osteopenic.  IMPRESSION: No acute bony abnormality identified.  Osteopenia.  Original Report Authenticated By: Britta Mccreedy, M.D.   Ct Head Wo Contrast  10/26/2011  *RADIOLOGY REPORT*  Clinical Data: Altered mental status.  CT HEAD WITHOUT CONTRAST  Technique:  Contiguous axial images were obtained from the base of the skull through the vertex without contrast.  Comparison: 10/22/2011  Findings: There is no acute intracranial hemorrhage, infarction, or mass lesion.  There are multiple old white matter infarcts, unchanged.  Mild diffuse atrophy.  Calcification and abnormal increased density in the right eye.  This is chronic.  No osseous abnormality.  IMPRESSION: No acute intracranial abnormality.  Multiple old lacunar  infarcts.  Original Report Authenticated By: Gwynn Burly, M.D.   Ct Head Wo Contrast  10/22/2011  *RADIOLOGY REPORT*  Clinical Data: 78 year old female with altered mental status.  CT HEAD WITHOUT CONTRAST  Technique:  Contiguous axial images were obtained from the base of the skull through the vertex without contrast.  Comparison: 10/02/2011 and earlier.  Findings: Visualized paranasal sinuses and mastoids are clear. Hyperostosis of the calvarium. No acute osseous abnormality identified.  Stable postoperative appearance of the orbits. Visualized scalp soft tissues are within normal limits.  Advanced Calcified atherosclerosis at the skull base.  Chronic left thalamic lacunar infarct.  No ventriculomegaly. No midline shift, mass effect, or evidence of mass lesion.  Chronic confluent superior frontal lobe white matter hypodensity, greater on the left (series 2 image 25, but stable. No acute intracranial hemorrhage identified.  A small cerebellar lacunar infarcts or more apparent on the most recent comparison. No evidence of cortically based acute infarction identified.  No  suspicious intracranial vascular hyperdensity.  IMPRESSION: No acute intracranial abnormality.  Chronic ischemic disease.  Original Report Authenticated By: Harley Hallmark, M.D.   Ct Head Wo Contrast  10/02/2011  *RADIOLOGY REPORT*  Clinical Data: History of fall complaining of head pain.  CT HEAD WITHOUT CONTRAST  Technique:  Contiguous axial images were obtained from the base of the skull through the vertex without contrast.  Comparison: CT head 06/04/2009.  Findings: No acute displaced skull fractures are identified.  There is mild cerebral and cerebellar atrophy.  There are several well- defined foci of decreased attenuation in the cerebellar hemispheres bilaterally, compatible with old lacunar infarctions. Similarly, there is an old lacunar infarction in the left external capsule, and left thalamus (image 13 of series 2).  Additionally, there are extensive patchy and confluent areas of decreased attenuation throughout the deep and periventricular white matter of the cerebral hemispheres bilaterally, compatible with chronic microvascular ischemic changes.  No definite acute intracranial abnormalities.  Specifically, no definite evidence of acute post- traumatic intracranial hemorrhage, no overt signs of acute/subacute ischemia (accurate assessment for this upon this background of chronic microvascular ischemic changes as it is exceedingly difficult), no focal mass, mass effect, hydrocephalus or abnormal intra or extra-axial fluid collections. Visualized paranasal sinuses and mastoids are well pneumatized. Scleral calcifications bilaterally, and increased density in the vitreous of the right globe suggesting prior vitreous hemorrhage (similar to prior).  IMPRESSION: 1.  No acute displaced skull fracture or evidence of significant acute intracranial trauma. 2.  Mild cerebral and cerebellar atrophy with old lacunar infarctions in the left external capsule, left thalamus  and bilateral cerebellar hemispheres, as  well as chronic microvascular ischemic changes throughout the white matter of the cerebral hemispheres bilaterally.  Original Report Authenticated By: Florencia Reasons, M.D.   Dg Chest Port 1 View  10/27/2011  *RADIOLOGY REPORT*  Clinical Data: Shortness of breath.  PORTABLE CHEST - 1 VIEW  Comparison: Chest 10/26/2011.  Findings: Left basilar airspace disease persist but appears mildly improved.  Right lung is clear.  No pneumothorax.  Cardiomegaly.  IMPRESSION: Some improvement in left basilar airspace disease.  Original Report Authenticated By: Bernadene Bell. D'ALESSIO, M.D.   Dg Chest Portable 1 View  10/26/2011  *RADIOLOGY REPORT*  Clinical Data: Altered mental status.  PORTABLE CHEST - 1 VIEW  Comparison: 10/22/2011  Findings: Cardiomegaly accentuated by AP portable technique. Probable small left pleural effusion, decreased.  Decreased or resolved right pleural effusion.  No pneumothorax.  Lung volumes remain low.  Improved to resolved interstitial edema.  Minimal venous congestion suspected.  Improved bibasilar aeration with mild left base air space disease remaining.  IMPRESSION: 1.  Overall improved aeration with decreased to resolved congestive heart failure.  2.  Residual small left pleural effusion with adjacent airspace disease, likely atelectasis.  Original Report Authenticated By: Consuello Bossier, M.D.   Dg Swallowing Func-no Report  10/28/2011  CLINICAL DATA: Dysphagia   FLUOROSCOPY FOR SWALLOWING FUNCTION STUDY:  Fluoroscopy was provided for swallowing function study, which was  administered by a speech pathologist.  Final results and recommendations  from this study are contained within the speech pathology report.      Scheduled Meds:   . atorvastatin  20 mg Oral QHS  . famotidine  20 mg Oral QHS  . feeding supplement  1 Container Oral TID WC  . fluticasone  1 spray Each Nare Daily  . insulin aspart  0-9 Units Subcutaneous TID WC  . insulin detemir  5 Units Subcutaneous QHS  .  metoprolol tartrate  12.5 mg Oral BID  . sodium chloride  3 mL Intravenous Q12H  . sodium polystyrene  15 g Oral Once  . vancomycin  1,000 mg Intravenous Q48H   Continuous Infusions:

## 2011-10-30 NOTE — Progress Notes (Signed)
CRITICAL VALUE ALERT  Critical value received:  Platelet count 12  Date of notification:  10/30/2011  Time of notification:  0908  Critical value read back:yes  Nurse who received alert:  Milta Deiters  MD notified (1st page):  DR. David Stall  In person  Time of first page:n/a  MD notified (2nd page):n/a  Time of second page:n/a  Responding MD: Dr. David Stall  Time MD responded:  [phone]

## 2011-10-30 NOTE — Progress Notes (Signed)
Palliative care meeting completed with daughters this afternoon note pending.   Freddie Breech, CNS-C Palliative Medicine Team [facility] Team Phone: [phone] Pager: [phone]

## 2011-10-30 NOTE — Progress Notes (Signed)
Speech Language Pathology Treatment Patient Details Name: [name] MRN: [id number] DOB: [birth year] Today's Date: 10/30/2011 Time:  -    Attempted to see pt. For dysphagia therapy.  Pt. Appears to have had a significant decline since yesterday.  Platelets dropping, poor prognosis per MD note and Palliative Care consult.  Spoke with pt.'s daughter Steward Drone who reported they just want to keep her comfortable.  She reported pt. Has been unable to eat much today due to decreased alertness.  Diet texture downgraded to Dys 1 yesterday.  Recommend pt. Consume diet if alert and desires. ST will sign off.  Thank you      Breck Coons Habeeb Puertas M.Ed [employer] [phone]  10/30/2011

## 2011-10-30 NOTE — Progress Notes (Signed)
78 y/o female admitted 10/26/2011 with lethargy, hematochezia and was found to have ARF, a UTI, lactic acidosis, hyperkalemia, and acute severe thrombocytopenia. CT abd/pelvis and exam revealed diffuse anasarca.  Currently pt with continued decline critical Plt level=12; pain control issues  s/p colectomy and colostomy for a diverticular bleed in February; recent admit 6/5 for weakness and decreased PO intake and found to be dehydrated - Cr 1.7 (baseline 1.4) (on Lasix). She had a few episodes of bleeding per rectum after the colectomy. After being given a Fleets enema on 6/7, she had bloody stool. Sigmoidoscopy done on 6/7 by Dr Elnoria Howard revealed AVMs and a rectal erosion. He preformed an APC. She was also treated for a UTI and discharged back home with Lasix.   Palliative Medicine Team consult for EOL discussion, confirm GOC requested by Dr Robb Matar; spoke with daughters Steward Drone and Myrene Buddy at bedside patient sitting at edge of bed awake, did not answer any questions occasional grimace and moan with hand rubbing abdomen - daughters agreeable to meet with PMT provider today 10/30/11 @ 11:30 am  Contacts daughter Lowella Dell h:[phone]; c: [phone]; Karlyn Agee [phone]  Valente David, RN 10/30/2011, 11:41 AM Palliative Medicine Team RN Liaison [phone]

## 2011-10-30 NOTE — Care Management Note (Unsigned)
    Page 1 of 1   10/30/2011     6:24:25 PM   CARE MANAGEMENT NOTE 10/30/2011  Patient:  [name], [name]   Account Number:  [id number]  Date Initiated:  10/27/2011  Documentation initiated by:  Donn Pierini  Subjective/Objective Assessment:   Pt admitted with AMS, encephalopathy, UTI  active with [facility] for [facility] , wound vac and has home oxygen.     Action/Plan:   PTA pt lived at home with children, assisted with ADLs   Anticipated DC Date:  11/01/2011   Anticipated DC Plan:  HOME W HOSPICE CARE      DC Planning Services  CM consult      Choice offered to / List presented to:             Status of service:  In process, will continue to follow Medicare Important Message given?   (If response is "NO", the following Medicare IM given date fields will be blank) Date Medicare IM given:   Date Additional Medicare IM given:    Discharge Disposition:    Per UR Regulation:  Reviewed for med. necessity/level of care/duration of stay  If discussed at Long Length of Stay Meetings, dates discussed:    Comments:  PCP- Myra Rude  10/30/11 Letha Cape RN, BSN [phone] patient is expected to have a hospital death.  Patient with DIC.  Spoke with Dr. Jacky Kindle he would like a CSW for this weekend to family contact to see what needs are.  Spoke with Felicity Coyer CSW she states she will have weekend CSW see patient.  6/11/1- 1515- Kristi Webster RN, BSN [phone] Pt from home was active with [facility] for HH-RN and aide, if returns home will need resumption orders at time of discharge. NCM to follow for d/c needs/planning. UR completed

## 2011-10-30 NOTE — Discharge Summary (Signed)
Speech Language Pathology Discharge Patient Details Name: [name] MRN: [id number] DOB: June 01, 1925 Today's Date: 10/30/2011 Time:  -     Patient discharged from SLP services secondary to medical decline, comfort care.  Please see latest therapy progress note for current level of functioning and progress toward goals.    Progress and discharge plan discussed with patient and/or caregiver: Patient/Caregiver agrees with plan  Roque Cash Breck Coons 10/30/2011, 2:05 PM

## 2011-10-30 NOTE — Progress Notes (Signed)
Subjective:  Awake. Significant low platelet count with petechiae.   Objective:  Vital Signs in the last 24 hours: Temp:  [98.3 F (36.8 C)] 98.3 F (36.8 C) (06/14 0415) Pulse Rate:  [89] 89  (06/14 0415) Cardiac Rhythm:  [-] Normal sinus rhythm (06/14 0759) Resp:  [18] 18  (06/14 0415) BP: (118)/(89) 118/89 mmHg (06/14 0415) SpO2:  [99 %] 99 % (06/14 0415) Weight:  [72.7 kg (160 lb 4.4 oz)] 72.7 kg (160 lb 4.4 oz) (06/14 0525)  Physical Exam: BP Readings from Last 1 Encounters:  10/30/11 118/89    Wt Readings from Last 1 Encounters:  10/30/11 72.7 kg (160 lb 4.4 oz)    Weight change: -0.1 kg (-3.5 oz)  HEENT: [place]/AT, Eyes-Brown, PERL, EOMI, Conjunctiva-Pink, Sclera-Non-icteric Neck: + JVD at 30 degree angle, No bruit, Trachea midline. Lungs:  Clear, Bilateral. Cardiac:  Regular rhythm, normal S1 and S2, no S3.  Abdomen:  Soft, non-tender. Extremities:  1 + edema present. No cyanosis. No clubbing. CNS: AxOx3, Cranial nerves grossly intact, moves all 4 extremities. Right handed. Skin: Warm and dry. Petechiae over entire back.   Intake/Output from previous day: 06/13 0701 - 06/14 0700 In: 487 [P.O.:480; I.V.:3; IV Piggyback:4] Out: 350 [Urine:250; Stool:100]    Lab Results: BMET    Component Value Date/Time   NA 140 10/30/2011 0733   K 4.6 10/30/2011 0733   CL 106 10/30/2011 0733   CO2 17* 10/30/2011 0733   GLUCOSE 226* 10/30/2011 0733   BUN 72* 10/30/2011 0733   CREATININE 1.85* 10/30/2011 0733   CALCIUM 8.4 10/30/2011 0733   GFRNONAA 24* 10/30/2011 0733   GFRAA 28* 10/30/2011 0733   CBC    Component Value Date/Time   WBC 6.8 10/30/2011 0733   WBC 9.1 08/23/2009 0819   RBC 4.66 10/30/2011 0733   RBC 4.42 08/23/2009 0819   HGB 12.7 10/30/2011 0733   HGB 12.6 08/23/2009 0819   HCT 39.6 10/30/2011 0733   HCT 38.4 08/23/2009 0819   PLT 12* 10/30/2011 0833   PLT 243 08/23/2009 0819   MCV 85.0 10/30/2011 0733   MCV 86.8 08/23/2009 0819   MCH 27.3 10/30/2011 0733   MCH 28.4 08/23/2009  0819   MCHC 32.1 10/30/2011 0733   MCHC 32.7 08/23/2009 0819   RDW 21.5* 10/30/2011 0733   RDW 16.1* 08/23/2009 0819   LYMPHSABS 0.6* 10/27/2011 0300   LYMPHSABS 1.4 08/23/2009 0819   MONOABS 0.5 10/27/2011 0300   MONOABS 0.7 08/23/2009 0819   EOSABS 0.0 10/27/2011 0300   EOSABS 0.3 08/23/2009 0819   BASOSABS 0.0 10/27/2011 0300   BASOSABS 0.0 08/23/2009 0819   CARDIAC ENZYMES Lab Results  Component Value Date   CKTOTAL 52 10/23/2011   CKMB 4.0 10/23/2011   TROPONINI <0.30 10/23/2011    Assessment/Plan:  Patient Active Hospital Problem List: Encephalopathy (10/26/2011) Acute renal failure (06/25/2011) Diabetes mellitus (06/20/2011) UTI (lower urinary tract infection) (10/22/2011) Hyperkalemia (10/26/2011) DIC (disseminated intravascular coagulation) (10/30/2011)  Prognosis poor. Daughters agree with palliative care.   LOS: 4 days    Orpah Cobb  MD  10/30/2011, 9:37 PM

## 2011-10-31 DIAGNOSIS — M545 Low back pain: Secondary | ICD-10-CM

## 2011-10-31 DIAGNOSIS — R627 Adult failure to thrive: Secondary | ICD-10-CM

## 2011-10-31 DIAGNOSIS — E782 Mixed hyperlipidemia: Secondary | ICD-10-CM

## 2011-10-31 DIAGNOSIS — N179 Acute kidney failure, unspecified: Secondary | ICD-10-CM

## 2011-10-31 DIAGNOSIS — I509 Heart failure, unspecified: Secondary | ICD-10-CM

## 2011-10-31 DIAGNOSIS — D65 Disseminated intravascular coagulation [defibrination syndrome]: Secondary | ICD-10-CM

## 2011-10-31 LAB — CBC
Hemoglobin: 13.2 g/dL (ref 12.0–15.0)
MCH: 27.6 pg (ref 26.0–34.0)
MCHC: 32.2 g/dL (ref 30.0–36.0)
RDW: 21.4 % — ABNORMAL HIGH (ref 11.5–15.5)

## 2011-10-31 LAB — GLUCOSE, CAPILLARY: Glucose-Capillary: 148 mg/dL — ABNORMAL HIGH (ref 70–99)

## 2011-10-31 MED ORDER — ALBUTEROL SULFATE (5 MG/ML) 0.5% IN NEBU
2.5000 mg | INHALATION_SOLUTION | Freq: Three times a day (TID) | RESPIRATORY_TRACT | Status: DC
  Administered 2011-10-31 – 2011-11-01 (×3): 2.5 mg via RESPIRATORY_TRACT
  Filled 2011-10-31 (×3): qty 0.5

## 2011-10-31 MED ORDER — METOPROLOL TARTRATE 12.5 MG HALF TABLET
12.5000 mg | ORAL_TABLET | Freq: Two times a day (BID) | ORAL | Status: DC
  Filled 2011-10-31 (×4): qty 1

## 2011-10-31 MED ORDER — GUAIFENESIN-DM 100-10 MG/5ML PO SYRP
5.0000 mL | ORAL_SOLUTION | ORAL | Status: DC | PRN
  Administered 2011-10-31 – 2011-11-01 (×2): 5 mL via ORAL
  Filled 2011-10-31 (×4): qty 5

## 2011-10-31 NOTE — Care Management (Signed)
Spoke to family, daughter Steward Drone and offered choice for hospice. Family did decide on [facility]. Contacted Hospice and spoke with RN on call, Thayer Ohm. They can will be able to admit patient on tomorrow. D/C has to be prior to 3 pm. Notified attending, Dr. David Stall of hospice availability for tomorrow admission. Faxed facesheet, progress note and labs, and consult for hospice on 6/15. Will need order, and d/c summary also to fax to hospice on 6/16. Waiting additional information. Notified family of d/c tomorrow with hospice. Isidoro Donning RN CCM Case Mgmt phone [phone]

## 2011-10-31 NOTE — Progress Notes (Signed)
Patient ZO:XWRUEA LILLIEANN PAVLICH      DOB: 04/11/1926      VWU:[id number]   Palliative Medicine Team at [facility] Progress Note    Subjective:  Family awake , and somewhat alert.  Daughters relate her pain is better, and her skin on her back and bottom is less read.  Patient did not speak for me but did cough wet cough while I was present .  Daughters feel that she is doing well enough that they would like to take her home with hospice if she remains stable.  Spoke Dr. David Stall.  Will at least initiate the offer of choice and see if this is feasible .  Our NP talked with daughters about potential for bleeding spontaneously which could be frightening at home, they understand and fairly capable gals.  I believe with the help of hospice they likely would manage ok.     Filed Vitals:   10/31/11 0856  BP: 158/82  Pulse: 79  Temp: 97.6 F (36.4 C)  Resp: 20   Physical exam:  Generally awake , eyes open no lip or oral lesions.  Note petechiae on extremities PERRL, mild ptosis of left eye, EOMI seem intact MMM Chest decreased bilaterally, rhochi with cough but not at rest CVS:  R,R, R S1 and S 2 present Abdomen: soft, no grimace Ext: multiple petchial lesions, swelling left greater than right hand Feet warm no mottling Neuro:  Awake, alert but not speaking for me family reports she said a few words this am and drank some water.  Lab Results  Component Value Date   WBC 6.8 10/30/2011   HGB 12.7 10/30/2011   HCT 39.6 10/30/2011   MCV 85.0 10/30/2011   PLT 12* 10/30/2011     Assessment and plan:  78 year old african [place] female s/p abdominal surgery in February  After a diverticular bleed.  Patient presented to hospital with UTI and bacteremia which progressed on to sepsis.  She developed DIC with severe thrombocytopenia.  Concern for spontaneous life limiting bleeding family elected to pursue comfort care and braced themselves for her decline.  She remain at least stably unstable today.   Pain control is much improved with scheduled Roxanol.  Family feel that they can care for her at home and desire to go home with hospice.  I have initiated the Care Manager to address offering choice with potential discharge once hospice can be set up.  1.  DNR/DNI  2 Thrombocytopenia:  No recheck on labs today.  No signs of life threatening bleeding at this time.  Patient some verbal today and drank some water.  3.  Pain in back better with scheduled Roxanol.  Total time 20 min. Discussed with Dr. David Stall and care manager  Dysen Edmondson L. Ladona Ridgel, MD MBA The Palliative Medicine Team at [facility] Phone: [phone] Pager: [phone]

## 2011-10-31 NOTE — Progress Notes (Signed)
[facility] PROGRESS NOTE  [name]:[id number] DOB: [birth year] DOA: 10/26/2011   Assessment/Plan: Ethics: -Family was explained about the very poor prognosis as the patient DIC along with with all her co morbidities.  -Her cardiologist and palliative care agree with the very poor prognosis was discussed with the patient. The family was agreeable to make the patient comfort. We stopped IV antibiotics, Lasix and all the other medications. The patient was made comfort. She is getting edematous in her extremities. Has been getting morphine for pain. -The family requested no further lab testing.  Sepsis from UTI/DIC  -She did require pressors in the ICU that have since been discontinued. -Now with DIC, with multiple petechia is in her back her platelets continued to go down (Plt's 12 ) her d-dimer is elevated fibrinogen is 150, have discussed with the family the very poor prognosis she has been like to talk to Dr.Kadakia, also consulted hospice and pelvic care. - CT abd/pelvis revealed some edema of the colon - nonspecific colitis? Ischemic colitis? - continue to follow - clinically much more stable at present . -DIC panel was obtained to show platelets of 12 fibrinogen 150 INR of 2.  Thrombocytopenia/Petichiae  -Plt dropped from 120s to 49 and now down to 10s - suspect due to infection / she has a chronic thrombocytopenia as well.   Toxic Metabolic Encephalopathy  -Due to sepsis, acidosis, ARF - CT head reveals old infarcts  -I have discussed with her family the very poor prognosis she has.  -Enterococcus UTI:  Sensitive to Vancomycin D3. Family desires to stop treatment  Acute renal failure  -No further treatment.  Metabolic acidosis  -Due to ARF + lactic acidosis, vitals are stable  Acute on chronic systolic HF:  -EF 30-35% via echo Sep 22, 2011 - no ACE/ARB due to renal failure.   ?Ischemic colitis  Doubt - tolerating diet   Protein Calorie Malnutrition  -Begin  diet - advance as able - nutrition consult   Diverticulosis of colon with hemorrhage s/p left colectomy and colostomy 07/11/11   Recent GI bleeds s/p colectomy for diverticular bleed and then s/p APC of lesion in sigmoid which was thought to be bleeding.  -Watch for bleeding with above thrombocytopenia - Hgb appears to be stable at this time   Persistent midline abdom wound  -Related to GI surgery - continue wound vac as per outpt tx plan    Code Status: DO NOT RESUSCITATE  Family Communication:  Disposition Plan:   Lambert Keto, MD  [facility] Pager [phone]  If 7PM-7AM, please contact night-coverage [email or web address] Password [email or web address] 10/31/2011, 10:28 AM   LOS: 5 days   Antibiotics:  Vancomycin   Subjective: Patient is laying comfortably in bed in no acute distress. His nonresponsive.  Objective: Filed Vitals:   10/29/11 2002 10/30/11 0415 10/30/11 0525 10/31/11 0856  BP: 140/82 118/89  158/82  Pulse: 92 89  79  Temp: 98.3 F (36.8 C) 98.3 F (36.8 C)  97.6 F (36.4 C)  TempSrc: Axillary Axillary  Axillary  Resp: 18 18  20   Height:      Weight:   72.7 kg (160 lb 4.4 oz)   SpO2: 94% 99%  96%    Intake/Output Summary (Last 24 hours) at 10/31/11 1028 Last data filed at 10/31/11 0912  Gross per 24 hour  Intake    123 ml  Output    320 ml  Net   -197 ml   Weight change:   Exam:  General:  in no acute distress.  HEENT: No bruits, no goiter.  Heart: Regular rate and rhythm, without murmurs, rubs, gallops.  Lungs: Good air movement, bilateral air movement.  Abdomen: Soft, nontender, nondistended, positive bowel sounds.  Extremities: Swelling left lower extremities.  Data Reviewed: Basic Metabolic Panel:  Lab 10/30/11 4098 10/29/11 0500 10/28/11 0420 10/27/11 1010 10/27/11 0300  NA 140 139 138 134* 131*  K 4.6 5.2* -- -- --  CL 106 105 105 100 98  CO2 17* 19 21 18* 18*  GLUCOSE 226* 214* 112* 133* 173*  BUN 72* 70* 70* 73* 74*    CREATININE 1.85* 1.73* 1.72* 2.07* 2.16*  CALCIUM 8.4 8.7 8.6 8.8 8.7  MG -- -- 2.1 -- --  PHOS -- -- -- -- --   Liver Function Tests:  Lab 10/29/11 0500 10/28/11 0420 10/26/11 1848  AST 59* 58* 52*  ALT 46* 43* 31  ALKPHOS 144* 128* 142*  BILITOT 1.2 0.9 0.7  PROT 5.9* 5.7* 6.6  ALBUMIN 2.4* 2.4* 2.7*   No results found for this basename: LIPASE:5,AMYLASE:5 in the last 168 hours  Lab 10/26/11 2212  AMMONIA 26   CBC:  Lab 10/30/11 0833 10/30/11 0733 10/29/11 0500 10/28/11 0420 10/27/11 1010 10/27/11 0300 10/26/11 1848  WBC -- 6.8 8.5 7.1 14.8* 5.7 --  NEUTROABS -- -- -- -- -- 4.6 4.9  HGB -- 12.7 13.0 12.4 13.0 13.1 --  HCT -- 39.6 40.7 39.8 40.4 40.4 --  MCV -- 85.0 86.2 86.1 86.1 85.2 --  PLT 12* 12* 20* 25* 23* -- --   Cardiac Enzymes: No results found for this basename: CKTOTAL:5,CKMB:5,CKMBINDEX:5,TROPONINI:5 in the last 168 hours BNP: No components found with this basename: POCBNP:5 CBG:  Lab 10/30/11 1245 10/30/11 0753 10/29/11 2156 10/29/11 1738 10/29/11 1220  GLUCAP 162* 239* 232* 173* 228*    Recent Results (from the past 240 hour(s))  URINE CULTURE     Status: Normal   Collection Time   10/26/11  7:56 PM      Component Value Range Status Comment   Specimen Description URINE, CATHETERIZED   Final    Special Requests ADDED [id number]   Final    Culture  Setup Time [id number]   Final    Colony Count >=100,000 COLONIES/ML   Final    Culture ENTEROCOCCUS SPECIES   Final    Report Status 10/29/2011 FINAL   Final    Organism ID, Bacteria ENTEROCOCCUS SPECIES   Final   CULTURE, BLOOD (ROUTINE X 2)     Status: Normal (Preliminary result)   Collection Time   10/26/11 10:10 PM      Component Value Range Status Comment   Specimen Description BLOOD RIGHT ARM   Final    Special Requests BOTTLES DRAWN AEROBIC AND ANAEROBIC 5CC EACH   Final    Culture  Setup Time [id number]   Final    Culture     Final    Value:        BLOOD CULTURE RECEIVED NO GROWTH TO  DATE CULTURE WILL BE HELD FOR 5 DAYS BEFORE ISSUING A FINAL NEGATIVE REPORT   Report Status PENDING   Incomplete   CULTURE, BLOOD (ROUTINE X 2)     Status: Normal (Preliminary result)   Collection Time   10/26/11 10:20 PM      Component Value Range Status Comment   Specimen Description BLOOD RIGHT ARM   Final    Special Requests BOTTLES DRAWN AEROBIC AND ANAEROBIC 5cc EACH  Final    Culture  Setup Time [id number]   Final    Culture     Final    Value:        BLOOD CULTURE RECEIVED NO GROWTH TO DATE CULTURE WILL BE HELD FOR 5 DAYS BEFORE ISSUING A FINAL NEGATIVE REPORT   Report Status PENDING   Incomplete   MRSA PCR SCREENING     Status: Normal   Collection Time   10/27/11 12:47 AM      Component Value Range Status Comment   MRSA by PCR NEGATIVE  NEGATIVE Final      Studies: Ct Abdomen Pelvis Wo Contrast  10/27/2011  *RADIOLOGY REPORT*  Clinical Data: Abdominal pain  CT ABDOMEN AND PELVIS WITHOUT CONTRAST  Technique:  Multidetector CT imaging of the abdomen and pelvis was performed following the standard protocol without intravenous contrast.  Comparison: 10/22/2011  Findings: Bilateral pleural effusions with associated consolidations.  Cardiomegaly.  Coronary artery calcification. Mitral annular calcification and/or replacement. Right breast thickening is again noted.  Diffuse anasarca, increased.  Organ abnormality/lesion detection is limited in the absence of intravenous contrast. The examination is further limited by streak artifact from the the patient's extremity positioning. Cannot exclude a focal liver lesion.  Unremarkable spleen.  Pancreas poorly visualized.  No appreciable change within the adrenal glands.  No biliary ductal dilatation.  Unchanged left renal cyst.  Lobular renal contours with mild perinephric fat stranding is similar to prior.  No hydronephrosis or hydroureter.  No evidence for small bowel obstruction.  Left lower quadrant colostomy.  There is a thickened segment of  colon within the right abdomen (image 51 series 2).  1.4 cm oval soft tissue attenuation within the mid abdomen (image 50).  9 mm short axis round soft tissue attenuation in the left abdomen on image 54.  Foley catheter within a partially decompressed bladder. Circumferential bladder wall thickening is nonspecific.  Absent uterus.  No adnexal mass.  Small to moderate amount of free fluid. No free intraperitoneal air.  There is scattered atherosclerotic calcification of the aorta and its branches. Unchanged noncontrast appearance to the renal arteries.  IVC filter in place.  Multilevel degenerative changes of the imaged spine. No acute or aggressive appearing osseous lesion.  Grade 1 anterolisthesis of L3 on L4.  L4-5 fusion.  IMPRESSION: Bilateral pleural effusions with associated consolidations; atelectasis versus pneumonia.  Cardiomegaly with coronary artery and mitral annular calcifications.  Significantly degraded images of the abdomen due to lack of contrast and streak artifact from extremity positioning.  No bowel obstruction.  Left lower quadrant colostomy. There is a thickened segment of colon within the right abdomen of which nonspecific colitis is not excluded (infectious, inflammatory, and ischemic etiologies).  1.4 cm soft tissue attenuation within the mid abdomen may reflect an enlarged lymph node.  Metastatic disease not excluded.  Diffuse anasarca.  Small moderate amount of free fluid is similar to slightly increased in the interval.  Original Report Authenticated By: Waneta Martins, M.D.   Ct Abdomen Pelvis Wo Contrast  10/22/2011  *RADIOLOGY REPORT*  Clinical Data: GI bleed.  CT ABDOMEN AND PELVIS WITHOUT CONTRAST  Technique:  Multidetector CT imaging of the abdomen and pelvis was performed following the standard protocol without intravenous contrast.  Comparison: None  Findings: The lung bases demonstrate bilateral pleural effusions and overlying atelectasis.  The heart is enlarged.  No  pericardial effusion.  Extensive coronary artery calcifications are noted.  Findings worrisome for right breast cancer with an area of  fairly significant skin thickening and subareolar density which could reflect a mass.  Recommend correlation with physical examination, mammography and ultrasound is indicated.  The unenhanced appearance of the liver is unremarkable.  No focal lesions or biliary dilatation.  The gallbladder appears heterogeneous and may contain sludge.  No obvious common bile duct dilatation.  The pancreas is atrophied.  The spleen is normal in size.  The adrenal glands and kidneys are grossly normal.  Renal cysts are noted.  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 An IVC filter is in place.  The stomach, duodenum, small bowel and colon are grossly normal without oral contrast.  A left lower quadrant colostomy is noted.  There is a small to moderate amount of free pelvic fluid and there is diffuse body wall edema suggesting anasarca.  The bladder appears normal.  High attenuation material in the rectum is most likely blood.  IMPRESSION:  1.  Cannot exclude a right-sided breast cancer.  Recommend clinical correlation as discussed above. 2.  Bilateral pleural effusions with overlying atelectasis. 3.  High attenuation material in the rectum could reflect hemorrhage. 4.  Left lower quadrant colostomy. 5.  Small to moderate amount of free pelvic fluid. 6.  Diffuse body wall edema.  Original Report Authenticated By: P. Loralie Champagne, M.D.   X-ray Chest Pa And Lateral   10/22/2011  *RADIOLOGY REPORT*  Clinical Data: Weakness and confusion.  CHEST - 2 VIEW  Comparison: 09/22/2011.  Findings: Both views are degraded by patient positioning and low lung volumes. Lateral view degraded by patient arm position.  Midline trachea.  Cardiomegaly accentuated by AP portable technique.  Small bilateral pleural effusions are slightly increased. No pneumothorax.  Interstitial edema is slightly increased.   Moderate.  Bibasilar airspace disease.  IMPRESSION:  1.  Increasing moderate congestive heart failure. 2.  Increasing small bilateral pleural effusions. 3.  Bibasilar airspace disease, likely atelectasis.  Original Report Authenticated By: Consuello Bossier, M.D.   Dg Cervical Spine Complete  10/02/2011  *RADIOLOGY REPORT*  Clinical Data: Left neck pain status post fall.  CERVICAL SPINE - COMPLETE 4+ VIEW  Comparison: None.  Findings: Advanced multilevel degenerative changes with multilevel facet arthropathy and degenerative disc disease most pronounced at C5-6.  There is mild vertebral body height loss at C5. Otherwise maintained vertebral body height and alignment.  No prevertebral soft tissue swelling.  Maintained cranial cervical and C1-2 articulation, though the odontoid view is slightly limited by rotation.  Lung apices are clear.  Atherosclerotic calcification of the aortic arch.  IMPRESSION: Advanced multilevel degenerative changes.  Mild vertebral body height loss at C5 is age indeterminate though favored remote. Correlate with point tenderness.  Original Report Authenticated By: Waneta Martins, M.D.   Dg Lumbar Spine Complete  10/02/2011  *RADIOLOGY REPORT*  Clinical Data: Fall 1 day ago.  Left-sided pain radiating to lateral hip.  LUMBAR SPINE - COMPLETE 4+ VIEW  Comparison: Pelvis radiograph 10/02/2011 and lumbar spine radiographs 09/13/2011  Findings: Lumbar spine vertebral bodies are in stable alignment. Grade 1 anterolisthesis at L3 on L4 is unchanged.  The remainder of the lumbar spine is aligned.  Marked degenerative change with near complete loss of the disc space at L4-5 is stable.  Facet joint degenerative changes throughout the lumbar spine.  No evidence of fracture.  IVC filter projects to the right of midline, and stable position.  IMPRESSION: Stable chronic degenerative changes.  No acute bony abnormality.  Original Report Authenticated By: Britta Mccreedy, M.D.  Dg Pelvis 1-2  Views  10/02/2011  *RADIOLOGY REPORT*  Clinical Data: Status post fall 1 day ago.  Left-sided pain radiating to left.  PELVIS - 1-2 VIEW  Comparison: .  Pelvis radiograph 07/07/2011  Findings: Both femoral heads project over the acetabulae.  No fracture or diastasis is identified. Sacroiliac joints appear normal.  The bones appear osteopenic.  IMPRESSION: No acute bony abnormality identified.  Osteopenia.  Original Report Authenticated By: Britta Mccreedy, M.D.   Ct Head Wo Contrast  10/26/2011  *RADIOLOGY REPORT*  Clinical Data: Altered mental status.  CT HEAD WITHOUT CONTRAST  Technique:  Contiguous axial images were obtained from the base of the skull through the vertex without contrast.  Comparison: 10/22/2011  Findings: There is no acute intracranial hemorrhage, infarction, or mass lesion.  There are multiple old white matter infarcts, unchanged.  Mild diffuse atrophy.  Calcification and abnormal increased density in the right eye.  This is chronic.  No osseous abnormality.  IMPRESSION: No acute intracranial abnormality.  Multiple old lacunar  infarcts.  Original Report Authenticated By: Gwynn Burly, M.D.   Ct Head Wo Contrast  10/22/2011  *RADIOLOGY REPORT*  Clinical Data: 78 year old female with altered mental status.  CT HEAD WITHOUT CONTRAST  Technique:  Contiguous axial images were obtained from the base of the skull through the vertex without contrast.  Comparison: 10/02/2011 and earlier.  Findings: Visualized paranasal sinuses and mastoids are clear. Hyperostosis of the calvarium. No acute osseous abnormality identified.  Stable postoperative appearance of the orbits. Visualized scalp soft tissues are within normal limits.  Advanced Calcified atherosclerosis at the skull base.  Chronic left thalamic lacunar infarct.  No ventriculomegaly. No midline shift, mass effect, or evidence of mass lesion.  Chronic confluent superior frontal lobe white matter hypodensity, greater on the left (series 2 image  25, but stable. No acute intracranial hemorrhage identified.  A small cerebellar lacunar infarcts or more apparent on the most recent comparison. No evidence of cortically based acute infarction identified.  No suspicious intracranial vascular hyperdensity.  IMPRESSION: No acute intracranial abnormality.  Chronic ischemic disease.  Original Report Authenticated By: Harley Hallmark, M.D.   Ct Head Wo Contrast  10/02/2011  *RADIOLOGY REPORT*  Clinical Data: History of fall complaining of head pain.  CT HEAD WITHOUT CONTRAST  Technique:  Contiguous axial images were obtained from the base of the skull through the vertex without contrast.  Comparison: CT head 06/04/2009.  Findings: No acute displaced skull fractures are identified.  There is mild cerebral and cerebellar atrophy.  There are several well- defined foci of decreased attenuation in the cerebellar hemispheres bilaterally, compatible with old lacunar infarctions. Similarly, there is an old lacunar infarction in the left external capsule, and left thalamus (image 13 of series 2).  Additionally, there are extensive patchy and confluent areas of decreased attenuation throughout the deep and periventricular white matter of the cerebral hemispheres bilaterally, compatible with chronic microvascular ischemic changes.  No definite acute intracranial abnormalities.  Specifically, no definite evidence of acute post- traumatic intracranial hemorrhage, no overt signs of acute/subacute ischemia (accurate assessment for this upon this background of chronic microvascular ischemic changes as it is exceedingly difficult), no focal mass, mass effect, hydrocephalus or abnormal intra or extra-axial fluid collections. Visualized paranasal sinuses and mastoids are well pneumatized. Scleral calcifications bilaterally, and increased density in the vitreous of the right globe suggesting prior vitreous hemorrhage (similar to prior).  IMPRESSION: 1.  No acute displaced skull fracture  or evidence of significant  acute intracranial trauma. 2.  Mild cerebral and cerebellar atrophy with old lacunar infarctions in the left external capsule, left thalamus and bilateral cerebellar hemispheres, as well as chronic microvascular ischemic changes throughout the white matter of the cerebral hemispheres bilaterally.  Original Report Authenticated By: Florencia Reasons, M.D.   Dg Chest Port 1 View  10/27/2011  *RADIOLOGY REPORT*  Clinical Data: Shortness of breath.  PORTABLE CHEST - 1 VIEW  Comparison: Chest 10/26/2011.  Findings: Left basilar airspace disease persist but appears mildly improved.  Right lung is clear.  No pneumothorax.  Cardiomegaly.  IMPRESSION: Some improvement in left basilar airspace disease.  Original Report Authenticated By: Bernadene Bell. D'ALESSIO, M.D.   Dg Chest Portable 1 View  10/26/2011  *RADIOLOGY REPORT*  Clinical Data: Altered mental status.  PORTABLE CHEST - 1 VIEW  Comparison: 10/22/2011  Findings: Cardiomegaly accentuated by AP portable technique. Probable small left pleural effusion, decreased.  Decreased or resolved right pleural effusion.  No pneumothorax.  Lung volumes remain low.  Improved to resolved interstitial edema.  Minimal venous congestion suspected.  Improved bibasilar aeration with mild left base air space disease remaining.  IMPRESSION: 1.  Overall improved aeration with decreased to resolved congestive heart failure.  2.  Residual small left pleural effusion with adjacent airspace disease, likely atelectasis.  Original Report Authenticated By: Consuello Bossier, M.D.   Dg Swallowing Func-no Report  10/28/2011  CLINICAL DATA: Dysphagia   FLUOROSCOPY FOR SWALLOWING FUNCTION STUDY:  Fluoroscopy was provided for swallowing function study, which was  administered by a speech pathologist.  Final results and recommendations  from this study are contained within the speech pathology report.      Scheduled Meds:    . feeding supplement  1 Container Oral TID  WC  . morphine  5 mg Sublingual Q6H  . sodium chloride  3 mL Intravenous Q12H  . DISCONTD: atorvastatin  20 mg Oral QHS  . DISCONTD: famotidine  20 mg Oral QHS  . DISCONTD: fluticasone  1 spray Each Nare Daily  . DISCONTD: insulin aspart  0-9 Units Subcutaneous TID WC  . DISCONTD: insulin detemir  5 Units Subcutaneous QHS  . DISCONTD: metoprolol tartrate  12.5 mg Oral BID  . DISCONTD: morphine  5 mg Oral Q6H  . DISCONTD: vancomycin  1,000 mg Intravenous Q48H   Continuous Infusions:

## 2011-11-01 DIAGNOSIS — I509 Heart failure, unspecified: Secondary | ICD-10-CM

## 2011-11-01 DIAGNOSIS — N179 Acute kidney failure, unspecified: Secondary | ICD-10-CM

## 2011-11-01 DIAGNOSIS — D65 Disseminated intravascular coagulation [defibrination syndrome]: Secondary | ICD-10-CM

## 2011-11-01 DIAGNOSIS — E782 Mixed hyperlipidemia: Secondary | ICD-10-CM

## 2011-11-01 MED ORDER — MORPHINE SULFATE (CONCENTRATE) 20 MG/ML PO SOLN: 5.0000 mg | Freq: Four times a day (QID) | ORAL | Status: DC

## 2011-11-01 NOTE — Progress Notes (Signed)
[name] discharged Home with hospice with foley per MD order.  Discharge instructions reviewed and discussed with daughters [name] and [name], all questions and concerns answered. Copy of instructions and scripts given to pts daughter, [name].   Sara, Mata  Home Medication Instructions [id number]   Printed on:11/01/11 1256  Medication Information                    ALPRAZolam (XANAX) 0.25 MG tablet Take 0.25 mg by mouth 3 (three) times daily as needed. anxiety           albuterol-ipratropium (COMBIVENT) 18-103 MCG/ACT inhaler Inhale 2 puffs into the lungs every 6 (six) hours as needed. For shortness of breath           digoxin ([name]) 0.125 MG tablet Take 125 mcg by mouth every other day.           metoprolol tartrate (LOPRESSOR) 25 MG tablet Take 12.5 mg by mouth 2 (two) times daily.           mometasone (NASONEX) 50 MCG/ACT nasal spray Place 2 sprays into the nose at bedtime as needed. For congestion           insulin detemir (LEVEMIR) 100 UNIT/ML injection Inject 0-15 Units into the skin See admin instructions. Patient on sliding scale in the morning and takes 15 units at bedtime           albuterol (PROVENTIL) (5 MG/ML) 0.5% nebulizer solution Take 0.5 mLs (2.5 mg total) by nebulization 3 (three) times daily.           ipratropium (ATROVENT) 0.02 % nebulizer solution Take 2.5 mLs (0.5 mg total) by nebulization 3 (three) times daily.           acetaminophen (TYLENOL) 500 MG tablet Take 1,000 mg by mouth every 6 (six) hours as needed. For pain           furosemide (LASIX) 40 MG tablet Take 40 mg by mouth daily.            morphine (ROXANOL) 20 MG/ML concentrated solution Take 0.25 mLs (5 mg total) by mouth every 6 (six) hours.             Patients skin is clean, dry and intact, no evidence of skin break down. Pt's surgical wound remains covered with wound VAC dsg, wound VAC disconnected. Pt has DIC rash to torso anterior and posterior. IV site  discontinued and catheter remains intact. Site without signs and symptoms of complications. Dressing and pressure applied.  Patient escorted home by non emergent ambulance on a stretcher, with 02 2L [place] in place, IV morphine given prior to discharge per family request for comfort while being transported home, no distress noted upon discharge.  Julien Nordmann [facility] 11/01/2011 12:56 PM

## 2011-11-01 NOTE — Discharge Summary (Signed)
Physician Discharge Summary  [name]:[id number] DOB: 1925/07/14 DOA: 10/26/2011  PCP: Laurena Slimmer, MD  Admit date: 10/26/2011 Discharge date: 11/01/2011  Discharge Diagnoses:  Principal Problem:  *Encephalopathy Active Problems:  Diabetes mellitus  Acute renal failure  UTI (lower urinary tract infection)  Hyperkalemia  DIC (disseminated intravascular coagulation)   Discharge Condition: Patient was discharged hospice and palliative care as the family wanted to take her home. Her vitals are currently stable. And her prognosis is very poor. Please see below.  Disposition:  Follow-up Information    Follow up with Laurena Slimmer, MD in 1 week.   Contact information:   [address], [address] [place] [address] [phone]          Diet: Dysphagia 1 diet  History of present illness:  78 year-old female who was just recently discharged after being treated for bleeding from the colostomy site and was found to have AVMs, and also patient was having encephalopathy from UTI was brought to the ER as patient's family found her to be increasingly confused lethargic since evening. In addition they also noticed patient increasing coughing. Patient did not loose consciousness. Did not have any nausea vomiting or diarrhea but did complain of some abdominal discomfort. In the ER patient was found to have hyperkalemia with no EKG changes and worsening renal function with acidosis. Patient has been admitted for further workup. Patient at this time is afebrile and is getting more well oriented and following commands. [name] family feels he is getting better after coming to the ER.    Hospital Course:  Principal Problem:  *Encephalopathy: Toxic infectious encephalopathy this is most likely secondary to sepsis and acidosis. This improve with the treatment of her urinary tract infection. But once the patient started developing  DIC, the patient's mentation fluctuated significantly. Hospice and palliative care was consulted they recommended the patient be comfort care. The Very poor prognosis was explain to the family. They understand and wanted the patein stabilize and transfer home.   Acute renal failure This is most likely secondary to sepsis, after aggressive hydration. The patient creatinine slight improvement but she was volume overload as he should start on Lasix. Patient to be hold secondary to third spacing from anasarca and her poor cardiac output. Her creatinine remained at 1.8. The patient started to develop in the ICU. It was discussed with the family and the family decided not to take any more labs. And continue her home dose of Lasix and take her home.  UTI (lower urinary tract infection) sepsis She was admitted to the hospital and started on broad spectrum antibiotics. She was transferred to the ICU, patient had to be started on dopamine because of her volume overload. Urine cultures came back with enterococcus, blood cultures were done which have remained negative to date. She was put on vancomycin every 48 hours secondary to her GFR. 5 days into treatment the patient developed DIC and this was discussed with the family. And the family decided to stop antibiotic treatment and make the patient comfort. They want the patient on her current medications and wanted to take home the patient as soon as possible.  DIC (disseminated intravascular coagulation) The patient was admitted to the ICU he was noted that her platelets were 50, last and she was in the hospital was around 140. A DIC panel was obtained Low fibrinogen platelets of 10 d-dimer of 10 and INR 2, she was continuing treatment and her encephalopathy worsened  her fibrinogen continued to decrease she started developing petechiae. This was explained to the family by the primary care team and her cardiologist, and due to the very poor prognoses and the patient's  comorbidities the family decided to make patient comfort. He wanted the patient stabilize and transfer home to spend her last few days at home.  Diabetes mellitus: Stable no change.  Discharge Exam: Filed Vitals:   10/31/11 1500  BP: 147/73  Pulse: 79  Temp: 98.6 F (37 C)  Resp: 20   Filed Vitals:   10/30/11 0415 10/30/11 0525 10/31/11 0856 10/31/11 1500  BP: 118/89  158/82 147/73  Pulse: 89  79 79  Temp: 98.3 F (36.8 C)  97.6 F (36.4 C) 98.6 F (37 C)  TempSrc: Axillary  Axillary Oral  Resp: 18  20 20   Height:      Weight:  72.7 kg (160 lb 4.4 oz)    SpO2: 99%  96% 100%   General: in no acute distress.  HEENT: No bruits, no goiter.  Heart: Regular rate and rhythm, without murmurs, rubs, gallops.  Lungs: Good air movement, bilateral air movement.  Abdomen: Soft, nontender, nondistended, positive bowel sounds.  Extremities: Swelling left lower extremities.  Discharge Instructions  Discharge Orders    Future Orders Please Complete By Expires   Diet - low sodium heart healthy      Increase activity slowly        Medication List  As of 11/01/2011  9:15 AM   STOP taking these medications         atorvastatin 20 MG tablet      cephALEXin 500 MG capsule      famotidine 20 MG tablet      GERITOL COMPLETE PO      ICAPS Caps      vitamin C 100 MG tablet         TAKE these medications         acetaminophen 500 MG tablet   Commonly known as: TYLENOL   Take 1,000 mg by mouth every 6 (six) hours as needed. For pain      albuterol (5 MG/ML) 0.5% nebulizer solution   Commonly known as: PROVENTIL   Take 0.5 mLs (2.5 mg total) by nebulization 3 (three) times daily.      albuterol-ipratropium 18-103 MCG/ACT inhaler   Commonly known as: COMBIVENT   Inhale 2 puffs into the lungs every 6 (six) hours as needed. For shortness of breath      ALPRAZolam 0.25 MG tablet   Commonly known as: XANAX   Take 0.25 mg by mouth 3 (three) times daily as needed. anxiety       digoxin 0.125 MG tablet   Commonly known as: [name]   Take 125 mcg by mouth every other day.      furosemide 40 MG tablet   Commonly known as: LASIX   Take 40 mg by mouth daily.      insulin detemir 100 UNIT/ML injection   Commonly known as: LEVEMIR   Inject 0-15 Units into the skin See admin instructions. Patient on sliding scale in the morning and takes 15 units at bedtime      ipratropium 0.02 % nebulizer solution   Commonly known as: ATROVENT   Take 2.5 mLs (0.5 mg total) by nebulization 3 (three) times daily.      metoprolol tartrate 25 MG tablet   Commonly known as: LOPRESSOR   Take 12.5 mg by mouth 2 (two) times daily.  mometasone 50 MCG/ACT nasal spray   Commonly known as: NASONEX   Place 2 sprays into the nose at bedtime as needed. For congestion      morphine 20 MG/ML concentrated solution   Commonly known as: ROXANOL   Take 0.25 mLs (5 mg total) by mouth every 6 (six) hours.              The results of significant diagnostics from this hospitalization (including imaging, microbiology, ancillary and laboratory) are listed below for reference.    Significant Diagnostic Studies: Ct Abdomen Pelvis Wo Contrast  10/27/2011  *RADIOLOGY REPORT*  Clinical Data: Abdominal pain  CT ABDOMEN AND PELVIS WITHOUT CONTRAST  Technique:  Multidetector CT imaging of the abdomen and pelvis was performed following the standard protocol without intravenous contrast.  Comparison: 10/22/2011  Findings: Bilateral pleural effusions with associated consolidations.  Cardiomegaly.  Coronary artery calcification. Mitral annular calcification and/or replacement. Right breast thickening is again noted.  Diffuse anasarca, increased.  Organ abnormality/lesion detection is limited in the absence of intravenous contrast. The examination is further limited by streak artifact from the the patient's extremity positioning. Cannot exclude a focal liver lesion.  Unremarkable spleen.  Pancreas poorly  visualized.  No appreciable change within the adrenal glands.  No biliary ductal dilatation.  Unchanged left renal cyst.  Lobular renal contours with mild perinephric fat stranding is similar to prior.  No hydronephrosis or hydroureter.  No evidence for small bowel obstruction.  Left lower quadrant colostomy.  There is a thickened segment of colon within the right abdomen (image 51 series 2).  1.4 cm oval soft tissue attenuation within the mid abdomen (image 50).  9 mm short axis round soft tissue attenuation in the left abdomen on image 54.  Foley catheter within a partially decompressed bladder. Circumferential bladder wall thickening is nonspecific.  Absent uterus.  No adnexal mass.  Small to moderate amount of free fluid. No free intraperitoneal air.  There is scattered atherosclerotic calcification of the aorta and its branches. Unchanged noncontrast appearance to the renal arteries.  IVC filter in place.  Multilevel degenerative changes of the imaged spine. No acute or aggressive appearing osseous lesion.  Grade 1 anterolisthesis of L3 on L4.  L4-5 fusion.  IMPRESSION: Bilateral pleural effusions with associated consolidations; atelectasis versus pneumonia.  Cardiomegaly with coronary artery and mitral annular calcifications.  Significantly degraded images of the abdomen due to lack of contrast and streak artifact from extremity positioning.  No bowel obstruction.  Left lower quadrant colostomy. There is a thickened segment of colon within the right abdomen of which nonspecific colitis is not excluded (infectious, inflammatory, and ischemic etiologies).  1.4 cm soft tissue attenuation within the mid abdomen may reflect an enlarged lymph node.  Metastatic disease not excluded.  Diffuse anasarca.  Small moderate amount of free fluid is similar to slightly increased in the interval.  Original Report Authenticated By: Waneta Martins, M.D.   Ct Abdomen Pelvis Wo Contrast  10/22/2011  *RADIOLOGY REPORT*   Clinical Data: GI bleed.  CT ABDOMEN AND PELVIS WITHOUT CONTRAST  Technique:  Multidetector CT imaging of the abdomen and pelvis was performed following the standard protocol without intravenous contrast.  Comparison: None  Findings: The lung bases demonstrate bilateral pleural effusions and overlying atelectasis.  The heart is enlarged.  No pericardial effusion.  Extensive coronary artery calcifications are noted.  Findings worrisome for right breast cancer with an area of fairly significant skin thickening and subareolar density which could reflect a mass.  Recommend correlation with physical examination, mammography and ultrasound is indicated.  The unenhanced appearance of the liver is unremarkable.  No focal lesions or biliary dilatation.  The gallbladder appears heterogeneous and may contain sludge.  No obvious common bile duct dilatation.  The pancreas is atrophied.  The spleen is normal in size.  The adrenal glands and kidneys are grossly normal.  Renal cysts are noted.  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 An IVC filter is in place.  The stomach, duodenum, small bowel and colon are grossly normal without oral contrast.  A left lower quadrant colostomy is noted.  There is a small to moderate amount of free pelvic fluid and there is diffuse body wall edema suggesting anasarca.  The bladder appears normal.  High attenuation material in the rectum is most likely blood.  IMPRESSION:  1.  Cannot exclude a right-sided breast cancer.  Recommend clinical correlation as discussed above. 2.  Bilateral pleural effusions with overlying atelectasis. 3.  High attenuation material in the rectum could reflect hemorrhage. 4.  Left lower quadrant colostomy. 5.  Small to moderate amount of free pelvic fluid. 6.  Diffuse body wall edema.  Original Report Authenticated By: P. Loralie Champagne, M.D.   X-ray Chest Pa And Lateral   10/22/2011  *RADIOLOGY REPORT*  Clinical Data: Weakness and confusion.  CHEST - 2 VIEW   Comparison: 09/22/2011.  Findings: Both views are degraded by patient positioning and low lung volumes. Lateral view degraded by patient arm position.  Midline trachea.  Cardiomegaly accentuated by AP portable technique.  Small bilateral pleural effusions are slightly increased. No pneumothorax.  Interstitial edema is slightly increased.  Moderate.  Bibasilar airspace disease.  IMPRESSION:  1.  Increasing moderate congestive heart failure. 2.  Increasing small bilateral pleural effusions. 3.  Bibasilar airspace disease, likely atelectasis.  Original Report Authenticated By: Consuello Bossier, M.D.   Dg Cervical Spine Complete  10/02/2011  *RADIOLOGY REPORT*  Clinical Data: Left neck pain status post fall.  CERVICAL SPINE - COMPLETE 4+ VIEW  Comparison: None.  Findings: Advanced multilevel degenerative changes with multilevel facet arthropathy and degenerative disc disease most pronounced at C5-6.  There is mild vertebral body height loss at C5. Otherwise maintained vertebral body height and alignment.  No prevertebral soft tissue swelling.  Maintained cranial cervical and C1-2 articulation, though the odontoid view is slightly limited by rotation.  Lung apices are clear.  Atherosclerotic calcification of the aortic arch.  IMPRESSION: Advanced multilevel degenerative changes.  Mild vertebral body height loss at C5 is age indeterminate though favored remote. Correlate with point tenderness.  Original Report Authenticated By: Waneta Martins, M.D.   Dg Lumbar Spine Complete  10/02/2011  *RADIOLOGY REPORT*  Clinical Data: Fall 1 day ago.  Left-sided pain radiating to lateral hip.  LUMBAR SPINE - COMPLETE 4+ VIEW  Comparison: Pelvis radiograph 10/02/2011 and lumbar spine radiographs 09/13/2011  Findings: Lumbar spine vertebral bodies are in stable alignment. Grade 1 anterolisthesis at L3 on L4 is unchanged.  The remainder of the lumbar spine is aligned.  Marked degenerative change with near complete loss of the  disc space at L4-5 is stable.  Facet joint degenerative changes throughout the lumbar spine.  No evidence of fracture.  IVC filter projects to the right of midline, and stable position.  IMPRESSION: Stable chronic degenerative changes.  No acute bony abnormality.  Original Report Authenticated By: Britta Mccreedy, M.D.   Dg Pelvis 1-2 Views  10/02/2011  *RADIOLOGY REPORT*  Clinical Data:  Status post fall 1 day ago.  Left-sided pain radiating to left.  PELVIS - 1-2 VIEW  Comparison: .  Pelvis radiograph 07/07/2011  Findings: Both femoral heads project over the acetabulae.  No fracture or diastasis is identified. Sacroiliac joints appear normal.  The bones appear osteopenic.  IMPRESSION: No acute bony abnormality identified.  Osteopenia.  Original Report Authenticated By: Britta Mccreedy, M.D.   Ct Head Wo Contrast  10/26/2011  *RADIOLOGY REPORT*  Clinical Data: Altered mental status.  CT HEAD WITHOUT CONTRAST  Technique:  Contiguous axial images were obtained from the base of the skull through the vertex without contrast.  Comparison: 10/22/2011  Findings: There is no acute intracranial hemorrhage, infarction, or mass lesion.  There are multiple old white matter infarcts, unchanged.  Mild diffuse atrophy.  Calcification and abnormal increased density in the right eye.  This is chronic.  No osseous abnormality.  IMPRESSION: No acute intracranial abnormality.  Multiple old lacunar  infarcts.  Original Report Authenticated By: Gwynn Burly, M.D.   Ct Head Wo Contrast  10/22/2011  *RADIOLOGY REPORT*  Clinical Data: 78 year old female with altered mental status.  CT HEAD WITHOUT CONTRAST  Technique:  Contiguous axial images were obtained from the base of the skull through the vertex without contrast.  Comparison: 10/02/2011 and earlier.  Findings: Visualized paranasal sinuses and mastoids are clear. Hyperostosis of the calvarium. No acute osseous abnormality identified.  Stable postoperative appearance of the  orbits. Visualized scalp soft tissues are within normal limits.  Advanced Calcified atherosclerosis at the skull base.  Chronic left thalamic lacunar infarct.  No ventriculomegaly. No midline shift, mass effect, or evidence of mass lesion.  Chronic confluent superior frontal lobe white matter hypodensity, greater on the left (series 2 image 25, but stable. No acute intracranial hemorrhage identified.  A small cerebellar lacunar infarcts or more apparent on the most recent comparison. No evidence of cortically based acute infarction identified.  No suspicious intracranial vascular hyperdensity.  IMPRESSION: No acute intracranial abnormality.  Chronic ischemic disease.  Original Report Authenticated By: Harley Hallmark, M.D.   Ct Head Wo Contrast  10/02/2011  *RADIOLOGY REPORT*  Clinical Data: History of fall complaining of head pain.  CT HEAD WITHOUT CONTRAST  Technique:  Contiguous axial images were obtained from the base of the skull through the vertex without contrast.  Comparison: CT head 06/04/2009.  Findings: No acute displaced skull fractures are identified.  There is mild cerebral and cerebellar atrophy.  There are several well- defined foci of decreased attenuation in the cerebellar hemispheres bilaterally, compatible with old lacunar infarctions. Similarly, there is an old lacunar infarction in the left external capsule, and left thalamus (image 13 of series 2).  Additionally, there are extensive patchy and confluent areas of decreased attenuation throughout the deep and periventricular white matter of the cerebral hemispheres bilaterally, compatible with chronic microvascular ischemic changes.  No definite acute intracranial abnormalities.  Specifically, no definite evidence of acute post- traumatic intracranial hemorrhage, no overt signs of acute/subacute ischemia (accurate assessment for this upon this background of chronic microvascular ischemic changes as it is exceedingly difficult), no focal mass,  mass effect, hydrocephalus or abnormal intra or extra-axial fluid collections. Visualized paranasal sinuses and mastoids are well pneumatized. Scleral calcifications bilaterally, and increased density in the vitreous of the right globe suggesting prior vitreous hemorrhage (similar to prior).  IMPRESSION: 1.  No acute displaced skull fracture or evidence of significant acute intracranial trauma. 2.  Mild cerebral and cerebellar atrophy with old lacunar  infarctions in the left external capsule, left thalamus and bilateral cerebellar hemispheres, as well as chronic microvascular ischemic changes throughout the white matter of the cerebral hemispheres bilaterally.  Original Report Authenticated By: Florencia Reasons, M.D.   Dg Chest Port 1 View  10/27/2011  *RADIOLOGY REPORT*  Clinical Data: Shortness of breath.  PORTABLE CHEST - 1 VIEW  Comparison: Chest 10/26/2011.  Findings: Left basilar airspace disease persist but appears mildly improved.  Right lung is clear.  No pneumothorax.  Cardiomegaly.  IMPRESSION: Some improvement in left basilar airspace disease.  Original Report Authenticated By: Bernadene Bell. D'ALESSIO, M.D.   Dg Chest Portable 1 View  10/26/2011  *RADIOLOGY REPORT*  Clinical Data: Altered mental status.  PORTABLE CHEST - 1 VIEW  Comparison: 10/22/2011  Findings: Cardiomegaly accentuated by AP portable technique. Probable small left pleural effusion, decreased.  Decreased or resolved right pleural effusion.  No pneumothorax.  Lung volumes remain low.  Improved to resolved interstitial edema.  Minimal venous congestion suspected.  Improved bibasilar aeration with mild left base air space disease remaining.  IMPRESSION: 1.  Overall improved aeration with decreased to resolved congestive heart failure.  2.  Residual small left pleural effusion with adjacent airspace disease, likely atelectasis.  Original Report Authenticated By: Consuello Bossier, M.D.   Dg Swallowing Func-no Report  10/28/2011  CLINICAL  DATA: Dysphagia   FLUOROSCOPY FOR SWALLOWING FUNCTION STUDY:  Fluoroscopy was provided for swallowing function study, which was  administered by a speech pathologist.  Final results and recommendations  from this study are contained within the speech pathology report.      Microbiology: Recent Results (from the past 240 hour(s))  URINE CULTURE     Status: Normal   Collection Time   10/26/11  7:56 PM      Component Value Range Status Comment   Specimen Description URINE, CATHETERIZED   Final    Special Requests ADDED [id number]   Final    Culture  Setup Time [id number]   Final    Colony Count >=100,000 COLONIES/ML   Final    Culture ENTEROCOCCUS SPECIES   Final    Report Status 10/29/2011 FINAL   Final    Organism ID, Bacteria ENTEROCOCCUS SPECIES   Final   CULTURE, BLOOD (ROUTINE X 2)     Status: Normal (Preliminary result)   Collection Time   10/26/11 10:10 PM      Component Value Range Status Comment   Specimen Description BLOOD RIGHT ARM   Final    Special Requests BOTTLES DRAWN AEROBIC AND ANAEROBIC 5CC EACH   Final    Culture  Setup Time [id number]   Final    Culture     Final    Value:        BLOOD CULTURE RECEIVED NO GROWTH TO DATE CULTURE WILL BE HELD FOR 5 DAYS BEFORE ISSUING A FINAL NEGATIVE REPORT   Report Status PENDING   Incomplete   CULTURE, BLOOD (ROUTINE X 2)     Status: Normal (Preliminary result)   Collection Time   10/26/11 10:20 PM      Component Value Range Status Comment   Specimen Description BLOOD RIGHT ARM   Final    Special Requests BOTTLES DRAWN AEROBIC AND ANAEROBIC 5cc EACH   Final    Culture  Setup Time [id number]   Final    Culture     Final    Value:        BLOOD CULTURE RECEIVED NO GROWTH TO DATE  CULTURE WILL BE HELD FOR 5 DAYS BEFORE ISSUING A FINAL NEGATIVE REPORT   Report Status PENDING   Incomplete   MRSA PCR SCREENING     Status: Normal   Collection Time   10/27/11 12:47 AM      Component Value Range Status Comment   MRSA by PCR  NEGATIVE  NEGATIVE Final      Labs: Basic Metabolic Panel:  Lab 10/30/11 4098 10/29/11 0500 10/28/11 0420 10/27/11 1010 10/27/11 0300  NA 140 139 138 134* 131*  K 4.6 5.2* -- -- --  CL 106 105 105 100 98  CO2 17* 19 21 18* 18*  GLUCOSE 226* 214* 112* 133* 173*  BUN 72* 70* 70* 73* 74*  CREATININE 1.85* 1.73* 1.72* 2.07* 2.16*  CALCIUM 8.4 8.7 8.6 8.8 8.7  MG -- -- 2.1 -- --  PHOS -- -- -- -- --   Liver Function Tests:  Lab 10/29/11 0500 10/28/11 0420 10/26/11 1848  AST 59* 58* 52*  ALT 46* 43* 31  ALKPHOS 144* 128* 142*  BILITOT 1.2 0.9 0.7  PROT 5.9* 5.7* 6.6  ALBUMIN 2.4* 2.4* 2.7*   No results found for this basename: LIPASE:5,AMYLASE:5 in the last 168 hours  Lab 10/26/11 2212  AMMONIA 26   CBC:  Lab 10/31/11 1330 10/30/11 0833 10/30/11 0733 10/29/11 0500 10/28/11 0420 10/27/11 1010 10/27/11 0300 10/26/11 1848  WBC 9.1 -- 6.8 8.5 7.1 14.8* -- --  NEUTROABS -- -- -- -- -- -- 4.6 4.9  HGB 13.2 -- 12.7 13.0 12.4 13.0 -- --  HCT 41.0 -- 39.6 40.7 39.8 40.4 -- --  MCV 85.8 -- 85.0 86.2 86.1 86.1 -- --  PLT 10* 12* 12* 20* 25* -- -- --   Cardiac Enzymes: No results found for this basename: CKTOTAL:5,CKMB:5,CKMBINDEX:5,TROPONINI:5 in the last 168 hours BNP: No components found with this basename: POCBNP:5 CBG:  Lab 10/31/11 1755 10/30/11 1245 10/30/11 0753 10/29/11 2156 10/29/11 1738  GLUCAP 148* 162* 239* 232* 173*    Time coordinating discharge: greater then 40 minutes  Signed:  Marinda Elk  [facility] 11/01/2011, 9:15 AM

## 2011-11-01 NOTE — Progress Notes (Signed)
   CARE MANAGEMENT NOTE 11/01/2011  Patient:  [name], [name]   Account Number:  [id number]  Date Initiated:  10/27/2011  Documentation initiated by:  Donn Pierini  Subjective/Objective Assessment:   Pt admitted with AMS, encephalopathy, UTI  active with [facility] for [facility] , wound vac and has home oxygen.     Action/Plan:   PTA pt lived at home with children, assisted with ADLs   Anticipated DC Date:  11/01/2011   Anticipated DC Plan:  HOME W HOSPICE CARE      DC Planning Services  CM consult      PAC Choice  HOSPICE   Choice offered to / List presented to:  C-4 Adult Children           Status of service:  Completed, signed off Medicare Important Message given?   (If response is "NO", the following Medicare IM given date fields will be blank) Date Medicare IM given:   Date Additional Medicare IM given:    Discharge Disposition:  HOME W HOSPICE CARE  Per UR Regulation:  Reviewed for med. necessity/level of care/duration of stay  If discussed at Long Length of Stay Meetings, dates discussed:    Comments:  11/01/2011 1000 Spoke to hospice and they aware of pt's d/c today. Unit RN will instruct family to contact Hospice when they arrive home. Faxed d/c summary and order to hospice. SW will arrange transportation. Isidoro Donning RN CCM Case Mgmt phone [phone]  10/31/2011 1745  Spoke to family, daughter Steward Drone and offered choice for hospice. Family did decide on [facility]. Contacted Hospice and spoke with RN on call, Thayer Ohm. They can will be able to admit patient on tomorrow. D/C has to be prior to 3 pm. Notified attending, Dr. David Stall of hospice availability for tomorrow admission. Faxed facesheet, progress note and labs, and consult for hospice on 6/15. Will need order, and d/c summary also to fax to hospice on 6/16. Waiting additional information. Notified family of d/c tomorrow with hospice. Isidoro Donning RN CCM Case Mgmt phone  [phone]     PCP- Myra Rude  10/30/11 Letha Cape RN, BSN [phone] patient is expected to have a hospital death.  Patient with DIC.  Spoke with Dr. Jacky Kindle he would like a CSW for this weekend to family contact to see what needs are.  Spoke with Felicity Coyer CSW she states she will have weekend CSW see patient.  6/11/1- 1515- Kristi Webster RN, BSN [phone] Pt from home was active with [facility] for HH-RN and aide, if returns home will need resumption orders at time of discharge. NCM to follow for d/c needs/planning. UR completed

## 2011-11-01 NOTE — Progress Notes (Signed)
Orders given by Dr. David Stall to discharge pt home with foley. Pt going home with hospice. Julien Nordmann [facility]

## 2011-11-02 LAB — CULTURE, BLOOD (ROUTINE X 2)
Culture  Setup Time: 201306110459
Culture  Setup Time: 201306110500
Culture: NO GROWTH

## 2011-11-17 ENCOUNTER — Inpatient Hospital Stay (HOSPITAL_COMMUNITY)

## 2011-11-17 ENCOUNTER — Encounter (HOSPITAL_COMMUNITY): Payer: Self-pay

## 2011-11-17 ENCOUNTER — Inpatient Hospital Stay (HOSPITAL_COMMUNITY)
Admission: EM | Admit: 2011-11-17 | Discharge: 2011-11-20 | DRG: 291 | Disposition: A | Discharge status: Hospice | Source: Ambulatory Visit | Attending: Internal Medicine | Admitting: Internal Medicine

## 2011-11-17 DIAGNOSIS — G9341 Metabolic encephalopathy: Secondary | ICD-10-CM | POA: Diagnosis present

## 2011-11-17 DIAGNOSIS — R0600 Dyspnea, unspecified: Secondary | ICD-10-CM | POA: Diagnosis present

## 2011-11-17 DIAGNOSIS — Z8673 Personal history of transient ischemic attack (TIA), and cerebral infarction without residual deficits: Secondary | ICD-10-CM

## 2011-11-17 DIAGNOSIS — I509 Heart failure, unspecified: Principal | ICD-10-CM | POA: Diagnosis present

## 2011-11-17 DIAGNOSIS — N390 Urinary tract infection, site not specified: Secondary | ICD-10-CM | POA: Diagnosis present

## 2011-11-17 DIAGNOSIS — I5022 Chronic systolic (congestive) heart failure: Secondary | ICD-10-CM

## 2011-11-17 DIAGNOSIS — N179 Acute kidney failure, unspecified: Secondary | ICD-10-CM | POA: Diagnosis present

## 2011-11-17 DIAGNOSIS — Z86718 Personal history of other venous thrombosis and embolism: Secondary | ICD-10-CM

## 2011-11-17 DIAGNOSIS — D696 Thrombocytopenia, unspecified: Secondary | ICD-10-CM | POA: Diagnosis present

## 2011-11-17 DIAGNOSIS — I5041 Acute combined systolic (congestive) and diastolic (congestive) heart failure: Secondary | ICD-10-CM

## 2011-11-17 DIAGNOSIS — Z66 Do not resuscitate: Secondary | ICD-10-CM | POA: Diagnosis present

## 2011-11-17 DIAGNOSIS — E119 Type 2 diabetes mellitus without complications: Secondary | ICD-10-CM | POA: Diagnosis present

## 2011-11-17 DIAGNOSIS — K219 Gastro-esophageal reflux disease without esophagitis: Secondary | ICD-10-CM | POA: Diagnosis present

## 2011-11-17 DIAGNOSIS — I5021 Acute systolic (congestive) heart failure: Secondary | ICD-10-CM | POA: Diagnosis present

## 2011-11-17 DIAGNOSIS — D72829 Elevated white blood cell count, unspecified: Secondary | ICD-10-CM | POA: Diagnosis present

## 2011-11-17 DIAGNOSIS — R0902 Hypoxemia: Secondary | ICD-10-CM

## 2011-11-17 DIAGNOSIS — N189 Chronic kidney disease, unspecified: Secondary | ICD-10-CM | POA: Diagnosis present

## 2011-11-17 DIAGNOSIS — G9340 Encephalopathy, unspecified: Secondary | ICD-10-CM | POA: Diagnosis present

## 2011-11-17 DIAGNOSIS — I4891 Unspecified atrial fibrillation: Secondary | ICD-10-CM | POA: Diagnosis present

## 2011-11-17 DIAGNOSIS — I10 Essential (primary) hypertension: Secondary | ICD-10-CM | POA: Diagnosis present

## 2011-11-17 LAB — CBC WITH DIFFERENTIAL/PLATELET
Basophils Absolute: 0 10*3/uL (ref 0.0–0.1)
Eosinophils Absolute: 0 10*3/uL (ref 0.0–0.7)
Hemoglobin: 12.9 g/dL (ref 12.0–15.0)
Lymphocytes Relative: 5 % — ABNORMAL LOW (ref 12–46)
MCHC: 31.4 g/dL (ref 30.0–36.0)
Neutrophils Relative %: 92 % — ABNORMAL HIGH (ref 43–77)
Platelets: 86 10*3/uL — ABNORMAL LOW (ref 150–400)
RDW: 20.2 % — ABNORMAL HIGH (ref 11.5–15.5)

## 2011-11-17 LAB — POCT I-STAT, CHEM 8
HCT: 44 % (ref 36.0–46.0)
Hemoglobin: 15 g/dL (ref 12.0–15.0)
Potassium: 4.1 mEq/L (ref 3.5–5.1)
Sodium: 136 mEq/L (ref 135–145)
TCO2: 12 mmol/L (ref 0–100)

## 2011-11-17 LAB — URINALYSIS, MICROSCOPIC ONLY
Glucose, UA: NEGATIVE mg/dL
Specific Gravity, Urine: 1.014 (ref 1.005–1.030)
pH: 5 (ref 5.0–8.0)

## 2011-11-17 MED ORDER — FLUTICASONE PROPIONATE 50 MCG/ACT NA SUSP
1.0000 | Freq: Every day | NASAL | Status: DC
  Administered 2011-11-18 – 2011-11-20 (×3): 1 via NASAL
  Filled 2011-11-17: qty 16

## 2011-11-17 MED ORDER — ONDANSETRON HCL 4 MG PO TABS: 4.0000 mg | ORAL_TABLET | Freq: Four times a day (QID) | ORAL | Status: DC | PRN

## 2011-11-17 MED ORDER — ACETAMINOPHEN 325 MG PO TABS: 650.0000 mg | ORAL_TABLET | Freq: Four times a day (QID) | ORAL | Status: DC | PRN

## 2011-11-17 MED ORDER — ALBUTEROL SULFATE (5 MG/ML) 0.5% IN NEBU
2.5000 mg | INHALATION_SOLUTION | Freq: Three times a day (TID) | RESPIRATORY_TRACT | Status: DC
  Administered 2011-11-17 – 2011-11-19 (×6): 2.5 mg via RESPIRATORY_TRACT
  Filled 2011-11-17 (×6): qty 0.5

## 2011-11-17 MED ORDER — METOPROLOL TARTRATE 12.5 MG HALF TABLET: 12.5000 mg | ORAL_TABLET | Freq: Two times a day (BID) | ORAL | Status: DC

## 2011-11-17 MED ORDER — ACETAMINOPHEN 650 MG RE SUPP
650.0000 mg | Freq: Four times a day (QID) | RECTAL | Status: DC | PRN
  Administered 2011-11-18 (×2): 650 mg via RECTAL
  Filled 2011-11-17 (×2): qty 1

## 2011-11-17 MED ORDER — IPRATROPIUM BROMIDE 0.02 % IN SOLN
0.5000 mg | Freq: Three times a day (TID) | RESPIRATORY_TRACT | Status: DC
  Administered 2011-11-17 – 2011-11-19 (×6): 0.5 mg via RESPIRATORY_TRACT
  Filled 2011-11-17 (×6): qty 2.5

## 2011-11-17 MED ORDER — DIGOXIN 125 MCG PO TABS
125.0000 ug | ORAL_TABLET | ORAL | Status: DC
  Filled 2011-11-17: qty 1

## 2011-11-17 MED ORDER — ALPRAZOLAM 0.25 MG PO TABS: 0.2500 mg | ORAL_TABLET | Freq: Three times a day (TID) | ORAL | Status: DC | PRN

## 2011-11-17 MED ORDER — CHLORHEXIDINE GLUCONATE 0.12 % MT SOLN
15.0000 mL | Freq: Two times a day (BID) | OROMUCOSAL | Status: DC
  Administered 2011-11-18 – 2011-11-20 (×5): 15 mL via OROMUCOSAL
  Filled 2011-11-17 (×8): qty 15

## 2011-11-17 MED ORDER — BIOTENE DRY MOUTH MT LIQD: 15.0000 mL | Freq: Two times a day (BID) | OROMUCOSAL | Status: DC

## 2011-11-17 MED ORDER — VITAMIN C 500 MG PO TABS
500.0000 mg | ORAL_TABLET | Freq: Every day | ORAL | Status: DC
  Filled 2011-11-17 (×2): qty 1

## 2011-11-17 MED ORDER — FUROSEMIDE 10 MG/ML IJ SOLN
40.0000 mg | Freq: Every day | INTRAMUSCULAR | Status: DC
  Administered 2011-11-17 – 2011-11-18 (×2): 40 mg via INTRAVENOUS
  Filled 2011-11-17 (×3): qty 4

## 2011-11-17 MED ORDER — MORPHINE SULFATE (CONCENTRATE) 20 MG/ML PO SOLN
5.0000 mg | Freq: Four times a day (QID) | ORAL | Status: DC
  Administered 2011-11-17 – 2011-11-18 (×2): 5 mg via ORAL
  Filled 2011-11-17 (×2): qty 1

## 2011-11-17 MED ORDER — BIOTENE DRY MOUTH MT LIQD
15.0000 mL | Freq: Two times a day (BID) | OROMUCOSAL | Status: DC
  Administered 2011-11-18 – 2011-11-19 (×3): 15 mL via OROMUCOSAL

## 2011-11-17 MED ORDER — INSULIN DETEMIR 100 UNIT/ML ~~LOC~~ SOLN
5.0000 [IU] | Freq: Every day | SUBCUTANEOUS | Status: DC
  Filled 2011-11-17: qty 10

## 2011-11-17 MED ORDER — METOPROLOL TARTRATE 12.5 MG HALF TABLET
12.5000 mg | ORAL_TABLET | Freq: Two times a day (BID) | ORAL | Status: DC
  Filled 2011-11-17 (×5): qty 1

## 2011-11-17 MED ORDER — MORPHINE SULFATE 2 MG/ML IJ SOLN: 1.0000 mg | INTRAMUSCULAR | Status: DC | PRN

## 2011-11-17 MED ORDER — INSULIN DETEMIR 100 UNIT/ML ~~LOC~~ SOLN: 0.0000 [IU] | SUBCUTANEOUS | Status: DC

## 2011-11-17 MED ORDER — IPRATROPIUM-ALBUTEROL 18-103 MCG/ACT IN AERO
2.0000 | INHALATION_SPRAY | Freq: Four times a day (QID) | RESPIRATORY_TRACT | Status: DC | PRN
  Filled 2011-11-17: qty 14.7

## 2011-11-17 MED ORDER — ONDANSETRON HCL 4 MG/2ML IJ SOLN: 4.0000 mg | Freq: Four times a day (QID) | INTRAMUSCULAR | Status: DC | PRN

## 2011-11-17 NOTE — H&P (Addendum)
[facility] History and Physical  [name] UJW:[id number] DOB: Sep 05, 1925 DOA: 11/17/2011   PCP: Laurena Slimmer, MD   Chief Complaint: brought in by family members for dyspnea.  HPI:  78 year old lady recently discharged home with hospice and comfort care, brought in by daughters after an episode where the patient chocked on a pill, and it was followed by worsening shortness of breath. Most of the history was obtained from the family members at bedside, as she is non verbal. The family members deny any fever or chills, . She had two episodes of vomiting over teh last 2 days. Occasional cough on robitussin. No other complaints.   Review of Systems:  Could not be obtained as patient is non verbal.   Past Medical History  Diagnosis Date  . Diabetes mellitus   . Hypertension   . Vascular disease   . Shortness of breath   . Bladder infection   . COPD (chronic obstructive pulmonary disease)   . Myocardial infarction   . Stroke   . GERD (gastroesophageal reflux disease)   . Diverticulitis   . CHF (congestive heart failure)   . A-fib   . DVT (deep venous thrombosis)    Past Surgical History  Procedure Date  . Bladder tack   . Femoral artery stent   . Abdominal hysterectomy   . Esophagogastroduodenoscopy 07/12/2011    Procedure: ESOPHAGOGASTRODUODENOSCOPY (EGD);  Surgeon: Theda Belfast, MD;  Location: [facility];  Service: Endoscopy;  Laterality: N/A;  bedside   . Flexible sigmoidoscopy 07/12/2011    Procedure: FLEXIBLE SIGMOIDOSCOPY;  Surgeon: Theda Belfast, MD;  Location: [facility];  Service: Endoscopy;  Laterality: N/A;  . Laparotomy 07/12/2011    Procedure: EXPLORATORY LAPAROTOMY;  Surgeon: Kandis Cocking, MD;  Location: [facility] ORS;  Service: General;  Laterality: N/A;  exploratory laparotomy, left sigmoid colectomy, creation of colostomy  . Abdominal surgery   . Colostomy   . Eye surgery   . Ivc filter   . Cataract extraction, bilateral   . Retina reattachment  surgery   . Flexible sigmoidoscopy 10/23/2011    Procedure: FLEXIBLE SIGMOIDOSCOPY;  Surgeon: Theda Belfast, MD;  Location: [facility];  Service: Endoscopy;  Laterality: N/A;   Social History:  reports that she has quit smoking. She has never used smokeless tobacco. She reports that she does not drink alcohol or use illicit drugs.  Allergies  Allergen Reactions  . Avelox (Moxifloxacin Hcl In Nacl) Other (See Comments)    unknown  . Proton Pump Inhibitors Other (See Comments)    Thrombocytopenia  . Warfarin Sodium Other (See Comments)    Causes severe bleeding    No family history on file.  Prior to Admission medications   Medication Sig Start Date End Date Taking? Authorizing Provider  albuterol (PROVENTIL) (5 MG/ML) 0.5% nebulizer solution Take 0.5 mLs (2.5 mg total) by nebulization 3 (three) times daily. 09/15/11 09/14/12 Yes Lesle Chris Black, NP  albuterol-ipratropium (COMBIVENT) 18-103 MCG/ACT inhaler Inhale 2 puffs into the lungs every 6 (six) hours as needed. For shortness of breath   Yes Historical Provider, MD  ALPRAZolam (XANAX) 0.25 MG tablet Take 0.25 mg by mouth 3 (three) times daily as needed. anxiety   Yes Historical Provider, MD  digoxin ([name]) 0.125 MG tablet Take 125 mcg by mouth every other day.   Yes Historical Provider, MD  furosemide (LASIX) 40 MG tablet Take 40 mg by mouth daily.  09/15/11  Yes Gwenyth Bender, NP  insulin detemir (  LEVEMIR) 100 UNIT/ML injection Inject 0-15 Units into the skin See admin instructions. Patient on sliding scale in the morning and takes 15 units at bedtime   Yes Historical Provider, MD  ipratropium (ATROVENT) 0.02 % nebulizer solution Take 2.5 mLs (0.5 mg total) by nebulization 3 (three) times daily. 09/15/11 09/14/12 Yes Lesle Chris Black, NP  metoprolol tartrate (LOPRESSOR) 25 MG tablet Take 12.5 mg by mouth 2 (two) times daily.   Yes Historical Provider, MD  mometasone (NASONEX) 50 MCG/ACT nasal spray Place 2 sprays into the nose at bedtime as  needed. For congestion   Yes Historical Provider, MD  morphine (ROXANOL) 20 MG/ML concentrated solution Take 0.25 mLs (5 mg total) by mouth every 6 (six) hours. 11/01/11  Yes Marinda Elk, MD  Multiple Vitamins-Minerals (ICAPS) CAPS Take 1 capsule by mouth 2 (two) times daily.   Yes Historical Provider, MD  OVER THE COUNTER MEDICATION Take 1 tablet by mouth 2 (two) times daily. Gerital Vitamin   Yes Historical Provider, MD  vitamin C (ASCORBIC ACID) 500 MG tablet Take 500 mg by mouth daily.   Yes Historical Provider, MD   Physical Exam: Filed Vitals:   11/17/11 1820 11/17/11 1830 11/17/11 1833 11/17/11 2102  BP: 138/67  132/69 127/62  Pulse: 114   102  Temp: 99.1 F (37.3 C)   97.5 F (36.4 C)  TempSrc: Axillary   Axillary  Resp: 18  18 18   Height:    5\' 3"  (1.6 m)  Weight:    68 kg (149 lb 14.6 oz)  SpO2: 99% 100% 100% 99%    Constitutional: Vital signs reviewed.  Patient is a well-developed and POORLY-nourished  in no acute distress. Non responsive. Head: Normocephalic and atraumatic Mouth: no erythema or exudates, MMM Eyes: PERRL, EOMI, conjunctivae normal, No scleral icterus.  Neck: Supple, Trachea midline normal ROM, Cardiovascular: RRR, S1 normal, S2 normal, no MRG, Pulmonary/Chest: , decreased air entry at bases, coarse crackles bilateral. Abdominal: Soft. Non-tender, non-distended, bowel sounds are normal, colostomy bad, and a wound vac for the umbilical wound.  Musculoskeletal: pedal edema 2+, cold extremities.   Neurological: pt is non verbal and is encephalopathic. Skin: cold extremities.    Labs on Admission:  Basic Metabolic Panel:  Lab 11/17/11 1610  NA 136  K 4.1  CL 113*  CO2 --  GLUCOSE 195*  BUN 59*  CREATININE 1.90*  CALCIUM --  MG --  PHOS --   Liver Function Tests: No results found for this basename: AST:5,ALT:5,ALKPHOS:5,BILITOT:5,PROT:5,ALBUMIN:5 in the last 168 hours No results found for this basename: LIPASE:5,AMYLASE:5 in the last 168  hours No results found for this basename: AMMONIA:5 in the last 168 hours CBC:  Lab 11/17/11 2023 11/17/11 2009  WBC -- 12.7*  NEUTROABS -- 11.7*  HGB 15.0 12.9  HCT 44.0 41.1  MCV -- 86.5  PLT -- 86*   Cardiac Enzymes:  Lab 11/17/11 2010  CKTOTAL --  CKMB --  CKMBINDEX --  TROPONINI <0.30   BNP: No components found with this basename: POCBNP:5 CBG: No results found for this basename: GLUCAP:5 in the last 168 hours  Radiological Exams on Admission: Dg Chest Port 1 View  11/17/2011  *RADIOLOGY REPORT*  Clinical Data: Respiratory distress, shortness of breath.  PORTABLE CHEST - 1 VIEW  Comparison: 10/27/2011  Findings: There is cardiomegaly.  Diffuse bilateral airspace opacities are noted, likely edema.  Small to moderate layering bilateral effusions.  Mitral valve annular calcifications.  No acute bony abnormality.  IMPRESSION: Moderate  CHF.  Original Report Authenticated By: Cyndie Chime, M.D.    EKG: PENDING.  Assessment/Plan Active Problems: 1. CHF Exacerbation: CXR shows moderate CHF and crackles on exam, with pedal edema. Admit to telemetry Start patient on low dose lasix secondary to renal insufficiency I/O's,  Repeat CXR in 1 to 2 days. Nebs as needed roxanol as needed. [name] family does not want IV morphine Oxygen as needed to make her comfortable. Resume b blockers and digoxin.    2. DM Resume levamir [name] family wants to resume SSI. Soft pureed diet with honey thick liquids  3. Thrombocytopenia: Chronic. Continue to monitor No anticoagulants  4. Leukocytosis: CXR does not show pneumonia. No records of fever. Will get UA.  5. Acute on Chronic Renal insufficiency: Continue to monitor.   6 DVT prophylaxis scd's   Code Status: DNR. Family Communication: spoke to daughters at bedside Disposition Plan: home   Enedina Pair, MD  [facility] Pager [phone]  If 7PM-7AM, please contact  night-coverage [email or web address] Password [facility] 11/17/2011, 9:17 PM

## 2011-11-17 NOTE — ED Notes (Signed)
Per family, pt aspirated prior to having respiratory distress.  Also, pt had Lasix 32mg  IV with EMS.

## 2011-11-17 NOTE — ED Provider Notes (Signed)
History     CSN: [id number]  Arrival date & time 11/17/11  2130   First MD Initiated Contact with Patient 11/17/11 1819      Chief Complaint  Patient presents with  . Respiratory Distress    Pt BIB [facility] for respiratory difficulty.     Level V caveat due to altered mental status. (Consider location/radiation/quality/duration/timing/severity/associated sxs/prior treatment) The history is provided by the patient.   patient was brought in by EMS for respiratory difficulty. Reportedly had sats in the 70s. She is Comfort Care only patient. She has a history of CHF and recent encephalopathy.  Past Medical History  Diagnosis Date  . Diabetes mellitus   . Hypertension   . Vascular disease   . Shortness of breath   . Bladder infection   . COPD (chronic obstructive pulmonary disease)   . Myocardial infarction   . Stroke   . GERD (gastroesophageal reflux disease)   . Diverticulitis   . CHF (congestive heart failure)   . A-fib   . DVT (deep venous thrombosis)     Past Surgical History  Procedure Date  . Bladder tack   . Femoral artery stent   . Abdominal hysterectomy   . Esophagogastroduodenoscopy 07/12/2011    Procedure: ESOPHAGOGASTRODUODENOSCOPY (EGD);  Surgeon: Theda Belfast, MD;  Location: [facility];  Service: Endoscopy;  Laterality: N/A;  bedside   . Flexible sigmoidoscopy 07/12/2011    Procedure: FLEXIBLE SIGMOIDOSCOPY;  Surgeon: Theda Belfast, MD;  Location: [facility];  Service: Endoscopy;  Laterality: N/A;  . Laparotomy 07/12/2011    Procedure: EXPLORATORY LAPAROTOMY;  Surgeon: Kandis Cocking, MD;  Location: [facility];  Service: General;  Laterality: N/A;  exploratory laparotomy, left sigmoid colectomy, creation of colostomy  . Abdominal surgery   . Colostomy   . Eye surgery   . Ivc filter   . Cataract extraction, bilateral   . Retina reattachment surgery   . Flexible sigmoidoscopy 10/23/2011    Procedure: FLEXIBLE SIGMOIDOSCOPY;  Surgeon: Theda Belfast, MD;   Location: [facility];  Service: Endoscopy;  Laterality: N/A;    No family history on file.  History  Substance Use Topics  . Smoking status: Former Games developer  . Smokeless tobacco: Never Used  . Alcohol Use: No    OB History    Grav Para Term Preterm Abortions TAB SAB Ect Mult Living                  Review of Systems  Unable to perform ROS: Mental status change    Allergies  Avelox; Proton pump inhibitors; and Warfarin sodium  Home Medications   Current Outpatient Rx  Name Route Sig Dispense Refill  . ACETAMINOPHEN 500 MG PO TABS Oral Take 1,000 mg by mouth every 6 (six) hours as needed. For pain    . ALBUTEROL SULFATE (5 MG/ML) 0.5% IN NEBU Nebulization Take 0.5 mLs (2.5 mg total) by nebulization 3 (three) times daily. 20 mL 0  . IPRATROPIUM-ALBUTEROL 18-103 MCG/ACT IN AERO Inhalation Inhale 2 puffs into the lungs every 6 (six) hours as needed. For shortness of breath    . ALPRAZOLAM 0.25 MG PO TABS Oral Take 0.25 mg by mouth 3 (three) times daily as needed. anxiety    . DIGOXIN 0.125 MG PO TABS Oral Take 125 mcg by mouth every other day.    . FUROSEMIDE 40 MG PO TABS Oral Take 40 mg by mouth daily.     . INSULIN DETEMIR 100 UNIT/ML [place] SOLN  Subcutaneous Inject 0-15 Units into the skin See admin instructions. Patient on sliding scale in the morning and takes 15 units at bedtime    . IPRATROPIUM BROMIDE 0.02 % IN SOLN Nebulization Take 2.5 mLs (0.5 mg total) by nebulization 3 (three) times daily. 75 mL 0  . METOPROLOL TARTRATE 25 MG PO TABS Oral Take 12.5 mg by mouth 2 (two) times daily.    . MOMETASONE FUROATE 50 MCG/ACT NA SUSP Nasal Place 2 sprays into the nose at bedtime as needed. For congestion    . MORPHINE SULFATE (CONCENTRATE) 20 MG/ML PO SOLN Oral Take 0.25 mLs (5 mg total) by mouth every 6 (six) hours. 100 mL 0    BP 132/69  Pulse 114  Temp 99.1 F (37.3 C) (Axillary)  Resp 18  SpO2 100%  Physical Exam  Constitutional: She appears well-developed.  Eyes:        Pupils are somewhat sluggish.  Neck: Neck supple.  Cardiovascular:       Tachycardia.  Pulmonary/Chest: She has rales.       Diffuse rails in all lung fields. Shallow frequent respirations. There are periods of apnea.  Abdominal: There is no tenderness.       Patient has a colostomy and inferior wound VAC.  Musculoskeletal:       Bilateral lower extremity pitting edema.  Neurological:       [name] no unresponsive and nonverbal.    ED Course  Procedures (including critical care time)  Labs Reviewed - No data to display No results found.   1. Hypoxia   2. Dyspnea   3. CHF (congestive heart failure)       MDM  Patient was brought in for respiratory distress. She is Comfort Care only patient at this time. The form was no antibiotics. No IV fluids except an IV as needed for comfort. [name] oxygenation has improved on a nonrebreather mask. She'll be admitted to medicine for comfort care.        Juliet Rude. Rubin Payor, MD 11/17/11 [phone]

## 2011-11-17 NOTE — Progress Notes (Signed)
Dr. Blake Divine at bedside. Stated to discontinue SQ Lovenox. Will monitor. C.Latrelle Bazar, RN.

## 2011-11-18 DIAGNOSIS — G9340 Encephalopathy, unspecified: Secondary | ICD-10-CM

## 2011-11-18 DIAGNOSIS — I5021 Acute systolic (congestive) heart failure: Secondary | ICD-10-CM

## 2011-11-18 DIAGNOSIS — N390 Urinary tract infection, site not specified: Secondary | ICD-10-CM

## 2011-11-18 LAB — BASIC METABOLIC PANEL
BUN: 67 mg/dL — ABNORMAL HIGH (ref 6–23)
Calcium: 7.7 mg/dL — ABNORMAL LOW (ref 8.4–10.5)
Creatinine, Ser: 1.41 mg/dL — ABNORMAL HIGH (ref 0.50–1.10)
GFR calc Af Amer: 39 mL/min — ABNORMAL LOW (ref >90)
GFR calc non Af Amer: 33 mL/min — ABNORMAL LOW (ref >90)
Glucose, Bld: 139 mg/dL — ABNORMAL HIGH (ref 70–99)
Potassium: 5.3 mEq/L — ABNORMAL HIGH (ref 3.5–5.1)

## 2011-11-18 LAB — GLUCOSE, CAPILLARY
Glucose-Capillary: 92 mg/dL (ref 70–99)
Glucose-Capillary: 89 mg/dL (ref 70–99)

## 2011-11-18 MED ORDER — FUROSEMIDE 10 MG/ML IJ SOLN
40.0000 mg | Freq: Two times a day (BID) | INTRAMUSCULAR | Status: DC
  Administered 2011-11-18 – 2011-11-19 (×2): 40 mg via INTRAVENOUS
  Filled 2011-11-18 (×5): qty 4

## 2011-11-18 MED ORDER — SODIUM POLYSTYRENE SULFONATE 15 GM/60ML PO SUSP
15.0000 g | Freq: Once | ORAL | Status: AC
  Administered 2011-11-18: 15 g via RECTAL
  Filled 2011-11-18: qty 60

## 2011-11-18 MED ORDER — MORPHINE SULFATE (CONCENTRATE) 20 MG/ML PO SOLN: 5.0000 mg | ORAL | Status: DC | PRN

## 2011-11-18 MED ORDER — DEXTROSE 5 % IV SOLN
1.0000 g | INTRAVENOUS | Status: DC
  Administered 2011-11-18 – 2011-11-19 (×2): 1 g via INTRAVENOUS
  Filled 2011-11-18 (×3): qty 10

## 2011-11-18 MED ORDER — GLUCERNA SHAKE PO LIQD: 237.0000 mL | Freq: Three times a day (TID) | ORAL | Status: DC | PRN

## 2011-11-18 NOTE — Progress Notes (Signed)
Pt is essentially unresponsive at this time. Spoke with K.Kirby, NP about the patient's PO medications and she stated it was okay to hold all PO meds tonight. Will continue to monitor. C.Morton Simson, RN.

## 2011-11-18 NOTE — Progress Notes (Addendum)
INITIAL ADULT NUTRITION ASSESSMENT Date: 11/18/2011   Time: 11:10 AM  Reason for Assessment: Nutrition Risk Report, Low [place]aden  INTERVENTION:  Glucerna Shake 3 times daily PRN (thicken to appropriate consistency)  RD to follow for nutrition care plan  ASSESSMENT: [name]  Dx: Acute systolic CHF (congestive heart failure)  Hx:  Past Medical History  Diagnosis Date  . Diabetes mellitus   . Hypertension   . Vascular disease   . Shortness of breath   . Bladder infection   . COPD (chronic obstructive pulmonary disease)   . Myocardial infarction   . Stroke   . GERD (gastroesophageal reflux disease)   . Diverticulitis   . CHF (congestive heart failure)   . A-fib   . DVT (deep venous thrombosis)     Related Meds:     . albuterol  2.5 mg Nebulization TID  . antiseptic oral rinse  15 mL Mouth Rinse q12n4p  . chlorhexidine  15 mL Mouth Rinse BID  . digoxin  125 mcg Oral QODAY  . fluticasone  1 spray Each Nare Daily  . furosemide  40 mg Intravenous Daily  . ipratropium  0.5 mg Nebulization TID  . metoprolol tartrate  12.5 mg Oral BID  . morphine  5 mg Oral Q6H  . vitamin C  500 mg Oral Daily  . DISCONTD: antiseptic oral rinse  15 mL Mouth Rinse BID  . DISCONTD: insulin detemir  0-15 Units Subcutaneous See admin instructions  . DISCONTD: insulin detemir  5 Units Subcutaneous QHS  . DISCONTD: metoprolol tartrate  12.5 mg Oral BID    Ht: 5\' 3"  (160 cm)  Wt: 149 lb 14.6 oz (68 kg)  Ideal Wt: 52.2 kg % Ideal Wt: 130%  Usual Wt: 140 lb -- February 2013 % Usual Wt: 106%  Body mass index is 26.56 kg/(m^2).  Food/Nutrition Related Hx: unintentional weight loss > 10 lbs within the past month & problems chewing or swallowing foods and/or liquids per admission nutrition screen  Labs:  CMP     Component Value Date/Time   NA 139 11/18/2011 0500   K 5.3* 11/18/2011 0500   CL 103 11/18/2011 0500   CO2 24 11/18/2011 0500   GLUCOSE 139* 11/18/2011 0500   BUN 67* 11/18/2011  0500   CREATININE 1.41* 11/18/2011 0500   CALCIUM 7.7* 11/18/2011 0500   PROT 5.9* 10/29/2011 0500   ALBUMIN 2.4* 10/29/2011 0500   AST 59* 10/29/2011 0500   ALT 46* 10/29/2011 0500   ALKPHOS 144* 10/29/2011 0500   BILITOT 1.2 10/29/2011 0500   GFRNONAA 33* 11/18/2011 0500   GFRAA 39* 11/18/2011 0500     Intake/Output Summary (Last 24 hours) at 11/18/11 1111 Last data filed at 11/18/11 1000  Gross per 24 hour  Intake      0 ml  Output    625 ml  Net   -625 ml    CBG (last 3)   Basename 11/18/11 0739 11/17/11 2155  GLUCAP 121* 179*    Diet Order: Dysphagia 1, honey thick liquids  Supplements/Tube Feeding: N/A  IVF: N/A  Estimated Nutritional Needs:   Kcal: 1500-1700 Protein: 70-80 gm Fluid: 1.5-1.7 L  Patient admitted for dyspnea; noted recently discharged home with hospice; patient is non-verbal; RD spoke with patient's daughters regarding nutrition hx; states at home she eats baby food; PTA appetite was "pretty good;" report weight loss recently due to fluid loss; patient with visible muscle loss to temples, upper body (shoulders & clavicles); will occasionally  drink Glucerna Shakes; Stage I pressure ulcer presence to coccyx & wound VAC to midline abdomen; RD to order supplements as needed -- daughters amenable.  Patient meets criteria for severe malnutrition in the context of chronic illness given < 75% intake of estimated energy requirement for > 1 month and severe muscle loss  NUTRITION DIAGNOSIS: -Inadequate oral intake (NI-2.1).  Status: Ongoing  RELATED TO: dysphagia & dyspnea   AS EVIDENCE BY: PO intake 0%  MONITORING/EVALUATION(Goals): Goal: Increase quality of life with added supplement of choice Monitor: Goals of care, supplement acceptance  EDUCATION NEEDS: -No education needs identified at this time  Dietitian #: [id number]  DOCUMENTATION CODES Per approved criteria  -Severe malnutrition in the context of chronic illness    [name] 11/18/2011, 11:10 AM

## 2011-11-18 NOTE — Progress Notes (Signed)
Nursing Admission Note  Pt arrived to [id number] via stretcher. Several family members at bedside. Dr. Blake Divine also at bedside. Pt is essentially unresponsive; she occasionally opens her eyes, but family states that she is legally blind. Pt is bedbound; no voluntary movement of any extremities noted. Telemetry in place. NRB mask in place. Foley catheter in place. Stage 1 pressure ulcer noted on coccyx - EPC applied. Dime-sized, scabbed wound on left lateral foot - left OTA. Wound vac noted on midline abdomen; currently pt's wound vac from home. WOC consult requested. LUQ colostomy also noted and intact. Small, hard, and reddened area noted on LPFA. NSL location changed secondary to this. Pt also has a small, pea-sized open area noted next to her anus. Family states this is from an old surgery. Pt's daughter Cyndra Numbers will be staying here overnight - oriented to room and surroundings. Will continue to monitor. C.Adonai Selsor, RN.

## 2011-11-18 NOTE — Progress Notes (Signed)
Subjective: Minimally responsive to painful stimuli only, weak gag reflex  Objective: Vital signs in last 24 hours: Temp:  [97.5 F (36.4 C)-99.1 F (37.3 C)] 97.6 F (36.4 C) (07/03 0939) Pulse Rate:  [92-114] 104  (07/03 0939) Resp:  [18-20] 20  (07/03 0939) BP: (105-138)/(49-69) 116/62 mmHg (07/03 0939) SpO2:  [96 %-100 %] 100 % (07/03 0939) FiO2 (%):  [100 %] 100 % (07/02 2144) Weight:  [68 kg (149 lb 14.6 oz)] 68 kg (149 lb 14.6 oz) (07/02 2102) Weight change:  Last BM Date: 11/18/11  Intake/Output from previous day: 07/02 0701 - 07/03 0700 In: -  Out: 500 [Urine:500] Total I/O In: 0  Out: 125 [Urine:125]   Physical Exam: General: unresponsive, withdraws to deep painful stimuli only. HEENT: No bruits, no goiter. Heart: Regular rate and rhythm, tachycardic, systolic murmur murmurs, rubs, gallops. Lungs: scattered rhonchi bilaterally Abdomen: Soft, nontender, nondistended, positive bowel sounds. Extremities:positive edema  No clubbing cyanosis with positive pedal pulses. Neuro: unable to assess due to mentation.   Lab Results: Basic Metabolic Panel:  Basename 11/18/11 0500 11/17/11 2023  NA 139 136  K 5.3* 4.1  CL 103 113*  CO2 24 --  GLUCOSE 139* 195*  BUN 67* 59*  CREATININE 1.41* 1.90*  CALCIUM 7.7* --  MG -- --  PHOS -- --   Liver Function Tests: No results found for this basename: AST:2,ALT:2,ALKPHOS:2,BILITOT:2,PROT:2,ALBUMIN:2 in the last 72 hours No results found for this basename: LIPASE:2,AMYLASE:2 in the last 72 hours No results found for this basename: AMMONIA:2 in the last 72 hours CBC:  Basename 11/17/11 2023 11/17/11 2009  WBC -- 12.7*  NEUTROABS -- 11.7*  HGB 15.0 12.9  HCT 44.0 41.1  MCV -- 86.5  PLT -- 86*   Cardiac Enzymes:  Basename 11/17/11 2010  CKTOTAL --  CKMB --  CKMBINDEX --  TROPONINI <0.30   BNP: No results found for this basename: PROBNP:3 in the last 72 hours D-Dimer: No results found for this basename:  DDIMER:2 in the last 72 hours CBG:  Basename 11/18/11 0739 11/17/11 2155  GLUCAP 121* 179*   Hemoglobin A1C: No results found for this basename: HGBA1C in the last 72 hours Fasting Lipid Panel: No results found for this basename: CHOL,HDL,LDLCALC,TRIG,CHOLHDL,LDLDIRECT in the last 72 hours Thyroid Function Tests: No results found for this basename: TSH,T4TOTAL,FREET4,T3FREE,THYROIDAB in the last 72 hours Anemia Panel: No results found for this basename: VITAMINB12,FOLATE,FERRITIN,TIBC,IRON,RETICCTPCT in the last 72 hours Coagulation: No results found for this basename: LABPROT:2,INR:2 in the last 72 hours Urine Drug Screen: Drugs of Abuse  No results found for this basename: labopia, cocainscrnur, labbenz, amphetmu, thcu, labbarb    Alcohol Level: No results found for this basename: ETH:2 in the last 72 hours Urinalysis:  Basename 11/17/11 2128  COLORURINE YELLOW  LABSPEC 1.014  PHURINE 5.0  GLUCOSEU NEGATIVE  HGBUR SMALL*  BILIRUBINUR NEGATIVE  KETONESUR NEGATIVE  PROTEINUR 30*  UROBILINOGEN 0.2  NITRITE NEGATIVE  LEUKOCYTESUR LARGE*    No results found for this or any previous visit (from the past 240 hour(s)).  Studies/Results: Dg Chest Port 1 View  11/17/2011  *RADIOLOGY REPORT*  Clinical Data: Respiratory distress, shortness of breath.  PORTABLE CHEST - 1 VIEW  Comparison: 10/27/2011  Findings: There is cardiomegaly.  Diffuse bilateral airspace opacities are noted, likely edema.  Small to moderate layering bilateral effusions.  Mitral valve annular calcifications.  No acute bony abnormality.  IMPRESSION: Moderate CHF.  Original Report Authenticated By: Cyndie Chime, M.D.  Medications: Scheduled Meds:   . albuterol  2.5 mg Nebulization TID  . antiseptic oral rinse  15 mL Mouth Rinse q12n4p  . cefTRIAXone (ROCEPHIN)  IV  1 g Intravenous Q24H  . chlorhexidine  15 mL Mouth Rinse BID  . digoxin  125 mcg Oral QODAY  . fluticasone  1 spray Each Nare Daily  .  furosemide  40 mg Intravenous Daily  . ipratropium  0.5 mg Nebulization TID  . metoprolol tartrate  12.5 mg Oral BID  . morphine  5 mg Oral Q6H  . vitamin C  500 mg Oral Daily  . DISCONTD: antiseptic oral rinse  15 mL Mouth Rinse BID  . DISCONTD: insulin detemir  0-15 Units Subcutaneous See admin instructions  . DISCONTD: insulin detemir  5 Units Subcutaneous QHS  . DISCONTD: metoprolol tartrate  12.5 mg Oral BID   Continuous Infusions:  PRN Meds:.acetaminophen, acetaminophen, albuterol-ipratropium, ALPRAZolam, ondansetron (ZOFRAN) IV, ondansetron, DISCONTD:  morphine injection  Assessment/Plan: 1. CHF Exacerbation: ECho 5/13 with EF of 30% with mod MS and severe MR, on home hospice Increase lasix to BID I/O's,  roxanol as needed.  Oxygen  Resume b blockers and digoxin.   2. Metabolic encephalopathy:  From 1, UTI NPo till mentation improves  3. UTI: start IV rocephin, FU urine cultures  4. DM  Continue levamir, SSI  Hold off on diet till mentation improves   3. Thrombocytopenia:  Chronic.  Continue to monitor  No anticoagulants   4. Leukocytosis: could be from UTI  5. Acute on Chronic Renal insufficiency:  Continue to monitor.   6 DVT prophylaxis  scd's  Code Status: DNR, was discharged home with hospice, MOST form filled 2 weeks ago   Left message for daughter today      LOS: 1 day   [facility] Pager: [phone] 11/18/2011, 11:46 AM

## 2011-11-18 NOTE — Progress Notes (Signed)
Room 6736 - [name] -   [facility] RN Visit  CC: SOB, pt choked on pill (? Aspiration). Adm dx: Acute systolic CHF.  Related admission to [facility] diagnosis of MetabolicEncephalopathy.  Pt is DNR code.    Pt non responsive, open mouthed breathing on NRB mask @ 15L, eyes open with dilated pupils and no blink response, lying in bed, without evidence of pain or discomfort.    No family present.    Please call [facility] @ [phone] with any hospice needs.  Thank you.  Joneen Boers, RN  [facility]  Hospice Liaison

## 2011-11-18 NOTE — Progress Notes (Signed)
Family was not present.  Pt had eyes open but did not respond.   I introduced myself and blessed the patient quietly.  I asked pt's nurse to let the family know I had been by and that they can ask for me to be paged if they need my support.  I will continue to follow.  Please page me if I am needed or requested. Dellie Catholic [phone]  Personal pager   [phone]  oncall pager  11/18/11 1142  Clinical Encounter Type  Visited With Patient  Visit Type Initial  Referral From Palliative care team  Spiritual Encounters  Spiritual Needs Grief support  Stress Factors  Patient Stress Factors Major life changes  Family Stress Factors Major life changes

## 2011-11-19 LAB — BASIC METABOLIC PANEL
CO2: 27 mEq/L (ref 19–32)
Calcium: 8.3 mg/dL — ABNORMAL LOW (ref 8.4–10.5)
Creatinine, Ser: 1.63 mg/dL — ABNORMAL HIGH (ref 0.50–1.10)
Glucose, Bld: 90 mg/dL (ref 70–99)

## 2011-11-19 MED ORDER — IPRATROPIUM BROMIDE 0.02 % IN SOLN
0.5000 mg | Freq: Three times a day (TID) | RESPIRATORY_TRACT | Status: DC
  Administered 2011-11-19 – 2011-11-20 (×2): 0.5 mg via RESPIRATORY_TRACT
  Filled 2011-11-19 (×3): qty 2.5

## 2011-11-19 MED ORDER — MORPHINE SULFATE (CONCENTRATE) 20 MG/ML PO SOLN
5.0000 mg | ORAL | Status: DC | PRN
  Administered 2011-11-20: 5 mg via ORAL
  Filled 2011-11-19 (×2): qty 1

## 2011-11-19 MED ORDER — ALBUTEROL SULFATE (5 MG/ML) 0.5% IN NEBU
2.5000 mg | INHALATION_SOLUTION | Freq: Three times a day (TID) | RESPIRATORY_TRACT | Status: DC
  Administered 2011-11-19 – 2011-11-20 (×2): 2.5 mg via RESPIRATORY_TRACT
  Filled 2011-11-19 (×3): qty 0.5

## 2011-11-19 MED ORDER — FUROSEMIDE 10 MG/ML IJ SOLN
60.0000 mg | Freq: Two times a day (BID) | INTRAMUSCULAR | Status: DC
  Administered 2011-11-19 – 2011-11-20 (×2): 60 mg via INTRAVENOUS
  Filled 2011-11-19 (×4): qty 6

## 2011-11-19 NOTE — Progress Notes (Signed)
Subjective: Minimally responsive to deep painful stimuli only, weak gag reflex, still on non rebreather mask  Objective: Vital signs in last 24 hours: Temp:  [97.4 F (36.3 C)-97.8 F (36.6 C)] 97.4 F (36.3 C) (07/04 0545) Pulse Rate:  [90-104] 98  (07/04 0545) Resp:  [20] 20  (07/04 0545) BP: (99-122)/(57-65) 115/57 mmHg (07/04 0545) SpO2:  [100 %] 100 % (07/04 0545) FiO2 (%):  [100 %] 100 % (07/04 0904) Weight:  [68 kg (149 lb 14.6 oz)] 68 kg (149 lb 14.6 oz) (07/03 2038) Weight change: 0 kg (0 lb) Last BM Date: 11/18/11  Intake/Output from previous day: 07/03 0701 - 07/04 0700 In: 58 [IV Piggyback:58] Out: 385 [Urine:385]     Physical Exam: General: unresponsive, withdraws to deep painful stimuli only. HEENT: No bruits, no goiter. Heart: Regular rate and rhythm, tachycardic, systolic murmur murmurs, rubs, gallops. Lungs: scattered rhonchi bilaterally Abdomen: Soft, nontender, nondistended, positive bowel sounds. Extremities:positive edema  No clubbing cyanosis with positive pedal pulses. Neuro: unable to assess due to mentation.   Lab Results: Basic Metabolic Panel:  Basename 11/19/11 0555 11/18/11 0500  NA 147* 139  K 4.1 5.3*  CL 106 103  CO2 27 24  GLUCOSE 90 139*  BUN 74* 67*  CREATININE 1.63* 1.41*  CALCIUM 8.3* 7.7*  MG -- --  PHOS -- --   Liver Function Tests: No results found for this basename: AST:2,ALT:2,ALKPHOS:2,BILITOT:2,PROT:2,ALBUMIN:2 in the last 72 hours No results found for this basename: LIPASE:2,AMYLASE:2 in the last 72 hours No results found for this basename: AMMONIA:2 in the last 72 hours CBC:  Basename 11/17/11 2023 11/17/11 2009  WBC -- 12.7*  NEUTROABS -- 11.7*  HGB 15.0 12.9  HCT 44.0 41.1  MCV -- 86.5  PLT -- 86*   Cardiac Enzymes:  Basename 11/17/11 2010  CKTOTAL --  CKMB --  CKMBINDEX --  TROPONINI <0.30   BNP: No results found for this basename: PROBNP:3 in the last 72 hours D-Dimer: No results found for  this basename: DDIMER:2 in the last 72 hours CBG:  Basename 11/19/11 0731 11/18/11 2035 11/18/11 1632 11/18/11 1133 11/18/11 0739 11/17/11 2155  GLUCAP 75 89 92 105* 121* 179*   Hemoglobin A1C: No results found for this basename: HGBA1C in the last 72 hours Fasting Lipid Panel: No results found for this basename: CHOL,HDL,LDLCALC,TRIG,CHOLHDL,LDLDIRECT in the last 72 hours Thyroid Function Tests: No results found for this basename: TSH,T4TOTAL,FREET4,T3FREE,THYROIDAB in the last 72 hours Anemia Panel: No results found for this basename: VITAMINB12,FOLATE,FERRITIN,TIBC,IRON,RETICCTPCT in the last 72 hours Coagulation: No results found for this basename: LABPROT:2,INR:2 in the last 72 hours Urine Drug Screen: Drugs of Abuse  No results found for this basename: labopia,  cocainscrnur,  labbenz,  amphetmu,  thcu,  labbarb    Alcohol Level: No results found for this basename: ETH:2 in the last 72 hours Urinalysis:  Basename 11/17/11 2128  COLORURINE YELLOW  LABSPEC 1.014  PHURINE 5.0  GLUCOSEU NEGATIVE  HGBUR SMALL*  BILIRUBINUR NEGATIVE  KETONESUR NEGATIVE  PROTEINUR 30*  UROBILINOGEN 0.2  NITRITE NEGATIVE  LEUKOCYTESUR LARGE*    No results found for this or any previous visit (from the past 240 hour(s)).  Studies/Results: Dg Chest Port 1 View  11/17/2011  *RADIOLOGY REPORT*  Clinical Data: Respiratory distress, shortness of breath.  PORTABLE CHEST - 1 VIEW  Comparison: 10/27/2011  Findings: There is cardiomegaly.  Diffuse bilateral airspace opacities are noted, likely edema.  Small to moderate layering bilateral effusions.  Mitral valve annular calcifications.  No acute bony abnormality.  IMPRESSION: Moderate CHF.  Original Report Authenticated By: Cyndie Chime, M.D.    Medications: Scheduled Meds:    . albuterol  2.5 mg Nebulization TID  . antiseptic oral rinse  15 mL Mouth Rinse q12n4p  . cefTRIAXone (ROCEPHIN)  IV  1 g Intravenous Q24H  . chlorhexidine  15 mL  Mouth Rinse BID  . fluticasone  1 spray Each Nare Daily  . furosemide  40 mg Intravenous Q12H  . ipratropium  0.5 mg Nebulization TID  . sodium polystyrene  15 g Rectal Once  . DISCONTD: digoxin  125 mcg Oral QODAY  . DISCONTD: furosemide  40 mg Intravenous Daily  . DISCONTD: metoprolol tartrate  12.5 mg Oral BID  . DISCONTD: morphine  5 mg Oral Q6H  . DISCONTD: vitamin C  500 mg Oral Daily   Continuous Infusions:  PRN Meds:.acetaminophen, acetaminophen, albuterol-ipratropium, ALPRAZolam, feeding supplement, morphine, ondansetron (ZOFRAN) IV, ondansetron, DISCONTD: morphine  Assessment/Plan: 1. CHF Exacerbation: ECho 5/13 with EF of 30% with mod MS and severe MR, on home hospice Continue IV lasix, change to 60mg  BID I/O's,  roxanol as needed.  Oxygen  Hold BB/Digoxin due to mentation   2. Metabolic encephalopathy:  From 1, UTI NPo till mentation improves  3. UTI: continue IV rocephin, FU urine cultures  4. DM  Continue levamir, SSI  Hold off on diet till mentation improves   3. Thrombocytopenia:  Chronic.  Continue to monitor  No anticoagulants   4. Leukocytosis: could be from UTI  5. Acute on Chronic Renal insufficiency:  Continue to monitor.   6 DVT prophylaxis  scd's  DC tele  Code Status: DNR, was discharged home with hospice, MOST form filled 2 weeks ago Discussed with daughter at bedside, home with hospice 7/5      LOS: 2 days   [facility] Pager: [phone] 11/19/2011, 9:25 AM

## 2011-11-19 NOTE — Progress Notes (Signed)
Room 6736    Patient [facility] [place] [facility] note Patient admitted for aspiration.  Patient under hospice care for CHF.  Patient DNR.  Daughter, Myrene Buddy at bedside, patient uncommunicative.  Daughter unsure as to any discharge planning due to patient's declined status.  Please notify of changes.  Support provided.  Jenna Luo, [place] [facility] [phone]

## 2011-11-19 NOTE — Consult Note (Signed)
WOC ostomy consult  Stoma type/location: LUQ Colostomy Stomal assessment/size: 1 and 3/4 inch budded round stoma; pink Peristomal assessment: intact Treatment options for stomal/peristomal skin: none indicated Output light brown stool Ostomy pouching: 1pc.karaya pouch.  Patient and her family are well known to me from her numerous previous admissions.  Two daughters are independent in her care and express no ostomy needs at this time.  WOC consult Note Reason for Consult:Midline abdominal wound Wound type:surgical Pressure Ulcer POA: No Drainage (amount, consistency, odor) scant, serous Periwound:intact Dressing procedure/placement/frequency:Patient used NPWT at home with Hospice, but unit leaked last night and it was removed.  Daughter requests we use NS continually moist dressings while here in hospital so as not to put her mother through the change over from one unit to another for transfer should she be discharged back to home tomorrow.  I agree with her rationale and advise her to take home unit with her when patient discharged. Daughter expresses no needs at this time; I assisted her in turning and positioning her mother prior to departing. She is very well cared for at home.  I will not follow.  Please re-consult if needed. Thanks, Ladona Mow, MSN, RN, [facility], CWOCN [phone])

## 2011-11-20 LAB — URINE CULTURE

## 2011-11-20 MED ORDER — CEFPODOXIME PROXETIL 100 MG PO TABS: 100.0000 mg | ORAL_TABLET | Freq: Two times a day (BID) | ORAL | Status: AC

## 2011-11-20 MED ORDER — MORPHINE SULFATE (CONCENTRATE) 20 MG/ML PO SOLN: ORAL | Status: AC

## 2011-11-20 MED ORDER — FUROSEMIDE 40 MG PO TABS: 40.0000 mg | ORAL_TABLET | Freq: Two times a day (BID) | ORAL | Status: AC

## 2011-11-20 NOTE — Progress Notes (Signed)
Room 6736 - Wynee Matarazzo -    [facility] RN Visit  Related admission to [facility] diagnosis of CHF.   Pt is DNR code with MOST form present in patient's room.    Pt lying in bed, barely responsive-does open eyes and cough at times.  Does not appear to have symptoms of pain or discomfort.   Dtr Meriam Sprague present and Dr. Jomarie Longs at time of visit.  Pt needs suction in home - ordered with [facility] DME supervisor for prompt delivery prior to patients discharge home today.  Advised on call RN staff pt will need W/D dressing changes and wound vac has been discontinued.   Will fax over discharge info to clinical for medical records.    Please call [facility] @ [phone] with any further hospice needs and at discharge.  Thank you.  Joneen Boers, RN  [facility]  Hospice Liaison

## 2011-11-20 NOTE — Progress Notes (Signed)
This visit was in response to pt's palliative care designation.  Family was at bedside, along with Hospice chaplain.  I introduced myself to both.  Family is being supported by Hospice but if further assistance is needed, please page me. Dellie Catholic  [phone]  Personal pager

## 2011-11-20 NOTE — Clinical Social Work Note (Signed)
Patient medically ready to discharge home today with continued Hospice services. CSW facilitated transport via ambulance. Family at the bedside.  Genelle Bal, MSW, LCSW [phone]  .

## 2011-11-20 NOTE — Progress Notes (Signed)
[facility]) Homecare Chaplain   Patient (pt) [name]  Rm: [id number]  Hospice homecare chaplain visited with pt to assess for spiritual needs.  Pt's dtr was at bedside.  Chaplain provided caregiver support to dtr, and pastoral presence and touch to pt.  Chaplain utilized active listening, normalizing feelings and affirming faith through sacred music and blessing.  Chaplain will follow.  Beverly Isley-Landreth ThM, BCCC [facility] Clinical Chaplain

## 2011-12-02 NOTE — Discharge Summary (Signed)
Physician Discharge Summary  Patient ID: [name] MRN: [id number] DOB/AGE: 12-29-1925 78 y.o.  Admit date: 11/17/2011 Discharge date: 12/02/2011  Primary Care Physician:  Laurena Slimmer, MD  Under care of Hospice and palliative care of [place]  Discharge Diagnoses:   Principal Problem:  *Acute systolic CHF (congestive heart failure)  Moderate Mitral stenosis and Severe mitral regurgitation  Diabetes mellitus  Hypertension  Acute renal failure  Thrombocytopenia  Dyspnea  UTI (lower urinary tract infection)  Encephalopathy  Leukocytosis    Medication List  As of 12/02/2011  9:38 PM   STOP taking these medications         ICAPS Caps      insulin detemir 100 UNIT/ML injection      ipratropium 0.02 % nebulizer solution      OVER THE COUNTER MEDICATION      vitamin C 500 MG tablet         TAKE these medications         albuterol (5 MG/ML) 0.5% nebulizer solution   Commonly known as: PROVENTIL   Take 0.5 mLs (2.5 mg total) by nebulization 3 (three) times daily.      albuterol-ipratropium 18-103 MCG/ACT inhaler   Commonly known as: COMBIVENT   Inhale 2 puffs into the lungs every 6 (six) hours as needed. For shortness of breath      ALPRAZolam 0.25 MG tablet   Commonly known as: XANAX   Take 0.25 mg by mouth 3 (three) times daily as needed. anxiety      digoxin 0.125 MG tablet   Commonly known as: [name]   Take 125 mcg by mouth every other day.      furosemide 40 MG tablet   Commonly known as: LASIX   Take 1 tablet (40 mg total) by mouth 2 (two) times daily.      metoprolol tartrate 25 MG tablet   Commonly known as: LOPRESSOR   Take 12.5 mg by mouth 2 (two) times daily.      mometasone 50 MCG/ACT nasal spray   Commonly known as: NASONEX   Place 2 sprays into the nose at bedtime as needed. For congestion      morphine 20 MG/ML concentrated solution   Commonly known as: ROXANOL   0.25ml (5mg ) sublingual every 2-4 hours as needed for pain or air hunger             Disposition and Follow-up:  Palliative services  Consults: Hospice services of [facility]  Significant Diagnostic Studies:  No results found.  Brief H and P: 78 year old lady recently discharged home with hospice and comfort care, brought in by daughters after an episode where the patient chocked on a pill, and it was followed by worsening shortness of breath. Most of the history was obtained from the family members at bedside, as she is non verbal. The family members deny any fever or chills, . She had two episodes of vomiting over teh last 2 days. Occasional cough on robitussin. No other complaints.   Hospital Course:  1. CHF Exacerbation: ECho 5/13 with EF of 30% with mod MS and severe MR, on home hospice  Treated with IV lasix then transitioned to PO  Continued on betablocker and digoxin roxanol as needed. Oxygen  Discussed with daughters and discharged home under care of hospice, also had MOST form filled in last admission  2. Metabolic encephalopathy:  From 1, UTI  Improved a little   3. UTI: treated with IV rocephin, then transitioned  to PO 4. DM  Continue levamir, SSI  Hold off on diet till mentation improves  3. Thrombocytopenia:  Chronic.  No anticoagulants  4. Leukocytosis: could be from UTI  5. Acute on Chronic Renal insufficiency:   Code Status: DNR, was discharged home with hospice, MOST form filled 2 weeks ago  Discussed with daughter at bedside, home with hospice 7/5  Time spent on Discharge:  Signed: Merl Bommarito [facility]  12/02/2011, 9:38 PM

## 2011-12-17 DEATH — deceased

## 2012-09-19 IMAGING — CR DG PELVIS 1-2V
1 series · 1 of 1 positions shown · non-contrast
Comparison: None.

CLINICAL DATA: Pain after fall

PELVIS - 1-2 VIEW

[view not recorded]
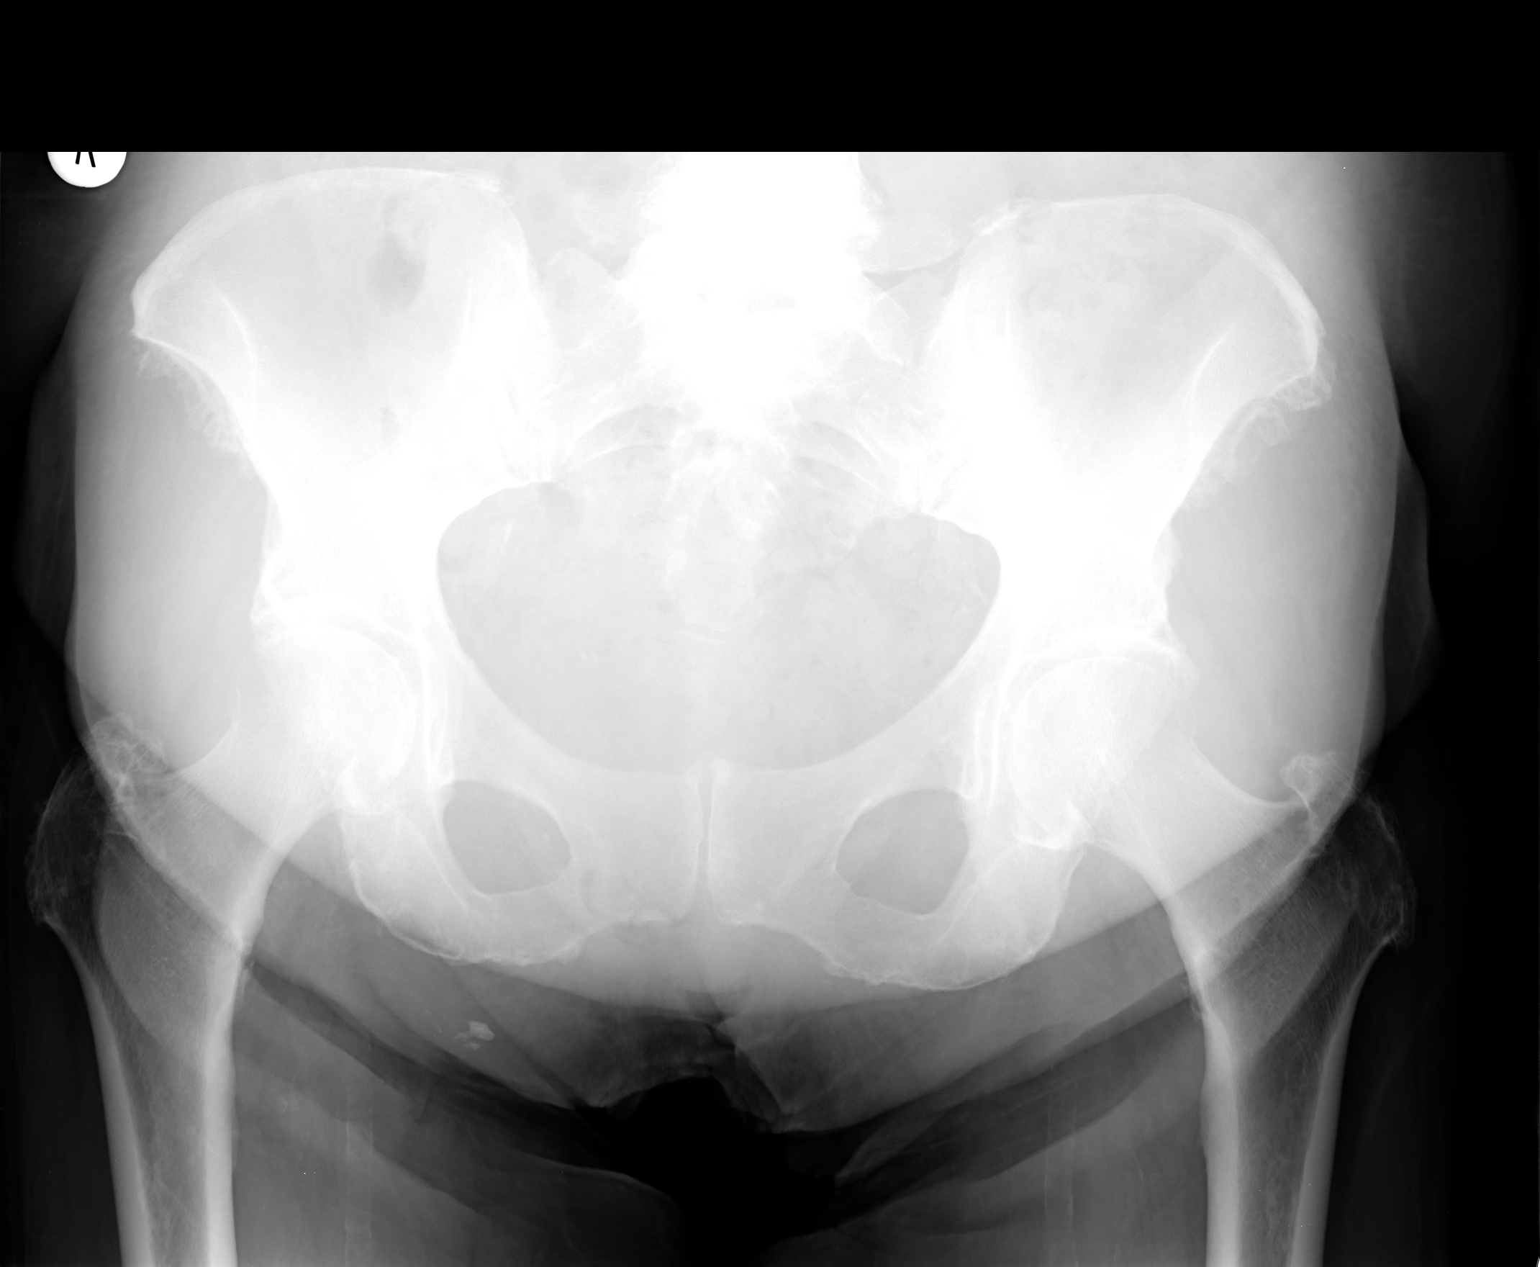

[1 of 1 positions shown; findings below may reference images not displayed]

FINDINGS: There is no evidence of hip or pelvic fracture or
dislocation.  The hip joint compartments are maintained and
symmetric.  The femoral heads are well rounded.
IMPRESSION: There is no evidence of acute abnormality.

## 2012-09-24 IMAGING — NM NM GI BLOOD LOSS
1 series · 6 of 6 positions shown · non-contrast
Comparison: None

CLINICAL DATA: 85-year-old female with GI bleed.

NUCLEAR MEDICINE GASTROINTESTINAL BLEEDING STUDY
TECHNIQUE: Sequential abdominal images were obtained following
intravenous administration of Ec-OOm labeled red blood cells.
Radiopharmaceutical: HD.VUTVVT KOSE ULTRATAG TECHNETIUM TC 99M-
LABELED RED BLOOD CELLS IV KIT

[Series 1: gi bleed · 4.46mm/px · 6 of 60 frames shown]
[frame 6/60]
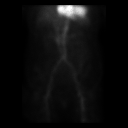
[frame 16/60]
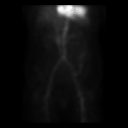
[frame 26/60]
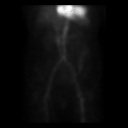
[frame 36/60]
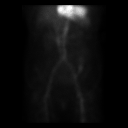
[frame 46/60]
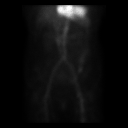
[frame 56/60]
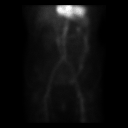

[6 of 6 positions shown; findings below may reference images not displayed]

FINDINGS: Activity identified within the left colon is noted,
compatible with GI bleed.  The source of this GI bleed likely
originates in the descending colon.
No other abnormalities are present.
IMPRESSION: Active GI bleeding from the left/descending colon.

## 2012-12-15 IMAGING — CR DG CERVICAL SPINE COMPLETE 4+V
6 series · 6 of 6 positions shown · non-contrast
Comparison: None.

CLINICAL DATA: Left neck pain status post fall.

CERVICAL SPINE - COMPLETE 4+ VIEW

[t cervical spine odontoid]
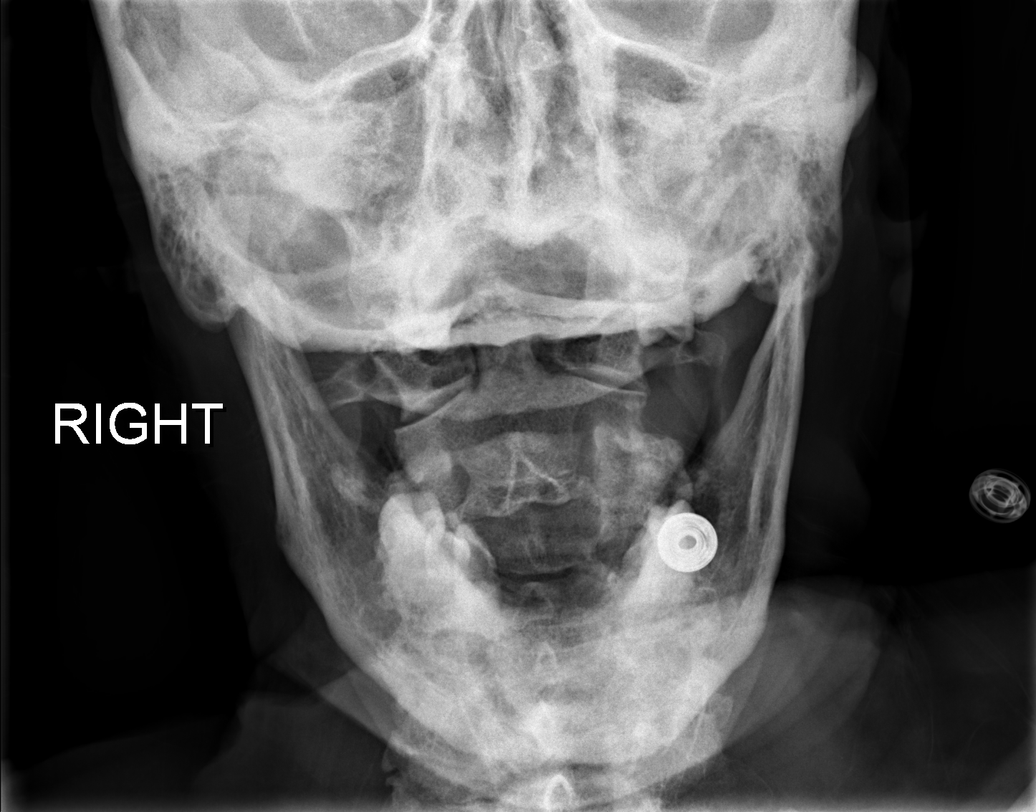

[t cervical spine ap]
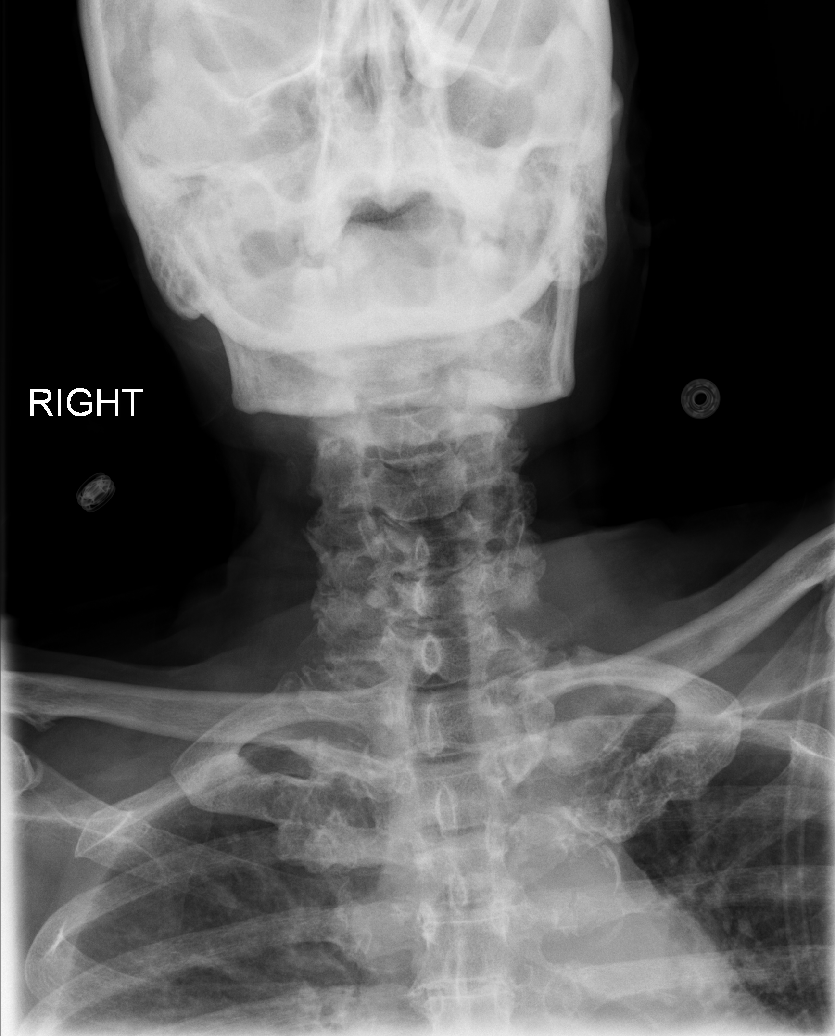

[w cervical spine lat]
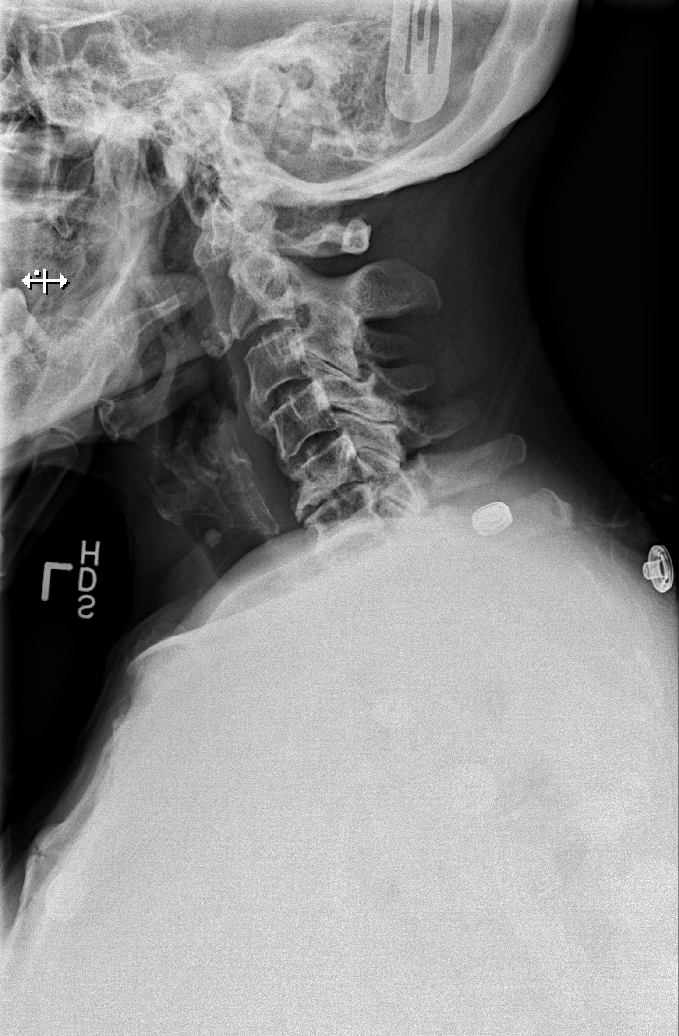

[w cervical spine ap_obl (1 of 2)]
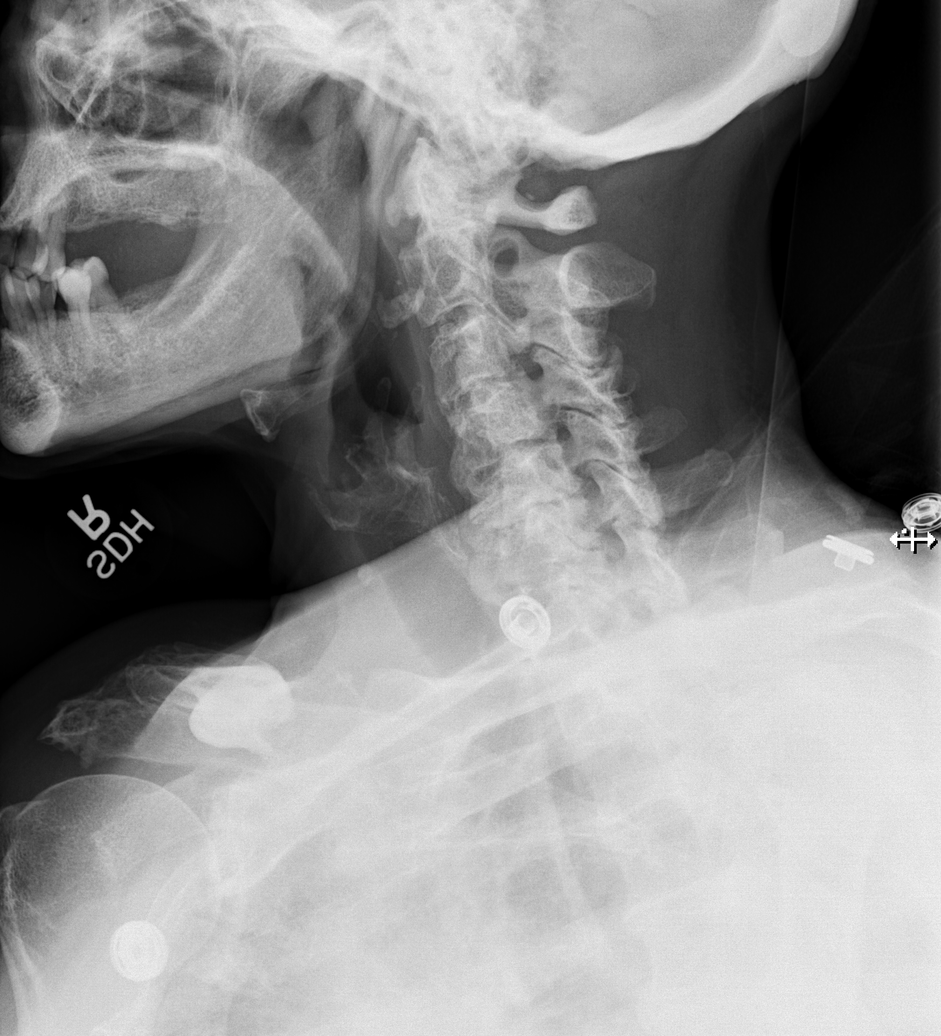

[w cervical spine ap_obl (2 of 2)]
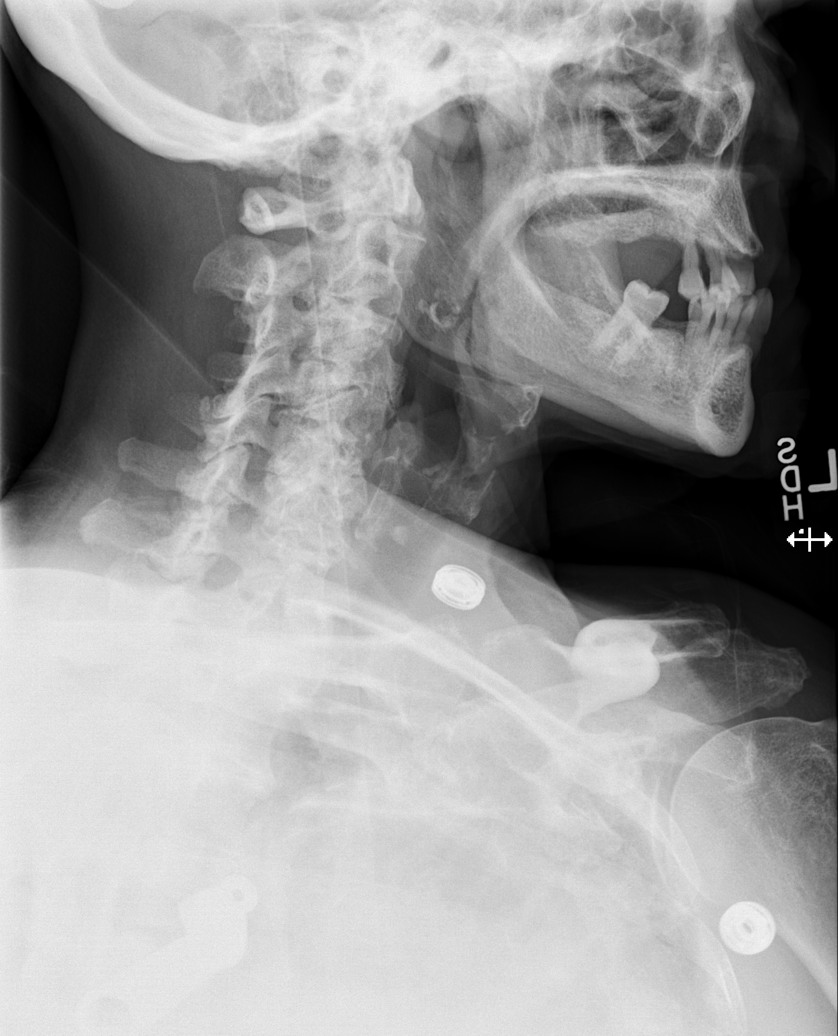

[w cervical swimmers]
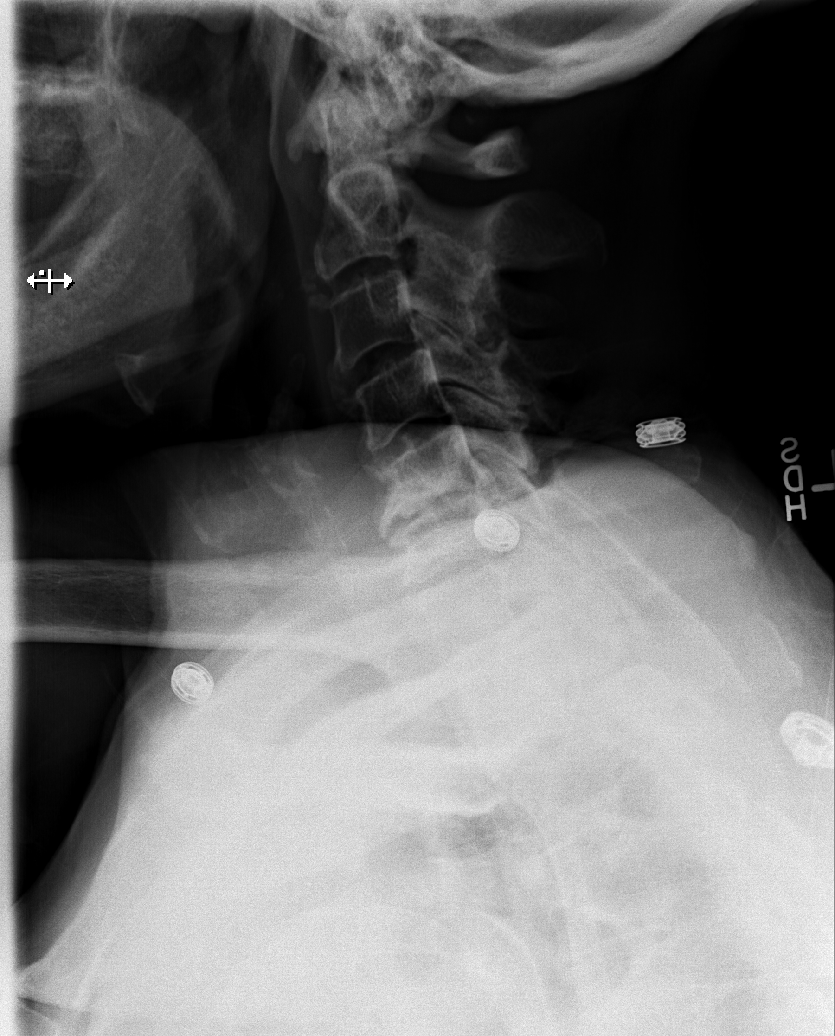

[6 of 6 positions shown; findings below may reference images not displayed]

FINDINGS: Advanced multilevel degenerative changes with multilevel
facet arthropathy and degenerative disc disease most pronounced at
C5-6.  There is mild vertebral body height loss at C5. Otherwise
maintained vertebral body height and alignment.  No prevertebral
soft tissue swelling.  Maintained cranial cervical and C1-2
articulation, though the odontoid view is slightly limited by
rotation.  Lung apices are clear.  Atherosclerotic calcification of
the aortic arch.
IMPRESSION: Advanced multilevel degenerative changes.  Mild vertebral body
height loss at C5 is age indeterminate though favored remote.
Correlate with point tenderness.
